# Patient Record
Sex: Male | Born: 1945
Health system: Southern US, Community
[De-identification: ages and names within clinical notes are randomized; demographics above are authoritative.]

## PROBLEM LIST (undated history)

## (undated) DIAGNOSIS — R569 Unspecified convulsions: Secondary | ICD-10-CM

## (undated) DIAGNOSIS — I2510 Atherosclerotic heart disease of native coronary artery without angina pectoris: Secondary | ICD-10-CM

## (undated) DIAGNOSIS — E7800 Pure hypercholesterolemia, unspecified: Secondary | ICD-10-CM

## (undated) DIAGNOSIS — M5030 Other cervical disc degeneration, unspecified cervical region: Secondary | ICD-10-CM

## (undated) DIAGNOSIS — B54 Unspecified malaria: Secondary | ICD-10-CM

## (undated) DIAGNOSIS — F4310 Post-traumatic stress disorder, unspecified: Secondary | ICD-10-CM

## (undated) DIAGNOSIS — Z95820 Peripheral vascular angioplasty status with implants and grafts: Secondary | ICD-10-CM

## (undated) DIAGNOSIS — I10 Essential (primary) hypertension: Secondary | ICD-10-CM

## (undated) DIAGNOSIS — I209 Angina pectoris, unspecified: Secondary | ICD-10-CM

## (undated) DIAGNOSIS — I214 Non-ST elevation (NSTEMI) myocardial infarction: Secondary | ICD-10-CM

## (undated) DIAGNOSIS — G450 Vertebro-basilar artery syndrome: Secondary | ICD-10-CM

## (undated) DIAGNOSIS — M1990 Unspecified osteoarthritis, unspecified site: Secondary | ICD-10-CM

## (undated) HISTORY — DX: Peripheral vascular angioplasty status with implants and grafts: Z95.820

## (undated) HISTORY — PX: TONSILLECTOMY: SUR1361

## (undated) HISTORY — PX: LUMBAR DISC SURGERY: SHX700

## (undated) HISTORY — PX: BACK SURGERY: SHX140

---

## 1989-07-07 HISTORY — PX: CATARACT EXTRACTION W/ INTRAOCULAR LENS  IMPLANT, BILATERAL: SHX1307

## 2005-06-28 ENCOUNTER — Emergency Department (HOSPITAL_COMMUNITY): Admission: EM | Admit: 2005-06-28 | Discharge: 2005-06-28 | Payer: Self-pay | Admitting: Emergency Medicine

## 2011-11-07 DIAGNOSIS — I214 Non-ST elevation (NSTEMI) myocardial infarction: Secondary | ICD-10-CM

## 2011-11-07 HISTORY — PX: CARDIAC CATHETERIZATION: SHX172

## 2011-11-07 HISTORY — DX: Non-ST elevation (NSTEMI) myocardial infarction: I21.4

## 2011-12-02 ENCOUNTER — Encounter (HOSPITAL_COMMUNITY): Payer: Self-pay

## 2011-12-02 ENCOUNTER — Emergency Department (HOSPITAL_COMMUNITY): Payer: Non-veteran care

## 2011-12-02 ENCOUNTER — Inpatient Hospital Stay (HOSPITAL_COMMUNITY): Payer: Non-veteran care

## 2011-12-02 ENCOUNTER — Encounter (HOSPITAL_COMMUNITY)
Admission: EM | Disposition: A | Discharge status: Home / Self Care | Payer: Self-pay | Source: Home / Self Care | Attending: Surgery

## 2011-12-02 ENCOUNTER — Other Ambulatory Visit: Payer: Self-pay

## 2011-12-02 ENCOUNTER — Inpatient Hospital Stay (HOSPITAL_COMMUNITY)
Admission: EM | Admit: 2011-12-02 | Discharge: 2011-12-06 | DRG: 234 | Disposition: A | Discharge status: Home / Self Care | Payer: Non-veteran care | Attending: Surgery | Admitting: Surgery

## 2011-12-02 ENCOUNTER — Encounter (HOSPITAL_COMMUNITY): Payer: Self-pay | Admitting: Emergency Medicine

## 2011-12-02 DIAGNOSIS — I214 Non-ST elevation (NSTEMI) myocardial infarction: Principal | ICD-10-CM | POA: Diagnosis present

## 2011-12-02 DIAGNOSIS — I219 Acute myocardial infarction, unspecified: Secondary | ICD-10-CM | POA: Diagnosis present

## 2011-12-02 DIAGNOSIS — I2510 Atherosclerotic heart disease of native coronary artery without angina pectoris: Secondary | ICD-10-CM | POA: Diagnosis present

## 2011-12-02 DIAGNOSIS — F17200 Nicotine dependence, unspecified, uncomplicated: Secondary | ICD-10-CM | POA: Diagnosis present

## 2011-12-02 DIAGNOSIS — R9431 Abnormal electrocardiogram [ECG] [EKG]: Secondary | ICD-10-CM | POA: Diagnosis present

## 2011-12-02 DIAGNOSIS — E8779 Other fluid overload: Secondary | ICD-10-CM | POA: Diagnosis not present

## 2011-12-02 DIAGNOSIS — Z87891 Personal history of nicotine dependence: Secondary | ICD-10-CM | POA: Diagnosis present

## 2011-12-02 DIAGNOSIS — E119 Type 2 diabetes mellitus without complications: Secondary | ICD-10-CM | POA: Diagnosis present

## 2011-12-02 DIAGNOSIS — E7800 Pure hypercholesterolemia, unspecified: Secondary | ICD-10-CM | POA: Diagnosis present

## 2011-12-02 DIAGNOSIS — Z79899 Other long term (current) drug therapy: Secondary | ICD-10-CM

## 2011-12-02 DIAGNOSIS — I10 Essential (primary) hypertension: Secondary | ICD-10-CM | POA: Diagnosis present

## 2011-12-02 DIAGNOSIS — E876 Hypokalemia: Secondary | ICD-10-CM | POA: Diagnosis not present

## 2011-12-02 DIAGNOSIS — D696 Thrombocytopenia, unspecified: Secondary | ICD-10-CM | POA: Diagnosis not present

## 2011-12-02 DIAGNOSIS — Z7982 Long term (current) use of aspirin: Secondary | ICD-10-CM

## 2011-12-02 DIAGNOSIS — I428 Other cardiomyopathies: Secondary | ICD-10-CM | POA: Diagnosis present

## 2011-12-02 DIAGNOSIS — I200 Unstable angina: Secondary | ICD-10-CM | POA: Diagnosis present

## 2011-12-02 DIAGNOSIS — I255 Ischemic cardiomyopathy: Secondary | ICD-10-CM | POA: Diagnosis present

## 2011-12-02 DIAGNOSIS — D62 Acute posthemorrhagic anemia: Secondary | ICD-10-CM | POA: Diagnosis not present

## 2011-12-02 DIAGNOSIS — Z951 Presence of aortocoronary bypass graft: Secondary | ICD-10-CM

## 2011-12-02 HISTORY — DX: Pure hypercholesterolemia, unspecified: E78.00

## 2011-12-02 HISTORY — PX: CORONARY ARTERY BYPASS GRAFT: SHX141

## 2011-12-02 HISTORY — PX: LEFT HEART CATHETERIZATION WITH CORONARY ANGIOGRAM: SHX5451

## 2011-12-02 HISTORY — DX: Essential (primary) hypertension: I10

## 2011-12-02 LAB — POCT I-STAT 4, (NA,K, GLUC, HGB,HCT)
Glucose, Bld: 122 mg/dL — ABNORMAL HIGH (ref 70–99)
Glucose, Bld: 120 mg/dL — ABNORMAL HIGH (ref 70–99)
Glucose, Bld: 142 mg/dL — ABNORMAL HIGH (ref 70–99)
HCT: 36 % — ABNORMAL LOW (ref 39.0–52.0)
HCT: 26 % — ABNORMAL LOW (ref 39.0–52.0)
HCT: 25 % — ABNORMAL LOW (ref 39.0–52.0)
HCT: 33 % — ABNORMAL LOW (ref 39.0–52.0)
Hemoglobin: 8.8 g/dL — ABNORMAL LOW (ref 13.0–17.0)
Hemoglobin: 8.5 g/dL — ABNORMAL LOW (ref 13.0–17.0)
Hemoglobin: 11.2 g/dL — ABNORMAL LOW (ref 13.0–17.0)
Potassium: 2.9 mEq/L — ABNORMAL LOW (ref 3.5–5.1)
Potassium: 2.8 mEq/L — ABNORMAL LOW (ref 3.5–5.1)
Potassium: 3.3 mEq/L — ABNORMAL LOW (ref 3.5–5.1)
Potassium: 3.7 mEq/L (ref 3.5–5.1)
Sodium: 138 mEq/L (ref 135–145)
Sodium: 140 mEq/L (ref 135–145)
Sodium: 140 mEq/L (ref 135–145)

## 2011-12-02 LAB — POCT I-STAT, CHEM 8
BUN: 26 mg/dL — ABNORMAL HIGH (ref 6–23)
BUN: 18 mg/dL (ref 6–23)
Chloride: 109 mEq/L (ref 96–112)
Chloride: 111 mEq/L (ref 96–112)
Creatinine, Ser: 1 mg/dL (ref 0.50–1.35)
Creatinine, Ser: 1 mg/dL (ref 0.50–1.35)
Creatinine, Ser: 0.8 mg/dL (ref 0.50–1.35)
Glucose, Bld: 118 mg/dL — ABNORMAL HIGH (ref 70–99)
Glucose, Bld: 126 mg/dL — ABNORMAL HIGH (ref 70–99)
HCT: 40 % (ref 39.0–52.0)
Hemoglobin: 13.6 g/dL (ref 13.0–17.0)
Potassium: 2.9 mEq/L — ABNORMAL LOW (ref 3.5–5.1)
Potassium: 2.9 mEq/L — ABNORMAL LOW (ref 3.5–5.1)
Potassium: 3.5 mEq/L (ref 3.5–5.1)
Sodium: 142 mEq/L (ref 135–145)
Sodium: 143 mEq/L (ref 135–145)

## 2011-12-02 LAB — CBC
HCT: 37.3 % — ABNORMAL LOW (ref 39.0–52.0)
MCH: 30.7 pg (ref 26.0–34.0)
MCH: 30.1 pg (ref 26.0–34.0)
MCHC: 36.5 g/dL — ABNORMAL HIGH (ref 30.0–36.0)
MCHC: 35.5 g/dL (ref 30.0–36.0)
MCHC: 34.8 g/dL (ref 30.0–36.0)
MCV: 86.7 fL (ref 78.0–100.0)
MCV: 86.6 fL (ref 78.0–100.0)
Platelets: 183 10*3/uL (ref 150–400)
Platelets: 123 10*3/uL — ABNORMAL LOW (ref 150–400)
RBC: 3.52 MIL/uL — ABNORMAL LOW (ref 4.22–5.81)
RDW: 14.9 % (ref 11.5–15.5)
RDW: 14.8 % (ref 11.5–15.5)
RDW: 14.9 % (ref 11.5–15.5)
WBC: 12 10*3/uL — ABNORMAL HIGH (ref 4.0–10.5)

## 2011-12-02 LAB — GLUCOSE, CAPILLARY
Glucose-Capillary: 110 mg/dL — ABNORMAL HIGH (ref 70–99)
Glucose-Capillary: 115 mg/dL — ABNORMAL HIGH (ref 70–99)

## 2011-12-02 LAB — POCT ACTIVATED CLOTTING TIME
Activated Clotting Time: 166 seconds
Activated Clotting Time: 237 seconds

## 2011-12-02 LAB — COMPREHENSIVE METABOLIC PANEL
ALT: 19 U/L (ref 0–53)
AST: 44 U/L — ABNORMAL HIGH (ref 0–37)
Alkaline Phosphatase: 76 U/L (ref 39–117)
GFR calc non Af Amer: 72 mL/min — ABNORMAL LOW (ref >90)
Sodium: 142 mEq/L (ref 135–145)
Total Bilirubin: 0.4 mg/dL (ref 0.3–1.2)

## 2011-12-02 LAB — DIFFERENTIAL
Basophils Absolute: 0 10*3/uL (ref 0.0–0.1)
Lymphocytes Relative: 16 % (ref 12–46)
Lymphs Abs: 1.9 10*3/uL (ref 0.7–4.0)
Neutro Abs: 9.4 10*3/uL — ABNORMAL HIGH (ref 1.7–7.7)

## 2011-12-02 LAB — PROTIME-INR
INR: 1.16 (ref 0.00–1.49)
INR: 1.5 — ABNORMAL HIGH (ref 0.00–1.49)
Prothrombin Time: 15 seconds (ref 11.6–15.2)
Prothrombin Time: 18.4 seconds — ABNORMAL HIGH (ref 11.6–15.2)

## 2011-12-02 LAB — PREPARE RBC (CROSSMATCH)

## 2011-12-02 LAB — POCT I-STAT TROPONIN I

## 2011-12-02 LAB — CREATININE, SERUM
Creatinine, Ser: 0.99 mg/dL (ref 0.50–1.35)
GFR calc non Af Amer: 85 mL/min — ABNORMAL LOW (ref >90)

## 2011-12-02 LAB — POCT I-STAT 3, ART BLOOD GAS (G3+)
Acid-base deficit: 3 mmol/L — ABNORMAL HIGH (ref 0.0–2.0)
Bicarbonate: 20.4 mEq/L (ref 20.0–24.0)
pCO2 arterial: 31.3 mmHg — ABNORMAL LOW (ref 35.0–45.0)
pO2, Arterial: 300 mmHg — ABNORMAL HIGH (ref 80.0–100.0)
pO2, Arterial: 89 mmHg (ref 80.0–100.0)

## 2011-12-02 LAB — PLATELET COUNT: Platelets: 107 10*3/uL — ABNORMAL LOW (ref 150–400)

## 2011-12-02 LAB — HEMOGLOBIN AND HEMATOCRIT, BLOOD: Hemoglobin: 7.8 g/dL — ABNORMAL LOW (ref 13.0–17.0)

## 2011-12-02 LAB — ABO/RH: ABO/RH(D): O POS

## 2011-12-02 SURGERY — CORONARY ARTERY BYPASS GRAFTING (CABG)
Anesthesia: General | Site: Chest | Wound class: Clean

## 2011-12-02 SURGERY — LEFT HEART CATHETERIZATION WITH CORONARY ANGIOGRAM
Anesthesia: LOCAL

## 2011-12-02 MED ORDER — ACETAMINOPHEN 325 MG PO TABS: 650.0000 mg | ORAL_TABLET | ORAL | Status: DC | PRN

## 2011-12-02 MED ORDER — DEXTROSE 5 % IV SOLN
1.5000 g | Freq: Two times a day (BID) | INTRAVENOUS | Status: DC
  Administered 2011-12-03 (×3): 1.5 g via INTRAVENOUS
  Filled 2011-12-02 (×4): qty 1.5

## 2011-12-02 MED ORDER — ETOMIDATE 2 MG/ML IV SOLN
INTRAVENOUS | Status: DC | PRN
  Administered 2011-12-02: 20 mg via INTRAVENOUS

## 2011-12-02 MED ORDER — DOPAMINE-DEXTROSE 3.2-5 MG/ML-% IV SOLN
2.0000 ug/kg/min | INTRAVENOUS | Status: DC
  Filled 2011-12-02: qty 250

## 2011-12-02 MED ORDER — OXYCODONE HCL 5 MG PO TABS
5.0000 mg | ORAL_TABLET | ORAL | Status: DC | PRN
Start: 2011-12-02 — End: 2011-12-04
  Administered 2011-12-03 (×2): 10 mg via ORAL
  Filled 2011-12-02 (×2): qty 2

## 2011-12-02 MED ORDER — DEXTROSE 5 % IV SOLN
750.0000 mg | INTRAVENOUS | Status: DC
  Filled 2011-12-02: qty 750

## 2011-12-02 MED ORDER — NITROGLYCERIN IN D5W 200-5 MCG/ML-% IV SOLN
2.0000 ug/min | INTRAVENOUS | Status: DC
  Filled 2011-12-02: qty 250

## 2011-12-02 MED ORDER — ONDANSETRON HCL 4 MG/2ML IJ SOLN
INTRAMUSCULAR | Status: AC
  Filled 2011-12-02: qty 2

## 2011-12-02 MED ORDER — ACETAMINOPHEN 650 MG RE SUPP: 650.0000 mg | RECTAL | Status: DC

## 2011-12-02 MED ORDER — METOPROLOL TARTRATE 12.5 MG HALF TABLET
12.5000 mg | ORAL_TABLET | Freq: Two times a day (BID) | ORAL | Status: DC
  Filled 2011-12-02 (×3): qty 1

## 2011-12-02 MED ORDER — AMINOCAPROIC ACID 250 MG/ML IV SOLN: INTRAVENOUS | Status: DC | PRN

## 2011-12-02 MED ORDER — BISACODYL 5 MG PO TBEC
10.0000 mg | DELAYED_RELEASE_TABLET | Freq: Every day | ORAL | Status: DC
  Administered 2011-12-04: 10 mg via ORAL
  Filled 2011-12-02: qty 2

## 2011-12-02 MED ORDER — INSULIN ASPART 100 UNIT/ML ~~LOC~~ SOLN: 0.0000 [IU] | SUBCUTANEOUS | Status: DC

## 2011-12-02 MED ORDER — HEPARIN SOD (PORCINE) IN D5W 100 UNIT/ML IV SOLN
1000.0000 [IU]/h | INTRAVENOUS | Status: DC
  Administered 2011-12-02: 1000 [IU]/h via INTRAVENOUS
  Filled 2011-12-02: qty 250

## 2011-12-02 MED ORDER — SODIUM CHLORIDE 0.9 % IV SOLN
10.0000 g | INTRAVENOUS | Status: DC | PRN
  Administered 2011-12-02: 5 g/h via INTRAVENOUS

## 2011-12-02 MED ORDER — VANCOMYCIN HCL 1000 MG IV SOLR
1000.0000 mg | INTRAVENOUS | Status: DC | PRN
  Administered 2011-12-02: 1500 mg via INTRAVENOUS

## 2011-12-02 MED ORDER — SODIUM CHLORIDE 0.9 % IV SOLN
INTRAVENOUS | Status: DC
  Administered 2011-12-02: 09:00:00 via INTRAVENOUS

## 2011-12-02 MED ORDER — NOREPINEPHRINE BITARTRATE 1 MG/ML IJ SOLN
2.0000 ug/min | INTRAVENOUS | Status: DC
  Administered 2011-12-03: 20 ug/min via INTRAVENOUS
  Filled 2011-12-02: qty 4

## 2011-12-02 MED ORDER — NITROGLYCERIN 0.2 MG/ML ON CALL CATH LAB
INTRAVENOUS | Status: AC
  Filled 2011-12-02: qty 1

## 2011-12-02 MED ORDER — 0.9 % SODIUM CHLORIDE (POUR BTL) OPTIME
TOPICAL | Status: DC | PRN
  Administered 2011-12-02: 5000 mL

## 2011-12-02 MED ORDER — PROTAMINE SULFATE 10 MG/ML IV SOLN
INTRAVENOUS | Status: DC | PRN
  Administered 2011-12-02: 300 mg via INTRAVENOUS

## 2011-12-02 MED ORDER — ALBUMIN HUMAN 5 % IV SOLN
INTRAVENOUS | Status: DC | PRN
  Administered 2011-12-02: 16:00:00 via INTRAVENOUS

## 2011-12-02 MED ORDER — HEPARIN SODIUM (PORCINE) 5000 UNIT/ML IJ SOLN
INTRAMUSCULAR | Status: AC
  Administered 2011-12-02: 5000 [IU]
  Filled 2011-12-02: qty 1

## 2011-12-02 MED ORDER — LACTATED RINGERS IV SOLN
INTRAVENOUS | Status: DC
  Administered 2011-12-02: 18:00:00 via INTRAVENOUS

## 2011-12-02 MED ORDER — ASPIRIN 81 MG PO CHEW: 324.0000 mg | CHEWABLE_TABLET | Freq: Every day | ORAL | Status: DC

## 2011-12-02 MED ORDER — PANTOPRAZOLE SODIUM 40 MG PO TBEC
40.0000 mg | DELAYED_RELEASE_TABLET | Freq: Every day | ORAL | Status: DC
  Administered 2011-12-04 – 2011-12-05 (×2): 40 mg via ORAL
  Filled 2011-12-02 (×2): qty 1

## 2011-12-02 MED ORDER — POTASSIUM CHLORIDE 10 MEQ/100ML IV SOLN
10.0000 meq | Freq: Once | INTRAVENOUS | Status: AC
  Administered 2011-12-02: 10 meq via INTRAVENOUS
  Filled 2011-12-02: qty 100

## 2011-12-02 MED ORDER — DOCUSATE SODIUM 100 MG PO CAPS
200.0000 mg | ORAL_CAPSULE | Freq: Every day | ORAL | Status: DC
  Administered 2011-12-04: 200 mg via ORAL
  Filled 2011-12-02 (×3): qty 2

## 2011-12-02 MED ORDER — MORPHINE SULFATE 2 MG/ML IJ SOLN
2.0000 mg | INTRAMUSCULAR | Status: DC | PRN
  Administered 2011-12-02: 2 mg via INTRAVENOUS
  Filled 2011-12-02: qty 1

## 2011-12-02 MED ORDER — EPINEPHRINE HCL 1 MG/ML IJ SOLN
0.5000 ug/min | INTRAMUSCULAR | Status: DC
  Filled 2011-12-02: qty 4

## 2011-12-02 MED ORDER — ACETAMINOPHEN 500 MG PO TABS
1000.0000 mg | ORAL_TABLET | Freq: Four times a day (QID) | ORAL | Status: DC
  Administered 2011-12-03 – 2011-12-04 (×4): 1000 mg via ORAL
  Filled 2011-12-02 (×8): qty 2
  Filled 2011-12-02: qty 1

## 2011-12-02 MED ORDER — SODIUM CHLORIDE 0.9 % IV SOLN
200.0000 ug | INTRAVENOUS | Status: DC | PRN
  Administered 2011-12-02: 0.3 ug/kg/h via INTRAVENOUS

## 2011-12-02 MED ORDER — ACETAMINOPHEN 160 MG/5ML PO SOLN
975.0000 mg | Freq: Four times a day (QID) | ORAL | Status: DC
  Filled 2011-12-02: qty 40.6

## 2011-12-02 MED ORDER — POTASSIUM CHLORIDE 10 MEQ/50ML IV SOLN
10.0000 meq | INTRAVENOUS | Status: AC
  Administered 2011-12-02 (×3): 10 meq via INTRAVENOUS

## 2011-12-02 MED ORDER — SODIUM CHLORIDE 0.9 % IV SOLN
0.1000 ug/kg/h | INTRAVENOUS | Status: DC
  Filled 2011-12-02: qty 4

## 2011-12-02 MED ORDER — ACETAMINOPHEN 160 MG/5ML PO SOLN: 650.0000 mg | ORAL | Status: DC

## 2011-12-02 MED ORDER — MORPHINE SULFATE 4 MG/ML IJ SOLN
2.0000 mg | INTRAMUSCULAR | Status: DC | PRN
  Administered 2011-12-03 (×4): 4 mg via INTRAVENOUS
  Filled 2011-12-02 (×4): qty 1

## 2011-12-02 MED ORDER — ASPIRIN 81 MG PO CHEW
324.0000 mg | CHEWABLE_TABLET | Freq: Once | ORAL | Status: AC
  Administered 2011-12-02: 324 mg via ORAL
  Filled 2011-12-02: qty 4

## 2011-12-02 MED ORDER — NITROGLYCERIN IN D5W 200-5 MCG/ML-% IV SOLN
INTRAVENOUS | Status: DC | PRN
  Administered 2011-12-02: 16.6 ug/min via INTRAVENOUS

## 2011-12-02 MED ORDER — FAMOTIDINE IN NACL 20-0.9 MG/50ML-% IV SOLN
20.0000 mg | Freq: Two times a day (BID) | INTRAVENOUS | Status: AC
  Administered 2011-12-02 (×2): 20 mg via INTRAVENOUS
  Filled 2011-12-02: qty 50

## 2011-12-02 MED ORDER — MIDAZOLAM HCL 5 MG/5ML IJ SOLN
INTRAMUSCULAR | Status: DC | PRN
  Administered 2011-12-02: 2 mg via INTRAVENOUS
  Administered 2011-12-02: 1 mg via INTRAVENOUS
  Administered 2011-12-02: 5 mg via INTRAVENOUS
  Administered 2011-12-02: 2 mg via INTRAVENOUS

## 2011-12-02 MED ORDER — SODIUM CHLORIDE 0.9 % IJ SOLN: 3.0000 mL | INTRAMUSCULAR | Status: DC | PRN

## 2011-12-02 MED ORDER — POTASSIUM CHLORIDE 2 MEQ/ML IV SOLN
80.0000 meq | INTRAVENOUS | Status: DC
  Filled 2011-12-02: qty 40

## 2011-12-02 MED ORDER — ASPIRIN EC 325 MG PO TBEC
325.0000 mg | DELAYED_RELEASE_TABLET | Freq: Every day | ORAL | Status: DC
  Administered 2011-12-04 – 2011-12-06 (×3): 325 mg via ORAL
  Filled 2011-12-02 (×4): qty 1

## 2011-12-02 MED ORDER — PHENYLEPHRINE HCL 10 MG/ML IJ SOLN
10.0000 mg | INTRAVENOUS | Status: DC | PRN
  Administered 2011-12-02: 25 ug/min via INTRAVENOUS

## 2011-12-02 MED ORDER — HEPARIN (PORCINE) IN NACL 2-0.9 UNIT/ML-% IJ SOLN
INTRAMUSCULAR | Status: AC
  Filled 2011-12-02: qty 1000

## 2011-12-02 MED ORDER — SODIUM CHLORIDE 0.9 % IV BOLUS (SEPSIS)
250.0000 mL | Freq: Once | INTRAVENOUS | Status: AC
  Administered 2011-12-02: 250 mL via INTRAVENOUS

## 2011-12-02 MED ORDER — METOPROLOL TARTRATE 1 MG/ML IV SOLN: 2.5000 mg | INTRAVENOUS | Status: DC | PRN

## 2011-12-02 MED ORDER — POTASSIUM CHLORIDE 10 MEQ/50ML IV SOLN
INTRAVENOUS | Status: AC
  Administered 2011-12-03: 10 meq
  Filled 2011-12-02: qty 50

## 2011-12-02 MED ORDER — HEPARIN SODIUM (PORCINE) 1000 UNIT/ML IJ SOLN
5000.0000 [IU] | Freq: Once | INTRAMUSCULAR | Status: AC
  Filled 2011-12-02: qty 5

## 2011-12-02 MED ORDER — MORPHINE SULFATE 2 MG/ML IJ SOLN: 1.0000 mg | INTRAMUSCULAR | Status: DC | PRN

## 2011-12-02 MED ORDER — INSULIN REGULAR BOLUS VIA INFUSION: 0.0000 [IU] | Freq: Three times a day (TID) | INTRAVENOUS | Status: DC

## 2011-12-02 MED ORDER — LACTATED RINGERS IV SOLN
INTRAVENOUS | Status: DC | PRN
  Administered 2011-12-02: 12:00:00 via INTRAVENOUS

## 2011-12-02 MED ORDER — SODIUM CHLORIDE 0.9 % IV SOLN: INTRAVENOUS | Status: AC

## 2011-12-02 MED ORDER — PHENYLEPHRINE HCL 10 MG/ML IJ SOLN
30.0000 ug/min | INTRAVENOUS | Status: DC
  Filled 2011-12-02: qty 2

## 2011-12-02 MED ORDER — DEXTROSE 5 % IV SOLN
1.5000 g | INTRAVENOUS | Status: DC
  Filled 2011-12-02: qty 1.5

## 2011-12-02 MED ORDER — ONDANSETRON HCL 4 MG/2ML IJ SOLN
4.0000 mg | INTRAMUSCULAR | Status: DC | PRN
  Administered 2011-12-02: 4 mg via INTRAVENOUS
  Filled 2011-12-02: qty 2

## 2011-12-02 MED ORDER — VERAPAMIL HCL 2.5 MG/ML IV SOLN
INTRAVENOUS | Status: DC | PRN
  Administered 2011-12-02: 15:00:00

## 2011-12-02 MED ORDER — ONDANSETRON HCL 4 MG/2ML IJ SOLN: 4.0000 mg | Freq: Four times a day (QID) | INTRAMUSCULAR | Status: DC | PRN

## 2011-12-02 MED ORDER — MORPHINE SULFATE 2 MG/ML IJ SOLN
1.0000 mg | INTRAMUSCULAR | Status: AC | PRN
  Administered 2011-12-03: 2 mg via INTRAVENOUS
  Filled 2011-12-02: qty 2

## 2011-12-02 MED ORDER — ONDANSETRON HCL 4 MG/2ML IJ SOLN
INTRAMUSCULAR | Status: DC | PRN
  Administered 2011-12-02 (×2): 4 mg via INTRAVENOUS

## 2011-12-02 MED ORDER — BISACODYL 10 MG RE SUPP: 10.0000 mg | Freq: Every day | RECTAL | Status: DC

## 2011-12-02 MED ORDER — LIDOCAINE HCL (PF) 1 % IJ SOLN
INTRAMUSCULAR | Status: AC
  Filled 2011-12-02: qty 30

## 2011-12-02 MED ORDER — NITROGLYCERIN IN D5W 200-5 MCG/ML-% IV SOLN: 0.0000 ug/min | INTRAVENOUS | Status: DC

## 2011-12-02 MED ORDER — FENTANYL CITRATE 0.05 MG/ML IJ SOLN
INTRAMUSCULAR | Status: AC
  Filled 2011-12-02: qty 2

## 2011-12-02 MED ORDER — MIDAZOLAM HCL 2 MG/2ML IJ SOLN
2.0000 mg | INTRAMUSCULAR | Status: DC | PRN
  Administered 2011-12-02 – 2011-12-03 (×2): 2 mg via INTRAVENOUS
  Filled 2011-12-02 (×3): qty 2

## 2011-12-02 MED ORDER — METOPROLOL TARTRATE 25 MG/10 ML ORAL SUSPENSION
12.5000 mg | Freq: Two times a day (BID) | ORAL | Status: DC
  Filled 2011-12-02 (×3): qty 5

## 2011-12-02 MED ORDER — SODIUM CHLORIDE 0.9 % IV SOLN: INTRAVENOUS | Status: DC

## 2011-12-02 MED ORDER — 0.9 % SODIUM CHLORIDE (POUR BTL) OPTIME
TOPICAL | Status: DC | PRN
  Administered 2011-12-02: 1000 mL

## 2011-12-02 MED ORDER — HEPARIN SODIUM (PORCINE) 1000 UNIT/ML IJ SOLN
INTRAMUSCULAR | Status: DC | PRN
  Administered 2011-12-02: 20 [IU] via INTRAVENOUS
  Administered 2011-12-02: 30000 [IU] via INTRAVENOUS

## 2011-12-02 MED ORDER — ROCURONIUM BROMIDE 100 MG/10ML IV SOLN
INTRAVENOUS | Status: DC | PRN
  Administered 2011-12-02: 100 mg via INTRAVENOUS
  Administered 2011-12-02: 50 mg via INTRAVENOUS

## 2011-12-02 MED ORDER — HEMOSTATIC AGENTS (NO CHARGE) OPTIME
TOPICAL | Status: DC | PRN
  Administered 2011-12-02: 1 via TOPICAL

## 2011-12-02 MED ORDER — SODIUM CHLORIDE 0.9 % IV SOLN
INTRAVENOUS | Status: DC | PRN
  Administered 2011-12-02: 16:00:00 via INTRAVENOUS

## 2011-12-02 MED ORDER — DEXTROSE 5 % IV SOLN
1.5000 g | INTRAVENOUS | Status: DC | PRN
  Administered 2011-12-02: 1.5 g via INTRAVENOUS

## 2011-12-02 MED ORDER — POTASSIUM CHLORIDE 10 MEQ/50ML IV SOLN
INTRAVENOUS | Status: AC
  Administered 2011-12-02: 10 meq
  Filled 2011-12-02: qty 50

## 2011-12-02 MED ORDER — SODIUM CHLORIDE 0.9 % IV SOLN
INTRAVENOUS | Status: DC
  Filled 2011-12-02: qty 1

## 2011-12-02 MED ORDER — MORPHINE SULFATE 10 MG/ML IJ SOLN
INTRAMUSCULAR | Status: DC | PRN
  Administered 2011-12-02 (×2): 5 mg via INTRAVENOUS

## 2011-12-02 MED ORDER — SODIUM CHLORIDE 0.9 % IV SOLN
0.1000 ug/kg/h | INTRAVENOUS | Status: DC
  Administered 2011-12-02 – 2011-12-03 (×3): 0.7 ug/kg/h via INTRAVENOUS
  Filled 2011-12-02 (×3): qty 2

## 2011-12-02 MED ORDER — THROMBIN 20000 UNITS EX KIT
PACK | CUTANEOUS | Status: DC | PRN
  Administered 2011-12-02: 15:00:00 via TOPICAL

## 2011-12-02 MED ORDER — MAGNESIUM SULFATE 40 MG/ML IJ SOLN
4.0000 g | Freq: Once | INTRAMUSCULAR | Status: AC
  Administered 2011-12-02: 4 g via INTRAVENOUS
  Filled 2011-12-02: qty 100

## 2011-12-02 MED ORDER — SODIUM CHLORIDE 0.9 % IV SOLN
INTRAVENOUS | Status: DC
  Filled 2011-12-02: qty 40

## 2011-12-02 MED ORDER — SODIUM CHLORIDE 0.9 % IV SOLN: 250.0000 mL | INTRAVENOUS | Status: DC

## 2011-12-02 MED ORDER — VERAPAMIL HCL 2.5 MG/ML IV SOLN
INTRAVENOUS | Status: DC
  Filled 2011-12-02: qty 2.5

## 2011-12-02 MED ORDER — SODIUM CHLORIDE 0.9 % IV SOLN
INTRAVENOUS | Status: DC
  Administered 2011-12-02: 1.8 [IU]/h via INTRAVENOUS

## 2011-12-02 MED ORDER — NITROGLYCERIN 0.4 MG SL SUBL
0.4000 mg | SUBLINGUAL_TABLET | SUBLINGUAL | Status: DC | PRN
  Administered 2011-12-02 (×2): 0.4 mg via SUBLINGUAL
  Filled 2011-12-02 (×2): qty 25

## 2011-12-02 MED ORDER — SODIUM CHLORIDE 0.9 % IV SOLN
1500.0000 mg | INTRAVENOUS | Status: DC
  Filled 2011-12-02: qty 1500

## 2011-12-02 MED ORDER — ONDANSETRON HCL 4 MG/2ML IJ SOLN
4.0000 mg | Freq: Four times a day (QID) | INTRAMUSCULAR | Status: DC | PRN
  Administered 2011-12-03 (×2): 4 mg via INTRAVENOUS
  Filled 2011-12-02 (×2): qty 2

## 2011-12-02 MED ORDER — MAGNESIUM SULFATE 50 % IJ SOLN
40.0000 meq | INTRAMUSCULAR | Status: DC
  Filled 2011-12-02: qty 10

## 2011-12-02 MED ORDER — INSULIN ASPART 100 UNIT/ML ~~LOC~~ SOLN
0.0000 [IU] | SUBCUTANEOUS | Status: AC
  Administered 2011-12-03: 2 [IU] via SUBCUTANEOUS
  Filled 2011-12-02: qty 3

## 2011-12-02 MED ORDER — SODIUM CHLORIDE 0.9 % IJ SOLN: 3.0000 mL | Freq: Two times a day (BID) | INTRAMUSCULAR | Status: DC

## 2011-12-02 MED ORDER — SODIUM CHLORIDE 0.9 % IV SOLN
100.0000 [IU] | INTRAVENOUS | Status: DC | PRN
  Administered 2011-12-02: 1.9 [IU]/h via INTRAVENOUS

## 2011-12-02 MED ORDER — SODIUM CHLORIDE 0.45 % IV SOLN
INTRAVENOUS | Status: DC
  Administered 2011-12-02: 18:00:00 via INTRAVENOUS

## 2011-12-02 MED ORDER — LACTATED RINGERS IV SOLN: 500.0000 mL | Freq: Once | INTRAVENOUS | Status: AC | PRN

## 2011-12-02 MED ORDER — PHENYLEPHRINE HCL 10 MG/ML IJ SOLN
0.0000 ug/min | INTRAVENOUS | Status: DC
  Administered 2011-12-02: 25 ug/min via INTRAVENOUS
  Administered 2011-12-03: 30 ug/min via INTRAVENOUS
  Filled 2011-12-02: qty 2

## 2011-12-02 MED ORDER — FENTANYL CITRATE 0.05 MG/ML IJ SOLN
INTRAMUSCULAR | Status: DC | PRN
  Administered 2011-12-02 (×2): 250 ug via INTRAVENOUS
  Administered 2011-12-02: 100 ug via INTRAVENOUS
  Administered 2011-12-02: 250 ug via INTRAVENOUS
  Administered 2011-12-02: 150 ug via INTRAVENOUS
  Administered 2011-12-02: 250 ug via INTRAVENOUS
  Administered 2011-12-02: 100 ug via INTRAVENOUS

## 2011-12-02 MED ORDER — VANCOMYCIN HCL 1000 MG IV SOLR
1000.0000 mg | Freq: Once | INTRAVENOUS | Status: AC
  Administered 2011-12-03: 1000 mg via INTRAVENOUS
  Filled 2011-12-02: qty 1000

## 2011-12-02 MED ORDER — ALBUMIN HUMAN 5 % IV SOLN
250.0000 mL | INTRAVENOUS | Status: AC | PRN
  Administered 2011-12-02 – 2011-12-03 (×3): 250 mL via INTRAVENOUS
  Filled 2011-12-02: qty 250

## 2011-12-02 SURGICAL SUPPLY — 110 items
ADAPTER CARDIO PERF ANTE/RETRO (ADAPTER) IMPLANT
ATTRACTOMAT 16X20 MAGNETIC DRP (DRAPES) ×2 IMPLANT
BAG DECANTER FOR FLEXI CONT (MISCELLANEOUS) ×2 IMPLANT
BANDAGE ELASTIC 4 VELCRO ST LF (GAUZE/BANDAGES/DRESSINGS) ×2 IMPLANT
BANDAGE ELASTIC 6 VELCRO ST LF (GAUZE/BANDAGES/DRESSINGS) ×2 IMPLANT
BANDAGE GAUZE ELAST BULKY 4 IN (GAUZE/BANDAGES/DRESSINGS) ×2 IMPLANT
BASKET HEART (ORDER IN 25'S) (MISCELLANEOUS) ×1
BASKET HEART (ORDER IN 25S) (MISCELLANEOUS) ×1 IMPLANT
BLADE SAW STERNAL (BLADE) ×2 IMPLANT
BLADE SURG 11 STRL SS (BLADE) ×2 IMPLANT
BLADE SURG ROTATE 9660 (MISCELLANEOUS) IMPLANT
CANISTER SUCTION 2500CC (MISCELLANEOUS) ×2 IMPLANT
CANNULA GUNDRY RCSP 15FR (MISCELLANEOUS) IMPLANT
CATH ROBINSON RED A/P 18FR (CATHETERS) ×4 IMPLANT
CATH THORACIC 28FR (CATHETERS) ×2 IMPLANT
CATH THORACIC 28FR RT ANG (CATHETERS) IMPLANT
CATH THORACIC 36FR (CATHETERS) ×2 IMPLANT
CATH THORACIC 36FR RT ANG (CATHETERS) ×2 IMPLANT
CLIP FOGARTY SPRING 6M (CLIP) IMPLANT
CLIP RETRACTION 3.0MM CORONARY (MISCELLANEOUS) ×2 IMPLANT
CLIP TI MEDIUM 24 (CLIP) IMPLANT
CLIP TI WIDE RED SMALL 24 (CLIP) ×2 IMPLANT
CLOTH BEACON ORANGE TIMEOUT ST (SAFETY) ×2 IMPLANT
COVER SURGICAL LIGHT HANDLE (MISCELLANEOUS) ×4 IMPLANT
CRADLE DONUT ADULT HEAD (MISCELLANEOUS) ×2 IMPLANT
DRAPE CARDIOVASCULAR INCISE (DRAPES) ×1
DRAPE PROXIMA HALF (DRAPES) ×2 IMPLANT
DRAPE SLUSH MACHINE 52X66 (DRAPES) IMPLANT
DRAPE SLUSH/WARMER DISC (DRAPES) ×2 IMPLANT
DRAPE SRG 135X102X78XABS (DRAPES) ×1 IMPLANT
DRSG COVADERM 4X14 (GAUZE/BANDAGES/DRESSINGS) ×2 IMPLANT
ELECT CAUTERY BLADE 6.4 (BLADE) ×2 IMPLANT
ELECT REM PT RETURN 9FT ADLT (ELECTROSURGICAL) ×4
ELECTRODE REM PT RTRN 9FT ADLT (ELECTROSURGICAL) ×2 IMPLANT
GLOVE BIO SURGEON STRL SZ 6 (GLOVE) ×4 IMPLANT
GLOVE BIO SURGEON STRL SZ 6.5 (GLOVE) ×4 IMPLANT
GLOVE BIO SURGEON STRL SZ7 (GLOVE) IMPLANT
GLOVE BIO SURGEON STRL SZ7.5 (GLOVE) IMPLANT
GLOVE BIOGEL PI IND STRL 6 (GLOVE) ×2 IMPLANT
GLOVE BIOGEL PI IND STRL 6.5 (GLOVE) IMPLANT
GLOVE BIOGEL PI IND STRL 7.0 (GLOVE) ×3 IMPLANT
GLOVE BIOGEL PI INDICATOR 6 (GLOVE) ×2
GLOVE BIOGEL PI INDICATOR 6.5 (GLOVE)
GLOVE BIOGEL PI INDICATOR 7.0 (GLOVE) ×3
GLOVE EUDERMIC 7 POWDERFREE (GLOVE) ×4 IMPLANT
GLOVE ORTHO TXT STRL SZ7.5 (GLOVE) IMPLANT
GLOVE SURG EUDERMIC 8 LTX PF (GLOVE) ×4 IMPLANT
GOWN PREVENTION PLUS XLARGE (GOWN DISPOSABLE) ×4 IMPLANT
GOWN STRL NON-REIN LRG LVL3 (GOWN DISPOSABLE) ×8 IMPLANT
HEMOSTAT POWDER SURGIFOAM 1G (HEMOSTASIS) ×6 IMPLANT
HEMOSTAT SURGICEL 2X14 (HEMOSTASIS) ×2 IMPLANT
INSERT FOGARTY 61MM (MISCELLANEOUS) IMPLANT
INSERT FOGARTY XLG (MISCELLANEOUS) IMPLANT
KIT BASIN OR (CUSTOM PROCEDURE TRAY) ×2 IMPLANT
KIT CATH CPB BARTLE (MISCELLANEOUS) ×2 IMPLANT
KIT ROOM TURNOVER OR (KITS) ×2 IMPLANT
KIT SUCTION CATH 14FR (SUCTIONS) ×2 IMPLANT
KIT VASOVIEW W/TROCAR VH 2000 (KITS) ×2 IMPLANT
NS IRRIG 1000ML POUR BTL (IV SOLUTION) ×12 IMPLANT
PACK OPEN HEART (CUSTOM PROCEDURE TRAY) ×2 IMPLANT
PAD ARMBOARD 7.5X6 YLW CONV (MISCELLANEOUS) ×4 IMPLANT
PENCIL BUTTON HOLSTER BLD 10FT (ELECTRODE) ×2 IMPLANT
PUNCH AORTIC ROTATE 4.0MM (MISCELLANEOUS) ×2 IMPLANT
PUNCH AORTIC ROTATE 4.5MM 8IN (MISCELLANEOUS) IMPLANT
PUNCH AORTIC ROTATE 5MM 8IN (MISCELLANEOUS) IMPLANT
SET CARDIOPLEGIA MPS 5001102 (MISCELLANEOUS) ×2 IMPLANT
SOLUTION ANTI FOG 6CC (MISCELLANEOUS) IMPLANT
SPONGE GAUZE 4X4 12PLY (GAUZE/BANDAGES/DRESSINGS) ×4 IMPLANT
SPONGE INTESTINAL PEANUT (DISPOSABLE) IMPLANT
SPONGE LAP 18X18 X RAY DECT (DISPOSABLE) ×2 IMPLANT
SPONGE LAP 4X18 X RAY DECT (DISPOSABLE) ×2 IMPLANT
SUT BONE WAX W31G (SUTURE) ×2 IMPLANT
SUT MNCRL AB 4-0 PS2 18 (SUTURE) ×2 IMPLANT
SUT PROLENE 3 0 SH DA (SUTURE) IMPLANT
SUT PROLENE 3 0 SH1 36 (SUTURE) IMPLANT
SUT PROLENE 4 0 RB 1 (SUTURE)
SUT PROLENE 4 0 SH DA (SUTURE) IMPLANT
SUT PROLENE 4-0 RB1 .5 CRCL 36 (SUTURE) IMPLANT
SUT PROLENE 5 0 C 1 36 (SUTURE) IMPLANT
SUT PROLENE 6 0 C 1 30 (SUTURE) IMPLANT
SUT PROLENE 6 0 CC (SUTURE) IMPLANT
SUT PROLENE 7 0 BV 1 (SUTURE) IMPLANT
SUT PROLENE 7 0 BV1 MDA (SUTURE) ×2 IMPLANT
SUT PROLENE 7.0 RB 3 (SUTURE) ×2 IMPLANT
SUT PROLENE 8 0 BV175 6 (SUTURE) IMPLANT
SUT SILK  1 MH (SUTURE)
SUT SILK 1 MH (SUTURE) IMPLANT
SUT SILK 2 0 SH CR/8 (SUTURE) IMPLANT
SUT SILK 3 0 SH CR/8 (SUTURE) IMPLANT
SUT STEEL STERNAL CCS#1 18IN (SUTURE) IMPLANT
SUT STEEL SZ 6 DBL 3X14 BALL (SUTURE) ×6 IMPLANT
SUT VIC AB 1 CTX 36 (SUTURE) ×2
SUT VIC AB 1 CTX36XBRD ANBCTR (SUTURE) ×2 IMPLANT
SUT VIC AB 2-0 CT1 27 (SUTURE) ×1
SUT VIC AB 2-0 CT1 TAPERPNT 27 (SUTURE) ×1 IMPLANT
SUT VIC AB 2-0 CTX 27 (SUTURE) IMPLANT
SUT VIC AB 3-0 SH 27 (SUTURE)
SUT VIC AB 3-0 SH 27X BRD (SUTURE) IMPLANT
SUT VIC AB 3-0 X1 27 (SUTURE) IMPLANT
SUT VICRYL 4-0 PS2 18IN ABS (SUTURE) ×2 IMPLANT
SUTURE E-PAK OPEN HEART (SUTURE) ×2 IMPLANT
SYSTEM SAHARA CHEST DRAIN ATS (WOUND CARE) ×2 IMPLANT
TAPE CLOTH SURG 4X10 WHT LF (GAUZE/BANDAGES/DRESSINGS) ×4 IMPLANT
TOWEL OR 17X24 6PK STRL BLUE (TOWEL DISPOSABLE) ×2 IMPLANT
TOWEL OR 17X26 10 PK STRL BLUE (TOWEL DISPOSABLE) ×2 IMPLANT
TRAY FOLEY IC TEMP SENS 14FR (CATHETERS) ×2 IMPLANT
TUBE SUCT INTRACARD DLP 20F (MISCELLANEOUS) ×2 IMPLANT
TUBING INSUFFLATION 10FT LAP (TUBING) ×2 IMPLANT
UNDERPAD 30X30 INCONTINENT (UNDERPADS AND DIAPERS) ×2 IMPLANT
WATER STERILE IRR 1000ML POUR (IV SOLUTION) ×4 IMPLANT

## 2011-12-02 NOTE — Progress Notes (Signed)
   Pt was reexamined and existing H & P reviewed. No changes found.  Runell Gess, MD [facility] 12/02/2011 11:14 AM

## 2011-12-02 NOTE — Consult Note (Signed)
[address]            [name] [address]          [phone]      Reason for Consult:99% ostial left main with STEMI Referring Physician: Axcel, [name].  HPI: He presents with a several day history of stuttering chest pain.  ECG on presentation showed marked anterolateral ST depression and cardiac enzymes were positive.  Code STEMI called and cath shows 99% ostial Left Main, moderate mid LAD stenosis, and moderate RCA stenosis. LVgram shows anteroapical hypokinesis. IABP inserted for anatomy and ongoing chest pain.  Past Medical History  Diagnosis Date  . Diabetes mellitus   . Hypertension   . Hypercholesterolemia     Past Surgical History  Procedure Date  . Back surgery     History reviewed. No pertinent family history.  Social History:  reports that he has been smoking.  He does not have any smokeless tobacco history on file. He reports that he does not drink alcohol or use illicit drugs.  Allergies: No Known Allergies  Medications:  I have reviewed the patient's current medications. Prior to Admission:  No prescriptions prior to admission   Scheduled:   . aminocaproic acid (AMICAR) for OHS   Intravenous To OR  . aspirin  324 mg Oral Once  . cefUROXime (ZINACEF)  IV  1.5 g Intravenous To OR  . cefUROXime (ZINACEF)  IV  750 mg Intravenous To OR  . dexmedetomidine (PRECEDEX) IV infusion for high rates  0.1-0.7 mcg/kg/hr Intravenous To OR  . DOPamine  2-20 mcg/kg/min Intravenous To OR  . epinephrine  0.5-20 mcg/min Intravenous To OR  . fentaNYL      . heparin      . heparin      . heparin      . heparin  5,000 Units Intravenous Once  . insulin (NOVOLIN-R) infusion   Intravenous To OR  . lidocaine      . magnesium sulfate  40 mEq Other To OR  . nitroGLYCERIN      . nitroGLYCERIN  2-200 mcg/min Intravenous To OR  . nitroglycerin/verapamil/heparin/sodium bicarbonate solution irrigation for  artery spasm   Irrigation To OR  . ondansetron      . phenylephrine (NEO-SYNEPHRINE) Adult infusion  30-200 mcg/min Intravenous To OR  . potassium chloride  10 mEq Intravenous Once  . potassium chloride  80 mEq Other To OR  . sodium chloride  250 mL Intravenous Once  . sodium chloride  250 mL Intravenous Once  . vancomycin  1,500 mg Intravenous To OR   Continuous:   . sodium chloride 50 mL/hr at 12/02/11 0912  . heparin 1,000 Units/hr (12/02/11 1025)   XBM:[id number] injection, nitroGLYCERIN, ondansetron  Results for orders placed during the hospital encounter of 12/02/11 (from the past 48 hour(s))  POCT I-STAT TROPONIN I     Status: Abnormal   Collection Time   12/02/11  9:23 AM      Component Value Range Comment   Troponin i, poc 0.97 (*) 0.00 - 0.08 (ng/mL)    Comment NOTIFIED PHYSICIAN      Comment 3            POCT I-STAT, CHEM 8     Status: Abnormal   Collection Time   12/02/11  9:25 AM  Component Value Range Comment   Sodium 143  135 - 145 (mEq/L)    Potassium 2.9 (*) 3.5 - 5.1 (mEq/L)    Chloride 109  96 - 112 (mEq/L)    BUN 26 (*) 6 - 23 (mg/dL)    Creatinine, Ser 1.19  0.50 - 1.35 (mg/dL)    Glucose, Bld 147 (*) 70 - 99 (mg/dL)    Calcium, Ion 8.29  1.12 - 1.32 (mmol/L)    TCO2 22  0 - 100 (mmol/L)    Hemoglobin 13.6  13.0 - 17.0 (g/dL)    HCT 56.2  13.0 - 86.5 (%)   CBC     Status: Abnormal   Collection Time   12/02/11  9:41 AM      Component Value Range Comment   WBC 12.0 (*) 4.0 - 10.5 (K/uL)    RBC 4.30  4.22 - 5.81 (MIL/uL)    Hemoglobin 13.6  13.0 - 17.0 (g/dL)    HCT 78.4 (*) 69.6 - 52.0 (%)    MCV 86.7  78.0 - 100.0 (fL)    MCH 31.6  26.0 - 34.0 (pg)    MCHC 36.5 (*) 30.0 - 36.0 (g/dL)    RDW 29.5  28.4 - 13.2 (%)    Platelets 183  150 - 400 (K/uL)   DIFFERENTIAL     Status: Abnormal   Collection Time   12/02/11  9:41 AM      Component Value Range Comment   Neutrophils Relative 78 (*) 43 - 77 (%)    Neutro Abs 9.4 (*) 1.7 - 7.7 (K/uL)     Lymphocytes Relative 16  12 - 46 (%)    Lymphs Abs 1.9  0.7 - 4.0 (K/uL)    Monocytes Relative 7  3 - 12 (%)    Monocytes Absolute 0.8  0.1 - 1.0 (K/uL)    Eosinophils Relative 0  0 - 5 (%)    Eosinophils Absolute 0.0  0.0 - 0.7 (K/uL)    Basophils Relative 0  0 - 1 (%)    Basophils Absolute 0.0  0.0 - 0.1 (K/uL)   COMPREHENSIVE METABOLIC PANEL     Status: Abnormal   Collection Time   12/02/11  9:41 AM      Component Value Range Comment   Sodium 142  135 - 145 (mEq/L)    Potassium 2.9 (*) 3.5 - 5.1 (mEq/L)    Chloride 106  96 - 112 (mEq/L)    CO2 20  19 - 32 (mEq/L)    Glucose, Bld 113 (*) 70 - 99 (mg/dL)    BUN 26 (*) 6 - 23 (mg/dL)    Creatinine, Ser 4.40  0.50 - 1.35 (mg/dL)    Calcium 9.3  8.4 - 10.5 (mg/dL)    Total Protein 6.9  6.0 - 8.3 (g/dL)    Albumin 3.5  3.5 - 5.2 (g/dL)    AST 44 (*) 0 - 37 (U/L)    ALT 19  0 - 53 (U/L)    Alkaline Phosphatase 76  39 - 117 (U/L)    Total Bilirubin 0.4  0.3 - 1.2 (mg/dL)    GFR calc non Af Amer 72 (*) >90 (mL/min)    GFR calc Af Amer 84 (*) >90 (mL/min)   PROTIME-INR     Status: Normal   Collection Time   12/02/11  9:41 AM      Component Value Range Comment   Prothrombin Time 15.0  11.6 - 15.2 (seconds)    INR  1.16  0.00 - 1.49    APTT     Status: Normal   Collection Time   12/02/11  9:41 AM      Component Value Range Comment   aPTT 30  24 - 37 (seconds)   TYPE AND SCREEN     Status: Normal   Collection Time   12/02/11 11:28 AM      Component Value Range Comment   ABO/RH(D) O POS      Antibody Screen NEG      Sample Expiration 12/05/2011     PREPARE RBC (CROSSMATCH)     Status: Normal   Collection Time   12/02/11 11:28 AM      Component Value Range Comment   Order Confirmation ORDER PROCESSED BY BLOOD BANK     ABO/RH     Status: Normal   Collection Time   12/02/11 11:28 AM      Component Value Range Comment   ABO/RH(D) O POS       Dg Chest Port 1 View  12/02/2011  *RADIOLOGY REPORT*  Clinical Data: Chest pain, CHF,  infiltrate.  PORTABLE CHEST - 1 VIEW  Comparison: Abdominal CT 06/28/2005.  Findings: 0910 hours.  The heart size and mediastinal contours are normal.  The lungs are clear.  There is no pleural effusion or pneumothorax.  Scattered thoracic spine osteophytes are noted. Telemetry leads overlie the chest.  IMPRESSION: No active cardiopulmonary process.  Original Report Authenticated By: Gerrianne Scale, M.D.    Review of Systems  Constitutional: Positive for malaise/fatigue.  HENT: Negative.   Eyes: Negative.   Respiratory: Positive for shortness of breath.   Cardiovascular: Positive for chest pain and orthopnea. Negative for palpitations, claudication, leg swelling and PND.  Gastrointestinal: Negative.   Genitourinary: Negative.   Musculoskeletal: Negative.   Skin: Negative.   Neurological: Negative.   Endo/Heme/Allergies: Negative.   Psychiatric/Behavioral: Negative.    Blood pressure 107/68, pulse 80, temperature 97.6 F (36.4 C), resp. rate 15, height 5' 10.5" (1.791 m), weight 83.915 kg (185 lb), SpO2 97.00%. Physical Exam  Constitutional: He is oriented to person, place, and time. He appears well-developed and well-nourished.  HENT:  Head: Normocephalic and atraumatic.  Eyes: EOM are normal. Pupils are equal, round, and reactive to light.  Neck: No JVD present. No thyromegaly present.  Cardiovascular: Normal rate, regular rhythm, normal heart sounds and intact distal pulses.  Exam reveals no gallop and no friction rub.   No murmur heard. Respiratory: Effort normal and breath sounds normal.  GI: Soft. Bowel sounds are normal. He exhibits no mass. There is no tenderness.  Musculoskeletal: He exhibits no edema.  Lymphadenopathy:    He has no cervical adenopathy.  Neurological: He is alert and oriented to person, place, and time.  Skin: Skin is warm and dry.  Psychiatric: He has a normal mood and affect.   Cardiac Cath: HEMODYNAMICS:  AO SYSTOLIC/AO DIASTOLIC: 112/64  LV  SYSTOLIC/LV DIASTOLIC: 107/33  ANGIOGRAPHIC RESULTS:  1. Left main; 99% ostial  2. LAD; 80% mid  3. Left circumflex; nondominant and normal.  4. Right coronary artery; dominant with scattered 40-50% lockage is in the midportion  5. Left ventriculography; RAO left ventriculogram was performed using  25 mL of Visipaque dye at 12 mL/second. The overall LVEF estimated  40 % With/ wall motion abnormalities.there was moderate hypokinesia of the anterior wall and apex  IMPRESSION:[name] has high-grade ostial left main stenosis with mid LAD disease and moderate LV dysfunction. He will  need emergency coronary artery bypass grafting. Intra-aortic pump was inserted to the right groin for hemodynamic support. To [name] from [facility] was consulted. The patient left the lab in stable condition with intra-aortic balloon counterpulsation on the way to the operating room for bypass surgery.  [name], [facility]   Assessment/Plan:  Tight ostial left main stenosis with acute STEMI. Agree with need for emergent CABG. I discussed the operative procedure with the patient and family including alternatives, benefits and risks; including but not limited to bleeding, blood transfusion, infection, stroke, myocardial infarction, graft failure, heart block requiring a permanent pacemaker, organ dysfunction, and death.  [name] understands and agrees to proceed. Heart team has been called and plan surgery emergently.  Alleen Borne 12/02/2011, 12:32 PM

## 2011-12-02 NOTE — ED Notes (Signed)
MD at bedside. Dr Myrtis Ser at the bedside for cardiology. stemi called and protocols initiated for the same.

## 2011-12-02 NOTE — Op Note (Signed)
[name] is a 66 y.o. male    [id number] LOCATION:  FACILITY: [facility]  PHYSICIAN: Nanetta Batty, M.D. Mar 08, 1946   DATE OF PROCEDURE:  12/02/2011  DATE OF DISCHARGE:  [facility]  CARDIAC CATHETERIZATION     History obtained from chart review. Patient is a 66 year old married Caucasian male positive crepitus factors are developed chest pain consistent with unstable angina 2 days ago which was waxing and waning until today. He presented to [facility] ER where he was found to have 5-6 mm of diffuse ST segment depression. He was brought emergently to the Cath Lab for cardiac catheterization to define his anatomy.   PROCEDURE DESCRIPTION:    The patient was brought to the second floor  [facility] cath lab in the postabsorptive state. He was not   Premedicated.Marland Kitchen His right groinwas prepped and shaved in usual sterile fashion. Xylocaine 1% was used for local anesthesia. A 6 French sheath was inserted into the right common femoral artery using standard Seldinger technique. 6 French right and left Judkins diagnostic catheters along with a 6 French pigtail catheter were used for selective coronary angiography,left ventriculography and distal abdominal aortography. Visipaque dye was used for the entirety of the case. Retrograde aortic and left ventricular pullback pressures were recorded.    HEMODYNAMICS:    AO SYSTOLIC/AO DIASTOLIC: 112/64   LV SYSTOLIC/LV DIASTOLIC: 107/33  ANGIOGRAPHIC RESULTS:   1. Left main; 99% ostial  2. LAD; 80% mid 3. Left circumflex; nondominant and normal.  4. Right coronary artery; dominant with scattered 40-50% lockage is in the midportion 5. Left ventriculography; RAO left ventriculogram was performed using  25 mL  of Visipaque dye at 12 mL/second. The overall LVEF estimated  40 %  With/ wall motion abnormalities.there was moderate hypokinesia of the anterior wall and apex  IMPRESSION:[name] has  high-grade ostial left main stenosis with mid LAD disease and moderate LV dysfunction. He will need emergency coronary artery bypass grafting. Intra-aortic pump was inserted to the right groin for hemodynamic support. To [name] from [facility] was consulted. The patient left the lab in stable condition with intra-aortic balloon counterpulsation on the way to the operating room for bypass surgery.  [name], [facility] 12/02/2011 11:58 AM

## 2011-12-02 NOTE — Op Note (Signed)
[name], EMBLETON NO.:  [id number]  MEDICAL RECORD NO.:  [id number]  LOCATION:  2302                         FACILITY:  [facility]  PHYSICIAN:  [name], M.D.     DATE OF BIRTH:  02-08-1946  DATE OF PROCEDURE:  12/02/2011 DATE OF DISCHARGE:                              OPERATIVE REPORT   PREOPERATIVE DIAGNOSIS:  High-grade ostial left main coronary stenosis with acute ST-segment elevation myocardial infarction.  POSTOPERATIVE DIAGNOSIS:  High-grade ostial left main coronary stenosis with acute ST-segment elevation myocardial infarction.  OPERATIVE PROCEDURE:  Emergency median sternotomy, extracorporeal circulation, coronary artery bypass graft surgery x3 using a left internal mammary artery graft to left anterior descending coronary, with a saphenous vein graft to the obtuse marginal branch of left circumflex coronary artery, and a saphenous vein graft to the right coronary artery.  Endoscopic vein harvesting from the right leg.  ATTENDING SURGEON:  [name], M.D.  ASSISTANT:  Al Corpus, [facility].  CLINICAL HISTORY:  This patient is a 66 year old gentleman with a history of hypertension, diabetes, hyperlipidemia, and smoking, who presented with a several day history of stuttering chest pain.  At the time of presentation this morning, he was noted to have marked ST- segment depression in the anterolateral leads with positive cardiac enzymes and was taken to Cath Lab as a code STEMI.  Cardiac catheterization showed a 99% ostial left main stenosis.  There was about 70-80% proximal to mid LAD stenosis.  The LAD was a large vessel distally.  The left circumflex had a single large marginal vessel that trifurcated into 3 subbranches.  The right coronary artery had diffuse proximal and mid vessel disease with up to about 60% stenosis.  This was a dominant vessel.  Left ventricular function was moderately depressed with marked anteroapical hypokinesis.  The  patient remained hemodynamically stable, but continued to have some chest pain.  In the cath lab, he had an intra-aortic balloon pump placed by Cardiology.  I was called by the Cath Lab to evaluate the patient.  I felt that emergent coronary artery bypass surgery was the best treatment.  I discussed the operative procedure with the patient and his family including alternatives, benefits, and risks including but not limited to bleeding, blood transfusion, infection, stroke, myocardial infarction, graft failure, organ dysfunction, and death.  He understood all of this and agreed to proceed.  OPERATIVE PROCEDURE:  The patient was taken to the operative room and placed on table in supine position.  After induction of general endotracheal anesthesia, a Foley catheter placed in bladder using a sterile technique.  Then, the chest, abdomen, and both lower extremities were prepped and draped in usual sterile manner.  Chest was entered through a median sternotomy incision and the pericardium opened in the midline.  Exam of the heart showed good ventricular contractility.  The ascending aorta had some palpable plaque in it distally just proximal to the innominate artery.  The remainder of the aorta felt mildly thickened, but softer.  The left internal mammary artery was then harvested from the chest wall as a pedicle graft.  This is a medium caliber vessel with excellent blood flow through it.  At the same time, a segment of greater saphenous vein was harvested from the right leg using endoscopic vein harvest technique.  This vein was of medium size and good quality.  Then, the patient was heparinized when adequate ACT was obtained.  The distal ascending aorta was cannulated using a 20-French aortic cannula for arterial inflow.  Venous outflow was achieved using a 2-stage venous cannula for the right atrial appendage.  An antegrade cardioplegia and vent cannula was inserted in aortic root.  The  patient was placed on cardiopulmonary bypass and the distal coronary arteries were identified.  The LAD was intramyocardial throughout most of its extent and exited to the surface of the heart just at the apex. The obtuse marginal branch was visible in its proximal portion then became intramyocardial.  This was a large graftable vessel with no significant disease proximally.  The right coronary artery had diffuse plaque throughout its proximal and midportions.  Just proximal to the posterior descending branch, the vessel was free of disease and suitable for grafting.  Then, the aorta was crossclamped and 500 mL of cold blood antegrade cardioplegia was administered in the aortic root with quick arrest of the heart.  Systemic hypothermia to 32 degrees centigrade and topical hypothermic iced saline was used.  A temperature probe was placed in septum, insulating pad in the pericardium.  The first distal anastomosis was then performed to the obtuse marginal branch.  The internal diameter of this vessel was about 2-2.5 mm proximally.  The conduit used was a segment of greater saphenous vein and anastomosis performed in an end-to-side manner using continuous 7-0 Prolene suture.  Flow was noted through the graft and was excellent.  The second distal anastomosis was performed in the right coronary artery.  The internal diameter of this vessel just proximal to the posterior descending branch was about 2.5 mm.  Conduit used was a second segment of greater saphenous vein and the anastomosis performed in an end-to-side manner using continuous 7-0 suture.  Flow was noted through the graft and was excellent.  Then another dose of cardioplegia was given down the vein grafts and aortic root.  The third distal anastomosis was then performed to the mid LAD.  I was able to locate the vessel within the myocardium.  This was a large vessel with no disease in the midportion.  The internal diameter  was about 2.5 mm.  The conduit used was a left internal mammary graft and was brought through an opening of the left pericardium anterior to the phrenic nerve.  It was anastomosed to LAD in an end-to-side manner using continuous 8-0 Prolene suture.  The pedicle was sutured to the epicardium 6-0 Prolene sutures.  Then, with the crossclamp in place, the 2 proximal vein graft anastomoses were performed to the mid ascending aorta in an end-to-side manner using continuous 6-0 Prolene suture. Then, the clamp was removed from the mammary pedicle.  There was rapid warming of the ventricular septum and return of spontaneous ventricular fibrillation.  The crossclamp was removed with time of 54 minutes and the patient spontaneously converted to sinus rhythm.  The proximal and distal anastomoses appeared hemostatic and allowed the grafts satisfactory.  Graft markers were placed around the proximal anastomoses.  Two temporary right ventricular and right atrial pacing wires were placed and brought out through the skin.  When the patient was rewarmed to 37 degrees centigrade, he was weaned from cardiopulmonary bypass on no inotropic agents with the intra-aortic balloon pump at 1:1.  Cardiac function appeared excellent with cardiac output of 4-6 L/minute.  Protamine was given, and the venous and aortic cannulas removed without difficulty.  Hemostasis was achieved.  Three chest tubes were placed with two in the post-pericardium, one into the left pleural space, one into the anterior mediastinum.  The sternum was then closed with double #6 stainless steel wires.  The fascia was closed with continuous 1 Vicryl suture.  Subcutaneous tissue was closed with continuous 2-0 Vicryl.  The skin with a 3-0 Vicryl subcuticular closure. The lower extremity vein-harvest site was closed in layers in similar manner.  The sponge, needle, instrument counts were correct according to the scrub nurse.  Dry sterile dressing  applied over the incisions around the chest tubes, which were hooked to Pleur-Evac suction.  The patient remained hemodynamically stable and transferred to the SICU in guarded, but stable condition.     [name], M.D.     BB/MEDQ  D:  12/02/2011  T:  12/02/2011  Job:  [id number]

## 2011-12-02 NOTE — Anesthesia Preprocedure Evaluation (Addendum)
Anesthesia Evaluation  Patient identified by MRN, date of birth, ID band Patient awake    Reviewed: Allergy & Precautions, H&P , NPO status , Patient's Chart, lab work & pertinent test results  History of Anesthesia Complications Negative for: history of anesthetic complications  Airway Mallampati: I TM Distance: >3 FB Neck ROM: Full    Dental  (+) Edentulous Upper and Edentulous Lower   Pulmonary  clear to auscultation        Cardiovascular hypertension, Regular Normal    Neuro/Psych    GI/Hepatic   Endo/Other  Diabetes mellitus-  Renal/GU      Musculoskeletal   Abdominal   Peds  Hematology   Anesthesia Other Findings   Reproductive/Obstetrics                          Anesthesia Physical Anesthesia Plan  ASA: IV and Emergent  Anesthesia Plan: General   Post-op Pain Management:    Induction: Intravenous  Airway Management Planned: Oral ETT  Additional Equipment: Arterial line, CVP and PA Cath  Intra-op Plan:   Post-operative Plan: Post-operative intubation/ventilation  Informed Consent: I have reviewed the patients History and Physical, chart, labs and discussed the procedure including the risks, benefits and alternatives for the proposed anesthesia with the patient or authorized representative who has indicated his/her understanding and acceptance.   Only emergency history available and Dental advisory given  Plan Discussed with: CRNA, Anesthesiologist and Surgeon  Anesthesia Plan Comments:        Anesthesia Quick Evaluation

## 2011-12-02 NOTE — Anesthesia Postprocedure Evaluation (Signed)
  Anesthesia Post-op Note  Patient: [name]  Procedure(s) Performed:  CORONARY ARTERY BYPASS GRAFTING (CABG)  Patient Location: SICU  Anesthesia Type: General  Level of Consciousness: sedated  Airway and Oxygen Therapy: Patient remains intubated per anesthesia plan and Patient placed on Ventilator (see vital sign flow sheet for setting)  Post-op Pain: none  Post-op Assessment: Post-op Vital signs reviewed, Patient's Cardiovascular Status Stable, Respiratory Function Stable, Patent Airway, No signs of Nausea or vomiting and Pain level controlled  Post-op Vital Signs: Reviewed and stable  Complications: No apparent anesthesia complications

## 2011-12-02 NOTE — Progress Notes (Addendum)
ANTICOAGULATION CONSULT NOTE - Initial Consult  Pharmacy Consult for Heparin Indication: chest pain/ACS  No Known Allergies  Patient Measurements: Height: 5' 10.5" (179.1 cm) Weight: 185 lb (83.915 kg) IBW/kg (Calculated) : 74.15   Vital Signs: Temp: 97.6 F (36.4 C) (01/26 0840) BP: 97/64 mmHg (01/26 1000) Pulse Rate: 68  (01/26 1000)  Labs:  Basename 12/02/11 0941 12/02/11 0925  HGB 13.6 13.6  HCT 37.3* 40.0  PLT 183 --  APTT -- --  LABPROT -- --  INR -- --  HEPARINUNFRC -- --  CREATININE -- 1.00  CKTOTAL -- --  CKMB -- --  TROPONINI -- --   Estimated Creatinine Clearance: 78.3 ml/min (by C-G formula based on Cr of 1).  Medical History: Past Medical History  Diagnosis Date  . Diabetes mellitus   . Hypertension   . Hypercholesterolemia    Medications:  Scheduled:    . aspirin  324 mg Oral Once  . heparin      . heparin  5,000 Units Intravenous Once  . potassium chloride  10 mEq Intravenous Once  . sodium chloride  250 mL Intravenous Once  . sodium chloride  250 mL Intravenous Once   Infusions:    . sodium chloride 50 mL/hr at 12/02/11 0912   PRN: morphine injection, nitroGLYCERIN, ondansetron  Assessment: [name] is a 66 year old male with CC of chest pain to be started on heparin. Noted that 5000 units bolus was given in ED. Usual bolus is 4000 units.  CBC ok. Plts 183. No complaints of bleeding.   Goal of Therapy:  Heparin level 0.3-0.7 units/ml   Plan:  1. Heparin drip of 1000 units/hr = 65ml/hr 2. F/u heparin dosing after cath   3. Order daily HL and CBC  Thank you,  [name], PharmD Pager: [phone]  12/02/2011 10:10 AM

## 2011-12-02 NOTE — ED Notes (Signed)
Wife stated, he's been having CP since Thurs after he left [employer],  He had to sit in his car for 45 minl. Before he could move.

## 2011-12-02 NOTE — ED Notes (Signed)
Gave pt a urinal and a urine cup with a label so we can collect a sample. 9:02 am JG

## 2011-12-02 NOTE — H&P (Signed)
    Pt was reexamined and existing H & P reviewed. No changes found.  Runell Gess, MD [facility] 12/02/2011 11:57 AM

## 2011-12-02 NOTE — ED Provider Notes (Signed)
History     CSN: [id number]  Arrival date & time 12/02/11  6578   Chief Complaint  Patient presents with  . Chest Pain    The history is provided by the patient and the spouse. History Limited By: urgent need for intervention.  Pt was seen at 0855.  Pt seen on arrival to ED exam room.  Pt and spouse, c/o gradual onset and worsening of persistent mid-sternal chest "pain" x2 days.  Pt states at one point he had brief relief of his symptoms, but they have now been constant "for the past 36 hours."  Denies vomiting/diarrhea, no fevers, no SOB/cough, no back pain.   PMD:  V.A.  Past Medical History  Diagnosis Date  . Diabetes mellitus   . Hypertension   . Hypercholesterolemia     Past Surgical History  Procedure Date  . Back surgery     History  Substance Use Topics  . Smoking status: Current Everyday Smoker  . Smokeless tobacco: Not on file  . Alcohol Use: No    Review of Systems  Unable to perform ROS: Other    Allergies  Review of patient's allergies indicates no known allergies.  Home Medications  No current outpatient prescriptions on file.  BP 98/62  Pulse 75  Temp 97.6 F (36.4 C)  Resp 27  SpO2 100%  Physical Exam 0900: Physical examination:  Nursing notes reviewed; Vital signs and O2 SAT reviewed;  Constitutional: Well developed, Well nourished, uncomfortable appearing; Head:  Normocephalic, atraumatic; Eyes: EOMI, PERRL, No scleral icterus; ENMT: Mouth and pharynx normal, Mucous membranes dry; Neck: Supple, Full range of motion, No lymphadenopathy; Cardiovascular: Regular rate and rhythm, No murmur, rub, or gallop; Respiratory: Breath sounds clear & equal bilaterally, No rales, rhonchi, wheezes, or rub, Normal respiratory effort/excursion; Chest: Nontender, Movement normal; Abdomen: Soft, Nontender, Nondistended, Normal bowel sounds;  Extremities: Pulses normal, No tenderness, No edema, No calf edema or asymmetry.; Neuro: AA&Ox3, Major CN grossly intact. No  facial droop, speech clear. No gross focal motor or sensory deficits in extremities.; Skin: Color normal, Warm, Dry, no rash.    ED Course  Procedures   [phone]:  EKG with significant ST depressions in ant-inf leads, no acute ST elevations.  Will dose ASA, ntg sl and morphine for CP.  Will need Cards admit for ACS/NSTEMI.   0925:  No change in pt's discomfort after the 1st ntg SL.  2nd ntg Sl and morphine given to pt with symptoms starting to improve.  SBP dropped to 86, now c/o nausea.  Will give IVF bolus and zofran.  T/C to Cards Dr. Myrtis Ser, case discussed, including:  HPI, pertinent PM/SHx, VS/PE, dx testing, ED course and treatment.  Agreeable to admit.  Requests to continue IVF for hypotension, give potassium, IV heparin 5000unit bolus followed by heparin gtt pharm to consult.  Orders written ED RN aware.   2952:  Cards Dr. Myrtis Ser at bedside, agrees EKG is concerning, will take him emergently to cath lab now.    MDM  MDM Reviewed: nursing note and vitals Interpretation: ECG, labs and x-ray Total time providing critical care: 30-74 minutes. This excludes time spent performing separately reportable procedures and services. Consults: cardiology   CRITICAL CARE Performed by: Laray Anger Total critical care time: 35 Critical care time was exclusive of separately billable procedures and treating other patients. Critical care was necessary to treat or prevent imminent or life-threatening deterioration. Critical care was time spent personally by me on the following activities: development of  treatment plan with patient and/or surrogate as well as nursing, discussions with consultants, evaluation of patient's response to treatment, examination of patient, obtaining history from patient or surrogate, ordering and performing treatments and interventions, ordering and review of laboratory studies, ordering and review of radiographic studies, pulse oximetry and re-evaluation of patient's  condition.    Date: 12/02/2011  Rate: 87  Rhythm: normal sinus rhythm  QRS Axis: normal  Intervals: QT prolonged  ST/T Wave abnormalities: ST depressions inferiorly and ST depressions anteriorly  Conduction Disutrbances:none  Narrative Interpretation:   Old EKG Reviewed: none available.  Results for orders placed during the hospital encounter of 12/02/11  POCT I-STAT, CHEM 8      Component Value Range   Sodium 143  135 - 145 (mEq/L)   Potassium 2.9 (*) 3.5 - 5.1 (mEq/L)   Chloride 109  96 - 112 (mEq/L)   BUN 26 (*) 6 - 23 (mg/dL)   Creatinine, Ser 1.61  0.50 - 1.35 (mg/dL)   Glucose, Bld 096 (*) 70 - 99 (mg/dL)   Calcium, Ion 0.45  4.09 - 1.32 (mmol/L)   TCO2 22  0 - 100 (mmol/L)   Hemoglobin 13.6  13.0 - 17.0 (g/dL)   HCT 81.1  91.4 - 78.2 (%)  POCT I-STAT TROPONIN I      Component Value Range   Troponin i, poc 0.97 (*) 0.00 - 0.08 (ng/mL)   Comment NOTIFIED PHYSICIAN     Comment 3             Dg Chest Port 1 View 12/02/2011  *RADIOLOGY REPORT*  Clinical Data: Chest pain, CHF, infiltrate.  PORTABLE CHEST - 1 VIEW  Comparison: Abdominal CT 06/28/2005.  Findings: 0910 hours.  The heart size and mediastinal contours are normal.  The lungs are clear.  There is no pleural effusion or pneumothorax.  Scattered thoracic spine osteophytes are noted. Telemetry leads overlie the chest.  IMPRESSION: No active cardiopulmonary process.  Original Report Authenticated By: Gerrianne Scale, M.D.           [facility] [address], DO 12/03/11 1918

## 2011-12-02 NOTE — ED Notes (Signed)
Patient taken to cath lab accompanied by wife.

## 2011-12-02 NOTE — Progress Notes (Signed)
The pt's wife, son and daughter were w/pt when I arrived. I introduced myself and asked if there is anything I could do for them. Mrs. Everton asked me to pray and I did. At the end of the prayer the nurse instructed the family that two could visit. We later met the remainder of the family on the 2nd floor and I took them to the 2900 waiting room area. [name] December 02, 2011

## 2011-12-02 NOTE — ED Notes (Signed)
MD Katz at bedside.

## 2011-12-02 NOTE — Brief Op Note (Signed)
12/02/2011  4:30 PM  PATIENT:  [name]  66 y.o. male  PRE-OPERATIVE DIAGNOSIS:  Coronary artery disease  POST-OPERATIVE DIAGNOSIS:  Coronary artery disease  PROCEDURE:  Procedure(s):  EMERGENT CORONARY ARTERY BYPASS GRAFTING (CABG) X 3:  LIMA TO LAD, SVG TO OM, SVG TO RCA.  SURGEON:  Surgeon(s): Alleen Borne, MD  PHYSICIAN ASSISTANT: none   ASSISTANTS: Al Corpus, [facility]   ANESTHESIA:   general  EBL:  Total I/O In: 4120 [I.V.:3200; Blood:670; IV Piggyback:250] Out: 705 [Urine:705]  BLOOD ADMINISTERED:none  Off bypass on no inotropes, IABP  To SICU, stable

## 2011-12-02 NOTE — Transfer of Care (Signed)
Immediate Anesthesia Transfer of Care Note  Patient: [name]  Procedure(s) Performed:  CORONARY ARTERY BYPASS GRAFTING (CABG)  Patient Location: SICU  Anesthesia Type: General  Level of Consciousness: sedated and unresponsive  Airway & Oxygen Therapy: Patient remains intubated per anesthesia plan  Post-op Assessment: Post -op Vital signs reviewed and stable  Post vital signs: Reviewed and stable  Complications: No apparent anesthesia complications

## 2011-12-03 ENCOUNTER — Inpatient Hospital Stay (HOSPITAL_COMMUNITY): Payer: Non-veteran care

## 2011-12-03 LAB — GLUCOSE, CAPILLARY
Glucose-Capillary: 107 mg/dL — ABNORMAL HIGH (ref 70–99)
Glucose-Capillary: 113 mg/dL — ABNORMAL HIGH (ref 70–99)
Glucose-Capillary: 119 mg/dL — ABNORMAL HIGH (ref 70–99)
Glucose-Capillary: 161 mg/dL — ABNORMAL HIGH (ref 70–99)

## 2011-12-03 LAB — PREPARE PLATELET PHERESIS: Unit division: 0

## 2011-12-03 LAB — CBC
Hemoglobin: 10.3 g/dL — ABNORMAL LOW (ref 13.0–17.0)
MCH: 30.1 pg (ref 26.0–34.0)
MCH: 30.1 pg (ref 26.0–34.0)
MCHC: 34.6 g/dL (ref 30.0–36.0)
MCV: 87.1 fL (ref 78.0–100.0)
Platelets: 117 10*3/uL — ABNORMAL LOW (ref 150–400)
Platelets: 124 10*3/uL — ABNORMAL LOW (ref 150–400)
RBC: 3.42 MIL/uL — ABNORMAL LOW (ref 4.22–5.81)
RBC: 3.46 MIL/uL — ABNORMAL LOW (ref 4.22–5.81)
WBC: 18.7 10*3/uL — ABNORMAL HIGH (ref 4.0–10.5)

## 2011-12-03 LAB — BASIC METABOLIC PANEL
CO2: 21 mEq/L (ref 19–32)
Calcium: 7.4 mg/dL — ABNORMAL LOW (ref 8.4–10.5)
Creatinine, Ser: 0.92 mg/dL (ref 0.50–1.35)
GFR calc non Af Amer: 87 mL/min — ABNORMAL LOW (ref >90)
Glucose, Bld: 140 mg/dL — ABNORMAL HIGH (ref 70–99)
Sodium: 143 mEq/L (ref 135–145)

## 2011-12-03 LAB — POCT I-STAT 3, ART BLOOD GAS (G3+)
Acid-base deficit: 3 mmol/L — ABNORMAL HIGH (ref 0.0–2.0)
Bicarbonate: 22.2 mEq/L (ref 20.0–24.0)
Patient temperature: 38.1

## 2011-12-03 LAB — POCT I-STAT, CHEM 8
HCT: 32 % — ABNORMAL LOW (ref 39.0–52.0)
Hemoglobin: 10.9 g/dL — ABNORMAL LOW (ref 13.0–17.0)
Potassium: 3.7 mEq/L (ref 3.5–5.1)
Sodium: 142 mEq/L (ref 135–145)

## 2011-12-03 LAB — CREATININE, SERUM
Creatinine, Ser: 0.99 mg/dL (ref 0.50–1.35)
GFR calc non Af Amer: 85 mL/min — ABNORMAL LOW (ref >90)

## 2011-12-03 LAB — MAGNESIUM: Magnesium: 2.4 mg/dL (ref 1.5–2.5)

## 2011-12-03 MED ORDER — PROMETHAZINE HCL 25 MG/ML IJ SOLN: 12.5000 mg | Freq: Four times a day (QID) | INTRAMUSCULAR | Status: DC | PRN

## 2011-12-03 MED ORDER — POTASSIUM CHLORIDE 10 MEQ/50ML IV SOLN
10.0000 meq | INTRAVENOUS | Status: AC | PRN
  Administered 2011-12-03 (×3): 10 meq via INTRAVENOUS

## 2011-12-03 MED ORDER — INSULIN ASPART 100 UNIT/ML ~~LOC~~ SOLN
0.0000 [IU] | SUBCUTANEOUS | Status: DC
  Administered 2011-12-03: 4 [IU] via SUBCUTANEOUS
  Administered 2011-12-03: 2 [IU] via SUBCUTANEOUS

## 2011-12-03 MED ORDER — METOCLOPRAMIDE HCL 5 MG/ML IJ SOLN
5.0000 mg | Freq: Four times a day (QID) | INTRAMUSCULAR | Status: DC
  Administered 2011-12-03 – 2011-12-04 (×3): 5 mg via INTRAVENOUS
  Filled 2011-12-03 (×9): qty 1

## 2011-12-03 MED ORDER — ENOXAPARIN SODIUM 40 MG/0.4ML ~~LOC~~ SOLN
40.0000 mg | Freq: Every day | SUBCUTANEOUS | Status: DC
  Administered 2011-12-03: 40 mg via SUBCUTANEOUS
  Filled 2011-12-03 (×2): qty 0.4

## 2011-12-03 MED ORDER — NOREPINEPHRINE BITARTRATE 1 MG/ML IJ SOLN
2.0000 ug/min | INTRAVENOUS | Status: DC
  Administered 2011-12-03: 10 ug/min via INTRAVENOUS
  Filled 2011-12-03: qty 8

## 2011-12-03 MED ORDER — POTASSIUM CHLORIDE 10 MEQ/50ML IV SOLN: 10.0000 meq | INTRAVENOUS | Status: DC

## 2011-12-03 NOTE — Progress Notes (Signed)
Subjective:  Still has chest wall pain, slightly lethargic  Objective:  Temp:  [97.6 F (36.4 C)-100.6 F (38.1 C)] 98.8 F (37.1 C) (01/27 0800) Pulse Rate:  [29-91] 89  (01/27 0800) Resp:  [11-27] 21  (01/27 0800) BP: (77-111)/(43-74) 77/59 mmHg (01/27 0800) SpO2:  [97 %-100 %] 99 % (01/27 0800) Arterial Line BP: (74-139)/(32-71) 90/51 mmHg (01/27 0800) FiO2 (%):  [40 %-50 %] 40 % (01/27 0522) Weight:  [83.915 kg (185 lb)-84.7 kg (186 lb 11.7 oz)] 84.7 kg (186 lb 11.7 oz) (01/27 0452) Weight change:   Intake/Output from previous day: 01/26 0701 - 01/27 0700 In: 5766.4 [I.V.:4026.4; Blood:670; IV Piggyback:1070] Out: 2925 [Urine:1850; Blood:755; Chest Tube:320]  Intake/Output from this shift:    Physical Exam: General appearance: alert and cooperative Neck: no adenopathy, no carotid bruit, no JVD, supple, symmetrical, trachea midline and thyroid not enlarged, symmetric, no tenderness/mass/nodules Lungs: clear to auscultation bilaterally Heart: friction rub heard left precordium Extremities: extremities normal, atraumatic, no cyanosis or edema  Lab Results: Results for orders placed during the hospital encounter of 12/02/11 (from the past 48 hour(s))  POCT I-STAT TROPONIN I     Status: Abnormal   Collection Time   12/02/11  9:23 AM      Component Value Range Comment   Troponin i, poc 0.97 (*) 0.00 - 0.08 (ng/mL)    Comment NOTIFIED PHYSICIAN      Comment 3            POCT I-STAT, CHEM 8     Status: Abnormal   Collection Time   12/02/11  9:25 AM      Component Value Range Comment   Sodium 143  135 - 145 (mEq/L)    Potassium 2.9 (*) 3.5 - 5.1 (mEq/L)    Chloride 109  96 - 112 (mEq/L)    BUN 26 (*) 6 - 23 (mg/dL)    Creatinine, Ser 1.61  0.50 - 1.35 (mg/dL)    Glucose, Bld 096 (*) 70 - 99 (mg/dL)    Calcium, Ion 0.45  1.12 - 1.32 (mmol/L)    TCO2 22  0 - 100 (mmol/L)    Hemoglobin 13.6  13.0 - 17.0 (g/dL)    HCT 40.9  81.1 - 91.4 (%)   CBC     Status: Abnormal   Collection Time   12/02/11  9:41 AM      Component Value Range Comment   WBC 12.0 (*) 4.0 - 10.5 (K/uL)    RBC 4.30  4.22 - 5.81 (MIL/uL)    Hemoglobin 13.6  13.0 - 17.0 (g/dL)    HCT 78.2 (*) 95.6 - 52.0 (%)    MCV 86.7  78.0 - 100.0 (fL)    MCH 31.6  26.0 - 34.0 (pg)    MCHC 36.5 (*) 30.0 - 36.0 (g/dL)    RDW 21.3  08.6 - 57.8 (%)    Platelets 183  150 - 400 (K/uL)   DIFFERENTIAL     Status: Abnormal   Collection Time   12/02/11  9:41 AM      Component Value Range Comment   Neutrophils Relative 78 (*) 43 - 77 (%)    Neutro Abs 9.4 (*) 1.7 - 7.7 (K/uL)    Lymphocytes Relative 16  12 - 46 (%)    Lymphs Abs 1.9  0.7 - 4.0 (K/uL)    Monocytes Relative 7  3 - 12 (%)    Monocytes Absolute 0.8  0.1 - 1.0 (K/uL)    Eosinophils  Relative 0  0 - 5 (%)    Eosinophils Absolute 0.0  0.0 - 0.7 (K/uL)    Basophils Relative 0  0 - 1 (%)    Basophils Absolute 0.0  0.0 - 0.1 (K/uL)   COMPREHENSIVE METABOLIC PANEL     Status: Abnormal   Collection Time   12/02/11  9:41 AM      Component Value Range Comment   Sodium 142  135 - 145 (mEq/L)    Potassium 2.9 (*) 3.5 - 5.1 (mEq/L)    Chloride 106  96 - 112 (mEq/L)    CO2 20  19 - 32 (mEq/L)    Glucose, Bld 113 (*) 70 - 99 (mg/dL)    BUN 26 (*) 6 - 23 (mg/dL)    Creatinine, Ser 1.61  0.50 - 1.35 (mg/dL)    Calcium 9.3  8.4 - 10.5 (mg/dL)    Total Protein 6.9  6.0 - 8.3 (g/dL)    Albumin 3.5  3.5 - 5.2 (g/dL)    AST 44 (*) 0 - 37 (U/L)    ALT 19  0 - 53 (U/L)    Alkaline Phosphatase 76  39 - 117 (U/L)    Total Bilirubin 0.4  0.3 - 1.2 (mg/dL)    GFR calc non Af Amer 72 (*) >90 (mL/min)    GFR calc Af Amer 84 (*) >90 (mL/min)   PROTIME-INR     Status: Normal   Collection Time   12/02/11  9:41 AM      Component Value Range Comment   Prothrombin Time 15.0  11.6 - 15.2 (seconds)    INR 1.16  0.00 - 1.49    APTT     Status: Normal   Collection Time   12/02/11  9:41 AM      Component Value Range Comment   aPTT 30  24 - 37 (seconds)   TYPE AND  SCREEN     Status: Normal (Preliminary result)   Collection Time   12/02/11 11:28 AM      Component Value Range Comment   ABO/RH(D) O POS      Antibody Screen NEG      Sample Expiration 12/05/2011      Unit Number [id number]      Blood Component Type RBC LR PHER2      Unit division 00      Status of Unit ALLOCATED      Transfusion Status OK TO TRANSFUSE      Crossmatch Result Compatible      Unit Number [id number]      Blood Component Type RBC LR PHER1      Unit division 00      Status of Unit ALLOCATED      Transfusion Status OK TO TRANSFUSE      Crossmatch Result Compatible     PREPARE RBC (CROSSMATCH)     Status: Normal   Collection Time   12/02/11 11:28 AM      Component Value Range Comment   Order Confirmation ORDER PROCESSED BY BLOOD BANK     ABO/RH     Status: Normal   Collection Time   12/02/11 11:28 AM      Component Value Range Comment   ABO/RH(D) O POS     POCT ACTIVATED CLOTTING TIME     Status: Normal   Collection Time   12/02/11 11:39 AM      Component Value Range Comment   Activated Clotting Time 166     POCT  I-STAT, CHEM 8     Status: Abnormal   Collection Time   12/02/11 11:40 AM      Component Value Range Comment   Sodium 142  135 - 145 (mEq/L)    Potassium 2.9 (*) 3.5 - 5.1 (mEq/L)    Chloride 109  96 - 112 (mEq/L)    BUN 26 (*) 6 - 23 (mg/dL)    Creatinine, Ser 1.19  0.50 - 1.35 (mg/dL)    Glucose, Bld 147 (*) 70 - 99 (mg/dL)    Calcium, Ion 8.29  1.12 - 1.32 (mmol/L)    TCO2 21  0 - 100 (mmol/L)    Hemoglobin 12.6 (*) 13.0 - 17.0 (g/dL)    HCT 56.2 (*) 13.0 - 52.0 (%)   POCT I-STAT 4, (NA,K, GLUC, HGB,HCT)     Status: Abnormal   Collection Time   12/02/11  1:11 PM      Component Value Range Comment   Sodium 143  135 - 145 (mEq/L)    Potassium 2.9 (*) 3.5 - 5.1 (mEq/L)    Glucose, Bld 122 (*) 70 - 99 (mg/dL)    HCT 86.5 (*) 78.4 - 52.0 (%)    Hemoglobin 12.2 (*) 13.0 - 17.0 (g/dL)   POCT I-STAT 4, (NA,K, GLUC, HGB,HCT)     Status: Abnormal    Collection Time   12/02/11  2:13 PM      Component Value Range Comment   Sodium 143  135 - 145 (mEq/L)    Potassium 2.8 (*) 3.5 - 5.1 (mEq/L)    Glucose, Bld 127 (*) 70 - 99 (mg/dL)    HCT 69.6 (*) 29.5 - 52.0 (%)    Hemoglobin 10.9 (*) 13.0 - 17.0 (g/dL)   POCT I-STAT 4, (NA,K, GLUC, HGB,HCT)     Status: Abnormal   Collection Time   12/02/11  2:41 PM      Component Value Range Comment   Sodium 140  135 - 145 (mEq/L)    Potassium 3.3 (*) 3.5 - 5.1 (mEq/L)    Glucose, Bld 120 (*) 70 - 99 (mg/dL)    HCT 28.4 (*) 13.2 - 52.0 (%)    Hemoglobin 8.8 (*) 13.0 - 17.0 (g/dL)   POCT I-STAT 3, BLOOD GAS (G3+)     Status: Abnormal   Collection Time   12/02/11  2:46 PM      Component Value Range Comment   pH, Arterial 7.214 (*) 7.350 - 7.450     pCO2 arterial 61.1 (*) 35.0 - 45.0 (mmHg)    pO2, Arterial 300.0 (*) 80.0 - 100.0 (mmHg)    Bicarbonate 24.7 (*) 20.0 - 24.0 (mEq/L)    TCO2 27  0 - 100 (mmol/L)    O2 Saturation 100.0      Acid-base deficit 3.0 (*) 0.0 - 2.0 (mmol/L)    Sample type ARTERIAL     PLATELET COUNT     Status: Abnormal   Collection Time   12/02/11  3:00 PM      Component Value Range Comment   Platelets 107 (*) 150 - 400 (K/uL) PLATELET COUNT CONFIRMED BY SMEAR  HEMOGLOBIN AND HEMATOCRIT, BLOOD     Status: Abnormal   Collection Time   12/02/11  3:00 PM      Component Value Range Comment   Hemoglobin 7.8 (*) 13.0 - 17.0 (g/dL) CALLED TO STEPHANIE DEHART 1516 12/01/10 BY WOOLLENK   HCT 21.8 (*) 39.0 - 52.0 (%)   POCT I-STAT 4, (NA,K, GLUC, HGB,HCT)  Status: Abnormal   Collection Time   12/02/11  3:10 PM      Component Value Range Comment   Sodium 138  135 - 145 (mEq/L)    Potassium 3.7  3.5 - 5.1 (mEq/L)    Glucose, Bld 142 (*) 70 - 99 (mg/dL)    HCT 16.1 (*) 09.6 - 52.0 (%)    Hemoglobin 7.8 (*) 13.0 - 17.0 (g/dL)   POCT I-STAT 4, (NA,K, GLUC, HGB,HCT)     Status: Abnormal   Collection Time   12/02/11  3:52 PM      Component Value Range Comment   Sodium 138  135  - 145 (mEq/L)    Potassium 3.4 (*) 3.5 - 5.1 (mEq/L)    Glucose, Bld 130 (*) 70 - 99 (mg/dL)    HCT 04.5 (*) 40.9 - 52.0 (%)    Hemoglobin 8.5 (*) 13.0 - 17.0 (g/dL)   CBC     Status: Abnormal   Collection Time   12/02/11  4:30 PM      Component Value Range Comment   WBC 14.8 (*) 4.0 - 10.5 (K/uL)    RBC 3.42 (*) 4.22 - 5.81 (MIL/uL)    Hemoglobin 10.5 (*) 13.0 - 17.0 (g/dL) DELTA CHECK NOTED   HCT 29.6 (*) 39.0 - 52.0 (%)    MCV 86.5  78.0 - 100.0 (fL)    MCH 30.7  26.0 - 34.0 (pg)    MCHC 35.5  30.0 - 36.0 (g/dL)    RDW 81.1  91.4 - 78.2 (%)    Platelets 92 (*) 150 - 400 (K/uL) CONSISTENT WITH PREVIOUS RESULT  PROTIME-INR     Status: Abnormal   Collection Time   12/02/11  4:30 PM      Component Value Range Comment   Prothrombin Time 18.4 (*) 11.6 - 15.2 (seconds)    INR 1.50 (*) 0.00 - 1.49    APTT     Status: Normal   Collection Time   12/02/11  4:30 PM      Component Value Range Comment   aPTT 35  24 - 37 (seconds)   POCT I-STAT 4, (NA,K, GLUC, HGB,HCT)     Status: Abnormal   Collection Time   12/02/11  4:57 PM      Component Value Range Comment   Sodium 140  135 - 145 (mEq/L)    Potassium 3.4 (*) 3.5 - 5.1 (mEq/L)    Glucose, Bld 120 (*) 70 - 99 (mg/dL)    HCT 95.6 (*) 21.3 - 52.0 (%)    Hemoglobin 11.2 (*) 13.0 - 17.0 (g/dL)   POCT I-STAT 3, BLOOD GAS (G3+)     Status: Abnormal   Collection Time   12/02/11  5:01 PM      Component Value Range Comment   pH, Arterial 7.420  7.350 - 7.450     pCO2 arterial 31.3 (*) 35.0 - 45.0 (mmHg)    pO2, Arterial 89.0  80.0 - 100.0 (mmHg)    Bicarbonate 20.4  20.0 - 24.0 (mEq/L)    TCO2 21  0 - 100 (mmol/L)    O2 Saturation 97.0      Acid-base deficit 3.0 (*) 0.0 - 2.0 (mmol/L)    Patient temperature 36.5 C      Sample type ARTERIAL     GLUCOSE, CAPILLARY     Status: Abnormal   Collection Time   12/02/11  6:10 PM      Component Value Range Comment   Glucose-Capillary 110 (*)  70 - 99 (mg/dL)   PREPARE PLATELET PHERESIS      Status: Normal (Preliminary result)   Collection Time   12/02/11  6:30 PM      Component Value Range Comment   Unit Number [id number]      Blood Component Type PLTPHER LR2      Unit division 00      Status of Unit ISSUED      Transfusion Status OK TO TRANSFUSE     GLUCOSE, CAPILLARY     Status: Abnormal   Collection Time   12/02/11  7:19 PM      Component Value Range Comment   Glucose-Capillary 116 (*) 70 - 99 (mg/dL)   POCT ACTIVATED CLOTTING TIME     Status: Normal   Collection Time   12/02/11  7:21 PM      Component Value Range Comment   Activated Clotting Time 237     POCT ACTIVATED CLOTTING TIME     Status: Normal   Collection Time   12/02/11  7:28 PM      Component Value Range Comment   Activated Clotting Time 133     GLUCOSE, CAPILLARY     Status: Abnormal   Collection Time   12/02/11  8:24 PM      Component Value Range Comment   Glucose-Capillary 105 (*) 70 - 99 (mg/dL)   GLUCOSE, CAPILLARY     Status: Abnormal   Collection Time   12/02/11  9:28 PM      Component Value Range Comment   Glucose-Capillary 115 (*) 70 - 99 (mg/dL)   CBC     Status: Abnormal   Collection Time   12/02/11 10:40 PM      Component Value Range Comment   WBC 15.8 (*) 4.0 - 10.5 (K/uL)    RBC 3.52 (*) 4.22 - 5.81 (MIL/uL)    Hemoglobin 10.6 (*) 13.0 - 17.0 (g/dL)    HCT 78.2 (*) 95.6 - 52.0 (%)    MCV 86.6  78.0 - 100.0 (fL)    MCH 30.1  26.0 - 34.0 (pg)    MCHC 34.8  30.0 - 36.0 (g/dL)    RDW 21.3  08.6 - 57.8 (%)    Platelets 123 (*) 150 - 400 (K/uL)   CREATININE, SERUM     Status: Abnormal   Collection Time   12/02/11 10:40 PM      Component Value Range Comment   Creatinine, Ser 0.99  0.50 - 1.35 (mg/dL)    GFR calc non Af Amer 85 (*) >90 (mL/min)    GFR calc Af Amer >90  >90 (mL/min)   MAGNESIUM     Status: Abnormal   Collection Time   12/02/11 10:40 PM      Component Value Range Comment   Magnesium 2.8 (*) 1.5 - 2.5 (mg/dL)   POCT I-STAT, CHEM 8     Status: Abnormal   Collection Time    12/02/11 10:41 PM      Component Value Range Comment   Sodium 143  135 - 145 (mEq/L)    Potassium 3.5  3.5 - 5.1 (mEq/L)    Chloride 111  96 - 112 (mEq/L)    BUN 18  6 - 23 (mg/dL)    Creatinine, Ser 4.69  0.50 - 1.35 (mg/dL)    Glucose, Bld 629 (*) 70 - 99 (mg/dL)    Calcium, Ion 5.28 (*) 1.12 - 1.32 (mmol/L)    TCO2 21  0 - 100 (mmol/L)  Hemoglobin 10.5 (*) 13.0 - 17.0 (g/dL)    HCT 16.1 (*) 09.6 - 52.0 (%)   MRSA PCR SCREENING     Status: Normal   Collection Time   12/02/11 11:03 PM      Component Value Range Comment   MRSA by PCR NEGATIVE  NEGATIVE    GLUCOSE, CAPILLARY     Status: Abnormal   Collection Time   12/02/11 11:33 PM      Component Value Range Comment   Glucose-Capillary 110 (*) 70 - 99 (mg/dL)   GLUCOSE, CAPILLARY     Status: Abnormal   Collection Time   12/03/11  2:21 AM      Component Value Range Comment   Glucose-Capillary 113 (*) 70 - 99 (mg/dL)   CBC     Status: Abnormal   Collection Time   12/03/11  4:00 AM      Component Value Range Comment   WBC 14.3 (*) 4.0 - 10.5 (K/uL)    RBC 3.42 (*) 4.22 - 5.81 (MIL/uL)    Hemoglobin 10.3 (*) 13.0 - 17.0 (g/dL)    HCT 04.5 (*) 40.9 - 52.0 (%)    MCV 87.1  78.0 - 100.0 (fL)    MCH 30.1  26.0 - 34.0 (pg)    MCHC 34.6  30.0 - 36.0 (g/dL)    RDW 81.1  91.4 - 78.2 (%)    Platelets 117 (*) 150 - 400 (K/uL) CONSISTENT WITH PREVIOUS RESULT  BASIC METABOLIC PANEL     Status: Abnormal   Collection Time   12/03/11  4:00 AM      Component Value Range Comment   Sodium 143  135 - 145 (mEq/L)    Potassium 3.9  3.5 - 5.1 (mEq/L)    Chloride 113 (*) 96 - 112 (mEq/L)    CO2 21  19 - 32 (mEq/L)    Glucose, Bld 140 (*) 70 - 99 (mg/dL)    BUN 18  6 - 23 (mg/dL)    Creatinine, Ser 9.56  0.50 - 1.35 (mg/dL)    Calcium 7.4 (*) 8.4 - 10.5 (mg/dL)    GFR calc non Af Amer 87 (*) >90 (mL/min)    GFR calc Af Amer >90  >90 (mL/min)   MAGNESIUM     Status: Normal   Collection Time   12/03/11  4:00 AM      Component Value Range  Comment   Magnesium 2.4  1.5 - 2.5 (mg/dL)   POCT I-STAT 3, BLOOD GAS (G3+)     Status: Abnormal   Collection Time   12/03/11  5:45 AM      Component Value Range Comment   pH, Arterial 7.367  7.350 - 7.450     pCO2 arterial 39.1  35.0 - 45.0 (mmHg)    pO2, Arterial 142.0 (*) 80.0 - 100.0 (mmHg)    Bicarbonate 22.2  20.0 - 24.0 (mEq/L)    TCO2 23  0 - 100 (mmol/L)    O2 Saturation 99.0      Acid-base deficit 3.0 (*) 0.0 - 2.0 (mmol/L)    Patient temperature 38.1 C      Collection site ARTERIAL LINE      Drawn by Operator      Sample type ARTERIAL     GLUCOSE, CAPILLARY     Status: Abnormal   Collection Time   12/03/11  7:13 AM      Component Value Range Comment   Glucose-Capillary 148 (*) 70 - 99 (mg/dL)  Comment 1 Notify RN       Imaging: Imaging results have been reviewed  Assessment/Plan:   1. Active Problems: 2.  * No active hospital problems. *  3.   Time Spent Directly with Patient:  20 minutes  Length of Stay:  LOS: 1 day   POD #1 emergency CABG X 3 for LM disease with moderate LV dysfunction. Looks great ! On low dose neo. BP 80/50. Pacing. Sats OK. Labs OK. On low dose BB. Will titrate once BP better. IABP out. Treatment per TCTS.  Runell Gess 12/03/2011, 8:32 AM

## 2011-12-03 NOTE — Procedures (Signed)
Extubation Procedure Note  Patient Details:   Name: QUINDELL SHERE DOB: [birth year] MRN: [id number]   Airway Documentation:     Evaluation  O2 sats: stable throughout Complications: No apparent complications Patient did tolerate procedure well. Bilateral Breath Sounds: Clear Suctioning: Airway Yes  Fidela Salisbury 12/03/2011, 6:00 AM

## 2011-12-03 NOTE — Progress Notes (Signed)
1 Day Post-Op Procedure(s) (LRB): CORONARY ARTERY BYPASS GRAFTING (CABG) (N/A) Subjective: No complaints  Objective: Vital signs in last 24 hours: Temp:  [97.7 F (36.5 C)-100.6 F (38.1 C)] 98.8 F (37.1 C) (01/27 0800) Pulse Rate:  [29-91] 89  (01/27 0800) Cardiac Rhythm:  [-] Atrial paced (01/27 0800) Resp:  [11-27] 21  (01/27 0800) BP: (77-111)/(43-74) 77/59 mmHg (01/27 0800) SpO2:  [97 %-100 %] 99 % (01/27 0800) Arterial Line BP: (74-139)/(32-71) 90/51 mmHg (01/27 0800) FiO2 (%):  [40 %-50 %] 40 % (01/27 0522) Weight:  [83.915 kg (185 lb)-84.7 kg (186 lb 11.7 oz)] 84.7 kg (186 lb 11.7 oz) (01/27 0452)  Hemodynamic parameters for last 24 hours: PAP: (14-34)/(5-24) 29/19 mmHg CVP:  [22 mmHg-26 mmHg] 26 mmHg CO:  [3.5 L/min-4.1 L/min] 3.8 L/min CI:  [1.7 L/min/m2-2.1 L/min/m2] 1.9 L/min/m2   Now on neo at 27mcg/min   Intake/Output from previous day: 01/26 0701 - 01/27 0700 In: 5766.4 [I.V.:4026.4; Blood:670; IV Piggyback:1070] Out: 2925 [Urine:1850; Blood:755; Chest Tube:320] Intake/Output this shift: Total I/O In: -  Out: 20 [Urine:20]  General appearance: alert and cooperative Neurologic: intact Heart: regular rate and rhythm. Rub from tubes Lungs: clear to auscultation bilaterally Extremities: edema mild. Right groin ok.  Right dp and pt palpable Wound: dressing dry  Lab Results:  Basename 12/03/11 0400 12/02/11 2241 12/02/11 2240  WBC 14.3* -- 15.8*  HGB 10.3* 10.5* --  HCT 29.8* 31.0* --  PLT 117* -- 123*   BMET:  Basename 12/03/11 0400 12/02/11 2241 12/02/11 0941  NA 143 143 --  Green 3.9 3.5 --  CL 113* 111 --  CO2 21 -- 20  GLUCOSE 140* 126* --  BUN 18 18 --  CREATININE 0.92 0.80 --  CALCIUM 7.4* -- 9.3    PT/INR:  Basename 12/02/11 1630  LABPROT 18.4*  INR 1.50*   ABG    Component Value Date/Time   PHART 7.367 12/03/2011 0545   HCO3 22.2 12/03/2011 0545   TCO2 23 12/03/2011 0545   ACIDBASEDEF 3.0* 12/03/2011 0545   O2SAT 99.0 12/03/2011  0545   CBG (last 3)   Basename 12/03/11 0713 12/03/11 0221 12/02/11 2333  GLUCAP 148* 113* 110*   CXR: clear  ECG:  NSR, ST depression much improved postop  Assessment/Plan: S/P Procedure(s) (LRB): CORONARY ARTERY BYPASS GRAFTING (CABG) (N/A) Postop vasodilitation:  He was not very responsive to neo in the OR so will switch to levophed until vasodilitation resolves. Mobilize Diuresis once off vasopressors d/c tubes/lines See progression orders   LOS: 1 day    [name] 12/03/2011

## 2011-12-04 ENCOUNTER — Inpatient Hospital Stay (HOSPITAL_COMMUNITY): Payer: Non-veteran care

## 2011-12-04 ENCOUNTER — Encounter (HOSPITAL_COMMUNITY): Payer: Self-pay | Admitting: Surgery

## 2011-12-04 DIAGNOSIS — Z951 Presence of aortocoronary bypass graft: Secondary | ICD-10-CM

## 2011-12-04 DIAGNOSIS — I2510 Atherosclerotic heart disease of native coronary artery without angina pectoris: Secondary | ICD-10-CM | POA: Diagnosis present

## 2011-12-04 HISTORY — DX: Atherosclerotic heart disease of native coronary artery without angina pectoris: I25.10

## 2011-12-04 LAB — GLUCOSE, CAPILLARY
Glucose-Capillary: 83 mg/dL (ref 70–99)
Glucose-Capillary: 95 mg/dL (ref 70–99)
Glucose-Capillary: 83 mg/dL (ref 70–99)

## 2011-12-04 LAB — BASIC METABOLIC PANEL
BUN: 22 mg/dL (ref 6–23)
Creatinine, Ser: 0.86 mg/dL (ref 0.50–1.35)
GFR calc Af Amer: >90 mL/min (ref >90)
GFR calc non Af Amer: 90 mL/min — ABNORMAL LOW (ref >90)
Potassium: 3.8 mEq/L (ref 3.5–5.1)

## 2011-12-04 LAB — CBC
Hemoglobin: 9.8 g/dL — ABNORMAL LOW (ref 13.0–17.0)
MCHC: 33.8 g/dL (ref 30.0–36.0)
RDW: 15.6 % — ABNORMAL HIGH (ref 11.5–15.5)
WBC: 12.5 10*3/uL — ABNORMAL HIGH (ref 4.0–10.5)

## 2011-12-04 MED ORDER — POTASSIUM CHLORIDE CRYS ER 20 MEQ PO TBCR
40.0000 meq | EXTENDED_RELEASE_TABLET | Freq: Every day | ORAL | Status: DC
  Administered 2011-12-05 – 2011-12-06 (×2): 40 meq via ORAL
  Filled 2011-12-04 (×2): qty 2

## 2011-12-04 MED ORDER — SERTRALINE HCL 50 MG PO TABS
50.0000 mg | ORAL_TABLET | Freq: Every day | ORAL | Status: DC
Start: 2011-12-04 — End: 2011-12-06
  Administered 2011-12-04 – 2011-12-06 (×3): 50 mg via ORAL
  Filled 2011-12-04 (×3): qty 1

## 2011-12-04 MED ORDER — SODIUM CHLORIDE 0.9 % IJ SOLN: 3.0000 mL | INTRAMUSCULAR | Status: DC | PRN

## 2011-12-04 MED ORDER — FUROSEMIDE 40 MG PO TABS
40.0000 mg | ORAL_TABLET | Freq: Every day | ORAL | Status: DC
  Administered 2011-12-05 – 2011-12-06 (×2): 40 mg via ORAL
  Filled 2011-12-04 (×2): qty 1

## 2011-12-04 MED ORDER — MOVING RIGHT ALONG BOOK
Freq: Once | Status: AC
Start: 2011-12-04 — End: 2011-12-04
  Administered 2011-12-04: 12:00:00
  Filled 2011-12-04: qty 1

## 2011-12-04 MED ORDER — FINASTERIDE 5 MG PO TABS
5.0000 mg | ORAL_TABLET | Freq: Every day | ORAL | Status: DC
  Administered 2011-12-04 – 2011-12-06 (×3): 5 mg via ORAL
  Filled 2011-12-04 (×3): qty 1

## 2011-12-04 MED ORDER — BISACODYL 10 MG RE SUPP: 10.0000 mg | Freq: Every day | RECTAL | Status: DC | PRN

## 2011-12-04 MED ORDER — PROMETHAZINE HCL 25 MG/ML IJ SOLN
12.5000 mg | Freq: Four times a day (QID) | INTRAMUSCULAR | Status: DC | PRN
  Administered 2011-12-04: 12.5 mg via INTRAVENOUS
  Filled 2011-12-04: qty 1

## 2011-12-04 MED ORDER — OXYCODONE HCL 5 MG PO TABS: 5.0000 mg | ORAL_TABLET | ORAL | Status: DC | PRN

## 2011-12-04 MED ORDER — POTASSIUM CHLORIDE CRYS ER 20 MEQ PO TBCR
40.0000 meq | EXTENDED_RELEASE_TABLET | Freq: Once | ORAL | Status: AC
  Administered 2011-12-04: 40 meq via ORAL
  Filled 2011-12-04: qty 2

## 2011-12-04 MED ORDER — SODIUM CHLORIDE 0.9 % IJ SOLN
3.0000 mL | Freq: Two times a day (BID) | INTRAMUSCULAR | Status: DC
  Administered 2011-12-04 – 2011-12-05 (×4): 3 mL via INTRAVENOUS

## 2011-12-04 MED ORDER — KETOROLAC TROMETHAMINE 15 MG/ML IJ SOLN
15.0000 mg | Freq: Three times a day (TID) | INTRAMUSCULAR | Status: DC
  Administered 2011-12-04 – 2011-12-05 (×3): 15 mg via INTRAVENOUS
  Filled 2011-12-04 (×6): qty 1

## 2011-12-04 MED ORDER — SODIUM CHLORIDE 0.9 % IV SOLN: 250.0000 mL | INTRAVENOUS | Status: DC | PRN

## 2011-12-04 MED ORDER — FUROSEMIDE 10 MG/ML IJ SOLN
40.0000 mg | Freq: Once | INTRAMUSCULAR | Status: DC
  Filled 2011-12-04: qty 4

## 2011-12-04 MED ORDER — BISACODYL 5 MG PO TBEC: 10.0000 mg | DELAYED_RELEASE_TABLET | Freq: Every day | ORAL | Status: DC | PRN

## 2011-12-04 MED ORDER — METOCLOPRAMIDE HCL 5 MG PO TABS
5.0000 mg | ORAL_TABLET | Freq: Three times a day (TID) | ORAL | Status: AC
  Administered 2011-12-04 – 2011-12-06 (×8): 5 mg via ORAL
  Filled 2011-12-04 (×8): qty 1

## 2011-12-04 MED ORDER — TRAMADOL HCL 50 MG PO TABS: 50.0000 mg | ORAL_TABLET | ORAL | Status: DC | PRN

## 2011-12-04 MED ORDER — ACETAMINOPHEN 325 MG PO TABS: 650.0000 mg | ORAL_TABLET | Freq: Four times a day (QID) | ORAL | Status: DC | PRN

## 2011-12-04 MED FILL — Verapamil HCl IV Soln 2.5 MG/ML: INTRAVENOUS | Qty: 4 | Status: AC

## 2011-12-04 MED FILL — Magnesium Sulfate Inj 50%: INTRAMUSCULAR | Qty: 10 | Status: AC

## 2011-12-04 MED FILL — Nitroglycerin IV Soln 5 MG/ML: INTRAVENOUS | Qty: 10 | Status: AC

## 2011-12-04 MED FILL — Heparin Sodium (Porcine) Inj 1000 Unit/ML: INTRAMUSCULAR | Qty: 10 | Status: AC

## 2011-12-04 MED FILL — Lactated Ringer's Solution: INTRAVENOUS | Qty: 500 | Status: AC

## 2011-12-04 MED FILL — Potassium Chloride Inj 2 mEq/ML: INTRAVENOUS | Qty: 40 | Status: AC

## 2011-12-04 NOTE — Progress Notes (Addendum)
2 Days Post-Op Procedure(s) (LRB): CORONARY ARTERY BYPASS GRAFTING (CABG) (N/A) Subjective: Feels lousy because he did not sleep much  Objective: Vital signs in last 24 hours: Temp:  [98.5 F (36.9 C)-98.9 F (37.2 C)] 98.9 F (37.2 C) (01/28 0736) Pulse Rate:  [57-91] 61  (01/28 0700) Cardiac Rhythm:  [-] Normal sinus rhythm (01/28 0400) Resp:  [1-28] 15  (01/28 0700) BP: (100-124)/(55-70) 124/67 mmHg (01/28 0700) SpO2:  [93 %-100 %] 98 % (01/28 0700) Arterial Line BP: (100-129)/(47-59) 127/56 mmHg (01/27 1600) Weight:  [85.1 kg (187 lb 9.8 oz)] 85.1 kg (187 lb 9.8 oz) (01/28 0500)  Hemodynamic parameters for last 24 hours: PAP: (31-36)/(16-22) 33/16 mmHg  Intake/Output from previous day: 01/27 0701 - 01/28 0700 In: 1677 [P.O.:960; I.V.:465; IV Piggyback:252] Out: 720 [Urine:720] Intake/Output this shift:    General appearance: alert and cooperative Neurologic: intact Heart: regular rate and rhythm and friction rub heard  Lungs: clear to auscultation bilaterally Extremities: edema mild Wound: dressing dry  Lab Results:  Basename 12/04/11 0408 12/03/11 1657 12/03/11 1654  WBC 12.5* -- 18.7*  HGB 9.8* 10.9* --  HCT 29.0* 32.0* --  PLT 98* -- 124*   BMET:  Basename 12/04/11 0408 12/03/11 1657 12/03/11 0400  NA 136 142 --  K 3.8 3.7 --  CL 107 108 --  CO2 23 -- 21  GLUCOSE 95 137* --  BUN 22 21 --  CREATININE 0.86 1.00 --  CALCIUM 7.5* -- 7.4*    PT/INR:  Basename 12/02/11 1630  LABPROT 18.4*  INR 1.50*   ABG    Component Value Date/Time   PHART 7.367 12/03/2011 0545   HCO3 22.2 12/03/2011 0545   TCO2 21 12/03/2011 1657   ACIDBASEDEF 3.0* 12/03/2011 0545   O2SAT 99.0 12/03/2011 0545   CBG (last 3)   Basename 12/04/11 0746 12/04/11 0348 12/03/11 2334  GLUCAP 83 90 107*    Assessment/Plan: S/P Procedure(s) (LRB): CORONARY ARTERY BYPASS GRAFTING (CABG) (N/A) Mobilize Diuresis Plan for transfer to step-down: see transfer orders May have some  pericarditis.  Will keep on toradol for a couple days. Hold BB since HR is low 60's. Will dc lovenox with pericardial rub to prevent bleeding in pericardium.  LOS: 2 days    [name] K 12/04/2011

## 2011-12-04 NOTE — Progress Notes (Signed)
UR Completed.  Marquise Wicke [name] [phone] 12/04/2011  

## 2011-12-04 NOTE — Progress Notes (Signed)
   CARE MANAGEMENT NOTE 12/04/2011  Patient:  [name], [name]   Account Number:  [id number]  Date Initiated:  12/04/2011  Documentation initiated by:  [facility]  Subjective/Objective Assessment:   Admitted to ICU post emergent CABG post STEMI.  Has spouse.     Action/Plan:   PTA, PT INDEPENDENT, LIVES WITH SPOUSE.   Anticipated DC Date:  12/07/2011   Anticipated DC Plan:  HOME W HOME HEALTH SERVICES      DC Planning Services  CM consult      Choice offered to / List presented to:             Status of service:  In process, will continue to follow Medicare Important Message given?   (If response is "NO", the following Medicare IM given date fields will be blank) Date Medicare IM given:   Date Additional Medicare IM given:    Discharge Disposition:    Per UR Regulation:  Reviewed for med. necessity/level of care/duration of stay  Comments:  12/04/11 Courney Garrod,RN,BSN 1330 MET WITH PT TO DISCUSS DC PLANS.  PT STATES WIFE WILL PROVIDE 24HR CARE AT DC.  WILL FOLLOW FOR HOME NEEDS AS PT PROGRESSES.   12-04-11 11:15am Avie Arenas, RNBSN [phone] UR completed.  tx to acute, progressive on 12-04-11

## 2011-12-04 NOTE — Progress Notes (Signed)
CARDIAC REHAB PHASE I   PRE:  Rate/Rhythm: 71SR  BP:  Supine:   Sitting: 138/80  Standing:    SaO2: 97%2L  MODE:  Ambulation: 268 ft   POST:  Rate/Rhythem: 82  BP:  Supine: 140/80  Sitting:   Standing:    SaO2: 87%RA  93%2L 1440-1505 Pt walked 268 ft on RA with rolling walker and asst x 1.  Tolerated well except slightly SOB. Back to bed after walk. Put back on oxygen at 2L. Sats to 93% on 2L. Stated he had headache but had pain med recently.  Duanne Limerick

## 2011-12-04 NOTE — Progress Notes (Signed)
[facility]  Subjective: Complaints of nausea and SOB.  No worse than yesterday.   Objective: Vital signs in last 24 hours: Temp:  [97.4 F (36.3 C)-98.9 F (37.2 C)] 97.4 F (36.3 C) (01/28 1055) Pulse Rate:  [57-88] 71  (01/28 1055) Resp:  [1-28] 18  (01/28 1055) BP: (100-134)/(55-78) 134/78 mmHg (01/28 1055) SpO2:  [90 %-98 %] 90 % (01/28 1055) Arterial Line BP: (109-129)/(47-57) 127/56 mmHg (01/27 1600) Weight:  [85.1 kg (187 lb 9.8 oz)] 85.1 kg (187 lb 9.8 oz) (01/28 0500) Last BM Date: 12/01/11  Intake/Output from previous day: 01/27 0701 - 01/28 0700 In: 1677 [P.O.:960; I.V.:465; IV Piggyback:252] Out: 720 [Urine:720] Intake/Output this shift: Total I/O In: 40 [I.V.:40] Out: 30 [Urine:30]  Medications Current Facility-Administered Medications  Medication Dose Route Frequency Provider Last Rate Last Dose  . 0.9 %  sodium chloride infusion  250 mL Intravenous PRN Alleen Borne, MD      . acetaminophen (TYLENOL) tablet 650 mg  650 mg Oral Q6H PRN Alleen Borne, MD      . albumin human 5 % solution 250 mL  250 mL Intravenous Q15 min PRN Alleen Borne, MD   250 mL at 12/03/11 0636  . aspirin EC tablet 325 mg  325 mg Oral Daily Alleen Borne, MD   325 mg at 12/04/11 1030  . bisacodyl (DULCOLAX) EC tablet 10 mg  10 mg Oral Daily PRN Alleen Borne, MD       Or  . bisacodyl (DULCOLAX) suppository 10 mg  10 mg Rectal Daily PRN Alleen Borne, MD      . docusate sodium (COLACE) capsule 200 mg  200 mg Oral Daily Alleen Borne, MD   200 mg at 12/04/11 1030  . finasteride (PROSCAR) tablet 5 mg  5 mg Oral Daily Alleen Borne, MD      . furosemide (LASIX) tablet 40 mg  40 mg Oral Daily Alleen Borne, MD      . ketorolac (TORADOL) 15 MG/ML injection 15 mg  15 mg Intravenous Q8H Alleen Borne, MD      . metoCLOPramide (REGLAN) tablet 5 mg  5 mg Oral TID AC & HS Alleen Borne, MD      . moving right along book   Does not apply Once Alleen Borne, MD      . oxyCODONE (Oxy IR/ROXICODONE) immediate release tablet 5-10 mg  5-10 mg Oral Q3H PRN Alleen Borne, MD      . pantoprazole (PROTONIX) EC tablet 40 mg  40 mg Oral Q1200 Alleen Borne, MD      . potassium chloride 10 mEq in 50 mL *CENTRAL LINE* IVPB  10 mEq Intravenous Q1H PRN Mitchel Honour, RN   10 mEq at 12/03/11 1955  . potassium chloride SA (K-DUR,KLOR-CON) CR tablet 40 mEq  40 mEq Oral Once Alleen Borne, MD   40 mEq at 12/04/11 0831  . potassium chloride SA (K-DUR,KLOR-CON) CR tablet 40 mEq  40 mEq Oral Daily Alleen Borne, MD      . promethazine (PHENERGAN) injection 12.5 mg  12.5 mg Intravenous Q6H PRN Alleen Borne, MD      . sertraline (ZOLOFT) tablet 50 mg  50 mg Oral Daily Alleen Borne, MD      . sodium chloride 0.9 % injection 3 mL  3 mL Intravenous Q12H Alleen Borne, MD      .  sodium chloride 0.9 % injection 3 mL  3 mL Intravenous PRN Alleen Borne, MD      . traMADol Janean Sark) tablet 50-100 mg  50-100 mg Oral Q4H PRN Alleen Borne, MD      . DISCONTD: 0.45 % sodium chloride infusion   Intravenous Continuous Alleen Borne, MD      . DISCONTD: 0.9 %  sodium chloride infusion   Intravenous Continuous Alleen Borne, MD      . DISCONTD: 0.9 %  sodium chloride infusion  250 mL Intravenous Continuous Alleen Borne, MD      . DISCONTD: acetaminophen (TYLENOL) solution 650 mg  650 mg Per Tube NOW Alleen Borne, MD      . DISCONTD: acetaminophen (TYLENOL) solution 975 mg  975 mg Per Tube Q6H Alleen Borne, MD      . DISCONTD: acetaminophen (TYLENOL) suppository 650 mg  650 mg Rectal NOW Alleen Borne, MD      . DISCONTD: acetaminophen (TYLENOL) tablet 1,000 mg  1,000 mg Oral Q6H Alleen Borne, MD   1,000 mg at 12/04/11 0612  . DISCONTD: acetaminophen (TYLENOL) tablet 650 mg  650 mg Oral Q4H PRN Runell Gess, MD      . DISCONTD: aspirin chewable tablet 324 mg  324 mg Per Tube Daily Alleen Borne, MD      . DISCONTD: bisacodyl (DULCOLAX) EC tablet 10  mg  10 mg Oral Daily Alleen Borne, MD   10 mg at 12/04/11 1030  . DISCONTD: bisacodyl (DULCOLAX) suppository 10 mg  10 mg Rectal Daily Alleen Borne, MD      . DISCONTD: cefUROXime (ZINACEF) 1.5 g in dextrose 5 % 50 mL IVPB  1.5 g Intravenous Q12H Alleen Borne, MD   1.5 g at 12/03/11 2359  . DISCONTD: enoxaparin (LOVENOX) injection 40 mg  40 mg Subcutaneous QHS Alleen Borne, MD   40 mg at 12/03/11 2359  . DISCONTD: furosemide (LASIX) injection 40 mg  40 mg Intravenous Once Alleen Borne, MD      . DISCONTD: insulin aspart (novoLOG) injection 0-24 Units  0-24 Units Subcutaneous Q4H Alleen Borne, MD   4 Units at 12/03/11 1624  . DISCONTD: insulin regular (NOVOLIN R,HUMULIN R) 1 Units/mL in sodium chloride 0.9 % 100 mL infusion   Intravenous Continuous Alleen Borne, MD 0.6 mL/hr at 12/02/11 1900 0.6 Units/hr at 12/02/11 1900  . DISCONTD: lactated ringers infusion   Intravenous Continuous Alleen Borne, MD 20 mL/hr at 12/02/11 1734    . DISCONTD: metoCLOPramide (REGLAN) injection 5 mg  5 mg Intravenous Q6H Alleen Borne, MD   5 mg at 12/04/11 0629  . DISCONTD: metoprolol (LOPRESSOR) injection 2.5-5 mg  2.5-5 mg Intravenous Q2H PRN Alleen Borne, MD      . DISCONTD: midazolam (VERSED) injection 2 mg  2 mg Intravenous Q1H PRN Alleen Borne, MD   2 mg at 12/03/11 0930  . DISCONTD: morphine 4 MG/ML injection 2-5 mg  2-5 mg Intravenous Q1H PRN Alleen Borne, MD   4 mg at 12/03/11 1328  . DISCONTD: nitroGLYCERIN 0.2 mg/mL in dextrose 5 % infusion  0-100 mcg/min Intravenous Continuous Alleen Borne, MD      . DISCONTD: norepinephrine (LEVOPHED) 4,000 mcg in dextrose 5 % 250 mL infusion  2-50 mcg/min Intravenous Titrated Alleen Borne, MD 75 mL/hr at 12/03/11 1000 20 mcg/min at 12/03/11 1000  . DISCONTD: norepinephrine (LEVOPHED)  8,000 mcg in dextrose 5 % 250 mL infusion  2-50 mcg/min Intravenous Titrated Alleen Borne, MD 9.4 mL/hr at 12/03/11 1400 5 mcg/min at 12/03/11 1400  . DISCONTD:  ondansetron (ZOFRAN) injection 4 mg  4 mg Intravenous Q6H PRN Alleen Borne, MD   4 mg at 12/03/11 1706  . DISCONTD: oxyCODONE (Oxy IR/ROXICODONE) immediate release tablet 5-10 mg  5-10 mg Oral Q3H PRN Alleen Borne, MD   10 mg at 12/03/11 1623  . DISCONTD: potassium chloride 10 mEq in 50 mL *CENTRAL LINE* IVPB  10 mEq Intravenous Q1 Hr x 3 Alleen Borne, MD      . DISCONTD: promethazine (PHENERGAN) injection 12.5 mg  12.5 mg Intravenous Q6H PRN Alleen Borne, MD      . DISCONTD: sodium chloride 0.9 % injection 3 mL  3 mL Intravenous Q12H Alleen Borne, MD      . DISCONTD: sodium chloride 0.9 % injection 3 mL  3 mL Intravenous PRN Alleen Borne, MD        PE: General appearance: alert, cooperative and no distress Lungs: clear to auscultation bilaterally Heart: regular rate and rhythm, loud friction rub Abdomen: +BS Extremities: No LEE. Pulses: Decreased right PT.  1+ left DP  Lab Results:   Basename 12/04/11 0408 12/03/11 1657 12/03/11 1654 12/03/11 0400  WBC 12.5* -- 18.7* 14.3*  HGB 9.8* 10.9* 10.4* --  HCT 29.0* 32.0* 30.7* --  PLT 98* -- 124* 117*   BMET  Basename 12/04/11 0408 12/03/11 1657 12/03/11 1654 12/03/11 0400 12/02/11 0941  NA 136 142 -- 143 --  K 3.8 3.7 -- 3.9 --  CL 107 108 -- 113* --  CO2 23 -- -- 21 20  GLUCOSE 95 137* -- 140* --  BUN 22 21 -- 18 --  CREATININE 0.86 1.00 0.99 -- --  CALCIUM 7.5* -- -- 7.4* 9.3   PT/INR  Basename 12/02/11 1630 12/02/11 0941  LABPROT 18.4* 15.0  INR 1.50* 1.16    Studies/Results: PORTABLE CHEST - 1 VIEW  Comparison: 12/03/2011  Findings: Swan-Ganz catheter has been removed. Chest tubes have  been removed. Sheath remains in the superior vena cava.  The patient has developed tiny bilateral pleural effusions with  minimal atelectasis at the lung bases. Pulmonary vascularity is  normal. No pneumothorax.  IMPRESSION:  New tiny bilateral effusions with minimal bibasilar atelectasis.  Assessment/Plan  Principal  Problem:  *S/P CABG x 3: LIMA TO LAD, SVG TO OM, SVG TO RCA. Active Problems:  CAD (coronary artery disease)  Plan:  POD #2. CABG x3 LIMA TO LAD, SVG TO OM, SVG TO RCA.  Loud friction rub.  Continuing toradol per primary.  BP and HR stable.     LOS: 2 days    HAGER,BRYAN W 12/04/2011 11:17 AM  I have seen & examined the patient & reviewed the chart.  I agree with Mr. Jasper Riling note  Feels sore - but walking without too much SOB & no angina. Loud 2-3 part rub - may well have some pericarditis post MI with emergent CABG -- agree with Dr. Sharee Pimple plan for NSAID - & hold DVT prophylaxis since he is ambulatory.    As BP & HR will tolerate - would restart low dose BB, but agree with holding for HR<55. No signs of volume overload on exam or CXR.  Will follow along.  Marykay Lex, M.D., M.S. [facility] [address] [place], [place]  [address]  [phone]  12/04/2011 1:27 PM

## 2011-12-04 NOTE — Progress Notes (Signed)
Pt refused to walk. Pt stated he was going to bed. RN educated pt on importance of walking. Steva Colder [facility]  12/04/2011  10:54 PM

## 2011-12-05 ENCOUNTER — Encounter (HOSPITAL_COMMUNITY): Payer: Self-pay | Admitting: Cardiology

## 2011-12-05 DIAGNOSIS — I219 Acute myocardial infarction, unspecified: Secondary | ICD-10-CM | POA: Diagnosis present

## 2011-12-05 DIAGNOSIS — I10 Essential (primary) hypertension: Secondary | ICD-10-CM | POA: Diagnosis present

## 2011-12-05 DIAGNOSIS — I255 Ischemic cardiomyopathy: Secondary | ICD-10-CM | POA: Diagnosis present

## 2011-12-05 DIAGNOSIS — I200 Unstable angina: Secondary | ICD-10-CM | POA: Diagnosis present

## 2011-12-05 DIAGNOSIS — Z87891 Personal history of nicotine dependence: Secondary | ICD-10-CM | POA: Diagnosis present

## 2011-12-05 DIAGNOSIS — R9431 Abnormal electrocardiogram [ECG] [EKG]: Secondary | ICD-10-CM | POA: Diagnosis present

## 2011-12-05 LAB — BASIC METABOLIC PANEL
Calcium: 8 mg/dL — ABNORMAL LOW (ref 8.4–10.5)
GFR calc Af Amer: >90 mL/min (ref >90)
GFR calc non Af Amer: >90 mL/min (ref >90)
Potassium: 3.8 mEq/L (ref 3.5–5.1)
Sodium: 138 mEq/L (ref 135–145)

## 2011-12-05 LAB — CBC
MCH: 30.2 pg (ref 26.0–34.0)
MCHC: 34 g/dL (ref 30.0–36.0)
RDW: 15.5 % (ref 11.5–15.5)

## 2011-12-05 MED ORDER — ROSUVASTATIN CALCIUM 10 MG PO TABS
10.0000 mg | ORAL_TABLET | Freq: Every day | ORAL | Status: DC
  Administered 2011-12-05: 10 mg via ORAL
  Filled 2011-12-05 (×2): qty 1

## 2011-12-05 MED ORDER — ASPIRIN 325 MG PO TBEC: 325.0000 mg | DELAYED_RELEASE_TABLET | Freq: Every day | ORAL | Status: DC

## 2011-12-05 MED ORDER — OXYCODONE HCL 5 MG PO TABS: 5.0000 mg | ORAL_TABLET | ORAL | Status: AC | PRN

## 2011-12-05 NOTE — Progress Notes (Signed)
Subjective:  No chest no SOB  Objective:  Vital Signs in the last 24 hours: Temp:  [98.4 F (36.9 C)-99.7 F (37.6 C)] 98.4 F (36.9 C) (01/29 0451) Pulse Rate:  [69-71] 69  (01/29 0451) Resp:  [18-20] 20  (01/29 0451) BP: (99-125)/(62-78) 104/62 mmHg (01/29 0451) SpO2:  [94 %-95 %] 95 % (01/29 0451) Weight:  [84.4 kg (186 lb 1.1 oz)] 84.4 kg (186 lb 1.1 oz) (01/29 0451)  Intake/Output from previous day:  Intake/Output Summary (Last 24 hours) at 12/05/11 1118 Last data filed at 12/05/11 0800  Gross per 24 hour  Intake    600 ml  Output    350 ml  Net    250 ml    Physical Exam: General appearance: alert, cooperative and no distress Lungs: clear to auscultation bilaterally Heart: regular rate and rhythm and friction rub heard  Apex   Rate: 70  Rhythm: normal sinus rhythm  Lab Results:  Basename 12/05/11 0545 12/04/11 0408  WBC 9.6 12.5*  HGB 9.7* 9.8*  PLT 97* 98*    Basename 12/05/11 0545 12/04/11 0408  NA 138 136  K 3.8 3.8  CL 108 107  CO2 23 23  GLUCOSE 87 95  BUN 25* 22  CREATININE 0.79 0.86   No results found for this basename: TROPONINI:2,CK,MB:2 in the last 72 hours Hepatic Function Panel No results found for this basename: PROT,ALBUMIN,AST,ALT,ALKPHOS,BILITOT,BILIDIR,IBILI in the last 72 hours No results found for this basename: CHOL in the last 72 hours  Basename 12/02/11 1630  INR 1.50*    Imaging: Dg Chest Portable 1 View In Am  12/04/2011  *RADIOLOGY REPORT*  Clinical Data: Recent CABG.  PORTABLE CHEST - 1 VIEW  Comparison: 12/03/2011  Findings: Swan-Ganz catheter has been removed.  Chest tubes have been removed.  Sheath remains in the superior vena cava.  The patient has developed tiny bilateral pleural effusions with minimal atelectasis at the lung bases.  Pulmonary vascularity is normal.  No pneumothorax.  IMPRESSION: New tiny bilateral effusions with minimal bibasilar atelectasis.  Original Report Authenticated By: Gwynn Burly, M.D.      Cardiac Studies:  Assessment/Plan:   Principal Problem:  *Unstable angina  Active Problems:  Abnormal EKG, marked diffuse downsloping ST depression,( treated as STEMI in ER)  Acute MI,( only one POC marker drawn-0.9)  S/P CABG x 3: LIMA TO LAD, SVG TO OM, SVG TO RCA., (emergent)  CAD (coronary artery disease), severe 4 vessell at cath with 99% LMCA  Cardiomyopathy, EF 40% at cath  Smoker  HTN (hypertension)  Plan-Resume ACE when able, B/P soft now.  Add Statin, f/u with Dr Allyson Sabal as an OP after discharge.    Corine Shelter PA-C 12/05/2011, 11:18 AM    Agree with note written by Corine Shelter PAC  POD #3 emergent CABG X 3. Looks great! Ambulating. Exam benign. Agree with adding low dose ace-i BP allowing. ROV with me after D/C.  Runell Gess 12/05/2011 3:08 PM

## 2011-12-05 NOTE — Progress Notes (Signed)
Discontinued EPW's per MD order per hospital protocol. Patient tolerated well. Pacing wires intact. Patient reminded to stay in bed for 1 hour. Bernita Raisin, RN

## 2011-12-05 NOTE — Progress Notes (Signed)
CARDIAC REHAB PHASE I   PRE:  Rate/Rhythm: 77 SR  BP:  Supine:   Sitting: 128/70  Standing:    SaO2: 98 RA  MODE:  Ambulation: 890 ft   POST:  Rate/Rhythem: 95  BP:  Supine:   Sitting: 140/60  Standing:    SaO2: 94 RA [id number]  Tolerated ambulation great. He states he feels much better today. Gait steady, fast pace walk. VS stable. RA sat after walk 94%. Walked 863ft. To chair after walk.  [name]

## 2011-12-05 NOTE — Progress Notes (Signed)
Patient wants to try to walk later, states "I think I over did it today". Will let the night nurse know that the patient only walked once today. Bernita Raisin, RN

## 2011-12-05 NOTE — Progress Notes (Signed)
Pt is a current smoker who plans to quit after d/c. Advised and encouraged pt to quit. He is in action stage. Referred to 1-800 quit now for f/u and support. Discussed oral fixation substitutes, second hand smoke and in home smoking policy. Reviewed and gave pt Written education/contact information.

## 2011-12-05 NOTE — Plan of Care (Signed)
Problem: Phase II Progression Outcomes Goal: Other Phase II Outcomes/Goals Outcome: Completed/Met Date Met:  12/05/11 Video #113 Going home after cardiac surgery

## 2011-12-05 NOTE — Progress Notes (Addendum)
[address]            [place] [address]          [phone]     3 Days Post-Op  Procedure(s) (LRB): CORONARY ARTERY BYPASS GRAFTING (CABG) (N/A) Subjective: Feels well  Objective  Telemetry NSR  Temp:  [97.4 F (36.3 C)-99.7 F (37.6 C)] 98.4 F (36.9 C) (01/29 0451) Pulse Rate:  [65-71] 69  (01/29 0451) Resp:  [11-20] 20  (01/29 0451) BP: (99-134)/(62-78) 104/62 mmHg (01/29 0451) SpO2:  [90 %-98 %] 95 % (01/29 0451) Weight:  [186 lb 1.1 oz (84.4 kg)] 186 lb 1.1 oz (84.4 kg) (01/29 0451)   Intake/Output Summary (Last 24 hours) at 12/05/11 0841 Last data filed at 12/05/11 0452  Gross per 24 hour  Intake    620 ml  Output    350 ml  Net    270 ml       General appearance: alert and no distress Heart: regular rate and rhythm and S1, S2 normal Lungs: clear to auscultation bilaterally Abdomen: soft, non-tender; bowel sounds normal; no masses,  no organomegaly Extremities: no edema, redness or tenderness in the calves or thighs Wound: incisions healing well without signs of infection  Lab Results:  Basename 12/05/11 0545 12/04/11 0408 12/03/11 1654 12/03/11 0400  NA 138 136 -- --  K 3.8 3.8 -- --  CL 108 107 -- --  CO2 23 23 -- --  GLUCOSE 87 95 -- --  BUN 25* 22 -- --  CREATININE 0.79 0.86 -- --  CALCIUM 8.0* 7.5* -- --  MG -- -- 2.4 2.4  PHOS -- -- -- --    Basename 12/02/11 0941  AST 44*  ALT 19  ALKPHOS 76  BILITOT 0.4  PROT 6.9  ALBUMIN 3.5   No results found for this basename: LIPASE:2,AMYLASE:2 in the last 72 hours  Basename 12/05/11 0545 12/04/11 0408 12/02/11 0941  WBC 9.6 12.5* --  NEUTROABS -- -- 9.4*  HGB 9.7* 9.8* --  HCT 28.5* 29.0* --  MCV 88.8 89.5 --  PLT 97* 98* --   No results found for this basename: CKTOTAL:4,CKMB:4,TROPONINI:4 in the last 72 hours No components found with this basename: POCBNP:3 No results found for this basename: DDIMER in the last 72 hours No results found for this  basename: HGBA1C in the last 72 hours No results found for this basename: CHOL,HDL,LDLCALC,TRIG,CHOLHDL in the last 72 hours No results found for this basename: TSH,T4TOTAL,FREET3,T3FREE,THYROIDAB in the last 72 hours No results found for this basename: VITAMINB12,FOLATE,FERRITIN,TIBC,IRON,RETICCTPCT in the last 72 hours  Medications: Scheduled    . aspirin EC  325 mg Oral Daily  . docusate sodium  200 mg Oral Daily  . finasteride  5 mg Oral Daily  . furosemide  40 mg Oral Daily  . ketorolac  15 mg Intravenous Q8H  . metoCLOPramide  5 mg Oral TID AC & HS  . moving right along book   Does not apply Once  . pantoprazole  40 mg Oral Q1200  . potassium chloride  40 mEq Oral Daily  . sertraline  50 mg Oral Daily  . sodium chloride  3 mL Intravenous Q12H  . DISCONTD: acetaminophen  1,000 mg Oral Q6H  . DISCONTD: bisacodyl  10 mg Oral Daily  . DISCONTD: bisacodyl  10 mg Rectal Daily  . DISCONTD: cefUROXime (ZINACEF)  IV  1.5  g Intravenous Q12H  . DISCONTD: enoxaparin  40 mg Subcutaneous QHS  . DISCONTD: insulin aspart  0-24 Units Subcutaneous Q4H  . DISCONTD: metoCLOPramide (REGLAN) injection  5 mg Intravenous Q6H     Radiology/Studies:  Dg Chest Portable 1 View In Am  12/04/2011  *RADIOLOGY REPORT*  Clinical Data: Recent CABG.  PORTABLE CHEST - 1 VIEW  Comparison: 12/03/2011  Findings: Swan-Ganz catheter has been removed.  Chest tubes have been removed.  Sheath remains in the superior vena cava.  The patient has developed tiny bilateral pleural effusions with minimal atelectasis at the lung bases.  Pulmonary vascularity is normal.  No pneumothorax.  IMPRESSION: New tiny bilateral effusions with minimal bibasilar atelectasis.  Original Report Authenticated By: Gwynn Burly, M.D.    INR: Will add last result for INR, ABG once components are confirmed Will add last 4 CBG results once components are confirmed  Assessment/Plan: S/P Procedure(s) (LRB): CORONARY ARTERY BYPASS GRAFTING  (CABG) (N/A) 1. Doing very well 2. Cont gentle diuresis 3  routine rehab 4. D/c epw's 5. Poss home 1-2 days    LOS: 3 days    GOLD,WAYNE E 1/29/20138:41 AM     Chart reviewed, patient examined, agree with above.

## 2011-12-05 NOTE — Progress Notes (Signed)
   CARE MANAGEMENT NOTE 12/05/2011  Patient:  [name], [name]   Account Number:  [id number]  Date Initiated:  12/04/2011  Documentation initiated by:  [facility]  Subjective/Objective Assessment:   Admitted to ICU post emergent CABG post STEMI.  Has spouse.     Action/Plan:   PTA, PT INDEPENDENT, LIVES WITH SPOUSE.   Anticipated DC Date:  12/07/2011   Anticipated DC Plan:  HOME W HOME HEALTH SERVICES      DC Planning Services  CM consult      Choice offered to / List presented to:             Status of service:  In process, will continue to follow Medicare Important Message given?   (If response is "NO", the following Medicare IM given date fields will be blank) Date Medicare IM given:   Date Additional Medicare IM given:    Discharge Disposition:    Per UR Regulation:  Reviewed for med. necessity/level of care/duration of stay  Comments:  12/05/11 Nazaiah Navarrete,RN,BSN 1530 PT'S INSURANCE NOW SHOWING VETERAN'S ADMIN.  RECEIVED PHONE CALL FROM JOEY CAMPBELL AT [facility], PHONE # [phone], EXT [phone].  LEFT MESSAGE FOR RETURN CALL. Phone #[phone]   12/04/11 Man Bonneau,RN,BSN 1330 MET WITH PT TO DISCUSS DC PLANS.  PT STATES WIFE WILL PROVIDE 24HR CARE AT DC.  WILL FOLLOW FOR HOME NEEDS AS PT PROGRESSES.   12-04-11 11:15am Avie Arenas, RNBSN [phone] UR completed.  tx to acute, progressive on 12-04-11

## 2011-12-06 LAB — TYPE AND SCREEN
Antibody Screen: NEGATIVE
Unit division: 0

## 2011-12-06 MED ORDER — GUAIFENESIN ER 600 MG PO TB12
600.0000 mg | ORAL_TABLET | Freq: Two times a day (BID) | ORAL | Status: DC
  Filled 2011-12-06 (×2): qty 1

## 2011-12-06 MED ORDER — GUAIFENESIN ER 600 MG PO TB12: 600.0000 mg | ORAL_TABLET | Freq: Two times a day (BID) | ORAL | Status: DC

## 2011-12-06 MED ORDER — ROSUVASTATIN CALCIUM 10 MG PO TABS: 10.0000 mg | ORAL_TABLET | Freq: Every day | ORAL | Status: DC

## 2011-12-06 NOTE — Plan of Care (Signed)
Problem: Phase III Progression Outcomes Goal: Transfer to PCTU/Telemetry POD Outcome: Completed/Met Date Met:  12/06/11 12/04/11

## 2011-12-06 NOTE — Plan of Care (Signed)
Problem: Phase III Progression Outcomes Goal: Time patient transferred to PCTU/Telemetry POD Outcome: Completed/Met Date Met:  12/06/11 1400

## 2011-12-06 NOTE — Progress Notes (Signed)
Cardiac Rehab [phone] Education completed with pt. Encouraged smoking cessation. Pt states he and wife are going to quit together. Permission given to refer to [facility] Phase 2. Rydge Texidor DunlapRN

## 2011-12-06 NOTE — Progress Notes (Signed)
[facility]  Subjective: Patient awake and reading newspaper.  No CP, No SOB.  Mentioned to nurse that he may have "overdone it" walking yesterday.  He developed some chest discomfort while ambulating.  He returned to his room to rest and the discomfort subsided.    Objective: Vital signs in last 24 hours: Temp:  [98.3 F (36.8 C)-98.9 F (37.2 C)] 98.3 F (36.8 C) (01/30 0300) Pulse Rate:  [71-78] 71  (01/30 0300) Resp:  [18-24] 24  (01/30 0300) BP: (120-152)/(62-86) 130/86 mmHg (01/30 0300) SpO2:  [92 %-94 %] 93 % (01/30 0300) Weight:  [83.28 kg (183 lb 9.6 oz)] 83.28 kg (183 lb 9.6 oz) (01/30 0300) Last BM Date: 12/05/11  Intake/Output from previous day: 01/29 0701 - 01/30 0700 In: 1200 [P.O.:1200] Out: 300 [Urine:300] Intake/Output this shift:    Medications Current Facility-Administered Medications  Medication Dose Route Frequency Provider Last Rate Last Dose  . 0.9 %  sodium chloride infusion  250 mL Intravenous PRN Alleen Borne, MD      . acetaminophen (TYLENOL) tablet 650 mg  650 mg Oral Q6H PRN Alleen Borne, MD      . aspirin EC tablet 325 mg  325 mg Oral Daily Alleen Borne, MD   325 mg at 12/05/11 1037  . bisacodyl (DULCOLAX) EC tablet 10 mg  10 mg Oral Daily PRN Alleen Borne, MD       Or  . bisacodyl (DULCOLAX) suppository 10 mg  10 mg Rectal Daily PRN Alleen Borne, MD      . docusate sodium (COLACE) capsule 200 mg  200 mg Oral Daily Alleen Borne, MD   200 mg at 12/04/11 1030  . finasteride (PROSCAR) tablet 5 mg  5 mg Oral Daily Alleen Borne, MD   5 mg at 12/05/11 1037  . furosemide (LASIX) tablet 40 mg  40 mg Oral Daily Alleen Borne, MD   40 mg at 12/05/11 1037  . ketorolac (TORADOL) 15 MG/ML injection 15 mg  15 mg Intravenous Q8H Alleen Borne, MD   15 mg at 12/05/11 0551  . metoCLOPramide (REGLAN) tablet 5 mg  5 mg Oral TID AC & HS Alleen Borne, MD   5 mg at 12/06/11 1610  . oxyCODONE (Oxy IR/ROXICODONE) immediate  release tablet 5-10 mg  5-10 mg Oral Q3H PRN Alleen Borne, MD      . pantoprazole (PROTONIX) EC tablet 40 mg  40 mg Oral Q1200 Alleen Borne, MD   40 mg at 12/05/11 1143  . potassium chloride SA (K-DUR,KLOR-CON) CR tablet 40 mEq  40 mEq Oral Daily Alleen Borne, MD   40 mEq at 12/05/11 1037  . promethazine (PHENERGAN) injection 12.5 mg  12.5 mg Intravenous Q6H PRN Alleen Borne, MD   12.5 mg at 12/04/11 1220  . rosuvastatin (CRESTOR) tablet 10 mg  10 mg Oral q1800 Eda Paschal [place], [place]   10 mg at 12/05/11 1717  . sertraline (ZOLOFT) tablet 50 mg  50 mg Oral Daily Alleen Borne, MD   50 mg at 12/05/11 1037  . sodium chloride 0.9 % injection 3 mL  3 mL Intravenous Q12H Alleen Borne, MD   3 mL at 12/05/11 2148  . sodium chloride 0.9 % injection 3 mL  3 mL Intravenous PRN Alleen Borne, MD      . traMADol Janean Sark) tablet 50-100 mg  50-100 mg Oral Q4H PRN Payton Doughty  Laneta Simmers, MD        PE: General appearance: alert, cooperative and no distress Neck: no adenopathy, no carotid bruit, no JVD, supple, symmetrical, trachea midline and thyroid not enlarged, symmetric, no tenderness/mass/nodules Lungs: clear to auscultation bilaterally Heart: regular rate and rhythm and friction rub heard best at apex but less noticeable than previously  Lab Results:   Basename 12/05/11 0545 12/04/11 0408 12/03/11 1657 12/03/11 1654  WBC 9.6 12.5* -- 18.7*  HGB 9.7* 9.8* 10.9* --  HCT 28.5* 29.0* 32.0* --  PLT 97* 98* -- 124*   BMET  Basename 12/05/11 0545 12/04/11 0408 12/03/11 1657  NA 138 136 142  K 3.8 3.8 3.7  CL 108 107 108  CO2 23 23 --  GLUCOSE 87 95 137*  BUN 25* 22 21  CREATININE 0.79 0.86 1.00  CALCIUM 8.0* 7.5* --   PT/INR No results found for this basename: LABPROT:3,INR:3 in the last 72 hours Cholesterol No results found for this basename: CHOL in the last 72 hours Cardiac Enzymes No components found with this basename: TROPONIN:3,  CKMB:3  Studies/Results: @RISRSLT2 @   Assessment/Plan    Principal Problem:  *Unstable angina  Active Problems:  S/P CABG x 3: LIMA TO LAD, SVG TO OM, SVG TO RCA., (emergent)  CAD (coronary artery disease), severe 4 vessell at cath with 99% LMCA  Abnormal EKG, marked diffuse downsloping ST depression,( treated as STEMI in ER)  Acute MI,( only one POC marker drawn-0.9)  Smoker  HTN (hypertension)  Cardiomyopathy, EF 40% at cath  Plan:  POD #4 s/p emergent CABG X3 for 99% left main.  Will still encourage to ambulate with assistance. NSR, HR 74 bpm. BP 130/86 @ 0300.  Plan for f/u as outpatient with Dr. Allyson Sabal upon discharge.  BP/HR stable and controlled.   LOS: 4 days    HAGER,BRYAN W 12/06/2011 8:03 AM  I have seen & examined the patient this AM & reviewed the chart along with Wilburt Finlay, PA.  I agree with Bryans findings & assessment/plan.  Looks surprisingly well POD#4 s/p Emergent CABG.  No further pericariditc pain.  BP/HR stable.   Plan is d/c per CVTS ==> he will f/u with Dr. Allyson Sabal.  Marykay Lex, M.D., M.S. [facility] [address] [place], [place]  [address]  [phone]  12/06/2011 1:26 PM

## 2011-12-06 NOTE — Progress Notes (Signed)
Offer to amb. With patient and patient refused. 

## 2011-12-06 NOTE — Progress Notes (Signed)
   CARE MANAGEMENT NOTE 12/06/2011  Patient:  [name], [name]   Account Number:  [id number]  Date Initiated:  12/04/2011  Documentation initiated by:  [facility]  Subjective/Objective Assessment:   Admitted to ICU post emergent CABG post STEMI.  Has spouse.     Action/Plan:   PTA, PT INDEPENDENT, LIVES WITH SPOUSE.   Anticipated DC Date:  12/07/2011   Anticipated DC Plan:  HOME W HOME HEALTH SERVICES      DC Planning Services  CM consult      Choice offered to / List presented to:             Status of service:  Completed, signed off Medicare Important Message given?   (If response is "NO", the following Medicare IM given date fields will be blank) Date Medicare IM given:   Date Additional Medicare IM given:    Discharge Disposition:  HOME/SELF CARE  Per UR Regulation:  Reviewed for med. necessity/level of care/duration of stay  Comments:  12/06/11 Calisha Tindel,RN,BSN 1426 PT DISCHARGED HOME TODAY.  NO HOME HEALTH OR DME NEEDED. SPOKE WITH JOEL AT [facility], AND UPDATED ON STATUS. Phone #[phone]  12/05/11 Addisyn Leclaire,RN,BSN 1530 PT'S INSURANCE NOW SHOWING VETERAN'S ADMIN.  RECEIVED PHONE CALL FROM JOEY CAMPBELL AT [facility], PHONE # [phone], EXT [phone].  LEFT MESSAGE FOR RETURN CALL. Phone #[phone]   12/04/11 Ricci Dirocco,RN,BSN 1330 MET WITH PT TO DISCUSS DC PLANS.  PT STATES WIFE WILL PROVIDE 24HR CARE AT DC.  WILL FOLLOW FOR HOME NEEDS AS PT PROGRESSES. Phone #[phone]   12-04-11 11:15am Avie Arenas, RNBSN - [phone] UR completed.  tx to acute, progressive on 12-04-11

## 2011-12-06 NOTE — Discharge Summary (Signed)
[address]            [place] [address]          [phone]      [name] 1945/12/27 66 y.o. [id number]  12/02/2011   Alleen Borne, MD  Hypokalemia [276.8] Non-STEMI (non-ST elevated myocardial infarction) [410.70] chest pain coronary artery disease  [name].  HPI He presents with a several day history of stuttering chest pain. ECG on presentation showed marked anterolateral ST depression and cardiac enzymes were positive. Code STEMI called and cath shows 99% ostial Left Main, moderate mid LAD stenosis, and moderate RCA stenosis. LVgram shows anteroapical hypokinesis. IABP inserted for anatomy and ongoing chest pain.  Past Medical History   Diagnosis  Date   .  Diabetes mellitus    .  Hypertension    .  Hypercholesterolemia     Past Surgical History   Procedure  Date   .  Back surgery     History reviewed. No pertinent family history.  Social History: reports that he has been smoking. He does not have any smokeless tobacco history on file. He reports that he does not drink alcohol or use illicit drugs.  Allergies: No Known Allergies  Medications: At time of consultation I have reviewed the patient's current medications.  Prior to Admission:  No prescriptions prior to admission   Dg Chest [facility] 1 View  12/02/2011 *RADIOLOGY REPORT* Clinical Data: Chest pain, CHF, infiltrate. PORTABLE CHEST - 1 VIEW Comparison: Abdominal CT 06/28/2005. Findings: 0910 hours. The heart size and mediastinal contours are normal. The lungs are clear. There is no pleural effusion or pneumothorax. Scattered thoracic spine osteophytes are noted. Telemetry leads overlie the chest. IMPRESSION: No active cardiopulmonary process. Original Report Authenticated By: Gerrianne Scale, M.D.   Review of Systems at time of consultation: Constitutional: Positive for malaise/fatigue.  HENT: Negative.  Eyes: Negative.  Respiratory: Positive for  shortness of breath.  Cardiovascular: Positive for chest pain and orthopnea. Negative for palpitations, claudication, leg swelling and PND.  Gastrointestinal: Negative.  Genitourinary: Negative.  Musculoskeletal: Negative.  Skin: Negative.  Neurological: Negative.  Endo/Heme/Allergies: Negative.  Psychiatric/Behavioral: Negative.   Blood pressure 107/68, pulse 80, temperature 97.6 F (36.4 C), resp. rate 15, height 5' 10.5" (1.791 m), weight 83.915 kg (185 lb), SpO2 97.00%.  Physical Exam at time of consultation: Constitutional: He is oriented to person, place, and time. He appears well-developed and well-nourished.  HENT:  Head: Normocephalic and atraumatic.  Eyes: EOM are normal. Pupils are equal, round, and reactive to light.  Neck: No JVD present. No thyromegaly present.  Cardiovascular: Normal rate, regular rhythm, normal heart sounds and intact distal pulses. Exam reveals no gallop and no friction rub.  No murmur heard.  Respiratory: Effort normal and breath sounds normal.  GI: Soft. Bowel sounds are normal. He exhibits no mass. There is no tenderness.  Musculoskeletal: He exhibits no edema.  Lymphadenopathy:  He has no cervical adenopathy.  Neurological: He is alert and oriented to person, place, and time.  Skin: Skin is warm and dry.  Psychiatric: He has a normal mood and affect.   Cardiac Cath:  HEMODYNAMICS:  AO SYSTOLIC/AO DIASTOLIC: 112/64  LV SYSTOLIC/LV DIASTOLIC: 107/33  ANGIOGRAPHIC RESULTS:  1. Left main; 99% ostial  2. LAD; 80% mid  3. Left circumflex; nondominant and normal.  4. Right  coronary artery; dominant with scattered 40-50% lockage is in the midportion  5. Left ventriculography; RAO left ventriculogram was performed using  25 mL of Visipaque dye at 12 mL/second. The overall LVEF estimated  40 % With/ wall motion abnormalities.there was moderate hypokinesia of the anterior wall and apex  IMPRESSION:[name] has high-grade ostial left main stenosis  with mid LAD disease and moderate LV dysfunction. He will need emergency coronary artery bypass grafting. Intra-aortic pump was inserted to the right groin for hemodynamic support. To [name] from TCTS was consulted. The patient left the lab in stable condition with intra-aortic balloon counterpulsation on the way to the operating room for bypass surgery.  [name], [facility]  Assessment/Plan:  Tight ostial left main stenosis with acute STEMI. Agree with need for emergent CABG. I discussed the operative procedure with the patient and family including alternatives, benefits and risks; including but not limited to bleeding, blood transfusion, infection, stroke, myocardial infarction, graft failure, heart block requiring a permanent pacemaker, organ dysfunction, and death. [name] understands and agrees to proceed. Heart team has been called and plan surgery emergently. The patient was seen in cardiothoracic surgical consultation by  [name] M.D. and the patient was taken promptly to the operating room for emergent coronary artery revascularization.    OPERATIVE REPORT 12/02/2011 PREOPERATIVE DIAGNOSIS: High-grade ostial left main coronary stenosis  with acute ST-segment elevation myocardial infarction.  POSTOPERATIVE DIAGNOSIS: High-grade ostial left main coronary stenosis  with acute ST-segment elevation myocardial infarction.  OPERATIVE PROCEDURE: Emergency median sternotomy, extracorporeal  circulation, coronary artery bypass graft surgery x3 using a left  internal mammary artery graft to left anterior descending coronary, with  a saphenous vein graft to the obtuse marginal branch of left circumflex  coronary artery, and a saphenous vein graft to the right coronary  artery. Endoscopic vein harvesting from the right leg.  ATTENDING SURGEON: [name], M.D.  ASSISTANT: Al Corpus, [facility]  The patient tolerated the procedure well and was taken to the surgical intensive  care unit in stable condition.  Postoperative hospital course: The patient has overall progressed nicely. He did initially require some Levophed for significant vasodilation initially. Inotropic support was able to be weaned fairly quickly as was the intra-aortic balloon pump. He was weaned from the ventilator without difficulty. All routine lines, monitors, drainage devices have been discontinued in the standard fashion. He does have an acute blood loss anemia. He did also have a postoperative thrombocytopenia. This is improving. His most recent platelet count is 97,000. His values have stabilized. He has had a moderate amount of postoperative volume overload but this has responded well to diuresis. He is tolerating gradual increase in activities using standard cardiac rehabilitation protocols. Incisions are healing well without evidence of infection. He has had no significant cardiac dysrhythmias. Currently his status is felt to be quite stable for discharge on today's date.      Basename 12/05/11 0545 12/04/11 0408  NA 138 136  K 3.8 3.8  CL 108 107  CO2 23 23  GLUCOSE 87 95  BUN 25* 22  CALCIUM 8.0* 7.5*    Basename 12/05/11 0545 12/04/11 0408  WBC 9.6 12.5*  HGB 9.7* 9.8*  HCT 28.5* 29.0*  PLT 97* 98*   No results found for this basename: INR:2 in the last 72 hours   Discharge Instructions:  The patient is discharged to home with extensive instructions on wound care and progressive ambulation.  They are instructed not to drive or  perform any heavy lifting until returning to see the physician in his office.  Discharge Diagnosis:  Hypokalemia [276.8] Non-STEMI (non-ST elevated myocardial infarction) [410.70] chest pain coronary artery disease  Secondary Diagnosis: Patient Active Problem List  Diagnoses  . S/P CABG x 3: LIMA TO LAD, SVG TO OM, SVG TO RCA., (emergent)  . CAD (coronary artery disease), severe 4 vessell at cath with 99% LMCA  . Unstable angina  . Abnormal  EKG, marked diffuse downsloping ST depression,( treated as STEMI in ER)  . Acute MI,( only one POC marker drawn-0.9)  . Smoker  . HTN (hypertension)  . Cardiomyopathy, EF 40% at cath   Past Medical History  Diagnosis Date  . Hypertension   . Hypercholesterolemia        Hudsen, Fei  Home Medication Instructions ZOX:[id number]   Printed on:12/06/11 8119  Medication Information                    finasteride (PROSCAR) 5 MG tablet Take 5 mg by mouth daily.           sertraline (ZOLOFT) 100 MG tablet Take 50 mg by mouth daily.           hydrochlorothiazide (HYDRODIURIL) 25 MG tablet Take 12.5 mg by mouth daily.           omeprazole (PRILOSEC) 20 MG capsule Take 20 mg by mouth 2 (two) times daily.           hydrOXYzine (ATARAX/VISTARIL) 25 MG tablet Take 50 mg by mouth 3 (three) times daily as needed. For allergies.           aspirin EC 325 MG EC tablet Take 1 tablet (325 mg total) by mouth daily.           oxyCODONE (OXY IR/ROXICODONE) 5 MG immediate release tablet Take 1-2 tablets (5-10 mg total) by mouth every 4 (four) hours as needed.           rosuvastatin (CRESTOR) 10 MG tablet Take 1 tablet (10 mg total) by mouth daily at 6 PM.           guaiFENesin (MUCINEX) 600 MG 12 hr tablet Take 1 tablet (600 mg total) by mouth 2 (two) times daily. For cough             Disposition: Discharged home  Patient's condition is Good  Gershon Crane, PA-C 12/06/2011  8:59 AM

## 2011-12-06 NOTE — Progress Notes (Signed)
4 Days Post-Op Procedure(s) (LRB): CORONARY ARTERY BYPASS GRAFTING (CABG) (N/A)  Subjective: Patient feels fairly well and wants to go home.  Objective: Vital signs in last 24 hours: Patient Vitals for the past 24 hrs:  BP Temp Temp src Pulse Resp SpO2 Weight  12/06/11 0300 130/86 mmHg 98.3 F (36.8 C) Oral 71  24  93 % 183 lb 9.6 oz (83.28 kg)  12/05/11 2024 152/86 mmHg 98.9 F (37.2 C) Oral 72  20  94 % -  12/05/11 1335 130/62 mmHg - - 76  - - -  12/05/11 1300 120/74 mmHg 98.7 F (37.1 C) Oral 78  18  92 % -   Pre op weight  83.9 kg Current Weight  12/06/11 183 lb 9.6 oz (83.28 kg)      Intake/Output from previous day: 01/29 0701 - 01/30 0700 In: 1200 [P.O.:1200] Out: 300 [Urine:300]   Physical Exam:  Cardiovascular: RRR, no murmurs, gallops, or rubs. Pulmonary: Clear to auscultation bilaterally; no rales, wheezes, or rhonchi. Abdomen: Soft, non tender, bowel sounds present. Extremities: Trace bilateral lower extremity edema. Wounds: Clean and dry.  No erythema or signs of infection.  Lab Results: CBC: Basename 12/05/11 0545 12/04/11 0408  WBC 9.6 12.5*  HGB 9.7* 9.8*  HCT 28.5* 29.0*  PLT 97* 98*   BMET:  Basename 12/05/11 0545 12/04/11 0408  NA 138 136  K 3.8 3.8  CL 108 107  CO2 23 23  GLUCOSE 87 95  BUN 25* 22  CREATININE 0.79 0.86  CALCIUM 8.0* 7.5*    PT/INR: No results found for this basename: LABPROT,INR in the last 72 hours ABG:  INR: Will add last result for INR, ABG once components are confirmed Will add last 4 CBG results once components are confirmed  Assessment/Plan:  1. CV -Previous bradycardia so not on beta blocker. HR now in low 70's.  2.  Pulmonary - Encourage incentive spirometer. 3. Volume Overload - Diurese. 4.  Acute blood loss anemia - H/H stable at 9.7/28.5. 5.Thrombocytopenia-Last platelet 97,000. 6.Remove CT sutures 7.Discharge home.   Kylyn Mcdade MPA-C 12/06/2011

## 2011-12-12 MED FILL — Electrolyte-R (PH 7.4) Solution: INTRAVENOUS | Qty: 1000 | Status: AC

## 2011-12-12 MED FILL — Sodium Bicarbonate IV Soln 8.4%: INTRAVENOUS | Qty: 50 | Status: AC

## 2011-12-12 MED FILL — Sodium Chloride Irrigation Soln 0.9%: Qty: 3000 | Status: AC

## 2011-12-12 MED FILL — Sodium Chloride IV Soln 0.9%: INTRAVENOUS | Qty: 1000 | Status: AC

## 2011-12-12 MED FILL — Mannitol IV Soln 20%: INTRAVENOUS | Qty: 500 | Status: AC

## 2011-12-12 MED FILL — Heparin Sodium (Porcine) Inj 1000 Unit/ML: INTRAMUSCULAR | Qty: 10 | Status: AC

## 2011-12-12 MED FILL — Heparin Sodium (Porcine) Inj 1000 Unit/ML: INTRAMUSCULAR | Qty: 30 | Status: AC

## 2011-12-15 ENCOUNTER — Other Ambulatory Visit: Payer: Self-pay | Admitting: Surgery

## 2011-12-15 DIAGNOSIS — I2510 Atherosclerotic heart disease of native coronary artery without angina pectoris: Secondary | ICD-10-CM

## 2011-12-25 ENCOUNTER — Ambulatory Visit
Admission: RE | Admit: 2011-12-25 | Discharge: 2011-12-25 | Disposition: A | Discharge status: Home / Self Care | Payer: Medicare Other | Source: Ambulatory Visit | Attending: Surgery | Admitting: Surgery

## 2011-12-25 ENCOUNTER — Ambulatory Visit (INDEPENDENT_AMBULATORY_CARE_PROVIDER_SITE_OTHER): Payer: Self-pay | Admitting: Physician Assistant

## 2011-12-25 VITALS — BP 123/79 | HR 84 | Resp 18 | Ht 69.0 in | Wt 175.0 lb

## 2011-12-25 DIAGNOSIS — I2510 Atherosclerotic heart disease of native coronary artery without angina pectoris: Secondary | ICD-10-CM

## 2011-12-25 DIAGNOSIS — Z951 Presence of aortocoronary bypass graft: Secondary | ICD-10-CM

## 2011-12-25 DIAGNOSIS — Z09 Encounter for follow-up examination after completed treatment for conditions other than malignant neoplasm: Secondary | ICD-10-CM | POA: Diagnosis not present

## 2011-12-25 NOTE — Progress Notes (Signed)
  HPI:  Patient returns for routine postoperative follow-up having undergone emergent CABGx 3 by Dr. Laneta Simmers on 12/02/2011. The patient's early postoperative recovery while in the hospital was notable for bradycardia. Since hospital discharge the patient reports still with occasional sternal incisional pain.   Current Outpatient Prescriptions  Medication Sig Dispense Refill  . aspirin EC 325 MG EC tablet Take 1 tablet (325 mg total) by mouth daily.  30 tablet    . finasteride (PROSCAR) 5 MG tablet Take 5 mg by mouth daily.      Marland Kitchen guaiFENesin (MUCINEX) 600 MG 12 hr tablet Take 1 tablet (600 mg total) by mouth 2 (two) times daily. For cough      . hydrochlorothiazide (HYDRODIURIL) 25 MG tablet Take 12.5 mg by mouth daily.      . hydrOXYzine (ATARAX/VISTARIL) 25 MG tablet Take 50 mg by mouth 3 (three) times daily as needed. For allergies.      Marland Kitchen omeprazole (PRILOSEC) 20 MG capsule Take 20 mg by mouth 2 (two) times daily.      . rosuvastatin (CRESTOR) 10 MG tablet Take 1 tablet (10 mg total) by mouth daily at 6 PM.  30 tablet  1  . oxycodone (OXY-IR) 5 MG capsule Take 5 mg by mouth every 4 (four) hours as needed.      Vital Signs: BP 123/79, HR 84, RR 18, O2 sat 99% on room air  Physical Exam: Cardiovascular: Regular rate and rhythm. No murmurs, gallops, or rub. Pulmonary: Clear to auscultation bilaterally; no rales, wheezes, or rhonchi. Abdomen: Soft, nontender, bowel sounds present Extremities: No cyanosis, clubbing, or edema. Wounds: Sternum is solid. No signs of infection. Both sternal and right lower extremity wounds are clean and dry.  Diagnostic Tests: PA and LAT CXR shows the lungs to be clear and there is no pneumothorax.  Impression and Plan:  overall, Mr. Sylla continues to make steady progress status post emergent CABG x3 (s/p NSTEMI) to have bradycardia postoperatively and therefore is not placed on a beta blocker. His vital signs are stable and his heart rate is in the 80s. I  have given him a prescription to begin Lopressor 25 mg one half tablet by mouth 2 times daily. He has also requested a refill prescription for Oxycodone which he is only taking at night when necessary pain and he was given oxycodone 5 mg 1 tablet by mouth every 4 hours when necessary pain #30 with no refill. He has not made a fall to see the cardiologist yet. I contacted Dr. Hazle Coca office and he is going to see him on Monday at 10:30 am. He was instructed provided he is not taking any narcotics, he may begin driving short distances i.e. 30 minutes or less during the day and gradually increase his frequency and duration as tolerates. He was also started to continue with sternal precautions i.e. no lifting more than 10 pounds for the next 2-4 weeks. He was encouraged to dissipate in cardiac rehabilitation but has not been contacted yet. He will return to see Dr. Laneta Simmers on a when necessary basis.

## 2012-01-01 DIAGNOSIS — I10 Essential (primary) hypertension: Secondary | ICD-10-CM | POA: Diagnosis not present

## 2012-01-01 DIAGNOSIS — E782 Mixed hyperlipidemia: Secondary | ICD-10-CM | POA: Diagnosis not present

## 2012-01-01 DIAGNOSIS — I2510 Atherosclerotic heart disease of native coronary artery without angina pectoris: Secondary | ICD-10-CM | POA: Diagnosis not present

## 2012-01-09 ENCOUNTER — Other Ambulatory Visit: Payer: Self-pay

## 2012-01-09 DIAGNOSIS — G8918 Other acute postprocedural pain: Secondary | ICD-10-CM

## 2012-01-09 MED ORDER — HYDROCODONE-ACETAMINOPHEN 7.5-500 MG PO TABS: 1.0000 | ORAL_TABLET | Freq: Four times a day (QID) | ORAL | Status: AC | PRN

## 2012-02-09 DIAGNOSIS — I10 Essential (primary) hypertension: Secondary | ICD-10-CM | POA: Diagnosis not present

## 2012-02-09 DIAGNOSIS — E782 Mixed hyperlipidemia: Secondary | ICD-10-CM | POA: Diagnosis not present

## 2012-02-09 DIAGNOSIS — I2510 Atherosclerotic heart disease of native coronary artery without angina pectoris: Secondary | ICD-10-CM | POA: Diagnosis not present

## 2012-02-15 ENCOUNTER — Encounter (HOSPITAL_COMMUNITY)
Admission: RE | Admit: 2012-02-15 | Discharge: 2012-02-15 | Disposition: A | Discharge status: Home / Self Care | Payer: Medicare Other | Source: Ambulatory Visit | Attending: Cardiovascular Disease | Admitting: Cardiovascular Disease

## 2012-02-15 NOTE — Progress Notes (Signed)
Cardiac Rehab Medication Review by a Pharmacist  Does the patient  feel that his/her medications are working for him/her?  yes  Has the patient been experiencing any side effects to the medications prescribed?  no  Does the patient measure his/her own blood pressure or blood glucose at home?  no   Does the patient have any problems obtaining medications due to transportation or finances?   no  Understanding of regimen: good Understanding of indications: good Potential of compliance: good    Pharmacist comments: Open heart surgery on 1/26. On Aspirin and metoprolol and crestor    Janace Litten 02/15/2012 8:07 AM

## 2012-02-19 ENCOUNTER — Ambulatory Visit (HOSPITAL_COMMUNITY): Payer: Non-veteran care

## 2012-02-19 ENCOUNTER — Encounter (HOSPITAL_COMMUNITY): Payer: Medicare Other

## 2012-02-21 ENCOUNTER — Ambulatory Visit (HOSPITAL_COMMUNITY): Payer: Medicare Other

## 2012-02-21 ENCOUNTER — Encounter (HOSPITAL_COMMUNITY): Payer: Medicare Other

## 2012-02-23 ENCOUNTER — Ambulatory Visit (HOSPITAL_COMMUNITY): Payer: Non-veteran care

## 2012-02-23 ENCOUNTER — Encounter (HOSPITAL_COMMUNITY): Payer: Medicare Other

## 2012-02-23 ENCOUNTER — Ambulatory Visit (HOSPITAL_COMMUNITY): Payer: Medicare Other

## 2012-02-26 ENCOUNTER — Ambulatory Visit (HOSPITAL_COMMUNITY): Payer: Non-veteran care

## 2012-02-26 ENCOUNTER — Ambulatory Visit (HOSPITAL_COMMUNITY): Payer: Medicare Other

## 2012-02-26 ENCOUNTER — Encounter (HOSPITAL_COMMUNITY): Payer: Medicare Other

## 2012-02-28 ENCOUNTER — Ambulatory Visit (HOSPITAL_COMMUNITY): Payer: Medicare Other

## 2012-02-28 ENCOUNTER — Ambulatory Visit (HOSPITAL_COMMUNITY): Payer: Non-veteran care

## 2012-02-28 ENCOUNTER — Encounter (HOSPITAL_COMMUNITY): Payer: Medicare Other

## 2012-02-29 DIAGNOSIS — I2510 Atherosclerotic heart disease of native coronary artery without angina pectoris: Secondary | ICD-10-CM | POA: Diagnosis not present

## 2012-02-29 DIAGNOSIS — E119 Type 2 diabetes mellitus without complications: Secondary | ICD-10-CM | POA: Diagnosis not present

## 2012-02-29 DIAGNOSIS — E782 Mixed hyperlipidemia: Secondary | ICD-10-CM | POA: Diagnosis not present

## 2012-02-29 DIAGNOSIS — I10 Essential (primary) hypertension: Secondary | ICD-10-CM | POA: Diagnosis not present

## 2012-02-29 HISTORY — PX: US ECHOCARDIOGRAPHY: HXRAD669

## 2012-03-01 ENCOUNTER — Encounter (HOSPITAL_COMMUNITY): Payer: Medicare Other

## 2012-03-01 ENCOUNTER — Ambulatory Visit (HOSPITAL_COMMUNITY): Payer: Non-veteran care

## 2012-03-01 ENCOUNTER — Ambulatory Visit (HOSPITAL_COMMUNITY): Payer: Medicare Other

## 2012-03-04 ENCOUNTER — Ambulatory Visit (HOSPITAL_COMMUNITY): Payer: Medicare Other

## 2012-03-04 ENCOUNTER — Ambulatory Visit (HOSPITAL_COMMUNITY): Payer: Non-veteran care

## 2012-03-04 ENCOUNTER — Encounter (HOSPITAL_COMMUNITY): Payer: Medicare Other

## 2012-03-06 ENCOUNTER — Encounter (HOSPITAL_COMMUNITY): Payer: Medicare Other

## 2012-03-06 ENCOUNTER — Ambulatory Visit (HOSPITAL_COMMUNITY): Payer: Non-veteran care

## 2012-03-06 ENCOUNTER — Ambulatory Visit (HOSPITAL_COMMUNITY): Payer: Medicare Other

## 2012-03-06 DIAGNOSIS — I2510 Atherosclerotic heart disease of native coronary artery without angina pectoris: Secondary | ICD-10-CM | POA: Diagnosis not present

## 2012-03-06 DIAGNOSIS — I10 Essential (primary) hypertension: Secondary | ICD-10-CM | POA: Diagnosis not present

## 2012-03-08 ENCOUNTER — Encounter (HOSPITAL_COMMUNITY): Payer: Medicare Other

## 2012-03-08 ENCOUNTER — Ambulatory Visit (HOSPITAL_COMMUNITY): Payer: Medicare Other

## 2012-03-08 ENCOUNTER — Ambulatory Visit (HOSPITAL_COMMUNITY): Payer: Non-veteran care

## 2012-03-11 ENCOUNTER — Ambulatory Visit (HOSPITAL_COMMUNITY): Payer: Medicare Other

## 2012-03-11 ENCOUNTER — Encounter (HOSPITAL_COMMUNITY): Payer: Medicare Other

## 2012-03-11 ENCOUNTER — Ambulatory Visit (HOSPITAL_COMMUNITY): Payer: Non-veteran care

## 2012-03-13 ENCOUNTER — Encounter (HOSPITAL_COMMUNITY): Payer: Medicare Other

## 2012-03-13 ENCOUNTER — Ambulatory Visit (HOSPITAL_COMMUNITY): Payer: Medicare Other

## 2012-03-13 ENCOUNTER — Ambulatory Visit (HOSPITAL_COMMUNITY): Payer: Non-veteran care

## 2012-03-15 ENCOUNTER — Ambulatory Visit (HOSPITAL_COMMUNITY): Payer: Medicare Other

## 2012-03-15 ENCOUNTER — Encounter (HOSPITAL_COMMUNITY): Payer: Medicare Other

## 2012-03-15 ENCOUNTER — Ambulatory Visit (HOSPITAL_COMMUNITY): Payer: Non-veteran care

## 2012-03-18 ENCOUNTER — Ambulatory Visit (HOSPITAL_COMMUNITY): Payer: Non-veteran care

## 2012-03-18 ENCOUNTER — Ambulatory Visit (HOSPITAL_COMMUNITY): Payer: Medicare Other

## 2012-03-18 ENCOUNTER — Encounter (HOSPITAL_COMMUNITY): Payer: Medicare Other

## 2012-03-20 ENCOUNTER — Encounter (HOSPITAL_COMMUNITY): Payer: Medicare Other

## 2012-03-20 ENCOUNTER — Ambulatory Visit (HOSPITAL_COMMUNITY): Payer: Medicare Other

## 2012-03-20 ENCOUNTER — Ambulatory Visit (HOSPITAL_COMMUNITY): Payer: Non-veteran care

## 2012-03-22 ENCOUNTER — Encounter (HOSPITAL_COMMUNITY): Payer: Medicare Other

## 2012-03-22 ENCOUNTER — Ambulatory Visit (HOSPITAL_COMMUNITY): Payer: Non-veteran care

## 2012-03-22 ENCOUNTER — Ambulatory Visit (HOSPITAL_COMMUNITY): Payer: Medicare Other

## 2012-03-25 ENCOUNTER — Ambulatory Visit (HOSPITAL_COMMUNITY): Payer: Non-veteran care

## 2012-03-25 ENCOUNTER — Encounter (HOSPITAL_COMMUNITY): Payer: Medicare Other

## 2012-03-25 ENCOUNTER — Ambulatory Visit (HOSPITAL_COMMUNITY): Payer: Medicare Other

## 2012-03-27 ENCOUNTER — Encounter (HOSPITAL_COMMUNITY): Payer: Medicare Other

## 2012-03-27 ENCOUNTER — Ambulatory Visit (HOSPITAL_COMMUNITY): Payer: Non-veteran care

## 2012-03-27 ENCOUNTER — Ambulatory Visit (HOSPITAL_COMMUNITY): Payer: Medicare Other

## 2012-03-29 ENCOUNTER — Ambulatory Visit (HOSPITAL_COMMUNITY): Payer: Non-veteran care

## 2012-03-29 ENCOUNTER — Encounter (HOSPITAL_COMMUNITY): Payer: Medicare Other

## 2012-03-29 ENCOUNTER — Ambulatory Visit (HOSPITAL_COMMUNITY): Payer: Medicare Other

## 2012-04-01 ENCOUNTER — Ambulatory Visit (HOSPITAL_COMMUNITY): Payer: Medicare Other

## 2012-04-03 ENCOUNTER — Encounter (HOSPITAL_COMMUNITY): Payer: Medicare Other

## 2012-04-03 ENCOUNTER — Ambulatory Visit (HOSPITAL_COMMUNITY): Payer: Non-veteran care

## 2012-04-03 ENCOUNTER — Ambulatory Visit (HOSPITAL_COMMUNITY): Payer: Medicare Other

## 2012-04-05 ENCOUNTER — Ambulatory Visit (HOSPITAL_COMMUNITY): Payer: Non-veteran care

## 2012-04-05 ENCOUNTER — Encounter (HOSPITAL_COMMUNITY): Payer: Medicare Other

## 2012-04-05 ENCOUNTER — Ambulatory Visit (HOSPITAL_COMMUNITY): Payer: Medicare Other

## 2012-04-08 ENCOUNTER — Ambulatory Visit (HOSPITAL_COMMUNITY): Payer: Medicare Other

## 2012-04-08 ENCOUNTER — Ambulatory Visit (HOSPITAL_COMMUNITY): Payer: Non-veteran care

## 2012-04-08 ENCOUNTER — Encounter (HOSPITAL_COMMUNITY): Payer: Medicare Other

## 2012-04-10 ENCOUNTER — Ambulatory Visit (HOSPITAL_COMMUNITY): Payer: Non-veteran care

## 2012-04-10 ENCOUNTER — Ambulatory Visit (HOSPITAL_COMMUNITY): Payer: Medicare Other

## 2012-04-10 ENCOUNTER — Encounter (HOSPITAL_COMMUNITY): Payer: Medicare Other

## 2012-04-12 ENCOUNTER — Encounter (HOSPITAL_COMMUNITY): Payer: Medicare Other

## 2012-04-12 ENCOUNTER — Ambulatory Visit (HOSPITAL_COMMUNITY): Payer: Medicare Other

## 2012-04-12 ENCOUNTER — Ambulatory Visit (HOSPITAL_COMMUNITY): Payer: Non-veteran care

## 2012-04-15 ENCOUNTER — Ambulatory Visit (HOSPITAL_COMMUNITY): Payer: Medicare Other

## 2012-04-15 ENCOUNTER — Encounter (HOSPITAL_COMMUNITY): Payer: Medicare Other

## 2012-04-15 ENCOUNTER — Ambulatory Visit (HOSPITAL_COMMUNITY): Payer: Non-veteran care

## 2012-04-17 ENCOUNTER — Encounter (HOSPITAL_COMMUNITY): Payer: Medicare Other

## 2012-04-17 ENCOUNTER — Ambulatory Visit (HOSPITAL_COMMUNITY): Payer: Medicare Other

## 2012-04-17 ENCOUNTER — Ambulatory Visit (HOSPITAL_COMMUNITY): Payer: Non-veteran care

## 2012-04-19 ENCOUNTER — Ambulatory Visit (HOSPITAL_COMMUNITY): Payer: Medicare Other

## 2012-04-19 ENCOUNTER — Encounter (HOSPITAL_COMMUNITY): Payer: Medicare Other

## 2012-04-19 ENCOUNTER — Ambulatory Visit (HOSPITAL_COMMUNITY): Payer: Non-veteran care

## 2012-04-22 ENCOUNTER — Ambulatory Visit (HOSPITAL_COMMUNITY): Payer: Medicare Other

## 2012-04-22 ENCOUNTER — Ambulatory Visit (HOSPITAL_COMMUNITY): Payer: Non-veteran care

## 2012-04-22 ENCOUNTER — Encounter (HOSPITAL_COMMUNITY): Payer: Medicare Other

## 2012-04-24 ENCOUNTER — Ambulatory Visit (HOSPITAL_COMMUNITY): Payer: Medicare Other

## 2012-04-24 ENCOUNTER — Ambulatory Visit (HOSPITAL_COMMUNITY): Payer: Non-veteran care

## 2012-04-24 ENCOUNTER — Encounter (HOSPITAL_COMMUNITY): Payer: Medicare Other

## 2012-04-26 ENCOUNTER — Encounter (HOSPITAL_COMMUNITY): Payer: Medicare Other

## 2012-04-26 ENCOUNTER — Ambulatory Visit (HOSPITAL_COMMUNITY): Payer: Non-veteran care

## 2012-04-26 ENCOUNTER — Ambulatory Visit (HOSPITAL_COMMUNITY): Payer: Medicare Other

## 2012-04-29 ENCOUNTER — Ambulatory Visit (HOSPITAL_COMMUNITY): Payer: Medicare Other

## 2012-04-29 ENCOUNTER — Ambulatory Visit (HOSPITAL_COMMUNITY): Payer: Non-veteran care

## 2012-04-29 ENCOUNTER — Encounter (HOSPITAL_COMMUNITY): Payer: Medicare Other

## 2012-05-01 ENCOUNTER — Encounter (HOSPITAL_COMMUNITY): Payer: Medicare Other

## 2012-05-01 ENCOUNTER — Ambulatory Visit (HOSPITAL_COMMUNITY): Payer: Medicare Other

## 2012-05-01 ENCOUNTER — Ambulatory Visit (HOSPITAL_COMMUNITY): Payer: Non-veteran care

## 2012-05-03 ENCOUNTER — Ambulatory Visit (HOSPITAL_COMMUNITY): Payer: Medicare Other

## 2012-05-03 ENCOUNTER — Encounter (HOSPITAL_COMMUNITY): Payer: Medicare Other

## 2012-05-03 ENCOUNTER — Ambulatory Visit (HOSPITAL_COMMUNITY): Payer: Non-veteran care

## 2012-05-06 ENCOUNTER — Ambulatory Visit (HOSPITAL_COMMUNITY): Payer: Medicare Other

## 2012-05-06 ENCOUNTER — Ambulatory Visit (HOSPITAL_COMMUNITY): Payer: Non-veteran care

## 2012-05-06 ENCOUNTER — Encounter (HOSPITAL_COMMUNITY): Payer: Medicare Other

## 2012-05-06 DIAGNOSIS — Z79899 Other long term (current) drug therapy: Secondary | ICD-10-CM | POA: Diagnosis not present

## 2012-05-06 DIAGNOSIS — E782 Mixed hyperlipidemia: Secondary | ICD-10-CM | POA: Diagnosis not present

## 2012-05-08 ENCOUNTER — Ambulatory Visit (HOSPITAL_COMMUNITY): Payer: Non-veteran care

## 2012-05-08 ENCOUNTER — Ambulatory Visit (HOSPITAL_COMMUNITY): Payer: Medicare Other

## 2012-05-08 ENCOUNTER — Encounter (HOSPITAL_COMMUNITY): Payer: Medicare Other

## 2012-05-10 ENCOUNTER — Ambulatory Visit (HOSPITAL_COMMUNITY): Payer: Medicare Other

## 2012-05-10 ENCOUNTER — Encounter (HOSPITAL_COMMUNITY): Payer: Medicare Other

## 2012-05-10 ENCOUNTER — Ambulatory Visit (HOSPITAL_COMMUNITY): Payer: Non-veteran care

## 2012-05-13 ENCOUNTER — Ambulatory Visit (HOSPITAL_COMMUNITY): Payer: Non-veteran care

## 2012-05-13 ENCOUNTER — Encounter (HOSPITAL_COMMUNITY): Payer: Medicare Other

## 2012-05-13 ENCOUNTER — Ambulatory Visit (HOSPITAL_COMMUNITY): Payer: Medicare Other

## 2012-05-15 ENCOUNTER — Ambulatory Visit (HOSPITAL_COMMUNITY): Payer: Non-veteran care

## 2012-05-15 ENCOUNTER — Encounter (HOSPITAL_COMMUNITY): Payer: Medicare Other

## 2012-05-15 ENCOUNTER — Ambulatory Visit (HOSPITAL_COMMUNITY): Payer: Medicare Other

## 2012-05-15 DIAGNOSIS — I10 Essential (primary) hypertension: Secondary | ICD-10-CM | POA: Diagnosis not present

## 2012-05-15 DIAGNOSIS — E782 Mixed hyperlipidemia: Secondary | ICD-10-CM | POA: Diagnosis not present

## 2012-05-15 DIAGNOSIS — I2510 Atherosclerotic heart disease of native coronary artery without angina pectoris: Secondary | ICD-10-CM | POA: Diagnosis not present

## 2012-05-17 ENCOUNTER — Ambulatory Visit (HOSPITAL_COMMUNITY): Payer: Medicare Other

## 2012-05-17 ENCOUNTER — Encounter (HOSPITAL_COMMUNITY): Payer: Medicare Other

## 2012-05-17 ENCOUNTER — Ambulatory Visit (HOSPITAL_COMMUNITY): Payer: Non-veteran care

## 2012-05-20 ENCOUNTER — Ambulatory Visit (HOSPITAL_COMMUNITY): Payer: Medicare Other

## 2012-05-20 ENCOUNTER — Ambulatory Visit (HOSPITAL_COMMUNITY): Payer: Non-veteran care

## 2012-05-20 ENCOUNTER — Encounter (HOSPITAL_COMMUNITY): Payer: Medicare Other

## 2012-05-22 ENCOUNTER — Ambulatory Visit (HOSPITAL_COMMUNITY): Payer: Medicare Other

## 2012-05-22 ENCOUNTER — Encounter (HOSPITAL_COMMUNITY): Payer: Medicare Other

## 2012-05-22 ENCOUNTER — Ambulatory Visit (HOSPITAL_COMMUNITY): Payer: Non-veteran care

## 2012-05-24 ENCOUNTER — Ambulatory Visit (HOSPITAL_COMMUNITY): Payer: Non-veteran care

## 2012-05-24 ENCOUNTER — Ambulatory Visit (HOSPITAL_COMMUNITY): Payer: Medicare Other

## 2012-05-24 ENCOUNTER — Encounter (HOSPITAL_COMMUNITY): Payer: Medicare Other

## 2012-08-29 ENCOUNTER — Encounter (HOSPITAL_COMMUNITY): Payer: Self-pay

## 2012-08-29 ENCOUNTER — Emergency Department (HOSPITAL_COMMUNITY): Payer: Medicare Other

## 2012-08-29 ENCOUNTER — Inpatient Hospital Stay (HOSPITAL_COMMUNITY)
Admission: EM | Admit: 2012-08-29 | Discharge: 2012-09-03 | DRG: 281 | Disposition: A | Discharge status: Home / Self Care | Payer: Medicare Other | Attending: Internal Medicine | Admitting: Internal Medicine

## 2012-08-29 ENCOUNTER — Encounter (HOSPITAL_COMMUNITY)
Admission: EM | Disposition: A | Discharge status: Home / Self Care | Payer: Self-pay | Source: Home / Self Care | Attending: Internal Medicine

## 2012-08-29 DIAGNOSIS — R0602 Shortness of breath: Secondary | ICD-10-CM | POA: Diagnosis not present

## 2012-08-29 DIAGNOSIS — E7800 Pure hypercholesterolemia, unspecified: Secondary | ICD-10-CM | POA: Diagnosis present

## 2012-08-29 DIAGNOSIS — Z951 Presence of aortocoronary bypass graft: Secondary | ICD-10-CM

## 2012-08-29 DIAGNOSIS — R7989 Other specified abnormal findings of blood chemistry: Secondary | ICD-10-CM

## 2012-08-29 DIAGNOSIS — I252 Old myocardial infarction: Secondary | ICD-10-CM

## 2012-08-29 DIAGNOSIS — I214 Non-ST elevation (NSTEMI) myocardial infarction: Principal | ICD-10-CM

## 2012-08-29 DIAGNOSIS — F17200 Nicotine dependence, unspecified, uncomplicated: Secondary | ICD-10-CM

## 2012-08-29 DIAGNOSIS — I2510 Atherosclerotic heart disease of native coronary artery without angina pectoris: Secondary | ICD-10-CM | POA: Diagnosis present

## 2012-08-29 DIAGNOSIS — Z87891 Personal history of nicotine dependence: Secondary | ICD-10-CM | POA: Diagnosis present

## 2012-08-29 DIAGNOSIS — M5030 Other cervical disc degeneration, unspecified cervical region: Secondary | ICD-10-CM | POA: Diagnosis present

## 2012-08-29 DIAGNOSIS — R112 Nausea with vomiting, unspecified: Secondary | ICD-10-CM | POA: Diagnosis not present

## 2012-08-29 DIAGNOSIS — I10 Essential (primary) hypertension: Secondary | ICD-10-CM

## 2012-08-29 DIAGNOSIS — I25810 Atherosclerosis of coronary artery bypass graft(s) without angina pectoris: Secondary | ICD-10-CM | POA: Diagnosis present

## 2012-08-29 DIAGNOSIS — Z79899 Other long term (current) drug therapy: Secondary | ICD-10-CM

## 2012-08-29 DIAGNOSIS — R079 Chest pain, unspecified: Secondary | ICD-10-CM | POA: Diagnosis not present

## 2012-08-29 DIAGNOSIS — Z7902 Long term (current) use of antithrombotics/antiplatelets: Secondary | ICD-10-CM

## 2012-08-29 DIAGNOSIS — I219 Acute myocardial infarction, unspecified: Secondary | ICD-10-CM

## 2012-08-29 DIAGNOSIS — Z7982 Long term (current) use of aspirin: Secondary | ICD-10-CM

## 2012-08-29 HISTORY — DX: Other cervical disc degeneration, unspecified cervical region: M50.30

## 2012-08-29 HISTORY — DX: Unspecified malaria: B54

## 2012-08-29 HISTORY — DX: Non-ST elevation (NSTEMI) myocardial infarction: I21.4

## 2012-08-29 HISTORY — DX: Post-traumatic stress disorder, unspecified: F43.10

## 2012-08-29 HISTORY — DX: Unspecified osteoarthritis, unspecified site: M19.90

## 2012-08-29 HISTORY — DX: Atherosclerotic heart disease of native coronary artery without angina pectoris: I25.10

## 2012-08-29 HISTORY — DX: Angina pectoris, unspecified: I20.9

## 2012-08-29 LAB — CBC
HCT: 39.5 % (ref 39.0–52.0)
Hemoglobin: 15 g/dL (ref 13.0–17.0)
Hemoglobin: 13.9 g/dL (ref 13.0–17.0)
MCHC: 36 g/dL (ref 30.0–36.0)
MCHC: 35.2 g/dL (ref 30.0–36.0)
RBC: 4.74 MIL/uL (ref 4.22–5.81)
WBC: 10.5 10*3/uL (ref 4.0–10.5)
WBC: 10.6 10*3/uL — ABNORMAL HIGH (ref 4.0–10.5)

## 2012-08-29 LAB — BASIC METABOLIC PANEL
CO2: 23 mEq/L (ref 19–32)
CO2: 26 mEq/L (ref 19–32)
Chloride: 102 mEq/L (ref 96–112)
Chloride: 103 mEq/L (ref 96–112)
GFR calc non Af Amer: 87 mL/min — ABNORMAL LOW (ref >90)
Glucose, Bld: 119 mg/dL — ABNORMAL HIGH (ref 70–99)
Glucose, Bld: 132 mg/dL — ABNORMAL HIGH (ref 70–99)
Potassium: 3.9 mEq/L (ref 3.5–5.1)
Potassium: 3.3 mEq/L — ABNORMAL LOW (ref 3.5–5.1)
Sodium: 139 mEq/L (ref 135–145)
Sodium: 138 mEq/L (ref 135–145)

## 2012-08-29 LAB — POCT I-STAT TROPONIN I

## 2012-08-29 LAB — PROTIME-INR
INR: 1.07 (ref 0.00–1.49)
Prothrombin Time: 13.8 seconds (ref 11.6–15.2)

## 2012-08-29 SURGERY — LEFT HEART CATHETERIZATION WITH CORONARY ANGIOGRAM
Anesthesia: LOCAL

## 2012-08-29 MED ORDER — TERAZOSIN HCL 5 MG PO CAPS
15.0000 mg | ORAL_CAPSULE | Freq: Every day | ORAL | Status: DC
  Administered 2012-08-29: 15 mg via ORAL
  Filled 2012-08-29 (×3): qty 3

## 2012-08-29 MED ORDER — HYDROCHLOROTHIAZIDE 25 MG PO TABS
12.5000 mg | ORAL_TABLET | Freq: Every day | ORAL | Status: DC
  Filled 2012-08-29: qty 0.5

## 2012-08-29 MED ORDER — POTASSIUM CHLORIDE CRYS ER 20 MEQ PO TBCR
40.0000 meq | EXTENDED_RELEASE_TABLET | Freq: Once | ORAL | Status: AC
  Administered 2012-08-29: 40 meq via ORAL
  Filled 2012-08-29 (×3): qty 2

## 2012-08-29 MED ORDER — ONDANSETRON HCL 4 MG/2ML IJ SOLN: 4.0000 mg | Freq: Four times a day (QID) | INTRAMUSCULAR | Status: DC | PRN

## 2012-08-29 MED ORDER — SODIUM CHLORIDE 0.9 % IV SOLN: 250.0000 mL | INTRAVENOUS | Status: DC | PRN

## 2012-08-29 MED ORDER — SODIUM CHLORIDE 0.9 % IJ SOLN: 3.0000 mL | INTRAMUSCULAR | Status: DC | PRN

## 2012-08-29 MED ORDER — ALPRAZOLAM 0.25 MG PO TABS: 0.2500 mg | ORAL_TABLET | Freq: Three times a day (TID) | ORAL | Status: DC | PRN

## 2012-08-29 MED ORDER — CLOPIDOGREL BISULFATE 75 MG PO TABS
75.0000 mg | ORAL_TABLET | Freq: Once | ORAL | Status: AC
  Administered 2012-08-29: 75 mg via ORAL
  Filled 2012-08-29: qty 1

## 2012-08-29 MED ORDER — DIAZEPAM 5 MG PO TABS
5.0000 mg | ORAL_TABLET | ORAL | Status: AC
  Administered 2012-08-30: 5 mg via ORAL
  Filled 2012-08-29: qty 1

## 2012-08-29 MED ORDER — ATORVASTATIN CALCIUM 20 MG PO TABS
20.0000 mg | ORAL_TABLET | Freq: Every day | ORAL | Status: DC
  Administered 2012-08-29: 20 mg via ORAL
  Filled 2012-08-29 (×3): qty 1

## 2012-08-29 MED ORDER — ACETAMINOPHEN 500 MG PO TABS
500.0000 mg | ORAL_TABLET | Freq: Four times a day (QID) | ORAL | Status: DC | PRN
  Filled 2012-08-29: qty 1

## 2012-08-29 MED ORDER — NITROGLYCERIN 0.4 MG SL SUBL
0.4000 mg | SUBLINGUAL_TABLET | SUBLINGUAL | Status: DC | PRN
  Administered 2012-09-01: 0.4 mg via SUBLINGUAL
  Filled 2012-08-29: qty 25

## 2012-08-29 MED ORDER — PANTOPRAZOLE SODIUM 40 MG PO TBEC
40.0000 mg | DELAYED_RELEASE_TABLET | Freq: Every day | ORAL | Status: DC
  Administered 2012-08-30 – 2012-09-03 (×5): 40 mg via ORAL
  Filled 2012-08-29 (×5): qty 1

## 2012-08-29 MED ORDER — METOPROLOL TARTRATE 12.5 MG HALF TABLET
12.5000 mg | ORAL_TABLET | Freq: Two times a day (BID) | ORAL | Status: DC
  Administered 2012-08-29: 12.5 mg via ORAL
  Filled 2012-08-29 (×4): qty 1

## 2012-08-29 MED ORDER — NITROGLYCERIN IN D5W 200-5 MCG/ML-% IV SOLN
3.0000 ug/min | INTRAVENOUS | Status: DC
  Administered 2012-08-29: 3 ug/min via INTRAVENOUS
  Filled 2012-08-29: qty 250

## 2012-08-29 MED ORDER — ACETAMINOPHEN 325 MG PO TABS: 650.0000 mg | ORAL_TABLET | ORAL | Status: DC | PRN

## 2012-08-29 MED ORDER — HEPARIN (PORCINE) IN NACL 100-0.45 UNIT/ML-% IJ SOLN
1300.0000 [IU]/h | INTRAMUSCULAR | Status: DC
  Administered 2012-08-29 – 2012-08-30 (×2): 1300 [IU]/h via INTRAVENOUS
  Filled 2012-08-29 (×6): qty 250

## 2012-08-29 MED ORDER — FINASTERIDE 5 MG PO TABS
5.0000 mg | ORAL_TABLET | Freq: Every day | ORAL | Status: DC
  Administered 2012-08-30 – 2012-09-03 (×5): 5 mg via ORAL
  Filled 2012-08-29 (×5): qty 1

## 2012-08-29 MED ORDER — HEPARIN BOLUS VIA INFUSION
4000.0000 [IU] | Freq: Once | INTRAVENOUS | Status: AC
  Administered 2012-08-29: 4000 [IU] via INTRAVENOUS

## 2012-08-29 MED ORDER — ZOLPIDEM TARTRATE 5 MG PO TABS: 5.0000 mg | ORAL_TABLET | Freq: Every evening | ORAL | Status: DC | PRN

## 2012-08-29 MED ORDER — HYDROXYZINE HCL 50 MG PO TABS
50.0000 mg | ORAL_TABLET | Freq: Three times a day (TID) | ORAL | Status: DC | PRN
  Filled 2012-08-29: qty 1

## 2012-08-29 MED ORDER — HYDROCODONE-ACETAMINOPHEN 5-325 MG PO TABS
1.0000 | ORAL_TABLET | Freq: Four times a day (QID) | ORAL | Status: DC | PRN
  Administered 2012-09-01: 1 via ORAL
  Filled 2012-08-29: qty 1

## 2012-08-29 MED ORDER — ASPIRIN EC 325 MG PO TBEC
325.0000 mg | DELAYED_RELEASE_TABLET | Freq: Every day | ORAL | Status: DC
  Administered 2012-08-30: 325 mg via ORAL
  Filled 2012-08-29: qty 1

## 2012-08-29 MED ORDER — HYDROCHLOROTHIAZIDE 12.5 MG PO CAPS
12.5000 mg | ORAL_CAPSULE | Freq: Every day | ORAL | Status: DC
  Administered 2012-08-31 – 2012-09-03 (×4): 12.5 mg via ORAL
  Filled 2012-08-29 (×5): qty 1

## 2012-08-29 NOTE — H&P (Signed)
[facility]    ADMISSION HISTORY & PHYSICAL   Chief Complaint:  Chest pain  Cardiologist: Dr. Allyson Sabal  Primary Care Physician: [facility]  HPI:  This is a 66 y.o. male with a past medical history significant for acute anterior STEMI in January of 2013.  EKG at that time showed marked anterolateral ST depression and cardiac enzymes were positive. Code STEMI called and cath showed 99% ostial Left Main, moderate mid LAD stenosis, and moderate RCA stenosis. LV gram shows anteroapical hypokinesis. IABP inserted for anatomy and ongoing chest pain.  He was taken emergently to the OR and underwent 3 vessel CABG (LIMA to LAD, SVG to OM and SVG to RCA).  He apparently has been doing well and recently saw Dr. Allyson Sabal in the office. He now presents to the ER with 2 days of chest pain. He did not take nitroglycerin however, the pain became severe and he came via EMS. Intially, code STEMI was called in the ER.  I have reviewed the EKG which shows 1 mm ST elevation in AVR and anterolateral ST depression <57mm. This is not consistent with STEMI, in fact, the EKG looks better than in January. Despite this, he has had a NSTEMI. This may represent graft disease or closure. He is chest pain free now. We are asked to admit.  PMHx:  Past Medical History  Diagnosis Date  . Hypertension   . Hypercholesterolemia   . MI (myocardial infarction)   . DDD (degenerative disc disease), cervical     Past Surgical History  Procedure Date  . Back surgery   . Coronary artery bypass graft 12/02/2011    Procedure: CORONARY ARTERY BYPASS GRAFTING (CABG);  Surgeon: Alleen Borne, MD;  Location: [facility] OR;  Service: Open Heart Surgery;  Laterality: N/A;    FAMHx:  History reviewed. No pertinent family history.  SOCHx:   reports that he quit smoking about 8 months ago. His smoking use included Cigarettes. He has never used smokeless tobacco. He reports that he does not drink alcohol or use illicit  drugs.  ALLERGIES:  No Known Allergies  ROS: A comprehensive review of systems was negative except for: Cardiovascular: positive for chest pain  HOME MEDS:    . aspirin EC  325 mg Oral Daily  . atorvastatin  20 mg Oral q1800  . clopidogrel  75 mg Oral Once  . finasteride  5 mg Oral Daily  . hydrochlorothiazide  12.5 mg Oral Daily  . metoprolol tartrate  12.5 mg Oral BID  . pantoprazole  40 mg Oral Daily  . terazosin  15 mg Oral QHS    LABS/IMAGING: Results for orders placed during the hospital encounter of 08/29/12 (from the past 48 hour(s))  CBC     Status: Normal   Collection Time   08/29/12  3:28 PM      Component Value Range Comment   WBC 10.5  4.0 - 10.5 K/uL    RBC 4.74  4.22 - 5.81 MIL/uL    Hemoglobin 15.0  13.0 - 17.0 g/dL    HCT 16.1  09.6 - 04.5 %    MCV 88.0  78.0 - 100.0 fL    MCH 31.6  26.0 - 34.0 pg    MCHC 36.0  30.0 - 36.0 g/dL    RDW 40.9  81.1 - 91.4 %    Platelets 173  150 - 400 K/uL   BASIC METABOLIC PANEL     Status: Abnormal   Collection  Time   08/29/12  3:28 PM      Component Value Range Comment   Sodium 139  135 - 145 mEq/L    Potassium 3.9  3.5 - 5.1 mEq/L    Chloride 102  96 - 112 mEq/L    CO2 23  19 - 32 mEq/L    Glucose, Bld 119 (*) 70 - 99 mg/dL    BUN 21  6 - 23 mg/dL    Creatinine, Ser 1.61  0.50 - 1.35 mg/dL    Calcium 9.6  8.4 - 09.6 mg/dL    GFR calc non Af Amer 87 (*) >90 mL/min    GFR calc Af Amer >90  >90 mL/min   POCT I-STAT TROPONIN I     Status: Abnormal   Collection Time   08/29/12  3:55 PM      Component Value Range Comment   Troponin i, poc 0.57 (*) 0.00 - 0.08 ng/mL    Comment NOTIFIED PHYSICIAN      Comment 3             Dg Chest Port 1 View  08/29/2012  *RADIOLOGY REPORT*  Clinical Data: Mid chest pain  PORTABLE CHEST - 1 VIEW  Comparison: Portable exam 1536 hours compared to 12/25/2011  Findings: Mild enlargement of cardiac silhouette post CABG. Mediastinal contours and pulmonary vascularity normal. Mild  atherosclerotic calcification aortic arch. Lungs grossly clear. No pleural effusion, pneumothorax or acute osseous findings.  IMPRESSION: Minimal enlargement of cardiac silhouette post CABG. No acute abnormalities.   Original Report Authenticated By: Lollie Marrow, M.D.     VITALS: Blood pressure 130/77, pulse 77, temperature 97.7 F (36.5 C), temperature source Oral, resp. rate 20, height 5\' 9"  (1.753 m), weight 86.183 kg (190 lb), SpO2 100.00%.  EXAM: General appearance: alert and no distress Neck: no adenopathy, no carotid bruit, no JVD, supple, symmetrical, trachea midline and thyroid not enlarged, symmetric, no tenderness/mass/nodules Lungs: clear to auscultation bilaterally Heart: regular rate and rhythm, S1, S2 normal, no murmur, click, rub or gallop Abdomen: soft, non-tender; bowel sounds normal; no masses,  no organomegaly Extremities: extremities normal, atraumatic, no cyanosis or edema Pulses: 2+ and symmetric Skin: Skin color, texture, turgor normal. No rashes or lesions Neurologic: Grossly normal  IMPRESSION: 1.   NSTEMI 2.   CAD s/p emergent CABG 11/2011 for STEMI  PLAN: 1. Will admit for acute coronary syndrome. Plan IV heparin and nitroglycerin. NPO after midnight for [employer] tomorrow.  Chrystie Nose, MD, [facility] Attending Cardiologist [facility]  Konstantina Nachreiner C 08/29/2012, 4:52 PM

## 2012-08-29 NOTE — ED Provider Notes (Signed)
History     CSN: [id number]  Arrival date & time 08/29/12  1512   First MD Initiated Contact with Patient 08/29/12 1553      Chief Complaint  Patient presents with  . Chest Pain    (Consider location/radiation/quality/duration/timing/severity/associated sxs/prior treatment) Patient is a 66 y.o. male presenting with chest pain. The history is provided by the patient. No language interpreter was used.  Chest Pain The chest pain began yesterday. Duration of episode(s) is 1 day. Chest pain occurs constantly. The chest pain is worsening. The pain is associated with exertion. At its most intense, the pain is at 10/10. The pain is currently at 4/10. The severity of the pain is moderate. The quality of the pain is described as tightness. The pain radiates to the left jaw and right jaw. Chest pain is worsened by exertion. Primary symptoms include shortness of breath, nausea and vomiting. Pertinent negatives for primary symptoms include no syncope, no cough, no abdominal pain and no dizziness.  Associated symptoms include diaphoresis.  Pertinent negatives for associated symptoms include no lower extremity edema and no near-syncope. He tried nitroglycerin for the symptoms. Risk factors include male gender.  His past medical history is significant for CAD and MI.  Procedure history is positive for cardiac catheterization.     Past Medical History  Diagnosis Date  . Hypertension   . Hypercholesterolemia   . MI (myocardial infarction)   . DDD (degenerative disc disease), cervical     Past Surgical History  Procedure Date  . Back surgery   . Coronary artery bypass graft 12/02/2011    Procedure: CORONARY ARTERY BYPASS GRAFTING (CABG);  Surgeon: Alleen Borne, MD;  Location: [facility] OR;  Service: Open Heart Surgery;  Laterality: N/A;    History reviewed. No pertinent family history.  History  Substance Use Topics  . Smoking status: Former Smoker    Types: Cigarettes    Quit date: 12/02/2011    . Smokeless tobacco: Never Used  . Alcohol Use: No      Review of Systems  Constitutional: Positive for diaphoresis. Negative for activity change and appetite change.  HENT: Negative for sore throat and neck pain.   Eyes: Negative for discharge and visual disturbance.  Respiratory: Positive for shortness of breath. Negative for cough and choking.   Cardiovascular: Positive for chest pain. Negative for leg swelling, syncope and near-syncope.  Gastrointestinal: Positive for nausea and vomiting. Negative for abdominal pain, diarrhea and constipation.  Genitourinary: Negative for dysuria and difficulty urinating.  Musculoskeletal: Negative for back pain and arthralgias.  Skin: Negative for color change and pallor.  Neurological: Negative for dizziness, speech difficulty and light-headedness.  Psychiatric/Behavioral: Negative for behavioral problems and agitation.  All other systems reviewed and are negative.    Allergies  Review of patient's allergies indicates no known allergies.  Home Medications   Current Outpatient Rx  Name Route Sig Dispense Refill  . ASPIRIN 325 MG PO TBEC Oral Take 1 tablet (325 mg total) by mouth daily. 30 tablet   . FINASTERIDE 5 MG PO TABS Oral Take 5 mg by mouth daily.    . GUAIFENESIN ER 600 MG PO TB12 Oral Take 600 mg by mouth 2 (two) times daily. For cough    . HYDROCHLOROTHIAZIDE 25 MG PO TABS Oral Take 12.5 mg by mouth daily.    Marland Kitchen HYDROCODONE-ACETAMINOPHEN 5-500 MG PO TABS Oral Take 1 tablet by mouth every 6 (six) hours as needed. For pain    . HYDROXYZINE HCL 25  MG PO TABS Oral Take 50 mg by mouth 3 (three) times daily as needed. For allergies.    Marland Kitchen METOPROLOL TARTRATE 25 MG PO TABS Oral Take 12.5 mg by mouth 2 (two) times daily.    Marland Kitchen OMEPRAZOLE 20 MG PO CPDR Oral Take 20 mg by mouth 2 (two) times daily.    Marland Kitchen ROSUVASTATIN CALCIUM 10 MG PO TABS Oral Take 1 tablet (10 mg total) by mouth daily at 6 PM. 30 tablet 1  . TERAZOSIN HCL 5 MG PO CAPS Oral  Take 15 mg by mouth at bedtime.      BP 130/77  Pulse 77  Temp 97.7 F (36.5 C) (Oral)  Resp 20  Ht 5\' 9"  (1.753 m)  Wt 190 lb (86.183 kg)  BMI 28.06 kg/m2  SpO2 100%  Physical Exam  Constitutional: He appears well-developed. No distress.  HENT:  Head: Normocephalic and atraumatic.  Mouth/Throat: No oropharyngeal exudate.  Eyes: EOM are normal. Pupils are equal, round, and reactive to light. Right eye exhibits no discharge. Left eye exhibits no discharge.  Neck: Normal range of motion. Neck supple. No JVD present.  Cardiovascular: Normal rate, regular rhythm and normal heart sounds.   Pulmonary/Chest: Effort normal and breath sounds normal. No stridor. No respiratory distress. He has no wheezes. He has no rales. He exhibits no tenderness.  Abdominal: Soft. Bowel sounds are normal. There is no tenderness. There is no guarding.  Genitourinary: Penis normal.  Musculoskeletal: Normal range of motion. He exhibits no edema and no tenderness.  Neurological: He is alert. No cranial nerve deficit. He exhibits normal muscle tone.  Skin: Skin is warm and dry. He is not diaphoretic. No erythema. No pallor.  Psychiatric: He has a normal mood and affect. His behavior is normal. Judgment and thought content normal.    ED Course  Procedures (including critical care time)  Labs Reviewed  POCT I-STAT TROPONIN I - Abnormal; Notable for the following:    Troponin i, poc 0.57 (*)     All other components within normal limits  CBC  BASIC METABOLIC PANEL   Dg Chest Port 1 View  08/29/2012  *RADIOLOGY REPORT*  Clinical Data: Mid chest pain  PORTABLE CHEST - 1 VIEW  Comparison: Portable exam 1536 hours compared to 12/25/2011  Findings: Mild enlargement of cardiac silhouette post CABG. Mediastinal contours and pulmonary vascularity normal. Mild atherosclerotic calcification aortic arch. Lungs grossly clear. No pleural effusion, pneumothorax or acute osseous findings.  IMPRESSION: Minimal enlargement  of cardiac silhouette post CABG. No acute abnormalities.   Original Report Authenticated By: Lollie Marrow, M.D.      No diagnosis found.  Basic metabolic panel (Final result)  Abnormal  Component (Lab Inquiry)      Result Time  NA  K  CL  CO2  GLUCOSE    08/29/12 2122  138  3.3 (L)  103  26  132 (H)           Result Time  BUN  Creatinine, Ser  CALCIUM  GFR calc non Af Amer  GFR calc Af Amer    08/29/12 2122  21  0.88  9.0  89 (L)  >90 The eGFR has been calculated using the CKD EPI equation. This calculation has not been validated in all clinical situations. eGFR's persistently <90 mL/min signify possible Chronic Kidney Disease.         CBC (Final result)  Abnormal  Component (Lab Inquiry)      Result Time  WBC  RBC  HGB  HCT  MCV    08/29/12 2115  10.6 (H)  4.49  13.9  39.5  88.0           Result Time  MCH  MCHC  RDW  PLT    08/29/12 2115  31.0  35.2  14.3  157         Protime-INR (Final result)   Component (Lab Inquiry)      Result Time  Prothrombin Time  INR    08/29/12 2114  13.8  1.07         Basic metabolic panel (Final result)  Abnormal  Component (Lab Inquiry)      Result Time  NA  K  CL  CO2  GLUCOSE    08/29/12 1616  139  3.9  102  23  119 (H)           Result Time  BUN  Creatinine, Ser  CALCIUM  GFR calc non Af Amer  GFR calc Af Amer    08/29/12 1616  21  0.92  9.6  87 (L)  >90 The eGFR has been calculated using the CKD EPI equation. This calculation has not been validated in all clinical situations. eGFR's persistently <90 mL/min signify possible Chronic Kidney Disease.         POCT i-Stat troponin I (Final result)  Abnormal  Component (Lab Inquiry)      Result Time  Troponin i, poc  Comment  Comment 3    08/29/12 1601  0.57 (HH)  NOTIFIED PHYSICIAN   Due to the release kinetics of cTnI, a negative result within the first hours of the onset of symptoms does not rule out myocardial infarction with certainty. If myocardial infarction is still  suspected, repeat the test at appropriate intervals.         CBC (Final result)   Component (Lab Inquiry)      Result Time  WBC  RBC  HGB  HCT  MCV    08/29/12 1553  10.5  4.74  15.0  41.7  88.0           Result Time  MCH  MCHC  RDW  PLT    08/29/12 1553  31.6  36.0  14.3  173          Imaging Results         DG Chest Port 1 View (Final result)   Result time:08/29/12 1554    Final result by Rad Results In Interface (08/29/12 15:54:17)    Narrative:   *RADIOLOGY REPORT*  Clinical Data: Mid chest pain  PORTABLE CHEST - 1 VIEW  Comparison: Portable exam 1536 hours compared to 12/25/2011  Findings: Mild enlargement of cardiac silhouette post CABG. Mediastinal contours and pulmonary vascularity normal. Mild atherosclerotic calcification aortic arch. Lungs grossly clear. No pleural effusion, pneumothorax or acute osseous findings.  IMPRESSION: Minimal enlargement of cardiac silhouette post CABG. No acute abnormalities.   Original Report Authenticated By: Lollie Marrow, M.D.      MDM  4:09 PM hx MI w/ CABG in Jan. Here w/ 1 day CP. On EKG has elevation in aVR w/ subtle ST depression in 2, V2, V4-6. More pronounced on EMS EKGs that pt arrived with. 4/10 pain now. Cards consulted emergently as STEMI. Already got ASA, NTG, morphine.   Decided on anticoag w/ hep and admit to ccu for NSTEMI.        Warrick Parisian, MD 08/29/12 [phone]

## 2012-08-29 NOTE — Progress Notes (Signed)
Responded to ED for code stemi which was later cancelled. Pastoral care was not needed.  Marjory Lies Chaplain

## 2012-08-29 NOTE — Progress Notes (Signed)
CRITICAL VALUE ALERT  Critical value received:  Troponin 4.66  Date of notification:  08/29/2012  Time of notification:  2134  Critical value read back:yes  Nurse who received alert:  M.Martin, RN  MD notified (1st page):  Hinda Glatter, PA  Time of first page:  2144  MD notified (2nd page):  Time of second page:  Responding MD:  Hinda Glatter, PA  Time MD responded:  2146

## 2012-08-29 NOTE — ED Notes (Signed)
Per [facility], pt started having mid cp last night that went away after 3-4 NTG. States it felt like the same pain he had in January when he had his MI and CABG. C/o nausea and SOB today with the cp and took 3-4 more NTG. Vomited x 1 in route to facility. 10 mg Morphine and 4 mg Zofran given IV through 20g Left Hand. O2 at 2 Liters [place]. NSR on the monitor with diffuse depression in leads 1, V3, V4, V5, V6, aVF

## 2012-08-29 NOTE — ED Notes (Signed)
Code STEMI cancelled. 

## 2012-08-29 NOTE — Progress Notes (Signed)
ANTICOAGULATION CONSULT NOTE - Initial Consult  Pharmacy Consult for Heparin Indication: chest pain/ACS  No Known Allergies  Patient Measurements: Height: 5\' 9"  (175.3 cm) Weight: 190 lb (86.183 kg) IBW/kg (Calculated) : 70.7   Vital Signs: Temp: 97.7 F (36.5 C) (10/24 1518) Temp src: Oral (10/24 1518) BP: 130/77 mmHg (10/24 1518) Pulse Rate: 77  (10/24 1518)  Labs:  Basename 08/29/12 1528  HGB 15.0  HCT 41.7  PLT 173  APTT --  LABPROT --  INR --  HEPARINUNFRC --  CREATININE 0.92  CKTOTAL --  CKMB --  TROPONINI --    Estimated Creatinine Clearance: 88.2 ml/min (by C-G formula based on Cr of 0.92).   Medical History: Past Medical History  Diagnosis Date  . Hypertension   . Hypercholesterolemia   . MI (myocardial infarction)   . DDD (degenerative disc disease), cervical    Assessment: 66 year old admitted with CP, code STEMI cancelled. Has a history of MI with CABG in January  CrCl stable, no history of Coumadin   Goal of Therapy:  Heparin level 0.3-0.7 units/ml Monitor platelets by anticoagulation protocol: Yes   Plan:  1) Heparin 4000 units iv bolus x 1 2) Heparin drip at 1300 units / hr 3) Heparin level 6 hours after heparin begins 4) Daily heparin level, CBC  Thank you. Okey Regal, PharmD [phone]  08/29/2012,4:38 PM

## 2012-08-29 NOTE — ED Notes (Signed)
Elevated Istat troponin reported to Dr Silverio Lay.

## 2012-08-29 NOTE — Progress Notes (Signed)
Patient's potassium 3.3, L. Kilroy PA notified and order received.

## 2012-08-29 NOTE — Progress Notes (Signed)
@   1910 patient arrived via hospital bed, accompanied by emergency room RN. Patient appears to be in no respiratory distress. Patient denies chest or any other pain. 2600 RN assumed care.

## 2012-08-29 NOTE — ED Notes (Signed)
Dr Hilty at the bedside 

## 2012-08-30 ENCOUNTER — Encounter (HOSPITAL_COMMUNITY)
Admission: EM | Disposition: A | Discharge status: Home / Self Care | Payer: Self-pay | Source: Home / Self Care | Attending: Internal Medicine

## 2012-08-30 DIAGNOSIS — R079 Chest pain, unspecified: Secondary | ICD-10-CM | POA: Diagnosis not present

## 2012-08-30 DIAGNOSIS — I2510 Atherosclerotic heart disease of native coronary artery without angina pectoris: Secondary | ICD-10-CM | POA: Diagnosis not present

## 2012-08-30 DIAGNOSIS — I10 Essential (primary) hypertension: Secondary | ICD-10-CM | POA: Diagnosis not present

## 2012-08-30 DIAGNOSIS — I214 Non-ST elevation (NSTEMI) myocardial infarction: Secondary | ICD-10-CM | POA: Diagnosis not present

## 2012-08-30 DIAGNOSIS — I25810 Atherosclerosis of coronary artery bypass graft(s) without angina pectoris: Secondary | ICD-10-CM | POA: Diagnosis not present

## 2012-08-30 HISTORY — PX: LEFT HEART CATHETERIZATION WITH CORONARY/GRAFT ANGIOGRAM: SHX5450

## 2012-08-30 HISTORY — PX: LEFT HEART CATHETERIZATION WITH CORONARY ANGIOGRAM: SHX5451

## 2012-08-30 LAB — LIPID PANEL
Cholesterol: 118 mg/dL (ref 0–200)
HDL: 34 mg/dL — ABNORMAL LOW (ref >39)
LDL Cholesterol: 64 mg/dL (ref 0–99)
Triglycerides: 99 mg/dL (ref <150)
VLDL: 20 mg/dL (ref 0–40)

## 2012-08-30 LAB — CBC
HCT: 38.9 % — ABNORMAL LOW (ref 39.0–52.0)
Hemoglobin: 13.2 g/dL (ref 13.0–17.0)
MCH: 29.9 pg (ref 26.0–34.0)
MCHC: 33.9 g/dL (ref 30.0–36.0)

## 2012-08-30 LAB — TROPONIN I: Troponin I: 8.99 ng/mL (ref <0.30)

## 2012-08-30 LAB — HEPARIN LEVEL (UNFRACTIONATED): Heparin Unfractionated: 0.34 IU/mL (ref 0.30–0.70)

## 2012-08-30 SURGERY — LEFT HEART CATHETERIZATION WITH CORONARY ANGIOGRAM
Anesthesia: LOCAL

## 2012-08-30 MED ORDER — CLOPIDOGREL BISULFATE 75 MG PO TABS: 75.0000 mg | ORAL_TABLET | Freq: Every day | ORAL | Status: DC

## 2012-08-30 MED ORDER — CLOPIDOGREL BISULFATE 75 MG PO TABS
75.0000 mg | ORAL_TABLET | Freq: Every day | ORAL | Status: DC
  Administered 2012-08-30: 75 mg via ORAL
  Filled 2012-08-30 (×2): qty 1

## 2012-08-30 MED ORDER — ASPIRIN EC 81 MG PO TBEC
81.0000 mg | DELAYED_RELEASE_TABLET | Freq: Every day | ORAL | Status: DC
  Administered 2012-08-31 – 2012-09-03 (×4): 81 mg via ORAL
  Filled 2012-08-30 (×4): qty 1

## 2012-08-30 MED ORDER — MIDAZOLAM HCL 2 MG/2ML IJ SOLN
INTRAMUSCULAR | Status: AC
  Filled 2012-08-30: qty 2

## 2012-08-30 MED ORDER — FENTANYL CITRATE 0.05 MG/ML IJ SOLN
INTRAMUSCULAR | Status: AC
  Filled 2012-08-30: qty 2

## 2012-08-30 MED ORDER — SODIUM CHLORIDE 0.9 % IV SOLN
INTRAVENOUS | Status: DC
  Administered 2012-08-31: via INTRAVENOUS

## 2012-08-30 MED ORDER — POTASSIUM CHLORIDE CRYS ER 20 MEQ PO TBCR
40.0000 meq | EXTENDED_RELEASE_TABLET | Freq: Once | ORAL | Status: AC
  Administered 2012-08-30: 40 meq via ORAL
  Filled 2012-08-30: qty 2

## 2012-08-30 MED ORDER — ATORVASTATIN CALCIUM 40 MG PO TABS
40.0000 mg | ORAL_TABLET | Freq: Every day | ORAL | Status: DC
  Filled 2012-08-30: qty 1

## 2012-08-30 MED ORDER — NITROGLYCERIN IN D5W 200-5 MCG/ML-% IV SOLN: 2.0000 ug/min | INTRAVENOUS | Status: DC

## 2012-08-30 MED ORDER — TERAZOSIN HCL 5 MG PO CAPS
10.0000 mg | ORAL_CAPSULE | Freq: Every day | ORAL | Status: DC
  Administered 2012-08-30 – 2012-08-31 (×2): 10 mg via ORAL
  Filled 2012-08-30 (×3): qty 2

## 2012-08-30 MED ORDER — ACETAMINOPHEN 325 MG PO TABS: 650.0000 mg | ORAL_TABLET | ORAL | Status: DC | PRN

## 2012-08-30 MED ORDER — ONDANSETRON HCL 4 MG/2ML IJ SOLN: 4.0000 mg | Freq: Four times a day (QID) | INTRAMUSCULAR | Status: DC | PRN

## 2012-08-30 MED ORDER — NITROGLYCERIN 0.2 MG/ML ON CALL CATH LAB
INTRAVENOUS | Status: AC
  Filled 2012-08-30: qty 1

## 2012-08-30 MED ORDER — HEPARIN (PORCINE) IN NACL 100-0.45 UNIT/ML-% IJ SOLN
1300.0000 [IU]/h | INTRAMUSCULAR | Status: DC
  Administered 2012-08-31: 1300 [IU]/h via INTRAVENOUS
  Filled 2012-08-30 (×2): qty 250

## 2012-08-30 MED ORDER — METOPROLOL TARTRATE 25 MG PO TABS
25.0000 mg | ORAL_TABLET | Freq: Two times a day (BID) | ORAL | Status: DC
  Administered 2012-08-30: 23:00:00 25 mg via ORAL
  Filled 2012-08-30 (×3): qty 1

## 2012-08-30 MED ORDER — LIDOCAINE HCL (PF) 1 % IJ SOLN
INTRAMUSCULAR | Status: AC
  Filled 2012-08-30: qty 30

## 2012-08-30 NOTE — ED Provider Notes (Signed)
I have supervised the resident on the management of this patient and agree with the note above. I personally interviewed and examined the patient and my addendum is below.   [name]. is a 66 y.o. male hx of HTN, recent MI s/p CABG in January here with substernal chest pain. He felt the same kind of pain prior to his CABG. EKG showed AVR elevation and ST depression laterally. Trop neg x 1. STEMI activated upon examining the patient. Patient recevied ASA NTG, morphine en route and was started on nitro and heparin drip. Cardiology evaluated patient in the ED and felt that he didn't require emergent cath. He was admitted to the CCU.   CRITICAL CARE Performed by: Silverio Lay, Yomara Toothman   Total critical care time: 30 minutes   Critical care time was exclusive of separately billable procedures and treating other patients.  Critical care was necessary to treat or prevent imminent or life-threatening deterioration.  Critical care was time spent personally by me on the following activities: development of treatment plan with patient and/or surrogate as well as nursing, discussions with consultants, evaluation of patient's response to treatment, examination of patient, obtaining history from patient or surrogate, ordering and performing treatments and interventions, ordering and review of laboratory studies, ordering and review of radiographic studies, pulse oximetry and re-evaluation of patient's condition.     Richardean Canal, MD 08/30/12 [phone]

## 2012-08-30 NOTE — Progress Notes (Signed)
ANTICOAGULATION CONSULT NOTE - Follow Up Consult  Pharmacy Consult for heparin Indication: chest pain/ACS  No Known Allergies  Patient Measurements: Height: 5' 9.5" (176.5 cm) Weight: 188 lb 6.4 oz (85.458 kg) IBW/kg (Calculated) : 71.85  Heparin Dosing Weight: 85.5 kg  Vital Signs: Temp: 97.8 F (36.6 C) (10/25 0000) Temp src: Oral (10/25 0000) BP: 111/74 mmHg (10/25 0000) Pulse Rate: 64  (10/25 0000)  Labs:  Basename 08/30/12 0025 08/29/12 2033 08/29/12 2017 08/29/12 1528  HGB -- 13.9 -- 15.0  HCT -- 39.5 -- 41.7  PLT -- 157 -- 173  APTT -- -- -- --  LABPROT -- 13.8 -- --  INR -- 1.07 -- --  HEPARINUNFRC 0.31 -- -- --  CREATININE -- 0.88 -- 0.92  CKTOTAL -- -- -- --  CKMB -- -- -- --  TROPONINI -- -- 4.66* --    Estimated Creatinine Clearance: 86.2 ml/min (by C-G formula based on Cr of 0.88).   Medications:  Scheduled:    . aspirin EC  325 mg Oral Daily  . atorvastatin  20 mg Oral q1800  . clopidogrel  75 mg Oral Once  . diazepam  5 mg Oral On Call  . finasteride  5 mg Oral Daily  . heparin  4,000 Units Intravenous Once  . hydrochlorothiazide  12.5 mg Oral Daily  . metoprolol tartrate  12.5 mg Oral BID  . pantoprazole  40 mg Oral QAC breakfast  . potassium chloride  40 mEq Oral Once  . terazosin  15 mg Oral QHS  . DISCONTD: hydrochlorothiazide  12.5 mg Oral Daily   Infusions:    . heparin 1,300 Units/hr (08/29/12 1714)  . nitroGLYCERIN 3 mcg/min (08/29/12 2100)    Assessment: 66 yo male with ACS/chest pain is currently on therapeutic heparin.  Heparin level 0.31.   Goal of Therapy:  Heparin level 0.3-0.7 units/ml Monitor platelets by anticoagulation protocol: Yes   Plan:  1) Continue heparin at 1300 units/hr  2) Follow up CBC and heparin level in am.  Makaylen Thieme, Tsz-Yin 08/30/2012,1:02 AM

## 2012-08-30 NOTE — Progress Notes (Signed)
Cardiology PA taking call notified about EKG changes that suggest possible heart block. PA at bedside to assess patient. PT is stable and symptomatic, continues on Nitro gtt and Heparin gtt, denies chest pain. Skin warm and dry.

## 2012-08-30 NOTE — Progress Notes (Signed)
ANTICOAGULATION CONSULT NOTE - Follow Up Consult  Pharmacy Consult for heparin Indication: chest pain/ACS  No Known Allergies  Patient Measurements: Height: 5' 9.5" (176.5 cm) Weight: 188 lb 6.4 oz (85.458 kg) IBW/kg (Calculated) : 71.85  Heparin Dosing Weight: 85.5 kg  Vital Signs: Temp: 98 F (36.7 C) (10/25 0400) Temp src: Oral (10/25 0400) BP: 93/60 mmHg (10/25 0700) Pulse Rate: 59  (10/25 0700)  Labs:  Basename 08/30/12 0447 08/30/12 0025 08/29/12 2033 08/29/12 2017 08/29/12 1528  HGB 13.2 -- 13.9 -- --  HCT 38.9* -- 39.5 -- 41.7  PLT 158 -- 157 -- 173  APTT -- -- -- -- --  LABPROT -- -- 13.8 -- --  INR -- -- 1.07 -- --  HEPARINUNFRC 0.34 0.31 -- -- --  CREATININE -- -- 0.88 -- 0.92  CKTOTAL -- -- -- -- --  CKMB -- -- -- -- --  TROPONINI 9.92* -- -- 4.66* --    Estimated Creatinine Clearance: 86.2 ml/min (by C-G formula based on Cr of 0.88).   Medications:  Scheduled:     . aspirin EC  325 mg Oral Daily  . atorvastatin  20 mg Oral q1800  . clopidogrel  75 mg Oral Once  . diazepam  5 mg Oral On Call  . finasteride  5 mg Oral Daily  . heparin  4,000 Units Intravenous Once  . hydrochlorothiazide  12.5 mg Oral Daily  . metoprolol tartrate  12.5 mg Oral BID  . pantoprazole  40 mg Oral QAC breakfast  . potassium chloride  40 mEq Oral Once  . potassium chloride  40 mEq Oral Once  . terazosin  15 mg Oral QHS  . DISCONTD: hydrochlorothiazide  12.5 mg Oral Daily   Infusions:     . heparin 1,300 Units/hr (08/30/12 0910)  . nitroGLYCERIN 3 mcg/min (08/29/12 2100)    Assessment: 66 yo male with ACS/chest pain is currently on therapeutic heparin.  Heparin level 0.34. Cath today    Goal of Therapy:  Heparin level 0.3-0.7 units/ml Monitor platelets by anticoagulation protocol: Yes   Plan:  1) Continue heparin at 1300 units/hr  2) Follow up CBC and heparin level in am.  Ulyses Southward [place] 08/30/2012,9:26 AM

## 2012-08-30 NOTE — Progress Notes (Signed)
[facility]  Subjective: No CP, SOB, dizziness.  Objective: Vital signs in last 24 hours: Temp:  [97.7 F (36.5 C)-98.4 F (36.9 C)] 98 F (36.7 C) (10/25 0400) Pulse Rate:  [57-84] 59  (10/25 0700) Resp:  [13-20] 16  (10/25 0700) BP: (93-134)/(60-80) 93/60 mmHg (10/25 0700) SpO2:  [99 %-100 %] 100 % (10/25 0700) Weight:  [85 kg (187 lb 6.3 oz)-86.183 kg (190 lb)] 85.458 kg (188 lb 6.4 oz) (10/25 0000) Last BM Date: 08/29/12  Intake/Output from previous day: 10/24 0701 - 10/25 0700 In: 405 [P.O.:240; I.V.:165] Out: 400 [Urine:400] Intake/Output this shift:    Medications Current Facility-Administered Medications  Medication Dose Route Frequency Provider Last Rate Last Dose  . 0.9 %  sodium chloride infusion  250 mL Intravenous PRN Chrystie Nose, MD      . acetaminophen (TYLENOL) tablet 500 mg  500 mg Oral Q6H PRN Abelino Derrick, PA      . acetaminophen (TYLENOL) tablet 650 mg  650 mg Oral Q4H PRN Chrystie Nose, MD      . ALPRAZolam Prudy Feeler) tablet 0.25 mg  0.25 mg Oral TID PRN Abelino Derrick, PA      . aspirin EC tablet 325 mg  325 mg Oral Daily Chrystie Nose, MD      . atorvastatin (LIPITOR) tablet 20 mg  20 mg Oral q1800 Chrystie Nose, MD   20 mg at 08/29/12 2239  . clopidogrel (PLAVIX) tablet 75 mg  75 mg Oral Once Warrick Parisian, MD   75 mg at 08/29/12 1647  . diazepam (VALIUM) tablet 5 mg  5 mg Oral On Call Chrystie Nose, MD      . finasteride (PROSCAR) tablet 5 mg  5 mg Oral Daily Chrystie Nose, MD      . heparin ADULT infusion 100 units/mL (25000 units/250 mL)  1,300 Units/hr Intravenous Continuous Chrystie Nose, MD 13 mL/hr at 08/29/12 1714 1,300 Units/hr at 08/29/12 1714  . heparin bolus via infusion 4,000 Units  4,000 Units Intravenous Once Chrystie Nose, MD   4,000 Units at 08/29/12 1715  . hydrochlorothiazide (MICROZIDE) capsule 12.5 mg  12.5 mg Oral Daily Chrystie Nose, MD      . HYDROcodone-acetaminophen  (NORCO/VICODIN) 5-325 MG per tablet 1 tablet  1 tablet Oral Q6H PRN Chrystie Nose, MD      . hydrOXYzine (ATARAX/VISTARIL) tablet 50 mg  50 mg Oral Q8H PRN Chrystie Nose, MD      . metoprolol tartrate (LOPRESSOR) tablet 12.5 mg  12.5 mg Oral BID Chrystie Nose, MD   12.5 mg at 08/29/12 2238  . nitroGLYCERIN (NITROSTAT) SL tablet 0.4 mg  0.4 mg Sublingual Q5 Min x 3 PRN Chrystie Nose, MD      . nitroGLYCERIN 0.2 mg/mL in dextrose 5 % infusion  3-30 mcg/min Intravenous Titrated Chrystie Nose, MD 0.9 mL/hr at 08/29/12 2100 3 mcg/min at 08/29/12 2100  . ondansetron (ZOFRAN) injection 4 mg  4 mg Intravenous Q6H PRN Chrystie Nose, MD      . ondansetron [facility]) injection 4 mg  4 mg Intravenous Q6H PRN Abelino Derrick, PA      . pantoprazole (PROTONIX) EC tablet 40 mg  40 mg Oral QAC breakfast Chrystie Nose, MD      . potassium chloride SA (K-DUR,KLOR-CON) CR tablet 40 mEq  40 mEq Oral Once [employer], PA   40 mEq at  08/29/12 2250  . sodium chloride 0.9 % injection 3 mL  3 mL Intravenous PRN Chrystie Nose, MD      . terazosin (HYTRIN) capsule 15 mg  15 mg Oral QHS Chrystie Nose, MD   15 mg at 08/29/12 2240  . zolpidem (AMBIEN) tablet 5 mg  5 mg Oral QHS PRN Abelino Derrick, PA      . DISCONTD: hydrochlorothiazide (HYDRODIURIL) tablet 12.5 mg  12.5 mg Oral Daily Chrystie Nose, MD        PE: General appearance: alert, cooperative and no distress Lungs: clear to auscultation bilaterally Heart: regular rate and rhythm and 1/6 sys MM Extremities: No LEE Pulses: 2+ and symmetric Skin: Warm and dry. Neurologic: Grossly normal  Lab Results:   Basename 08/30/12 0447 08/29/12 2033 08/29/12 1528  WBC 10.2 10.6* 10.5  HGB 13.2 13.9 15.0  HCT 38.9* 39.5 41.7  PLT 158 157 173   BMET  Basename 08/29/12 2033 08/29/12 1528  NA 138 139  K 3.3* 3.9  CL 103 102  CO2 26 23  GLUCOSE 132* 119*  BUN 21 21  CREATININE 0.88 0.92  CALCIUM 9.0 9.6   PT/INR  Basename 08/29/12  2033  LABPROT 13.8  INR 1.07   Cholesterol  Basename 08/30/12 0447  CHOL 118   Cardiac Panel (last 3 results)  Basename 08/30/12 0447 08/29/12 2017  CKTOTAL -- --  CKMB -- --  TROPONINI 9.92* 4.66*  RELINDX -- --    Studies/Results:    Assessment/Plan  Principal Problem:  *NSTEMI (non-ST elevated myocardial infarction) Active Problems:  S/P CABG x 3: LIMA TO LAD, SVG TO OM, SVG TO RCA., (emergent)  CAD (coronary artery disease), severe 4 vessell at cath with 99% LMCA  Smoker  HTN (hypertension)  Plan:  NSTEMI.  AM EKG shows ST depressions in V2-6.  May be worse than prior.  AV dissociation.  The pt is pain free.  Trop 9.92.  Left heart cath today around 1330hrs.   LOS: 1 day    HAGER, BRYAN 08/30/2012 7:48 AM   Patient seen and examined. Agree with assessment and plan. No further chest pain since admission. Discussed cath with patient who agrees to proceed. Plan later today.   Lennette Bihari, MD, [facility] 08/30/2012 8:34 AM

## 2012-08-30 NOTE — Progress Notes (Signed)
ANTICOAGULATION CONSULT NOTE - Follow Up Consult  Pharmacy Consult for Heparin Indication: 8 hrs post sheath removal while awaiting surgical plans  No Known Allergies  Patient Measurements: Height: 5' 9.5" (176.5 cm) Weight: 188 lb 6.4 oz (85.458 kg) IBW/kg (Calculated) : 71.85  Heparin Dosing Weight: 85.5 kg  Vital Signs: Temp: 97.9 F (36.6 C) (10/25 1901) Temp src: Oral (10/25 1901) BP: 143/65 mmHg (10/25 1901) Pulse Rate: 66  (10/25 1901)  Labs:  Basename 08/30/12 0841 08/30/12 0447 08/30/12 0025 08/29/12 2033 08/29/12 2017 08/29/12 1528  HGB -- 13.2 -- 13.9 -- --  HCT -- 38.9* -- 39.5 -- 41.7  PLT -- 158 -- 157 -- 173  APTT -- -- -- -- -- --  LABPROT -- -- -- 13.8 -- --  INR -- -- -- 1.07 -- --  HEPARINUNFRC -- 0.34 0.31 -- -- --  CREATININE -- -- -- 0.88 -- 0.92  CKTOTAL -- -- -- -- -- --  CKMB -- -- -- -- -- --  TROPONINI 8.99* 9.92* -- -- 4.66* --    Estimated Creatinine Clearance: 86.2 ml/min (by C-G formula based on Cr of 0.88).   Assessment: 66 y.o. M who underwent cath earlier today and was found to have need for interventions however none performed today. It is unclear of the findings as no post-cath notes have been written however per discussion with the nurse they are further evaluation for possible surgical intervention. Pharmacy was consulted to resume heparin 8 hours post sheath removal. The sheath was noted to be removed at ~1830. Hgb/Hct/Plt stable -- no current bleeding noted.  Goal of Therapy:  Heparin level 0.3-0.7 units/ml Monitor platelets by anticoagulation protocol: Yes   Plan:  1. Resume heparin at rate of 1300 units/hr (13 ml/hr) starting at ~0230 on 10/26 2. Daily heparin levels, CBC 3. Will continue to monitor for any signs/symptoms of bleeding and will follow up with heparin level in 6 hours   Georgina Pillion, PharmD, BCPS Clinical Pharmacist Pager: [phone] 08/30/2012 7:59 PM

## 2012-08-31 DIAGNOSIS — I214 Non-ST elevation (NSTEMI) myocardial infarction: Secondary | ICD-10-CM | POA: Diagnosis not present

## 2012-08-31 DIAGNOSIS — I10 Essential (primary) hypertension: Secondary | ICD-10-CM | POA: Diagnosis not present

## 2012-08-31 DIAGNOSIS — I25810 Atherosclerosis of coronary artery bypass graft(s) without angina pectoris: Secondary | ICD-10-CM | POA: Diagnosis not present

## 2012-08-31 DIAGNOSIS — I2510 Atherosclerotic heart disease of native coronary artery without angina pectoris: Secondary | ICD-10-CM | POA: Diagnosis not present

## 2012-08-31 LAB — BASIC METABOLIC PANEL
Calcium: 8 mg/dL — ABNORMAL LOW (ref 8.4–10.5)
Creatinine, Ser: 0.79 mg/dL (ref 0.50–1.35)
GFR calc Af Amer: >90 mL/min (ref >90)

## 2012-08-31 LAB — TROPONIN I: Troponin I: 3.72 ng/mL (ref <0.30)

## 2012-08-31 LAB — CBC
HCT: 37 % — ABNORMAL LOW (ref 39.0–52.0)
MCHC: 33.2 g/dL (ref 30.0–36.0)
MCV: 88.7 fL (ref 78.0–100.0)
RDW: 14.6 % (ref 11.5–15.5)

## 2012-08-31 LAB — HEPARIN LEVEL (UNFRACTIONATED): Heparin Unfractionated: 0.22 IU/mL — ABNORMAL LOW (ref 0.30–0.70)

## 2012-08-31 MED ORDER — CLOPIDOGREL BISULFATE 75 MG PO TABS
75.0000 mg | ORAL_TABLET | Freq: Every day | ORAL | Status: DC
  Administered 2012-08-31 – 2012-09-02 (×3): 75 mg via ORAL
  Filled 2012-08-31 (×3): qty 1

## 2012-08-31 MED ORDER — AMLODIPINE BESYLATE 2.5 MG PO TABS
2.5000 mg | ORAL_TABLET | Freq: Every day | ORAL | Status: DC
  Administered 2012-08-31: 2.5 mg via ORAL
  Filled 2012-08-31 (×3): qty 1

## 2012-08-31 MED ORDER — ATORVASTATIN CALCIUM 80 MG PO TABS
80.0000 mg | ORAL_TABLET | Freq: Every day | ORAL | Status: DC
  Administered 2012-08-31 – 2012-09-02 (×3): 80 mg via ORAL
  Filled 2012-08-31 (×4): qty 1

## 2012-08-31 MED ORDER — METOPROLOL TARTRATE 25 MG PO TABS
37.5000 mg | ORAL_TABLET | Freq: Two times a day (BID) | ORAL | Status: DC
  Administered 2012-08-31: 37.5 mg via ORAL
  Filled 2012-08-31 (×4): qty 1

## 2012-08-31 NOTE — Cardiovascular Report (Signed)
[name], [name] NO.:  [id number]  MEDICAL RECORD NO.:  [id number]  LOCATION:  2009                         FACILITY:  [facility]  PHYSICIAN:  Nicki Guadalajara, M.D.     DATE OF BIRTH:  [birth year]  DATE OF PROCEDURE:  08/30/2012 DATE OF DISCHARGE:                           CARDIAC CATHETERIZATION   PROCEDURE:  Cardiac catheterization.  INDICATIONS:  [name] is a 66 year old gentleman who suffered an acute anterior wall ST-segment elevation myocardial infarction in January 2013.  He was taken acutely to the catheterization laboratory by Dr. Allyson Sabal and was found to have 99% ostial left main stenosis, 80% mid LAD stenosis, and moderate RCA stenoses.  An intra-aortic balloon pump was inserted for ongoing chest pain and he was taken emergently to the operating room by Dr. Laneta Simmers where he underwent a LIMA graft to the LAD, vein graft to the obtuse marginal vessel, and a vein graft to the RCA. The patient had done well initially.  He apparently presented to the emergency room yesterday with 2 days of chest pain.  Initially, a code STEMI was called, but it was not felt that he was having a STEMI, but he did have anterolateral ST depression.  The patient was treated with heparin.  He now presents for catheterization.  PROCEDURE:  After premedication with Versed 2 mg plus fentanyl 50 mcg, the patient was prepped and draped in usual fashion.  His right femoral artery was punctured anteriorly and a 5-French sheath was inserted without difficulty.  Diagnostic catheterization was done utilizing 5- [place] Judkins 4 left and right coronary catheters, a LIMA graft catheter as well as a left bypass graft catheter.  A 5-French pigtail catheter was used for RAO ventriculography.  Supravalvular aortography was also performed in an attempt to verify occlusion of the vein grafts, which had supplied the circumflex vessel.  The patient tolerated the procedure well.  Hemostasis was  obtained by direct manual pressure.  He returned to his room in satisfactory condition.  HEMODYNAMIC DATA:  Central aortic pressure was 125/63.  Left ventricular pressure 125/60.  ANGIOGRAPHIC DATA:  Left main coronary artery was totally occluded at its origin.  The right coronary artery was moderate-sized vessel.  A flush and fill phenomena was seen in the branch and arising from the acute margin.  The distal RCA ended in a PLA-like vessel branch.  The left internal mammary artery vessel was tortuous and free of significant disease.  This anastomosed into the mid LAD just proximal to bifurcating into a diagonal vessel.  The LAD was then a twin like vessel, which did not reach the apex.  Between the LIMA insertion and the proximal septal perforating artery, there was at least 80% LAD stenosis.  There was then filling of the LAD back into the left main where the left main was totally occluded.  The entire circumflex vessel filled via this left injection and appeared to be without significant obstruction.  The circumflex vessel gave rise to a bifurcating marginal vessel.  The vein graft which had supplied the circumflex vessel was occluded at its origin.  The vein graft which had supplied the distal RCA seem to fill  into this branch arising from the acute margin, which was small caliber and gave rise to several additional small branches and was free of significant disease.  RAO ventriculography revealed an ejection fraction of 50% to 55%.  There was mild mid inferior and upper anterolateral hypocontractility.  Aortic root injection did not demonstrate any aortic insufficiency. Only 1 vein graft was visualized, which was the vein graft supplying the right coronary artery.  There was no graft visualized, which supplied the circumflex system verifying that this graft was occluded.  IMPRESSION: 1. Severe native coronary artery disease with total occlusion of the     left main at  its origin and evidence for mild irregularity of the     native right coronary artery with narrowings of 30% to 40%     throughout and evidence for flush and fill phenomena seen in a     branch arising from the acute margin. 2. Patent left internal mammary artery graft, which supplies the mid     LAD, but there is evidence for 80% stenosis in the LAD proximal to     the insertion between the LIMA insertion and the septal with     evidence for filling of the entire left coronary circulation     including the entire left circumflex system through this, LIMA     graft. 3. Occluded vein graft, which had supplied the left circumflex vessel. 4. Patent vein graft supplying the right coronary artery. 5. Low-normal left ventricular dysfunction with mild mid inferior and     upper anterolateral hypocontractility with ejection fraction of 50%     to 55%.  RECOMMENDATION:  Increase medical therapy.  It appears that the vein graft, which had supplied the circumflex vessel is now occluded most likely due to the filling of the circumflex via the LIMA graft, which supplied the entire LAD into the left main and down the circumflex. However, there is some potential for ischemia since the 80% stenosis in the LAD is proximal to the LIMA insertion.  The LIMA itself is very tortuous and the left main is occluded at its origin.  We will restart anti-platelet therapy and aggressively increase medical therapy.          ______________________________ Nicki Guadalajara, M.D.     TK/MEDQ  D:  08/30/2012  T:  08/31/2012  Job:  [id number]  cc:   Nanetta Batty, M.D.

## 2012-08-31 NOTE — Progress Notes (Signed)
[facility]  Subjective: No complaints.    Objective: Vital signs in last 24 hours: Temp:  [97.9 F (36.6 C)-98.9 F (37.2 C)] 98 F (36.7 C) (10/26 0700) Pulse Rate:  [60-77] 61  (10/26 0700) Resp:  [10-19] 19  (10/26 0700) BP: (94-167)/(49-111) 119/49 mmHg (10/26 0700) SpO2:  [99 %-100 %] 100 % (10/26 0700) Last BM Date: 08/30/12  Intake/Output from previous day: 10/25 0701 - 10/26 0700 In: 2042.8 [P.O.:300; I.V.:1742.8] Out: 1150 [Urine:1150] Intake/Output this shift: Total I/O In: 250 [I.V.:250] Out: 300 [Urine:300]  Medications Current Facility-Administered Medications  Medication Dose Route Frequency Provider Last Rate Last Dose  . 0.9 %  sodium chloride infusion   Intravenous Continuous Lennette Bihari, MD 125 mL/hr at 08/31/12 0000    . acetaminophen (TYLENOL) tablet 650 mg  650 mg Oral Q4H PRN Lennette Bihari, MD      . ALPRAZolam Prudy Feeler) tablet 0.25 mg  0.25 mg Oral TID PRN Abelino Derrick, PA      . aspirin EC tablet 81 mg  81 mg Oral Daily Lennette Bihari, MD      . atorvastatin (LIPITOR) tablet 40 mg  40 mg Oral QHS Lennette Bihari, MD      . clopidogrel (PLAVIX) tablet 75 mg  75 mg Oral Q breakfast Lennette Bihari, MD   75 mg at 08/30/12 2231  . diazepam (VALIUM) tablet 5 mg  5 mg Oral On Call Chrystie Nose, MD   5 mg at 08/30/12 1615  . fentaNYL (SUBLIMAZE) 0.05 MG/ML injection           . finasteride (PROSCAR) tablet 5 mg  5 mg Oral Daily Chrystie Nose, MD   5 mg at 08/30/12 1023  . heparin ADULT infusion 100 units/mL (25000 units/250 mL)  1,300 Units/hr Intravenous Continuous Ann Held, PHARMD 13 mL/hr at 08/31/12 0600 1,300 Units/hr at 08/31/12 0600  . hydrochlorothiazide (MICROZIDE) capsule 12.5 mg  12.5 mg Oral Daily Chrystie Nose, MD      . HYDROcodone-acetaminophen (NORCO/VICODIN) 5-325 MG per tablet 1 tablet  1 tablet Oral Q6H PRN Chrystie Nose, MD      . hydrOXYzine (ATARAX/VISTARIL) tablet 50 mg  50 mg  Oral Q8H PRN Chrystie Nose, MD      . lidocaine (XYLOCAINE) 1 % injection           . metoprolol tartrate (LOPRESSOR) tablet 25 mg  25 mg Oral BID Lennette Bihari, MD   25 mg at 08/30/12 2232  . midazolam (VERSED) 2 MG/2ML injection           . nitroGLYCERIN (NITROSTAT) SL tablet 0.4 mg  0.4 mg Sublingual Q5 Min x 3 PRN Chrystie Nose, MD      . nitroGLYCERIN (NTG ON-CALL) 0.2 mg/mL injection           . nitroGLYCERIN 0.2 mg/mL in dextrose 5 % infusion  2-200 mcg/min Intravenous Continuous Lennette Bihari, MD   20 mcg/min at 08/30/12 2100  . ondansetron (ZOFRAN) injection 4 mg  4 mg Intravenous Q6H PRN Lennette Bihari, MD      . pantoprazole (PROTONIX) EC tablet 40 mg  40 mg Oral QAC breakfast Chrystie Nose, MD   40 mg at 08/31/12 0805  . potassium chloride SA (K-DUR,KLOR-CON) CR tablet 40 mEq  40 mEq Oral Once Wilburt Finlay, PA   40 mEq at 08/30/12 0907  . terazosin (HYTRIN) capsule 10  mg  10 mg Oral QHS Lennette Bihari, MD   10 mg at 08/30/12 2231  . zolpidem (AMBIEN) tablet 5 mg  5 mg Oral QHS PRN Abelino Derrick, PA      . DISCONTD: 0.9 %  sodium chloride infusion  250 mL Intravenous PRN Chrystie Nose, MD      . DISCONTD: acetaminophen (TYLENOL) tablet 500 mg  500 mg Oral Q6H PRN Abelino Derrick, PA      . DISCONTD: acetaminophen (TYLENOL) tablet 650 mg  650 mg Oral Q4H PRN Chrystie Nose, MD      . DISCONTD: aspirin EC tablet 325 mg  325 mg Oral Daily Chrystie Nose, MD   325 mg at 08/30/12 1023  . DISCONTD: atorvastatin (LIPITOR) tablet 20 mg  20 mg Oral q1800 Chrystie Nose, MD   20 mg at 08/29/12 2239  . DISCONTD: clopidogrel (PLAVIX) tablet 75 mg  75 mg Oral Q breakfast Lennette Bihari, MD      . DISCONTD: heparin ADULT infusion 100 units/mL (25000 units/250 mL)  1,300 Units/hr Intravenous Continuous Chrystie Nose, MD   1,300 Units/hr at 08/30/12 0910  . DISCONTD: metoprolol tartrate (LOPRESSOR) tablet 12.5 mg  12.5 mg Oral BID Chrystie Nose, MD   12.5 mg at 08/29/12 2238  .  DISCONTD: nitroGLYCERIN 0.2 mg/mL in dextrose 5 % infusion  3-30 mcg/min Intravenous Titrated Chrystie Nose, MD   3 mcg/min at 08/29/12 2100  . DISCONTD: ondansetron (ZOFRAN) injection 4 mg  4 mg Intravenous Q6H PRN Chrystie Nose, MD      . DISCONTD: ondansetron (ZOFRAN) injection 4 mg  4 mg Intravenous Q6H PRN Abelino Derrick, PA      . DISCONTD: sodium chloride 0.9 % injection 3 mL  3 mL Intravenous PRN Chrystie Nose, MD      . DISCONTD: terazosin (HYTRIN) capsule 15 mg  15 mg Oral QHS Chrystie Nose, MD   15 mg at 08/29/12 2240    PE: General appearance: alert, cooperative and no distress Lungs: clear to auscultation bilaterally Heart: regular rate and rhythm, S1, S2 normal, no murmur, click, rub or gallop Extremities: No LEE Pulses: 2+ and symmetric Neurologic: Grossly normal Skin:  Right groin-no hematoma, ecchymosis, tenderness or bruit.   Lab Results:   Basename 08/31/12 0500 08/30/12 0447 08/29/12 2033  WBC 6.8 10.2 10.6*  HGB 12.3* 13.2 13.9  HCT 37.0* 38.9* 39.5  PLT 151 158 157   BMET  Basename 08/29/12 2033 08/29/12 1528  NA 138 139  K 3.3* 3.9  CL 103 102  CO2 26 23  GLUCOSE 132* 119*  BUN 21 21  CREATININE 0.88 0.92  CALCIUM 9.0 9.6   PT/INR  Basename 08/29/12 2033  LABPROT 13.8  INR 1.07   Cholesterol  Basename 08/30/12 0447  CHOL 118   Lipid Panel     Component Value Date/Time   CHOL 118 08/30/2012 0447   TRIG 99 08/30/2012 0447   HDL 34* 08/30/2012 0447   CHOLHDL 3.5 08/30/2012 0447   VLDL 20 08/30/2012 0447   LDLCALC 64 08/30/2012 0447   Cardiac Panel (last 3 results)  Basename 08/30/12 0841 08/30/12 0447 08/29/12 2017  CKTOTAL -- -- --  CKMB -- -- --  TROPONINI 8.99* 9.92* 4.66*  RELINDX -- -- --    Assessment/Plan   Principal Problem:  *NSTEMI (non-ST elevated myocardial infarction) Active Problems:  S/P CABG x 3: LIMA TO LAD, SVG TO OM,  SVG TO RCA., (emergent)  CAD (coronary artery disease), severe 4 vessell at cath  with 99% LMCA  Smoker  HTN (hypertension)  Plan:   S/P left heart cath.  Lab drawing BMET and Troponin.  BP and HR stable.   Get up ambulating.  DC IV nitro and heparin.  On ASA, plavix, lopressor, lipitor, HCTZ   LOS: 2 days    HAGER, BRYAN 08/31/2012 8:37 AM   Patient seen and examined. Agree with assessment and plan. No further chest pain, but cp was similar but less intense c/w 11/2011 presentation. May be due to graft occlusion of LCX. With patent LIMA supplying LAD and LCX, but 80-()% LAD stenosis proximal to LIMA insertion, this is jeopardizing LCX perfusion. LM is occluded. Will try to maximize medical therapy. If continue to have symptoms ? Consideration for new graft to LCX.   Lennette Bihari, MD, [facility] 08/31/2012 9:35 AM

## 2012-09-01 DIAGNOSIS — I2510 Atherosclerotic heart disease of native coronary artery without angina pectoris: Secondary | ICD-10-CM | POA: Diagnosis not present

## 2012-09-01 DIAGNOSIS — I10 Essential (primary) hypertension: Secondary | ICD-10-CM | POA: Diagnosis not present

## 2012-09-01 DIAGNOSIS — I25810 Atherosclerosis of coronary artery bypass graft(s) without angina pectoris: Secondary | ICD-10-CM | POA: Diagnosis not present

## 2012-09-01 DIAGNOSIS — I214 Non-ST elevation (NSTEMI) myocardial infarction: Secondary | ICD-10-CM | POA: Diagnosis not present

## 2012-09-01 LAB — CK: Total CK: 112 U/L (ref 7–232)

## 2012-09-01 LAB — CBC
Hemoglobin: 13.1 g/dL (ref 13.0–17.0)
MCH: 30.1 pg (ref 26.0–34.0)
MCHC: 33.9 g/dL (ref 30.0–36.0)
MCV: 88.7 fL (ref 78.0–100.0)

## 2012-09-01 MED ORDER — METOPROLOL TARTRATE 25 MG PO TABS
37.5000 mg | ORAL_TABLET | Freq: Two times a day (BID) | ORAL | Status: DC
  Administered 2012-09-01 (×2): 37.5 mg via ORAL
  Filled 2012-09-01 (×4): qty 1

## 2012-09-01 MED ORDER — ISOSORBIDE MONONITRATE ER 30 MG PO TB24
30.0000 mg | ORAL_TABLET | Freq: Every day | ORAL | Status: DC
  Administered 2012-09-01: 30 mg via ORAL
  Filled 2012-09-01 (×2): qty 1

## 2012-09-01 MED ORDER — TERAZOSIN HCL 5 MG PO CAPS
5.0000 mg | ORAL_CAPSULE | Freq: Every day | ORAL | Status: DC
  Administered 2012-09-01 – 2012-09-02 (×2): 5 mg via ORAL
  Filled 2012-09-01 (×3): qty 1

## 2012-09-01 MED ORDER — AMLODIPINE BESYLATE 5 MG PO TABS
5.0000 mg | ORAL_TABLET | Freq: Every day | ORAL | Status: DC
  Administered 2012-09-01 – 2012-09-03 (×3): 5 mg via ORAL
  Filled 2012-09-01 (×3): qty 1

## 2012-09-01 NOTE — Progress Notes (Signed)
Pt's wife and daughter stated that pt went back to smoking cigarettes shortly after his CABG in January 2013 and has been smoking everyday since.

## 2012-09-01 NOTE — Progress Notes (Signed)
Pt ambulated in hallway 350 ft and tolerated activity well while ambulating. Shortly after returning to room pt started having "deep chest pressure", that was constant in nature. VS taken, see doc flow sheets. SL nitro given, see MAR. MD notified and aware, New orders given and activated.  Pt resting in bed, currently free of chest pressure. Will continue to monitor.

## 2012-09-01 NOTE — CV Procedure (Signed)
Cardiac Catheterization  [name], 66 y.o., male  Full note dictated 08/30/12; see diagram in chart.  DICTATION # [id number], [id number]  LM: occluded LIMA to LAD: patent supplies LAD and retrograde to LCX with 80% prox LAD stenosis prior to LIMA insertion jeopardizing LCX. RCA: scattered native disease with patent SVG to RCA SVG to OM: occluded  EF 50 - 55%  Nicki Guadalajara A, MD, [facility] 09/01/2012 8:41 AM

## 2012-09-01 NOTE — Cardiovascular Report (Signed)
NAMEANAN, DAPOLITO NO.:  [id number]  MEDICAL RECORD NO.:  [id number]  LOCATION:  2009                         FACILITY:  [facility]  PHYSICIAN:  Nicki Guadalajara, M.D.     DATE OF BIRTH:  1946/06/30  DATE OF PROCEDURE:  08/29/2012 DATE OF DISCHARGE:                           CARDIAC CATHETERIZATION   PROCEDURE:  Cine coronary angiography; selective angiography into vein graft; selective angiography into left internal mammary artery; cine left ventriculography as well as aortic root, supravalvular aortography.  INDICATIONS:  [name] is a 66 year old gentleman, who suffered an acute anterior wall ST-segment elevation myocardial infarction in January 2013.  At that time, acute catheterization showed 99% ostial left main stenosis, LAD stenosis and moderate RCA stenosis.  He had a balloon pump inserted and underwent emergency revascularization surgery that day on December 02, 2011 by Dr. Laneta Simmers with a LIMA to the LAD, a vein to the circumflex marginal, and a vein to the right coronary artery.  The patient had been doing well and recently had seen Dr. Allyson Sabal in the office.  He was admitted to [facility] on August 29, 2012 after experiencing 2 days of chest pain.  The day of admission, chest pain became severe.  He did have 1 mm ST-elevation in aVR and anterolateral ST-segment depression of less than 2 mm.  He subsequently has ruled in for non-ST-segment elevation myocardial infarction.  He now presents for catheterization.  DESCRIPTION OF PROCEDURE:  After premedication with Versed 2 mg plus fentanyl 50 mcg, the patient was prepped and draped in usual fashion. His right femoral artery was punctured anteriorly and a 5-French sheath was inserted without difficulty.  Diagnostic catheterization was done utilizing 5-[place] Judkins 4 left and right coronary catheters, as well as left internal mammary artery catheter.  Pigtail catheter was used for left  ventriculography as well as aortic root injection to verify.          ______________________________ Nicki Guadalajara, M.D.     TK/MEDQ  D:  09/01/2012  T:  09/01/2012  Job:  [id number]  cc:   Nanetta Batty, M.D.

## 2012-09-01 NOTE — Progress Notes (Signed)
[facility] Progress Note  Subjective:  No further pain  Objective:   Vital Signs in the last 24 hours: Temp:  [97.4 F (36.3 C)-98.5 F (36.9 C)] 97.8 F (36.6 C) (10/27 0438) Pulse Rate:  [49-78] 71  (10/27 0438) Resp:  [16-18] 18  (10/27 0438) BP: (107-131)/(66-77) 131/77 mmHg (10/27 0438) SpO2:  [98 %-100 %] 98 % (10/27 0438) Weight:  [84.596 kg (186 lb 8 oz)] 84.596 kg (186 lb 8 oz) (10/27 0438)  Intake/Output from previous day: 10/26 0701 - 10/27 0700 In: 743 [P.O.:480; I.V.:263] Out: 300 [Urine:300]  Scheduled:   . amLODipine  2.5 mg Oral Daily  . aspirin EC  81 mg Oral Daily  . atorvastatin  80 mg Oral QHS  . clopidogrel  75 mg Oral Q breakfast  . finasteride  5 mg Oral Daily  . hydrochlorothiazide  12.5 mg Oral Daily  . metoprolol tartrate  37.5 mg Oral BID  . pantoprazole  40 mg Oral QAC breakfast  . terazosin  10 mg Oral QHS  . DISCONTD: atorvastatin  40 mg Oral QHS  . DISCONTD: clopidogrel  75 mg Oral Q breakfast  . DISCONTD: metoprolol tartrate  25 mg Oral BID    Physical Exam:   General appearance: alert, cooperative and no distress Neck: no adenopathy, no carotid bruit, no JVD and supple, symmetrical, trachea midline Lungs: no wheexing or rales Heart: S1, S2 normal and 1/6 sem Abdomen: soft, non-tender; bowel sounds normal; no masses,  no organomegaly Extremities: no edema, redness or tenderness in the calves or thighs   Rate: 60  Rhythm: normal sinus rhythm  Lab Results:    Basename 08/31/12 0840 08/29/12 2033  NA 137 138  K 3.8 3.3*  CL 109 103  CO2 21 26  GLUCOSE 142* 132*  BUN 13 21  CREATININE 0.79 0.88   Cardiac Panel (last 3 results)  Basename 08/31/12 0830 08/30/12 0841 08/30/12 0447  CKTOTAL -- -- --  CKMB -- -- --  TROPONINI 3.72* 8.99* 9.92*  RELINDX -- -- --    Basename 08/31/12 0830 08/30/12 0841  TROPONINI 3.72* 8.99*   Hepatic Function Panel No results found for this basename:  PROT,ALBUMIN,AST,ALT,ALKPHOS,BILITOT,BILIDIR,IBILI in the last 72 hours  Basename 08/29/12 2033  INR 1.07    Lipid Panel     Component Value Date/Time   CHOL 118 08/30/2012 0447   TRIG 99 08/30/2012 0447   HDL 34* 08/30/2012 0447   CHOLHDL 3.5 08/30/2012 0447   VLDL 20 08/30/2012 0447   LDLCALC 64 08/30/2012 0447     Imaging:  No results found.    Assessment/Plan:   Principal Problem:  *NSTEMI (non-ST elevated myocardial infarction) Active Problems:  S/P CABG x 3: LIMA TO LAD, SVG TO OM, SVG TO RCA., (emergent)  CAD (coronary artery disease), severe 4 vessell at cath with 99% LMCA  Smoker  HTN (hypertension)   Will increase ambulation today. Day 3 s/p NSTEMI. Will increase amlodipine and cardiac meds and further reduce terazocin to allow for cardiac med titration. Possible home tomorrow. If issues with recurrent pain, may need surgical re-evaluation.  Lennette Bihari, MD, [facility] 09/01/2012, 8:44 AM

## 2012-09-02 ENCOUNTER — Other Ambulatory Visit: Payer: Self-pay | Admitting: *Deleted

## 2012-09-02 DIAGNOSIS — I2510 Atherosclerotic heart disease of native coronary artery without angina pectoris: Secondary | ICD-10-CM | POA: Diagnosis not present

## 2012-09-02 DIAGNOSIS — I214 Non-ST elevation (NSTEMI) myocardial infarction: Secondary | ICD-10-CM | POA: Diagnosis not present

## 2012-09-02 LAB — HEPATIC FUNCTION PANEL
ALT: 23 U/L (ref 0–53)
AST: 29 U/L (ref 0–37)
Albumin: 3.2 g/dL — ABNORMAL LOW (ref 3.5–5.2)
Bilirubin, Direct: 0.1 mg/dL (ref 0.0–0.3)

## 2012-09-02 LAB — TROPONIN I: Troponin I: 1.21 ng/mL (ref <0.30)

## 2012-09-02 MED ORDER — RANOLAZINE ER 500 MG PO TB12
500.0000 mg | ORAL_TABLET | Freq: Two times a day (BID) | ORAL | Status: DC
  Administered 2012-09-02 – 2012-09-03 (×3): 500 mg via ORAL
  Filled 2012-09-02 (×4): qty 1

## 2012-09-02 MED ORDER — ISOSORBIDE MONONITRATE ER 60 MG PO TB24
60.0000 mg | ORAL_TABLET | Freq: Every day | ORAL | Status: DC
  Administered 2012-09-02 – 2012-09-03 (×2): 60 mg via ORAL
  Filled 2012-09-02 (×2): qty 1

## 2012-09-02 MED ORDER — TICAGRELOR 90 MG PO TABS
180.0000 mg | ORAL_TABLET | Freq: Once | ORAL | Status: AC
  Administered 2012-09-02: 180 mg via ORAL
  Filled 2012-09-02: qty 2

## 2012-09-02 MED ORDER — TICAGRELOR 90 MG PO TABS
90.0000 mg | ORAL_TABLET | Freq: Two times a day (BID) | ORAL | Status: DC
  Administered 2012-09-03: 90 mg via ORAL
  Filled 2012-09-02 (×2): qty 1

## 2012-09-02 MED ORDER — METOPROLOL TARTRATE 50 MG PO TABS
50.0000 mg | ORAL_TABLET | Freq: Two times a day (BID) | ORAL | Status: DC
  Administered 2012-09-02 – 2012-09-03 (×3): 50 mg via ORAL
  Filled 2012-09-02 (×4): qty 1

## 2012-09-02 NOTE — Progress Notes (Signed)
Gave pt his loading dose of Brilinta this morning along with extensive education packet about medication. Pt verbalized his understanding.

## 2012-09-02 NOTE — Progress Notes (Signed)
CARDIAC REHAB PHASE I   PRE:  Rate/Rhythm: 65SR  BP:  Supine:   Sitting: 80/50  Standing:    SaO2: 99%RA  MODE:  Ambulation: 1520 ft   POST:  Rate/Rhythem: 65  BP:  Supine:   Sitting: 120/60  Standing:    SaO2: 100%RA 1235-1315 Wrote consult for [place] to see pt per MI protocol. Pt walked 1520 ft with steady gait without CP. Pt stated he could have walked even farther. Tolerated well. No CP or dizziness. Pt said he just got [facility] approval to cover CRP 2 so I will refer again to [facility]. Pt will consider program after he has adjusted to meds he said. Discussed MI restrictions.  Duanne Limerick

## 2012-09-02 NOTE — Progress Notes (Addendum)
Subjective: Chest pressure last pm.  NTG with relief, though pt stated NTG was given after pressure resolved on its own.  He also stated it was very mild chest pressure, EKG at the time without acute changes.  Objective: Vital signs in last 24 hours: Temp:  [97.8 F (36.6 C)-98.5 F (36.9 C)] 97.8 F (36.6 C) (10/28 0522) Pulse Rate:  [57-110] 73  (10/28 0522) Resp:  [18-20] 18  (10/28 0522) BP: (103-137)/(62-79) 112/75 mmHg (10/28 0522) SpO2:  [96 %-100 %] 96 % (10/28 0522) Weight:  [83.598 kg (184 lb 4.8 oz)] 83.598 kg (184 lb 4.8 oz) (10/28 0522) Weight change: -0.998 kg (-2 lb 3.2 oz) Last BM Date: 08/30/12 Intake/Output from previous day: +480 10/27 0701 - 10/28 0700 In: 480 [P.O.:480] Out: -  Intake/Output this shift:    PE: General:alert and oriented, wanting to go home Heart:S1S2 RRR no arrhythmias, no murmur  Lungs:clear without rales or rhonchi, ant  Abd:+ BS, soft non tender Ext:no edema Neuro:alert and oritented   Lab Results:  Basename 09/01/12 0912 08/31/12 0500  WBC 8.1 6.8  HGB 13.1 12.3*  HCT 38.6* 37.0*  PLT 152 151   BMET  Basename 08/31/12 0840  NA 137  K 3.8  CL 109  CO2 21  GLUCOSE 142*  BUN 13  CREATININE 0.79  CALCIUM 8.0*    Basename 08/31/12 0830 08/30/12 0841  TROPONINI 3.72* 8.99*    Lab Results  Component Value Date   CHOL 118 08/30/2012   HDL 34* 08/30/2012   LDLCALC 64 08/30/2012   TRIG 99 08/30/2012   CHOLHDL 3.5 08/30/2012   No results found for this basename: HGBA1C     No results found for this basename: TSH    Hepatic Function Panel No results found for this basename: PROT,ALBUMIN,AST,ALT,ALKPHOS,BILITOT,BILIDIR,IBILI in the last 72 hours No results found for this basename: CHOL in the last 72 hours No results found for this basename: PROTIME in the last 72 hours    EKG: Orders placed during the hospital encounter of 08/29/12  . ED EKG  . ED EKG  . EKG 12-LEAD  . EKG 12-LEAD  . EKG 12-LEAD  . EKG  12-LEAD  . EKG 12-LEAD  . EKG 12-LEAD  . EKG 12-LEAD  . EKG 12-LEAD  . EKG 12-LEAD  . EKG 12-LEAD  . EKG 12-LEAD  . EKG 12-LEAD  . EKG 12-LEAD    Studies/Results: LM: occluded  LIMA to LAD: patent supplies LAD and retrograde to LCX with 80% prox LAD stenosis prior to LIMA insertion jeopardizing LCX.  RCA: scattered native disease with patent SVG to RCA  SVG to OM: occluded  EF 50 - 55%  Increase medical therapy. It appears that the vein  graft, which had supplied the circumflex vessel is now occluded most  likely due to the filling of the circumflex via the LIMA graft, which  supplied the entire LAD into the left main and down the circumflex.  However, there is some potential for ischemia since the 80% stenosis in  the LAD is proximal to the LIMA insertion. The LIMA itself is very  tortuous and the left main is occluded at its origin. We will restart  anti-platelet therapy and aggressively increase medical therapy.    Medications: I have reviewed the patient's current medications.    Marland Kitchen amLODipine  5 mg Oral Daily  . aspirin EC  81 mg Oral Daily  . atorvastatin  80 mg Oral QHS  . clopidogrel  75 mg  Oral Q breakfast  . finasteride  5 mg Oral Daily  . hydrochlorothiazide  12.5 mg Oral Daily  . isosorbide mononitrate  30 mg Oral Daily  . metoprolol tartrate  37.5 mg Oral BID  . pantoprazole  40 mg Oral QAC breakfast  . terazosin  5 mg Oral QHS  . DISCONTD: amLODipine  2.5 mg Oral Daily  . DISCONTD: metoprolol tartrate  37.5 mg Oral BID  . DISCONTD: terazosin  10 mg Oral QHS   Assessment/Plan: Principal Problem:  *NSTEMI (non-ST elevated myocardial infarction) Active Problems:  S/P CABG x 3: LIMA TO LAD, SVG TO OM, SVG TO RCA., VG to OM occluded, jeopardized LCX  CAD (coronary artery disease), severe 4 vessell at cath with 99% LMCA under going CABG 11/2011  Smoker  HTN (hypertension)  PLAN: Chest pain last pm, check EKG and cardiac enzymes, also hepatic panel Glucose  elevated to 132 during hospitalization.  LOS: 4 days   [name],LAURA R 09/02/2012, 8:31 AM   Patient seen and examined. Agree with assessment and plan. Patient did have very mild discomfort yesterday. I have reviewed angios also with Dr. Laneta Simmers who agrees that future re-do CABG may be necessary to LCX. Will continue to advance medical therapy, change Plavix to Brilinta for more effective antiplatelet therapy, titrate imdur to 60 mg and add Ranexa. Will ambulate later today, f/u ECG. Possible dc home tomorrow.   Lennette Bihari, MD, [facility] 09/02/2012 9:52 AM

## 2012-09-03 ENCOUNTER — Inpatient Hospital Stay (HOSPITAL_COMMUNITY): Payer: Medicare Other

## 2012-09-03 DIAGNOSIS — Z951 Presence of aortocoronary bypass graft: Secondary | ICD-10-CM | POA: Diagnosis not present

## 2012-09-03 DIAGNOSIS — I214 Non-ST elevation (NSTEMI) myocardial infarction: Secondary | ICD-10-CM | POA: Diagnosis not present

## 2012-09-03 DIAGNOSIS — Z48812 Encounter for surgical aftercare following surgery on the circulatory system: Secondary | ICD-10-CM | POA: Diagnosis not present

## 2012-09-03 DIAGNOSIS — I10 Essential (primary) hypertension: Secondary | ICD-10-CM | POA: Diagnosis not present

## 2012-09-03 DIAGNOSIS — I2510 Atherosclerotic heart disease of native coronary artery without angina pectoris: Secondary | ICD-10-CM | POA: Diagnosis not present

## 2012-09-03 DIAGNOSIS — I25810 Atherosclerosis of coronary artery bypass graft(s) without angina pectoris: Secondary | ICD-10-CM | POA: Diagnosis not present

## 2012-09-03 LAB — BASIC METABOLIC PANEL
BUN: 19 mg/dL (ref 6–23)
CO2: 23 mEq/L (ref 19–32)
Chloride: 104 mEq/L (ref 96–112)
Glucose, Bld: 85 mg/dL (ref 70–99)
Potassium: 3.5 mEq/L (ref 3.5–5.1)
Sodium: 140 mEq/L (ref 135–145)

## 2012-09-03 LAB — PULMONARY FUNCTION TEST

## 2012-09-03 MED ORDER — ROSUVASTATIN CALCIUM 10 MG PO TABS: 20.0000 mg | ORAL_TABLET | Freq: Every day | ORAL | Status: DC

## 2012-09-03 MED ORDER — TICAGRELOR 90 MG PO TABS: 90.0000 mg | ORAL_TABLET | Freq: Two times a day (BID) | ORAL | Status: DC

## 2012-09-03 MED ORDER — RANOLAZINE ER 500 MG PO TB12: 500.0000 mg | ORAL_TABLET | Freq: Two times a day (BID) | ORAL | Status: DC

## 2012-09-03 MED ORDER — ASPIRIN 81 MG PO TBEC: 81.0000 mg | DELAYED_RELEASE_TABLET | Freq: Every day | ORAL | Status: DC

## 2012-09-03 MED ORDER — ISOSORBIDE MONONITRATE ER 60 MG PO TB24: 60.0000 mg | ORAL_TABLET | Freq: Every day | ORAL | Status: DC

## 2012-09-03 MED ORDER — METOPROLOL TARTRATE 50 MG PO TABS: 50.0000 mg | ORAL_TABLET | Freq: Two times a day (BID) | ORAL | Status: DC

## 2012-09-03 MED ORDER — AMLODIPINE BESYLATE 5 MG PO TABS: 5.0000 mg | ORAL_TABLET | Freq: Every day | ORAL | Status: DC

## 2012-09-03 MED ORDER — ALBUTEROL SULFATE (5 MG/ML) 0.5% IN NEBU
2.5000 mg | INHALATION_SOLUTION | Freq: Once | RESPIRATORY_TRACT | Status: AC
  Administered 2012-09-03: 2.5 mg via RESPIRATORY_TRACT
  Filled 2012-09-03: qty 0.5

## 2012-09-03 MED ORDER — NITROGLYCERIN 0.4 MG SL SUBL: 0.4000 mg | SUBLINGUAL_TABLET | SUBLINGUAL | Status: DC | PRN

## 2012-09-03 MED ORDER — TERAZOSIN HCL 5 MG PO CAPS: 5.0000 mg | ORAL_CAPSULE | Freq: Every day | ORAL | Status: DC

## 2012-09-03 NOTE — Progress Notes (Signed)
VASCULAR LAB PRELIMINARY  PRELIMINARY  PRELIMINARY  PRELIMINARY  Pre-op Cardiac Surgery  Carotid Findings:  Bilateral:  No evidence of hemodynamically significant internal carotid artery stenosis.   Vertebral artery flow is antegrade.     Upper Extremity Right Left  Brachial Pressures 119 Triphasic 113 Triphasic  Radial Waveforms Triphasic Triphasic  Ulnar Waveforms Triphasic Triphasic  Palmar Arch (Allen's Test) Normal Normal   Findings:   Doppler waveforms remained normal bilaterally with both radial and ulnar compressions.   Lower  Extremity Right Left  Dorsalis Pedis    Anterior Tibial    Posterior Tibial    Ankle/Brachial Indices      Findings:  Pedal pulses were palpable bilaterally.   Draken Farrior, RVS 09/03/2012, 2:22 PM

## 2012-09-03 NOTE — Progress Notes (Signed)
[id number] Education completed with pt and wife. Pt can walk independently if not d/ced. Pt did not want to walk right now. Will refer to [facility] Phase 2 with pt's permission. Symone Cornman DunlapRN

## 2012-09-03 NOTE — Progress Notes (Signed)
Subjective: No complaints of pain  Objective: Vital signs in last 24 hours: Temp:  [97.5 F (36.4 C)-98.5 F (36.9 C)] 98.5 F (36.9 C) (10/29 0555) Pulse Rate:  [52-65] 58  (10/29 0555) Resp:  [18] 18  (10/29 0555) BP: (110-124)/(61-75) 124/75 mmHg (10/29 0555) SpO2:  [98 %-100 %] 99 % (10/29 0555) Weight:  [83 kg (182 lb 15.7 oz)] 83 kg (182 lb 15.7 oz) (10/29 0602) Weight change: -0.598 kg (-1 lb 5.1 oz) Last BM Date: 08/30/12 Intake/Output from previous day: +840 10/28 0701 - 10/29 0700 In: 840 [P.O.:840] Out: -  Intake/Output this shift:      . albuterol  2.5 mg Nebulization Once  . amLODipine  5 mg Oral Daily  . aspirin EC  81 mg Oral Daily  . atorvastatin  80 mg Oral QHS  . finasteride  5 mg Oral Daily  . hydrochlorothiazide  12.5 mg Oral Daily  . isosorbide mononitrate  60 mg Oral Daily  . metoprolol tartrate  50 mg Oral BID  . pantoprazole  40 mg Oral QAC breakfast  . ranolazine  500 mg Oral BID  . terazosin  5 mg Oral QHS  . Ticagrelor  90 mg Oral BID    PE: General:Pt not in room has gone to PFTs, will return for exam  No JVD Heart: RRR 1/6 sem Lungs: clear  Abd: soft, BS+ Ext: no edema Neuro: nonfocal   Lab Results:  [facility] 09/01/12 0912  WBC 8.1  HGB 13.1  HCT 38.6*  PLT 152   BMET  Basename 08/31/12 0840  NA 137  K 3.8  CL 109  CO2 21  GLUCOSE 142*  BUN 13  CREATININE 0.79  CALCIUM 8.0*    Basename 09/02/12 1001 08/31/12 0830  TROPONINI 1.21* 3.72*    Lab Results  Component Value Date   CHOL 118 08/30/2012   HDL 34* 08/30/2012   LDLCALC 64 08/30/2012   TRIG 99 08/30/2012   CHOLHDL 3.5 08/30/2012    Hepatic Function Panel  Basename 09/02/12 1002  PROT 7.0  ALBUMIN 3.2*  AST 29  ALT 23  ALKPHOS 76  BILITOT 0.5  BILIDIR 0.1  IBILI 0.4    EKG: Orders placed during the hospital encounter of 08/29/12  . ED EKG  . ED EKG  . EKG 12-LEAD  . EKG 12-LEAD  . EKG 12-LEAD  . EKG 12-LEAD  . EKG 12-LEAD  . EKG  12-LEAD  . EKG 12-LEAD  . EKG 12-LEAD  . EKG 12-LEAD  . EKG 12-LEAD  . EKG 12-LEAD  . EKG 12-LEAD  . EKG 12-LEAD  . EKG 12-LEAD  . EKG 12-LEAD  . EKG 12-LEAD  . EKG 12-LEAD  . EKG 12-LEAD  . EKG 12-LEAD  . EKG 12-LEAD  . EKG 12-LEAD    Studies/Results: No results found.  Medications: I have reviewed the patient's current medications.    Marland Kitchen amLODipine  5 mg Oral Daily  . aspirin EC  81 mg Oral Daily  . atorvastatin  80 mg Oral QHS  . finasteride  5 mg Oral Daily  . hydrochlorothiazide  12.5 mg Oral Daily  . isosorbide mononitrate  60 mg Oral Daily  . metoprolol tartrate  50 mg Oral BID  . pantoprazole  40 mg Oral QAC breakfast  . ranolazine  500 mg Oral BID  . terazosin  5 mg Oral QHS  . Ticagrelor  180 mg Oral Once  . Ticagrelor  90 mg Oral BID  . DISCONTD:  clopidogrel  75 mg Oral Q breakfast  . DISCONTD: isosorbide mononitrate  30 mg Oral Daily  . DISCONTD: metoprolol tartrate  37.5 mg Oral BID   Assessment/Plan: Principal Problem:  *NSTEMI (non-ST elevated myocardial infarction) Active Problems:  S/P CABG x 3: LIMA TO LAD, SVG TO OM, SVG TO RCA., VG to OM occluded, jeopardized LCX  CAD (coronary artery disease), severe 4 vessell at cath with 99% LMCA under going CABG 11/2011  Smoker  HTN (hypertension)  PLAN: no further chest pain.  Meds increased yesterday.  Plan for discharge home today, if continued angina plan would be for re-do CABG.  Plavix changed to Brilinta.  LOS: 5 days   [name],LAURA R 09/03/2012, 7:46 AM   Patient seen and examined. Agree with assessment and plan. Tolerating increased medical regimen. No recurrent chest pain. Will DC today with room for med increase if necessary.   Lennette Bihari, MD, [facility] 09/03/2012 10:58 AM

## 2012-09-03 NOTE — Care Management Note (Signed)
    Page 1 of 1   09/03/2012     4:19:03 PM   CARE MANAGEMENT NOTE 09/03/2012  Patient:  [name], [name]   Account Number:  [id number]  Date Initiated:  08/30/2012  Documentation initiated by:  [name]  Subjective/Objective Assessment:   adm w mi     Action/Plan:   lives w wife, [facility] [place] [facility]   Anticipated DC Date:  09/03/2012   Anticipated DC Plan:  HOME/SELF CARE      DC Planning Services  CM consult      Choice offered to / List presented to:             Status of service:  Completed, signed off Medicare Important Message given?   (If response is "NO", the following Medicare IM given date fields will be blank) Date Medicare IM given:   Date Additional Medicare IM given:    Discharge Disposition:  HOME/SELF CARE  Per UR Regulation:  Reviewed for med. necessity/level of care/duration of stay  If discussed at Long Length of Stay Meetings, dates discussed:    Comments:  10/25 8:37a Darrell dowell rn,bsn [phone]  09/03/12 Darrell Sandeen,RN,BSN [phone] PT STATES HE DOES NOT GET PAID UNTIL FRIDAY AND [facility].  REQUESTS 3 DAYS WORTH OF ALL DC MEDS, IF POSSIBLE SO THAT HE CAN GET [facility]ID[facility] TO GET [facility].  PT ELIGIBLE FOR MEDICATION ASSISTANCE THROUGH MAIN PHARMACY [facility].  RX SENT TO MAIN PHARMACY TO BE FILLED--PHARMACY TO CALL NURSE WHEN RX READY.

## 2012-09-03 NOTE — Progress Notes (Signed)
PFT done. Unconfirmed copy placed in shadow chart.  Inocente Salles RRT, RCP 09/03/2012 9:27 AM

## 2012-09-03 NOTE — Discharge Summary (Signed)
Physician Discharge Summary  Patient ID: [name]. MRN: [id number] DOB/AGE: 1945-11-17 66 y.o.  Admit date: 08/29/2012 Discharge date: 09/03/2012  Discharge Diagnoses:  Principal Problem:  *NSTEMI (non-ST elevated myocardial infarction) Active Problems:  S/P CABG x 3: LIMA TO LAD, SVG TO OM, SVG TO RCA., VG to OM occluded, jeopardized LCX  CAD (coronary artery disease), severe 4 vessell at cath with 99% LMCA under going CABG 11/2011  Smoker  HTN (hypertension)  Procedures: cardiac cath 08/30/12 by Dr. Tresa Endo  Discharged Condition: good  Hospital Course: 66 y.o. male with a past medical history significant for acute anterior STEMI in January of 2013. EKG at that time showed marked anterolateral ST depression and cardiac enzymes were positive. Code STEMI called and cath showed 99% ostial Left Main, moderate mid LAD stenosis, and moderate RCA stenosis. LV gram shows anteroapical hypokinesis. IABP inserted for anatomy and ongoing chest pain. He was taken emergently to the OR and underwent 3 vessel CABG (LIMA to LAD, SVG to OM and SVG to RCA). He apparently has been doing well and recently saw Dr. Allyson Sabal in the office. He now presented to the ER with 2 days of chest pain on 08/29/12. He did not take nitroglycerin however, the pain became severe and he came via EMS. Intially, code STEMI was called in the ER. Dr. Rennis Golden reviewed the EKG which showed 1 mm ST elevation in AVR and anterolateral ST depression <8mm. This was not consistent with STEMI, in fact, the EKG looks better than in January. Despite this, he has had a NSTEMI. This may represent graft disease or closure. He was chest pain free in the ER. Pt. Was admitted for acute coronary syndrome/NSTEMI and placed on IV Heparin and IV NTG.  Pt did have NSTEMI and follow up EKG with ST depression in V2-V6 and pt was cathed later that day. RESULTS: LM: occluded  LIMA to LAD: patent supplies LAD and retrograde to LCX with 80% prox LAD stenosis  prior to LIMA insertion jeopardizing LCX.  RCA: scattered native disease with patent SVG to RCA  SVG to OM: occluded  EF 50 - 55% Dr. Tresa Endo felt medical management would be best at this point.  Medications were adjusted, pt did well though he did have some chest pressure on the 27th and meds were again adjusted.  Dr. Tresa Endo discussed the case with the surgeon Dr. Laneta Simmers and it was felt if the pt. Failed medical therapy  He would need re-do CABG.  At discharge on the 29th he was stable without pain with walking.  Seen and felt ready for discharge by Dr. Tresa Endo.    Consults: vascular surgery  Significant Diagnostic Studies:cardiac enzymes:  pk troponin 9.92   T. Chol 118, trig 99, HDL 34, LDL 64,  BMET    Component Value Date/Time   NA 140 09/03/2012 0630   K 3.5 09/03/2012 0630   CL 104 09/03/2012 0630   CO2 23 09/03/2012 0630   GLUCOSE 85 09/03/2012 0630   BUN 19 09/03/2012 0630   CREATININE 0.80 09/03/2012 0630   CALCIUM 9.0 09/03/2012 0630   GFRNONAA >90 09/03/2012 0630   GFRAA >90 09/03/2012 0630    CBC    Component Value Date/Time   WBC 8.1 09/01/2012 0912   RBC 4.35 09/01/2012 0912   HGB 13.1 09/01/2012 0912   HCT 38.6* 09/01/2012 0912   PLT 152 09/01/2012 0912   MCV 88.7 09/01/2012 0912   MCH 30.1 09/01/2012 0912   MCHC 33.9 09/01/2012  0912   RDW 14.4 09/01/2012 0912   LYMPHSABS 1.9 12/02/2011 0941   MONOABS 0.8 12/02/2011 0941   EOSABS 0.0 12/02/2011 0941   BASOSABS 0.0 12/02/2011 0941   HGB A1C pending at time of discharge.   PORTABLE CHEST - 1 VIEW  Comparison: Portable exam 1536 hours compared to 12/25/2011  Findings:  Mild enlargement of cardiac silhouette post CABG.  Mediastinal contours and pulmonary vascularity normal.  Mild atherosclerotic calcification aortic arch.  Lungs grossly clear.  No pleural effusion, pneumothorax or acute osseous findings.  IMPRESSION:  Minimal enlargement of cardiac silhouette post CABG.  No acute abnormalities.     Discharge Exam: Blood pressure 116/70, pulse 58, temperature 98.5 F (36.9 C), temperature source Oral, resp. rate 18, height 5' 9.5" (1.765 m), weight 83 kg (182 lb 15.7 oz), SpO2 99.00%.   No JVD  Heart: RRR 1/6 sem  Lungs: clear  Abd: soft, BS+  Ext: no edema  Neuro: nonfocal  Disposition: 01-Home or Self Care  Discharge Orders    Future Orders Please Complete By Expires   Amb Referral to Cardiac Rehabilitation          Medication List     As of 09/03/2012 12:31 PM    TAKE these medications         amLODipine 5 MG tablet   Commonly known as: NORVASC   Take 1 tablet (5 mg total) by mouth daily.      aspirin 81 MG EC tablet   Take 1 tablet (81 mg total) by mouth daily.      finasteride 5 MG tablet   Commonly known as: PROSCAR   Take 5 mg by mouth daily.      hydrochlorothiazide 25 MG tablet   Commonly known as: HYDRODIURIL   Take 12.5 mg by mouth daily.      HYDROcodone-acetaminophen 5-500 MG per tablet   Commonly known as: VICODIN   Take 1 tablet by mouth every 6 (six) hours as needed. For pain      hydrOXYzine 25 MG tablet   Commonly known as: ATARAX/VISTARIL   Take 50 mg by mouth 3 (three) times daily as needed. For allergies.      isosorbide mononitrate 60 MG 24 hr tablet   Commonly known as: IMDUR   Take 1 tablet (60 mg total) by mouth daily.      metoprolol 50 MG tablet   Commonly known as: LOPRESSOR   Take 1 tablet (50 mg total) by mouth 2 (two) times daily.      nitroGLYCERIN 0.4 MG SL tablet   Commonly known as: NITROSTAT   Place 1 tablet (0.4 mg total) under the tongue every 5 (five) minutes x 3 doses as needed for chest pain.      omeprazole 20 MG capsule   Commonly known as: PRILOSEC   Take 20 mg by mouth 2 (two) times daily.      ranolazine 500 MG 12 hr tablet   Commonly known as: RANEXA   Take 1 tablet (500 mg total) by mouth 2 (two) times daily.      rosuvastatin 10 MG tablet   Commonly known as: CRESTOR   Take 2 tablets (20  mg total) by mouth daily at 6 PM.      terazosin 5 MG capsule   Commonly known as: HYTRIN   Take 1 capsule (5 mg total) by mouth at bedtime.      Ticagrelor 90 MG Tabs tablet   Commonly known as: [employer]  Take 1 tablet (90 mg total) by mouth 2 (two) times daily.           Follow-up Information    Follow up with Runell Gess, MD. On 09/16/2012. (at 10:20 am, with Dr. Hazle Coca PA, [facility])    Contact information:   [address] [place] [address] [phone]        Discharge Instructions: Call [facility] if any bleeding, swelling or drainage at cath site.  May shower, no tub baths for 48 hours for groin sticks.   Heart Healthy Diet.    No driving for 1 week.  No strenuous activity for 2 weeks, except through cardiac rehab.  Take 1 NTG, under your tongue, while sitting.  If no relief of pain may repeat NTG, one tab every 5 minutes up to 3 tablets total over 15 minutes.  If no relief CALL 911.  If you have dizziness/lightheadness  while taking NTG, stop taking and call 911.          SignedLeone Brand 09/03/2012, 12:31 PM

## 2012-09-03 NOTE — Progress Notes (Signed)
Patient given prescriptions, medications from pharmacy and discharge instructions, patient will be discharged home as ordered via wheelchair with wife. [name], Randall An RN

## 2012-09-10 ENCOUNTER — Encounter (HOSPITAL_COMMUNITY): Payer: Self-pay | Admitting: Cardiology

## 2012-09-10 ENCOUNTER — Emergency Department (HOSPITAL_COMMUNITY): Payer: Medicare Other

## 2012-09-10 ENCOUNTER — Inpatient Hospital Stay (HOSPITAL_COMMUNITY)
Admission: EM | Admit: 2012-09-10 | Discharge: 2012-09-23 | DRG: 234 | Disposition: A | Discharge status: Home / Self Care | Payer: Medicare Other | Attending: Surgery | Admitting: Surgery

## 2012-09-10 DIAGNOSIS — I2510 Atherosclerotic heart disease of native coronary artery without angina pectoris: Principal | ICD-10-CM | POA: Diagnosis present

## 2012-09-10 DIAGNOSIS — I252 Old myocardial infarction: Secondary | ICD-10-CM

## 2012-09-10 DIAGNOSIS — Z79899 Other long term (current) drug therapy: Secondary | ICD-10-CM | POA: Diagnosis not present

## 2012-09-10 DIAGNOSIS — R079 Chest pain, unspecified: Secondary | ICD-10-CM | POA: Diagnosis not present

## 2012-09-10 DIAGNOSIS — I214 Non-ST elevation (NSTEMI) myocardial infarction: Secondary | ICD-10-CM | POA: Diagnosis present

## 2012-09-10 DIAGNOSIS — D62 Acute posthemorrhagic anemia: Secondary | ICD-10-CM | POA: Diagnosis not present

## 2012-09-10 DIAGNOSIS — Z87891 Personal history of nicotine dependence: Secondary | ICD-10-CM | POA: Diagnosis not present

## 2012-09-10 DIAGNOSIS — E8779 Other fluid overload: Secondary | ICD-10-CM | POA: Diagnosis not present

## 2012-09-10 DIAGNOSIS — Z7982 Long term (current) use of aspirin: Secondary | ICD-10-CM | POA: Diagnosis not present

## 2012-09-10 DIAGNOSIS — I200 Unstable angina: Secondary | ICD-10-CM | POA: Diagnosis not present

## 2012-09-10 DIAGNOSIS — N400 Enlarged prostate without lower urinary tract symptoms: Secondary | ICD-10-CM | POA: Diagnosis present

## 2012-09-10 DIAGNOSIS — I25810 Atherosclerosis of coronary artery bypass graft(s) without angina pectoris: Secondary | ICD-10-CM | POA: Diagnosis not present

## 2012-09-10 DIAGNOSIS — J9819 Other pulmonary collapse: Secondary | ICD-10-CM | POA: Diagnosis not present

## 2012-09-10 DIAGNOSIS — I10 Essential (primary) hypertension: Secondary | ICD-10-CM | POA: Diagnosis not present

## 2012-09-10 DIAGNOSIS — E785 Hyperlipidemia, unspecified: Secondary | ICD-10-CM | POA: Diagnosis present

## 2012-09-10 DIAGNOSIS — Z8613 Personal history of malaria: Secondary | ICD-10-CM

## 2012-09-10 DIAGNOSIS — D696 Thrombocytopenia, unspecified: Secondary | ICD-10-CM | POA: Diagnosis not present

## 2012-09-10 DIAGNOSIS — I2582 Chronic total occlusion of coronary artery: Secondary | ICD-10-CM | POA: Diagnosis present

## 2012-09-10 DIAGNOSIS — F4310 Post-traumatic stress disorder, unspecified: Secondary | ICD-10-CM | POA: Diagnosis present

## 2012-09-10 DIAGNOSIS — Z951 Presence of aortocoronary bypass graft: Secondary | ICD-10-CM | POA: Diagnosis not present

## 2012-09-10 DIAGNOSIS — I255 Ischemic cardiomyopathy: Secondary | ICD-10-CM | POA: Diagnosis present

## 2012-09-10 DIAGNOSIS — E7800 Pure hypercholesterolemia, unspecified: Secondary | ICD-10-CM | POA: Diagnosis present

## 2012-09-10 DIAGNOSIS — J3489 Other specified disorders of nose and nasal sinuses: Secondary | ICD-10-CM | POA: Diagnosis present

## 2012-09-10 DIAGNOSIS — I2589 Other forms of chronic ischemic heart disease: Secondary | ICD-10-CM | POA: Diagnosis present

## 2012-09-10 DIAGNOSIS — J90 Pleural effusion, not elsewhere classified: Secondary | ICD-10-CM | POA: Diagnosis not present

## 2012-09-10 DIAGNOSIS — Z9889 Other specified postprocedural states: Secondary | ICD-10-CM | POA: Diagnosis not present

## 2012-09-10 DIAGNOSIS — M79609 Pain in unspecified limb: Secondary | ICD-10-CM

## 2012-09-10 LAB — BASIC METABOLIC PANEL
Calcium: 9.8 mg/dL (ref 8.4–10.5)
GFR calc non Af Amer: 73 mL/min — ABNORMAL LOW (ref >90)
Glucose, Bld: 118 mg/dL — ABNORMAL HIGH (ref 70–99)
Potassium: 3.9 mEq/L (ref 3.5–5.1)
Sodium: 136 mEq/L (ref 135–145)

## 2012-09-10 LAB — CBC WITH DIFFERENTIAL/PLATELET
Basophils Absolute: 0 10*3/uL (ref 0.0–0.1)
Eosinophils Absolute: 0 10*3/uL (ref 0.0–0.7)
Eosinophils Relative: 0 % (ref 0–5)
Lymphocytes Relative: 18 % (ref 12–46)
Lymphs Abs: 2.1 10*3/uL (ref 0.7–4.0)
MCH: 31.4 pg (ref 26.0–34.0)
Neutrophils Relative %: 74 % (ref 43–77)
Platelets: 170 10*3/uL (ref 150–400)
RBC: 4.65 MIL/uL (ref 4.22–5.81)
RDW: 14.3 % (ref 11.5–15.5)
WBC: 11.4 10*3/uL — ABNORMAL HIGH (ref 4.0–10.5)

## 2012-09-10 LAB — PRO B NATRIURETIC PEPTIDE: Pro B Natriuretic peptide (BNP): 1987 pg/mL — ABNORMAL HIGH (ref 0–125)

## 2012-09-10 LAB — APTT
aPTT: 31 seconds (ref 24–37)
aPTT: 30 seconds (ref 24–37)

## 2012-09-10 LAB — PROTIME-INR
INR: 1.1 (ref 0.00–1.49)
Prothrombin Time: 14.8 seconds (ref 11.6–15.2)

## 2012-09-10 LAB — TROPONIN I
Troponin I: <0.3 ng/mL (ref <0.30)
Troponin I: <0.3 ng/mL (ref <0.30)

## 2012-09-10 LAB — CK TOTAL AND CKMB (NOT AT ARMC): Total CK: 35 U/L (ref 7–232)

## 2012-09-10 LAB — HEPARIN LEVEL (UNFRACTIONATED): Heparin Unfractionated: 0.55 IU/mL (ref 0.30–0.70)

## 2012-09-10 LAB — MAGNESIUM: Magnesium: 1.9 mg/dL (ref 1.5–2.5)

## 2012-09-10 MED ORDER — HYDROCHLOROTHIAZIDE 25 MG PO TABS
12.5000 mg | ORAL_TABLET | Freq: Every day | ORAL | Status: DC
  Administered 2012-09-11 – 2012-09-12 (×2): 12.5 mg via ORAL
  Filled 2012-09-10 (×3): qty 0.5

## 2012-09-10 MED ORDER — NITROGLYCERIN 0.4 MG SL SUBL: 0.4000 mg | SUBLINGUAL_TABLET | SUBLINGUAL | Status: DC | PRN

## 2012-09-10 MED ORDER — METOPROLOL TARTRATE 50 MG PO TABS
50.0000 mg | ORAL_TABLET | Freq: Two times a day (BID) | ORAL | Status: DC
  Administered 2012-09-10 – 2012-09-12 (×4): 50 mg via ORAL
  Administered 2012-09-13: 25 mg via ORAL
  Filled 2012-09-10 (×7): qty 1

## 2012-09-10 MED ORDER — ASPIRIN EC 81 MG PO TBEC
81.0000 mg | DELAYED_RELEASE_TABLET | Freq: Every day | ORAL | Status: DC
  Administered 2012-09-11: 81 mg via ORAL
  Filled 2012-09-10: qty 1

## 2012-09-10 MED ORDER — RANOLAZINE ER 500 MG PO TB12
500.0000 mg | ORAL_TABLET | Freq: Two times a day (BID) | ORAL | Status: DC
  Administered 2012-09-10 – 2012-09-11 (×2): 500 mg via ORAL
  Filled 2012-09-10 (×3): qty 1

## 2012-09-10 MED ORDER — HEPARIN BOLUS VIA INFUSION
4000.0000 [IU] | Freq: Once | INTRAVENOUS | Status: AC
  Administered 2012-09-10: 4000 [IU] via INTRAVENOUS

## 2012-09-10 MED ORDER — ACETAMINOPHEN 325 MG PO TABS: 650.0000 mg | ORAL_TABLET | ORAL | Status: DC | PRN

## 2012-09-10 MED ORDER — TICAGRELOR 90 MG PO TABS
90.0000 mg | ORAL_TABLET | Freq: Two times a day (BID) | ORAL | Status: DC
  Administered 2012-09-10: 90 mg via ORAL
  Filled 2012-09-10 (×3): qty 1

## 2012-09-10 MED ORDER — HEPARIN (PORCINE) IN NACL 100-0.45 UNIT/ML-% IJ SOLN
1300.0000 [IU]/h | INTRAMUSCULAR | Status: DC
  Administered 2012-09-10: 1300 [IU]/h via INTRAVENOUS
  Filled 2012-09-10 (×3): qty 250

## 2012-09-10 MED ORDER — NITROGLYCERIN IN D5W 200-5 MCG/ML-% IV SOLN
5.0000 ug/min | INTRAVENOUS | Status: DC
  Administered 2012-09-10: 5 ug/min via INTRAVENOUS
  Filled 2012-09-10: qty 250

## 2012-09-10 MED ORDER — ATORVASTATIN CALCIUM 80 MG PO TABS
80.0000 mg | ORAL_TABLET | Freq: Every day | ORAL | Status: DC
  Administered 2012-09-10 – 2012-09-22 (×12): 80 mg via ORAL
  Filled 2012-09-10 (×14): qty 1

## 2012-09-10 MED ORDER — ZOLPIDEM TARTRATE 5 MG PO TABS: 5.0000 mg | ORAL_TABLET | Freq: Every evening | ORAL | Status: DC | PRN

## 2012-09-10 MED ORDER — ASPIRIN 81 MG PO CHEW
324.0000 mg | CHEWABLE_TABLET | Freq: Once | ORAL | Status: AC
  Administered 2012-09-10: 243 mg via ORAL
  Filled 2012-09-10: qty 3

## 2012-09-10 MED ORDER — TERAZOSIN HCL 5 MG PO CAPS
5.0000 mg | ORAL_CAPSULE | Freq: Every day | ORAL | Status: DC
  Administered 2012-09-10 – 2012-09-19 (×10): 5 mg via ORAL
  Filled 2012-09-10 (×12): qty 1

## 2012-09-10 MED ORDER — ALPRAZOLAM 0.25 MG PO TABS: 0.2500 mg | ORAL_TABLET | Freq: Two times a day (BID) | ORAL | Status: DC | PRN

## 2012-09-10 MED ORDER — PANTOPRAZOLE SODIUM 40 MG PO TBEC
80.0000 mg | DELAYED_RELEASE_TABLET | Freq: Every day | ORAL | Status: DC
  Administered 2012-09-11 – 2012-09-18 (×8): 80 mg via ORAL
  Filled 2012-09-10 (×2): qty 2
  Filled 2012-09-10 (×5): qty 1
  Filled 2012-09-10 (×2): qty 2
  Filled 2012-09-10: qty 1
  Filled 2012-09-10: qty 2

## 2012-09-10 MED ORDER — ONDANSETRON HCL 4 MG/2ML IJ SOLN: 4.0000 mg | Freq: Four times a day (QID) | INTRAMUSCULAR | Status: DC | PRN

## 2012-09-10 MED ORDER — SODIUM CHLORIDE 0.9 % IV SOLN
INTRAVENOUS | Status: DC
  Administered 2012-09-10: 12:00:00 via INTRAVENOUS

## 2012-09-10 MED ORDER — HYDROCODONE-ACETAMINOPHEN 10-325 MG PO TABS: 1.0000 | ORAL_TABLET | Freq: Four times a day (QID) | ORAL | Status: DC | PRN

## 2012-09-10 MED ORDER — ASPIRIN 81 MG PO TBEC: 81.0000 mg | DELAYED_RELEASE_TABLET | Freq: Every day | ORAL | Status: DC

## 2012-09-10 MED ORDER — AMLODIPINE BESYLATE 5 MG PO TABS
5.0000 mg | ORAL_TABLET | Freq: Every day | ORAL | Status: DC
  Administered 2012-09-11 – 2012-09-18 (×8): 5 mg via ORAL
  Filled 2012-09-10 (×9): qty 1

## 2012-09-10 MED ORDER — FINASTERIDE 5 MG PO TABS
5.0000 mg | ORAL_TABLET | Freq: Every day | ORAL | Status: DC
  Administered 2012-09-11 – 2012-09-18 (×8): 5 mg via ORAL
  Filled 2012-09-10 (×9): qty 1

## 2012-09-10 MED ORDER — ASPIRIN 81 MG PO TBEC
81.0000 mg | DELAYED_RELEASE_TABLET | Freq: Every day | ORAL | Status: DC
Start: 2012-09-10 — End: 2012-09-10

## 2012-09-10 NOTE — ED Notes (Signed)
Port cxr completed.

## 2012-09-10 NOTE — Progress Notes (Signed)
VASCULAR LAB PRELIMINARY  PRELIMINARY  PRELIMINARY  PRELIMINARY  Right groin evaluation for pseudoaneurysm completed.    Preliminary report:  Negative for right femoral pseudoaneurysm. Positive for enlarged inguinal lymph nodes  Cuong Moorman, RVS 09/10/2012, 1:31 PM

## 2012-09-10 NOTE — ED Notes (Signed)
Pt recently released from Hospital. C/o ongoing chest pressure since d/c. Worse this am

## 2012-09-10 NOTE — Progress Notes (Signed)
ANTICOAGULATION CONSULT NOTE - Follow up Consult  Pharmacy Consult for Heparin Indication: chest pain/ACS  No Known Allergies  Patient Measurements: Height: 5\' 9"  (175.3 cm) Weight: 183 lb 3.2 oz (83.1 kg) IBW/kg (Calculated) : 70.7  Heparin Dosing Weight: 83 kg  Vital Signs: Temp: 98.5 F (36.9 C) (11/05 1600) Temp src: Oral (11/05 1600) BP: 120/56 mmHg (11/05 1900) Pulse Rate: 58  (11/05 1900)  Labs:  Basename 09/10/12 2008 09/10/12 1738 09/10/12 1121 09/10/12 1120 09/10/12 0916  HGB -- -- -- -- 14.6  HCT -- -- -- -- 40.3  PLT -- -- -- -- 170  APTT -- -- 30 -- 31  LABPROT -- -- 14.1 -- 14.8  INR -- -- 1.10 -- 1.18  HEPARINUNFRC 0.55 -- -- -- --  CREATININE -- -- -- -- 1.05  CKTOTAL -- -- -- 35 --  CKMB -- -- -- 2.1 --  TROPONINI -- <0.30 -- <0.30 <0.30    Estimated Creatinine Clearance: 71.1 ml/min (by C-G formula based on Cr of 1.05).   Medical History: Past Medical History  Diagnosis Date  . Hypertension   . Hypercholesterolemia   . Anginal pain   . DDD (degenerative disc disease), cervical   . Arthritis     "in my back and neck" (2012-09-22)  . Malaria     "I've had it a couple times in the 1960's; almost died from it" (09/22/2012)  . PTSD (post-traumatic stress disorder)   . NSTEMI (non-ST elevated myocardial infarction) 11/2011    anterior  . NSTEMI (non-ST elevated myocardial infarction) 2012/09/22  . Coronary artery disease     Assessment: 66 y.o. M with extensive cardiac history including 3V CABG in January 2013 who presents to the [facility] on 11/5 with chest pressure. First set of cardiac enzymes negative. Pharmacy was consulted to start heparin while awaiting further cardiac evaluation. Hep wt~83 kg, Hgb/Hct/Plt wnl.   Patient known to pharmacy service for previous heparin dosing, initial heparin level is at goal at 0.5. No bleeding issues have been noted.   Vascular report was negative for femoral pseudoaneurysm but positive for enlarged inguinal  lymph nodes.  Goal of Therapy:  Heparin level 0.3-0.7 units/ml Monitor platelets by anticoagulation protocol: Yes   Plan:  1. Continue heparin drip at rate of 1300 units/hr  3. Daily heparin levels, CBC 4. Will continue to monitor for any signs/symptoms of bleeding  Sheppard Coil, PharmD, BCPS Clinical Pharmacist 09/10/2012 9:11 PM

## 2012-09-10 NOTE — ED Provider Notes (Signed)
History     CSN: [id number]  Arrival date & time 09/10/12  0901   First MD Initiated Contact with Patient 09/10/12 [phone]      Chief Complaint  Patient presents with  . Chest Pain    (Consider location/radiation/quality/duration/timing/severity/associated sxs/prior treatment) HPI Pt with history of CABG in Jan 2013 had a readmission about 10 days ago for NSTEMI. During that admission, repeat Cath showed LAD lesion proximal to LIMA graft. He was started on aggressive medical management with plan to redo CABG if he failed. He reports intermittent, nightly episodes of severe midsternal chest pressure over the last several days, lasting for an hour or more and resolving spontaneously. He has not taken any NTG but reports compliance with his other medications. He is pain free at the time of my evaluation. His pain has been associated with SOB. He also reports moderate aching pain and swelling at the site of his cath, R inguinal area.   Past Medical History  Diagnosis Date  . Hypertension   . Hypercholesterolemia   . Anginal pain   . DDD (degenerative disc disease), cervical   . Arthritis     "in my back and neck" (2012-09-11)  . Malaria     "I've had it a couple times in the 1960's; almost died from it" (11-Sep-2012)  . PTSD (post-traumatic stress disorder)   . NSTEMI (non-ST elevated myocardial infarction) 11/2011    anterior  . NSTEMI (non-ST elevated myocardial infarction) Sep 11, 2012  . Coronary artery disease     Past Surgical History  Procedure Date  . Back surgery   . Lumbar disc surgery 1980's?  . Tonsillectomy     "as a kid" (09/11/2012)  . Coronary artery bypass graft 12/02/2011    Procedure: CORONARY ARTERY BYPASS GRAFTING (CABG);  Surgeon: Alleen Borne, MD;  Location: [facility] OR;  Service: Open Heart Surgery;  Laterality: N/A;  . Cardiac catheterization 11/2011  . Cataract extraction w/ intraocular lens  implant, bilateral 1990's    No family history on file.  History    Substance Use Topics  . Smoking status: Former Smoker -- 0.5 packs/day for 50 years    Types: Cigarettes    Quit date: 12/02/2011  . Smokeless tobacco: Never Used  . Alcohol Use: Yes     Comment: 09-11-2012 "haven't had a drink in > 25 years; never had problem w/it"      Review of Systems All other systems reviewed and are negative except as noted in HPI.   Allergies  Review of patient's allergies indicates no known allergies.  Home Medications   Current Outpatient Rx  Name  Route  Sig  Dispense  Refill  . AMLODIPINE BESYLATE 5 MG PO TABS   Oral   Take 5 mg by mouth daily.         . ASPIRIN 81 MG PO TBEC   Oral   Take 81 mg by mouth daily.         Marland Kitchen FINASTERIDE 5 MG PO TABS   Oral   Take 5 mg by mouth daily.         Marland Kitchen HYDROCHLOROTHIAZIDE 25 MG PO TABS   Oral   Take 12.5 mg by mouth daily.         Marland Kitchen HYDROCODONE-ACETAMINOPHEN 5-500 MG PO TABS   Oral   Take 1 tablet by mouth every 6 (six) hours as needed. For pain         . ISOSORBIDE MONONITRATE ER 60 MG PO TB24  Oral   Take 60 mg by mouth daily.         Marland Kitchen METOPROLOL TARTRATE 50 MG PO TABS   Oral   Take 50 mg by mouth 2 (two) times daily.         Marland Kitchen NITROGLYCERIN 0.4 MG SL SUBL   Sublingual   Place 0.4 mg under the tongue every 5 (five) minutes x 3 doses as needed.         Marland Kitchen OMEPRAZOLE 20 MG PO CPDR   Oral   Take 20 mg by mouth 2 (two) times daily.         Marland Kitchen RANOLAZINE ER 500 MG PO TB12   Oral   Take 500 mg by mouth 2 (two) times daily.         Marland Kitchen ROSUVASTATIN CALCIUM 10 MG PO TABS   Oral   Take 20 mg by mouth daily at 6 PM.         . TERAZOSIN HCL 5 MG PO CAPS   Oral   Take 5 mg by mouth at bedtime.         Marland Kitchen TICAGRELOR 90 MG PO TABS   Oral   Take 90 mg by mouth 2 (two) times daily.         Marland Kitchen HYDROXYZINE HCL 25 MG PO TABS   Oral   Take 50 mg by mouth 3 (three) times daily as needed. For allergies.           BP 111/69  Pulse 64  Temp 98.4 F (36.9 C) (Oral)   Resp 12  SpO2 100%  Physical Exam  Nursing note and vitals reviewed. Constitutional: He is oriented to person, place, and time. He appears well-developed and well-nourished.  HENT:  Head: Normocephalic and atraumatic.  Eyes: EOM are normal. Pupils are equal, round, and reactive to light.  Neck: Normal range of motion. Neck supple.  Cardiovascular: Normal rate, normal heart sounds and intact distal pulses.   Pulmonary/Chest: Effort normal and breath sounds normal.  Abdominal: Bowel sounds are normal. He exhibits no distension. There is no tenderness.  Musculoskeletal: Normal range of motion. He exhibits tenderness. He exhibits no edema.       Mild tenderness and healing ecchymosis of R inguinal puncture site, no pulsatile masses  Neurological: He is alert and oriented to person, place, and time. He has normal strength. No cranial nerve deficit or sensory deficit.  Skin: Skin is warm and dry. No rash noted.  Psychiatric: He has a normal mood and affect.    ED Course  Procedures (including critical care time)  Labs Reviewed  CBC WITH DIFFERENTIAL - Abnormal; Notable for the following:    WBC 11.4 (*)     MCHC 36.2 (*)     Neutro Abs 8.4 (*)     All other components within normal limits  BASIC METABOLIC PANEL - Abnormal; Notable for the following:    Glucose, Bld 118 (*)     GFR calc non Af Amer 73 (*)     GFR calc Af Amer 85 (*)     All other components within normal limits  TROPONIN I  APTT  PROTIME-INR  TROPONIN I  TROPONIN I  TROPONIN I  PROTIME-INR  APTT  TSH  MAGNESIUM  PRO B NATRIURETIC PEPTIDE  CK TOTAL AND CKMB   Dg Chest Port 1 View  09/10/2012  *RADIOLOGY REPORT*  Clinical Data: Chest pressure  PORTABLE CHEST - 1 VIEW  Comparison: 08/29/2012  Findings: There  is poor inspiration.  Cardiomegaly again noted. Status post CABG.  Mild interstitial prominence bilaterally without convincing pulmonary edema.  Mild basilar atelectasis.  IMPRESSION: Limited study by poor  inspiration.  Mild basilar atelectasis.  No convincing pulmonary edema.   Original Report Authenticated By: Natasha Mead, M.D.      1. Unstable angina       MDM   Date: 09/10/2012  Rate: 67  Rhythm: normal sinus rhythm  QRS Axis: normal  Intervals: normal  ST/T Wave abnormalities: ST depressions laterally  Conduction Disutrbances:none  Narrative Interpretation:   Old EKG Reviewed: changes noted, lateral ST depressions more pronounced than previous  10:13 AM Discussed with Elberta Leatherwood with [facility]. They will evaluate in the ED.        Gari Trovato B. Bernette Mayers, MD 09/10/12 1152

## 2012-09-10 NOTE — ED Notes (Signed)
Patient is resting comfortably. 

## 2012-09-10 NOTE — H&P (Signed)
[name]. is an 66 y.o. male.    Cardiologist Dr. Allyson Sabal  Chief Complaint: chest pressure HPI: 66 y.o. male with a past medical history significant for acute anterior STEMI in January of 2013. EKG at that time showed marked anterolateral ST depression and cardiac enzymes were positive. Code STEMI called and cath showed 99% ostial Left Main, moderate mid LAD stenosis, and moderate RCA stenosis. LV gram shows anteroapical hypokinesis. IABP inserted for anatomy and ongoing chest pain. He was taken emergently to the OR and underwent 3 vessel CABG (LIMA to LAD, SVG to OM and SVG to RCA).  He had been doing well until September 26, 2012 and he presented with NSTEMI.  Cardiac cath revealed LM: occluded  LIMA to LAD: patent supplies LAD and retrograde to LCX with 80% prox LAD stenosis prior to LIMA insertion jeopardizing LCX;  RCA: scattered native disease with patent SVG to RCA;SVG to OM: occluded  EF 50 - 55% Dr. Tresa Endo felt medical management would be best at this point.  Dr. Laneta Simmers also reviewed the films and plan was if medical management failure then he would need Re-do CABG.   Pt now present to ER with chest pressure off and on since Sunday.  He stated he did well for several days but when he awakens at 0300 is begins with chest pressure and mild radiation to jaw.  He is also SOB-wife explains severe SOB, the pt mild SOB.  No nausea, no diaphoresis.    EKG:  Mild St depression lateral leads  Past Medical History  Diagnosis Date  . Hypertension   . Hypercholesterolemia   . Anginal pain   . DDD (degenerative disc disease), cervical   . Arthritis     "in my back and neck" (26-Sep-2012)  . Malaria     "I've had it a couple times in the 1960's; almost died from it" (Sep 26, 2012)  . PTSD (post-traumatic stress disorder)   . NSTEMI (non-ST elevated myocardial infarction) 11/2011    anterior  . NSTEMI (non-ST elevated myocardial infarction) 09/26/12  . Coronary artery disease     Past Surgical  History  Procedure Date  . Back surgery   . Lumbar disc surgery 1980's?  . Tonsillectomy     "as a kid" (September 26, 2012)  . Coronary artery bypass graft 12/02/2011    Procedure: CORONARY ARTERY BYPASS GRAFTING (CABG);  Surgeon: Alleen Borne, MD;  Location: [facility] OR;  Service: Open Heart Surgery;  Laterality: N/A;  . Cardiac catheterization 11/2011  . Cataract extraction w/ intraocular lens  implant, bilateral 1990's    History reviewed. No pertinent family history. Social History:  reports that he quit smoking about 9 months ago. His smoking use included Cigarettes. He has a 25 pack-year smoking history. He has never used smokeless tobacco. He reports that he drinks alcohol. He reports that he does not use illicit drugs.  Allergies: No Known Allergies  OUTPATIENT MEDICATIONS: Norvasc 5 mg one daily Aspirin 81 mg daily Proscar 5 mg daily Hydro-Diuril 25 mg tablet take half a tablet daily to equal 12.5 mg daily Hydrocodone with Tylenol 5/500 one every 6 hours.prn Endur 60 mg daily Lopressor 50 mg twice daily Nitroglycerin sublingual. Prilosec 20 mg twice a day Number next the 500 mg twice a day Crestor 20 mg daily at 6 PM Hytrin 5 mg at bedtime next 1  90 mg twice a day   Results for orders placed during the hospital encounter of 09/10/12 (from the past 48 hour(s))  CBC  WITH DIFFERENTIAL     Status: Abnormal   Collection Time   09/10/12  9:16 AM      Component Value Range Comment   WBC 11.4 (*) 4.0 - 10.5 K/uL    RBC 4.65  4.22 - 5.81 MIL/uL    Hemoglobin 14.6  13.0 - 17.0 g/dL    HCT 95.6  21.3 - 08.6 %    MCV 86.7  78.0 - 100.0 fL    MCH 31.4  26.0 - 34.0 pg    MCHC 36.2 (*) 30.0 - 36.0 g/dL    RDW 57.8  46.9 - 62.9 %    Platelets 170  150 - 400 K/uL    Neutrophils Relative 74  43 - 77 %    Neutro Abs 8.4 (*) 1.7 - 7.7 K/uL    Lymphocytes Relative 18  12 - 46 %    Lymphs Abs 2.1  0.7 - 4.0 K/uL    Monocytes Relative 7  3 - 12 %    Monocytes Absolute 0.8  0.1 - 1.0  K/uL    Eosinophils Relative 0  0 - 5 %    Eosinophils Absolute 0.0  0.0 - 0.7 K/uL    Basophils Relative 0  0 - 1 %    Basophils Absolute 0.0  0.0 - 0.1 K/uL   BASIC METABOLIC PANEL     Status: Abnormal   Collection Time   09/10/12  9:16 AM      Component Value Range Comment   Sodium 136  135 - 145 mEq/L    Potassium 3.9  3.5 - 5.1 mEq/L    Chloride 102  96 - 112 mEq/L    CO2 26  19 - 32 mEq/L    Glucose, Bld 118 (*) 70 - 99 mg/dL    BUN 23  6 - 23 mg/dL    Creatinine, Ser 5.28  0.50 - 1.35 mg/dL    Calcium 9.8  8.4 - 41.3 mg/dL    GFR calc non Af Amer 73 (*) >90 mL/min    GFR calc Af Amer 85 (*) >90 mL/min   TROPONIN I     Status: Normal   Collection Time   09/10/12  9:16 AM      Component Value Range Comment   Troponin I <0.30  <0.30 ng/mL    Dg Chest Port 1 View  09/10/2012  *RADIOLOGY REPORT*  Clinical Data: Chest pressure  PORTABLE CHEST - 1 VIEW  Comparison: 08/29/2012  Findings: There is poor inspiration.  Cardiomegaly again noted. Status post CABG.  Mild interstitial prominence bilaterally without convincing pulmonary edema.  Mild basilar atelectasis.  IMPRESSION: Limited study by poor inspiration.  Mild basilar atelectasis.  No convincing pulmonary edema.   Original Report Authenticated By: Natasha Mead, M.D.     ROS: General:No colds or fevers no weight changes Skin:No rashes or ulcers HEENT:No blurred vision KG:MWNUU pressure As described beginning Sunday and has had daily PUL: Shortness of breath with chest pressure but none otherwise GI:No diarrhea constipation or melena no indigestion GU:No hematuria or dysuria MS:no joint pain Neuro:No lightheadedness dizziness or syncope Endo:No diabetes or thyroid disease   Blood pressure 108/70, pulse 61, temperature 98.4 F (36.9 C), temperature source Oral, resp. rate 11, SpO2 100.00%. PE: General:Alert oriented white male in no acute distress pleasant affect Skin:Warm and dry brisk capillary HEENT:Normocephalic sclera  clear Neck:Supple no JVD no carotid bruits Heart:S1-S2 regular rate and rhythm without murmur gallop murmur click Lungs:Clear without rales rhonchi or  wheezes ZOX:[id number] bowel sounds soft nontender Ext:No edema and 2+ pedal pulses bilaterally Neuro:Alert and oriented x3 follows commands moves all extremities    Assessment/Plan Principal Problem:  *Unstable angina, on appropriate medical therapy  Active Problems:  S/P CABG x 3: LIMA TO LAD, SVG TO OM, SVG TO RCA., VG to OM occluded, jeopardized LCX, by cath 08/29/12  CAD (coronary artery disease), severe 4 vessell at cath with 99% LMCA under going CABG 11/2011.  PLAN:  Dr. Allyson Sabal has seen the pt. And we will admit, r/o MI, review cath films and consult with Dr. Laneta Simmers.  IV Heparin and NTG.  Serial cardiac enzymes.  [name],LAURA R 09/10/2012, 10:17 AM    Agree with note written by Nada Boozer RNP  Pt with CAD s/p CABG. Recent cath showed LM occlusion with total SVG to LCX. Now with recurrent [place]. Suspect this is ischemia in LCX distribution. Exam benign. EKG w/o acute changes. Plan admit, iv hep/ntg. ROMI. Will probably require re do CABG. Might want to consider recath tomorrow to better visualize LM. Could do protected LM stent if able to cross.  Runell Gess 09/10/2012 8:14 PM

## 2012-09-10 NOTE — ED Notes (Signed)
Dr. Berry in to see patient 

## 2012-09-10 NOTE — Progress Notes (Signed)
ANTICOAGULATION CONSULT NOTE - Initial Consult  Pharmacy Consult for Heparin Indication: chest pain/ACS  No Known Allergies  Patient Measurements: Height: 5' 9.5" (176.5 cm) Weight: 182 lb 15.7 oz (83 kg) IBW/kg (Calculated) : 71.85  Heparin Dosing Weight: 83 kg  Vital Signs: Temp: 98.4 F (36.9 C) (11/05 0913) Temp src: Oral (11/05 0913) BP: 110/68 mmHg (11/05 1015) Pulse Rate: 60  (11/05 1015)  Labs:  [facility] 09/10/12 0916  HGB 14.6  HCT 40.3  PLT 170  APTT 31  LABPROT 14.8  INR 1.18  HEPARINUNFRC --  CREATININE 1.05  CKTOTAL --  CKMB --  TROPONINI <0.30    Estimated Creatinine Clearance: 72.3 ml/min (by C-G formula based on Cr of 1.05).   Medical History: Past Medical History  Diagnosis Date  . Hypertension   . Hypercholesterolemia   . Anginal pain   . DDD (degenerative disc disease), cervical   . Arthritis     "in my back and neck" (07-Sep-2012)  . Malaria     "I've had it a couple times in the 1960's; almost died from it" (09/07/12)  . PTSD (post-traumatic stress disorder)   . NSTEMI (non-ST elevated myocardial infarction) 11/2011    anterior  . NSTEMI (non-ST elevated myocardial infarction) 2012/09/07  . Coronary artery disease     Assessment: 66 y.o. M with extensive cardiac history including 3V CABG in January 2013 who presents [facility] the [facility] on 11/5 with chest pressure. First set of cardiac enzymes negative. Pharmacy was consulted to start heparin while awaiting further cardiac evaluation. Hep wt~83 kg, Hgb/Hct/Plt wnl. Will start at heparin drip rate shown to be therapeutic during an admission last month.   Goal of Therapy:  Heparin level 0.3-0.7 units/ml Monitor platelets by anticoagulation protocol: Yes   Plan:  1. Heparin bolus of 4000 units x 1 2. Initiate heparin drip at rate of 1300 units/hr  3. Daily heparin levels, CBC 4. Will continue to monitor for any signs/symptoms of bleeding and will follow up with heparin level in 6 hours    Georgina Pillion, PharmD, BCPS Clinical Pharmacist Pager: [phone] 09/10/2012 11:53 AM

## 2012-09-10 NOTE — Consult Note (Signed)
[address]            [name] [address]          [phone]      Reason for Consult:Left main coronary occlusion with occluded vein graft to OM.  Referring Physician:  Dr. Nanetta Batty  CANUTO [name].  HPI:  He had emergent CABG x 3 by me in January 2013 for 99% ostial left main stenosis. He had LIMA to LAD, SVG to OM and SVG to RCA. He was admitted a couple weeks ago with unstable angina and cath showed occlusion of SVG to OM. The left main was occluded at the ostium and OM was supplied by retrograde flow from the LAD which has a high grade stenosis proximally. The LIMA and SVG to RCA are patent. The patient wanted to try medical therapy and was ok walking the halls and sent home. He now is readmitted with chest pressure on and off since Sunday that awoke him from sleep associated with dyspnea. ECG with lateral ST depression. Initial troponin < 0.3.  Past Medical History  Diagnosis Date  . Hypertension   . Hypercholesterolemia   . Anginal pain   . DDD (degenerative disc disease), cervical   . Arthritis     "in my back and neck" (September 20, 2012)  . Malaria     "I've had it a couple times in the 1960's; almost died from it" (2012/09/20)  . PTSD (post-traumatic stress disorder)   . NSTEMI (non-ST elevated myocardial infarction) 11/2011    anterior  . NSTEMI (non-ST elevated myocardial infarction) 2012/09/20  . Coronary artery disease     Past Surgical History  Procedure Date  . Back surgery   . Lumbar disc surgery 1980's?  . Tonsillectomy     "as a kid" (Sep 20, 2012)  . Coronary artery bypass graft 12/02/2011    Procedure: CORONARY ARTERY BYPASS GRAFTING (CABG);  Surgeon: Alleen Borne, MD;  Location: [facility] OR;  Service: Open Heart Surgery;  Laterality: N/A;  . Cardiac catheterization 11/2011  . Cataract extraction w/ intraocular lens  implant, bilateral 1990's    History reviewed. No pertinent family history.  Social  History:  reports that he quit smoking about 9 months ago. His smoking use included Cigarettes. He has a 25 pack-year smoking history. He has never used smokeless tobacco. He reports that he drinks alcohol. He reports that he does not use illicit drugs.  Allergies: No Known Allergies  Medications:  I have reviewed the patient's current medications. Prior to Admission:  Prescriptions prior to admission  Medication Sig Dispense Refill  . amLODipine (NORVASC) 5 MG tablet Take 5 mg by mouth daily.      Marland Kitchen aspirin 81 MG EC tablet Take 81 mg by mouth daily.      . finasteride (PROSCAR) 5 MG tablet Take 5 mg by mouth daily.      . hydrochlorothiazide (HYDRODIURIL) 25 MG tablet Take 12.5 mg by mouth daily.      Marland Kitchen HYDROcodone-acetaminophen (VICODIN) 5-500 MG per tablet Take 1 tablet by mouth every 6 (six) hours as needed. For pain      . isosorbide mononitrate (IMDUR) 60 MG 24 hr tablet Take 60 mg by mouth daily.      . metoprolol (LOPRESSOR) 50 MG tablet Take 50 mg by mouth 2 (two) times daily.      Marland Kitchen  nitroGLYCERIN (NITROSTAT) 0.4 MG SL tablet Place 0.4 mg under the tongue every 5 (five) minutes x 3 doses as needed.      Marland Kitchen omeprazole (PRILOSEC) 20 MG capsule Take 20 mg by mouth 2 (two) times daily.      . ranolazine (RANEXA) 500 MG 12 hr tablet Take 500 mg by mouth 2 (two) times daily.      . rosuvastatin (CRESTOR) 10 MG tablet Take 20 mg by mouth daily at 6 PM.      . terazosin (HYTRIN) 5 MG capsule Take 5 mg by mouth at bedtime.      . Ticagrelor (BRILINTA) 90 MG TABS tablet Take 90 mg by mouth 2 (two) times daily.      . hydrOXYzine (ATARAX/VISTARIL) 25 MG tablet Take 50 mg by mouth 3 (three) times daily as needed. For allergies.       Scheduled:   . amLODipine  5 mg Oral Daily  . [COMPLETED] aspirin  324 mg Oral Once  . aspirin EC  81 mg Oral Daily  . atorvastatin  80 mg Oral q1800  . finasteride  5 mg Oral Daily  . [COMPLETED] heparin  4,000 Units Intravenous Once  . hydrochlorothiazide   12.5 mg Oral Daily  . metoprolol  50 mg Oral BID  . pantoprazole  80 mg Oral Daily  . ranolazine  500 mg Oral BID  . terazosin  5 mg Oral QHS  . Ticagrelor  90 mg Oral BID  . [DISCONTINUED] aspirin  81 mg Oral Daily  . [DISCONTINUED] aspirin  81 mg Oral Daily   Continuous:   . sodium chloride 20 mL/hr at 09/10/12 1156  . heparin 1,300 Units/hr (09/10/12 1232)  . nitroGLYCERIN 5 mcg/min (09/10/12 1156)   ZOX:[id number], ALPRAZolam, HYDROcodone-acetaminophen, nitroGLYCERIN, ondansetron (ZOFRAN) IV, zolpidem  Results for orders placed during the hospital encounter of 09/10/12 (from the past 48 hour(s))  CBC WITH DIFFERENTIAL     Status: Abnormal   Collection Time   09/10/12  9:16 AM      Component Value Range Comment   WBC 11.4 (*) 4.0 - 10.5 K/uL    RBC 4.65  4.22 - 5.81 MIL/uL    Hemoglobin 14.6  13.0 - 17.0 g/dL    HCT 14.7  82.9 - 56.2 %    MCV 86.7  78.0 - 100.0 fL    MCH 31.4  26.0 - 34.0 pg    MCHC 36.2 (*) 30.0 - 36.0 g/dL    RDW 13.0  86.5 - 78.4 %    Platelets 170  150 - 400 K/uL    Neutrophils Relative 74  43 - 77 %    Neutro Abs 8.4 (*) 1.7 - 7.7 K/uL    Lymphocytes Relative 18  12 - 46 %    Lymphs Abs 2.1  0.7 - 4.0 K/uL    Monocytes Relative 7  3 - 12 %    Monocytes Absolute 0.8  0.1 - 1.0 K/uL    Eosinophils Relative 0  0 - 5 %    Eosinophils Absolute 0.0  0.0 - 0.7 K/uL    Basophils Relative 0  0 - 1 %    Basophils Absolute 0.0  0.0 - 0.1 K/uL   BASIC METABOLIC PANEL     Status: Abnormal   Collection Time   09/10/12  9:16 AM      Component Value Range Comment   Sodium 136  135 - 145 mEq/L    Potassium 3.9  3.5 -  5.1 mEq/L    Chloride 102  96 - 112 mEq/L    CO2 26  19 - 32 mEq/L    Glucose, Bld 118 (*) 70 - 99 mg/dL    BUN 23  6 - 23 mg/dL    Creatinine, Ser 5.62  0.50 - 1.35 mg/dL    Calcium 9.8  8.4 - 13.0 mg/dL    GFR calc non Af Amer 73 (*) >90 mL/min    GFR calc Af Amer 85 (*) >90 mL/min   TROPONIN I     Status: Normal   Collection Time    09/10/12  9:16 AM      Component Value Range Comment   Troponin I <0.30  <0.30 ng/mL   APTT     Status: Normal   Collection Time   09/10/12  9:16 AM      Component Value Range Comment   aPTT 31  24 - 37 seconds   PROTIME-INR     Status: Normal   Collection Time   09/10/12  9:16 AM      Component Value Range Comment   Prothrombin Time 14.8  11.6 - 15.2 seconds    INR 1.18  0.00 - 1.49   TROPONIN I     Status: Normal   Collection Time   09/10/12 11:20 AM      Component Value Range Comment   Troponin I <0.30  <0.30 ng/mL   PRO B NATRIURETIC PEPTIDE     Status: Abnormal   Collection Time   09/10/12 11:20 AM      Component Value Range Comment   Pro B Natriuretic peptide (BNP) 1987.0 (*) 0 - 125 pg/mL   CK TOTAL AND CKMB     Status: Normal   Collection Time   09/10/12 11:20 AM      Component Value Range Comment   Total CK 35  7 - 232 U/L    CK, MB 2.1  0.3 - 4.0 ng/mL    Relative Index RELATIVE INDEX IS INVALID  0.0 - 2.5   PROTIME-INR     Status: Normal   Collection Time   09/10/12 11:21 AM      Component Value Range Comment   Prothrombin Time 14.1  11.6 - 15.2 seconds    INR 1.10  0.00 - 1.49   APTT     Status: Normal   Collection Time   09/10/12 11:21 AM      Component Value Range Comment   aPTT 30  24 - 37 seconds   MAGNESIUM     Status: Normal   Collection Time   09/10/12 11:21 AM      Component Value Range Comment   Magnesium 1.9  1.5 - 2.5 mg/dL     Dg Chest Port 1 View  09/10/2012  *RADIOLOGY REPORT*  Clinical Data: Chest pressure  PORTABLE CHEST - 1 VIEW  Comparison: 08/29/2012  Findings: There is poor inspiration.  Cardiomegaly again noted. Status post CABG.  Mild interstitial prominence bilaterally without convincing pulmonary edema.  Mild basilar atelectasis.  IMPRESSION: Limited study by poor inspiration.  Mild basilar atelectasis.  No convincing pulmonary edema.   Original Report Authenticated By: Natasha Mead, M.D.     Review of Systems  Constitutional: Positive for  malaise/fatigue. Negative for fever, chills, weight loss and diaphoresis.  HENT: Negative.   Eyes: Negative.   Respiratory: Positive for shortness of breath.   Cardiovascular: Positive for chest pain. Negative for orthopnea and PND.  Gastrointestinal: Negative.  Genitourinary: Negative.   Musculoskeletal: Negative.   Skin: Negative.   Neurological: Negative.   Endo/Heme/Allergies: Negative.   Psychiatric/Behavioral: Negative.    Blood pressure 104/60, pulse 56, temperature 98.3 F (36.8 C), temperature source Oral, resp. rate 19, height 5\' 9"  (1.753 m), weight 83.1 kg (183 lb 3.2 oz), SpO2 100.00%. Physical Exam  Constitutional: He is oriented to person, place, and time. He appears well-developed and well-nourished. No distress.  HENT:  Head: Normocephalic and atraumatic.  Eyes: Conjunctivae normal and EOM are normal. Pupils are equal, round, and reactive to light.  Neck: Normal range of motion. Neck supple. No JVD present. No thyromegaly present.  Cardiovascular: Normal rate, regular rhythm, normal heart sounds and intact distal pulses.  Exam reveals no gallop and no friction rub.   No murmur heard. Respiratory: Effort normal and breath sounds normal.  GI: Soft. Bowel sounds are normal. He exhibits no distension and no mass. There is no tenderness.  Musculoskeletal: Normal range of motion. He exhibits no edema.  Neurological: He is alert and oriented to person, place, and time. No cranial nerve deficit.  Skin: Skin is warm and dry.  Psychiatric: He has a normal mood and affect.    CARDIAC CATH:  HEMODYNAMIC DATA: Central aortic pressure was 125/63. Left ventricular  pressure 125/60.  ANGIOGRAPHIC DATA: Left main coronary artery was totally occluded at  its origin.  The right coronary artery was moderate-sized vessel. A flush and fill  phenomena was seen in the branch and arising from the acute margin. The  distal RCA ended in a PLA-like vessel branch.  The left internal  mammary artery vessel was tortuous and free of  significant disease. This anastomosed into the mid LAD just proximal to  bifurcating into a diagonal vessel. The LAD was then a twin like  vessel, which did not reach the apex. Between the LIMA insertion and  the proximal septal perforating artery, there was at least 80% LAD  stenosis. There was then filling of the LAD back into the left main  where the left main was totally occluded. The entire circumflex vessel  filled via this left injection and appeared to be without significant  obstruction. The circumflex vessel gave rise to a bifurcating marginal  vessel.  The vein graft which had supplied the circumflex vessel was occluded at  its origin.  The vein graft which had supplied the distal RCA seem to fill into this  branch arising from the acute margin, which was small caliber and gave  rise to several additional small branches and was free of significant  disease.  RAO ventriculography revealed an ejection fraction of 50% to 55%. There  was mild mid inferior and upper anterolateral hypocontractility.  Aortic root injection did not demonstrate any aortic insufficiency.  Only 1 vein graft was visualized, which was the vein graft supplying the  right coronary artery. There was no graft visualized, which supplied  the circumflex system verifying that this graft was occluded.  IMPRESSION:  1. Severe native coronary artery disease with total occlusion of the  left main at its origin and evidence for mild irregularity of the  native right coronary artery with narrowings of 30% to 40%  throughout and evidence for flush and fill phenomena seen in a  branch arising from the acute margin.  2. Patent left internal mammary artery graft, which supplies the mid  LAD, but there is evidence for 80% stenosis in the LAD proximal to  the insertion between the LIMA insertion  and the septal with  evidence for filling of the entire left coronary circulation   including the entire left circumflex system through this, LIMA  graft.  3. Occluded vein graft, which had supplied the left circumflex vessel.  4. Patent vein graft supplying the right coronary artery.  5. Low-normal left ventricular dysfunction with mild mid inferior and  upper anterolateral hypocontractility with ejection fraction of 50%  to 55%.  RECOMMENDATION: Increase medical therapy. It appears that the vein  graft, which had supplied the circumflex vessel is now occluded most  likely due to the filling of the circumflex via the LIMA graft, which  supplied the entire LAD into the left main and down the circumflex.  However, there is some potential for ischemia since the 80% stenosis in  the LAD is proximal to the LIMA insertion. The LIMA itself is very  tortuous and the left main is occluded at its origin. We will restart  anti-platelet therapy and aggressively increase medical therapy.  ______________________________  Nicki Guadalajara, M.D.   Assessment/Plan:  He has a large left circumflex system supplied by retrograde flow through the LIMA to LAD with high grade proximal LAD stenosis compromising this flow. He has failed trial of medical therapy and needs redo CABG to OM. I discussed the operative procedure with the patient and family including alternatives, benefits and risks; including but not limited to bleeding, blood transfusion, infection, stroke, myocardial infarction, graft failure, heart block requiring a permanent pacemaker, organ dysfunction, and death.  [name] understands and agrees to proceed.  We will schedule surgery for Thursday.  Alleen Borne 09/10/2012, 6:19 PM

## 2012-09-10 NOTE — ED Notes (Signed)
Friend at bedside. Pt cont to deny any pain

## 2012-09-11 ENCOUNTER — Encounter (HOSPITAL_COMMUNITY)
Admission: EM | Disposition: A | Discharge status: Home / Self Care | Payer: Self-pay | Source: Home / Self Care | Attending: Cardiovascular Disease

## 2012-09-11 HISTORY — PX: LEFT HEART CATHETERIZATION WITH CORONARY ANGIOGRAM: SHX5451

## 2012-09-11 LAB — BASIC METABOLIC PANEL
BUN: 20 mg/dL (ref 6–23)
Calcium: 8.8 mg/dL (ref 8.4–10.5)
Creatinine, Ser: 0.89 mg/dL (ref 0.50–1.35)
GFR calc Af Amer: >90 mL/min (ref >90)
GFR calc non Af Amer: 88 mL/min — ABNORMAL LOW (ref >90)
Glucose, Bld: 103 mg/dL — ABNORMAL HIGH (ref 70–99)

## 2012-09-11 LAB — CBC
HCT: 39.1 % (ref 39.0–52.0)
Hemoglobin: 13.4 g/dL (ref 13.0–17.0)
MCH: 30.5 pg (ref 26.0–34.0)
MCHC: 34.3 g/dL (ref 30.0–36.0)
MCV: 88.9 fL (ref 78.0–100.0)
RDW: 14.5 % (ref 11.5–15.5)

## 2012-09-11 LAB — LIPID PANEL
LDL Cholesterol: 48 mg/dL (ref 0–99)
Triglycerides: 104 mg/dL (ref <150)
VLDL: 21 mg/dL (ref 0–40)

## 2012-09-11 SURGERY — LEFT HEART CATHETERIZATION WITH CORONARY ANGIOGRAM
Anesthesia: LOCAL

## 2012-09-11 MED ORDER — HEPARIN (PORCINE) IN NACL 2-0.9 UNIT/ML-% IJ SOLN
INTRAMUSCULAR | Status: AC
  Filled 2012-09-11: qty 1000

## 2012-09-11 MED ORDER — SODIUM CHLORIDE 0.9 % IV SOLN
1.0000 mL/kg/h | INTRAVENOUS | Status: DC
  Administered 2012-09-11: 1 mL/kg/h via INTRAVENOUS

## 2012-09-11 MED ORDER — GUAIFENESIN ER 600 MG PO TB12
600.0000 mg | ORAL_TABLET | Freq: Two times a day (BID) | ORAL | Status: DC
  Administered 2012-09-11: 600 mg via ORAL
  Filled 2012-09-11 (×4): qty 1

## 2012-09-11 MED ORDER — ASPIRIN EC 81 MG PO TBEC
81.0000 mg | DELAYED_RELEASE_TABLET | Freq: Every day | ORAL | Status: DC
Start: 2012-09-11 — End: 2012-09-19
  Administered 2012-09-12 – 2012-09-18 (×7): 81 mg via ORAL
  Filled 2012-09-11 (×9): qty 1

## 2012-09-11 MED ORDER — LIDOCAINE HCL (PF) 1 % IJ SOLN
INTRAMUSCULAR | Status: AC
  Filled 2012-09-11: qty 30

## 2012-09-11 MED ORDER — SODIUM CHLORIDE 0.9 % IJ SOLN: 3.0000 mL | INTRAMUSCULAR | Status: DC | PRN

## 2012-09-11 MED ORDER — FENTANYL CITRATE 0.05 MG/ML IJ SOLN
INTRAMUSCULAR | Status: AC
  Filled 2012-09-11: qty 2

## 2012-09-11 MED ORDER — SODIUM CHLORIDE 0.9 % IJ SOLN
3.0000 mL | Freq: Two times a day (BID) | INTRAMUSCULAR | Status: DC
  Administered 2012-09-11: 3 mL via INTRAVENOUS

## 2012-09-11 MED ORDER — ACETAMINOPHEN 325 MG PO TABS: 650.0000 mg | ORAL_TABLET | ORAL | Status: DC | PRN

## 2012-09-11 MED ORDER — ASPIRIN 81 MG PO CHEW: 324.0000 mg | CHEWABLE_TABLET | ORAL | Status: DC

## 2012-09-11 MED ORDER — NITROGLYCERIN 0.2 MG/ML ON CALL CATH LAB
INTRAVENOUS | Status: AC
  Filled 2012-09-11: qty 1

## 2012-09-11 MED ORDER — ONDANSETRON HCL 4 MG/2ML IJ SOLN: 4.0000 mg | Freq: Four times a day (QID) | INTRAMUSCULAR | Status: DC | PRN

## 2012-09-11 MED ORDER — SODIUM CHLORIDE 0.9 % IV SOLN: 250.0000 mL | INTRAVENOUS | Status: DC | PRN

## 2012-09-11 MED ORDER — RANOLAZINE ER 500 MG PO TB12
1000.0000 mg | ORAL_TABLET | Freq: Two times a day (BID) | ORAL | Status: DC
  Administered 2012-09-11 – 2012-09-18 (×15): 1000 mg via ORAL
  Filled 2012-09-11 (×18): qty 2

## 2012-09-11 MED ORDER — SODIUM CHLORIDE 0.9 % IV SOLN
INTRAVENOUS | Status: DC
  Administered 2012-09-11: 18:00:00 via INTRAVENOUS

## 2012-09-11 MED ORDER — MIDAZOLAM HCL 2 MG/2ML IJ SOLN
INTRAMUSCULAR | Status: AC
  Filled 2012-09-11: qty 2

## 2012-09-11 MED ORDER — HEPARIN (PORCINE) IN NACL 100-0.45 UNIT/ML-% IJ SOLN
1300.0000 [IU]/h | INTRAMUSCULAR | Status: DC
  Administered 2012-09-12 – 2012-09-16 (×5): 1300 [IU]/h via INTRAVENOUS
  Filled 2012-09-11 (×15): qty 250

## 2012-09-11 NOTE — CV Procedure (Signed)
Relook catheterization of LM:  [name], 66 y.o., male  Full note dictated  DICTATION # [phone], [id number]  LM remains flushly occluded and not visualized from central arta. Therefore, attempt at  PCI for LM stent was not able to be performed.  Successful Mynx closure 6FR of Right FA.  Last dose of brilinta was yesterday. Will resume heparin later today. Plan elective CABG after 5 days of brilinta washout with Dr. Laneta Simmers early next week.  Darrell Bihari, MD, [facility] 09/11/2012 4:19 PM

## 2012-09-11 NOTE — Progress Notes (Signed)
Nursing Note - Patient with strong non-productive cough and "stuffy" nose - requesting "alka-seltzer" for cold.  Will defer to rounding physician.  VSS - denies pain or distress.

## 2012-09-11 NOTE — Progress Notes (Signed)
[facility] Progress Note  Subjective:  No further chest pain since admission.  Objective:   Vital Signs in the last 24 hours: Temp:  [98 F (36.7 C)-98.6 F (37 C)] 98.5 F (36.9 C) (11/06 0732) Pulse Rate:  [53-80] 70  (11/06 0800) Resp:  [9-23] 20  (11/06 0800) BP: (86-132)/(41-76) 124/70 mmHg (11/06 0800) SpO2:  [97 %-100 %] 97 % (11/06 0800) Weight:  [83 kg (182 lb 15.7 oz)-83.1 kg (183 lb 3.2 oz)] 83.1 kg (183 lb 3.2 oz) (11/05 1500)  Intake/Output from previous day: 11/05 0701 - 11/06 0700 In: 635.5 [P.O.:360; I.V.:275.5] Out: 1100 [Urine:1100]  Scheduled:   . amLODipine  5 mg Oral Daily  . [COMPLETED] aspirin  324 mg Oral Once  . aspirin EC  81 mg Oral Daily  . atorvastatin  80 mg Oral q1800  . finasteride  5 mg Oral Daily  . [COMPLETED] heparin  4,000 Units Intravenous Once  . hydrochlorothiazide  12.5 mg Oral Daily  . metoprolol  50 mg Oral BID  . pantoprazole  80 mg Oral Daily  . ranolazine  500 mg Oral BID  . terazosin  5 mg Oral QHS  . Ticagrelor  90 mg Oral BID  . [DISCONTINUED] aspirin  81 mg Oral Daily  . [DISCONTINUED] aspirin  81 mg Oral Daily    Physical Exam:   General appearance: alert, cooperative and no distress Neck: no carotid bruit, no JVD, supple, symmetrical, trachea midline and thyroid not enlarged, symmetric, no tenderness/mass/nodules Lungs: clear to auscultation bilaterally Heart: S1, S2 normal and 1/6 sem Abdomen: soft, non-tender; bowel sounds normal; no masses,  no organomegaly Extremities: no edema, redness or tenderness in the calves or thighs   Rate:     70  Rhythm: normal sinus rhythm  Lab Results:    Basename 09/11/12 0448 09/10/12 0916  NA 139 136  K 3.7 3.9  CL 105 102  CO2 25 26  GLUCOSE 103* 118*  BUN 20 23  CREATININE 0.89 1.05    Basename 09/10/12 2329 09/10/12 1738  TROPONINI <0.30 <0.30   Hepatic Function Panel No results found for this basename:  PROT,ALBUMIN,AST,ALT,ALKPHOS,BILITOT,BILIDIR,IBILI in the last 72 hours  Basename 09/10/12 1121  INR 1.10    Lipid Panel     Component Value Date/Time   CHOL 102 09/11/2012 0448   TRIG 104 09/11/2012 0448   HDL 33* 09/11/2012 0448   CHOLHDL 3.1 09/11/2012 0448   VLDL 21 09/11/2012 0448   LDLCALC 48 09/11/2012 0448     Imaging:  Dg Chest Port 1 View  09/10/2012  *RADIOLOGY REPORT*  Clinical Data: Chest pressure  PORTABLE CHEST - 1 VIEW  Comparison: 08/29/2012  Findings: There is poor inspiration.  Cardiomegaly again noted. Status post CABG.  Mild interstitial prominence bilaterally without convincing pulmonary edema.  Mild basilar atelectasis.  IMPRESSION: Limited study by poor inspiration.  Mild basilar atelectasis.  No convincing pulmonary edema.   Original Report Authenticated By: Natasha Mead, M.D.       Assessment/Plan:   Principal Problem:  *Unstable angina, on appropriate medical therapy  Active Problems:  S/P CABG x 3: LIMA TO LAD, SVG TO OM, SVG TO RCA., VG to OM occluded, jeopardized LCX, by cath 08/29/12  CAD (coronary artery disease), severe 4 vessell at cath with 99% LMCA under going CABG 11/2011.  HTN (hypertension)   Long discussion with patient. Pt well known to me from last admission.  He has failed medical therapy trial. Patient  was started on brilinta at last hospitalization; his last dose was yesterday; therefore, if CABG to be done needs to be deferred for 5 days post brilinta to reduce bleeding risk. Discussed with Dr. Allyson Sabal and patient.  Also now that patient has been on antiplatelet and heparin therapy ? If minimal LM flow exists to possible perform a protected LM stent down LCX vessel since LIMA graft is patent to LAD. I discussed with patient who agrees to this relook of LM to see if this is possible and if not then CABG early next week.  Will keep NPO for possible relook later today (ate breakfast).   Lennette Bihari, MD, [facility] 09/11/2012, 8:33 AM

## 2012-09-11 NOTE — Progress Notes (Addendum)
ANTICOAGULATION CONSULT NOTE - Follow Up Consult  Pharmacy Consult for Heparin Indication: chest pain/ACS  No Known Allergies  Patient Measurements: Height: 5\' 9"  (175.3 cm) Weight: 183 lb 3.2 oz (83.1 kg) IBW/kg (Calculated) : 70.7  Heparin Dosing Weight: 83kg  Vital Signs: Temp: 98.5 F (36.9 C) (11/06 0732) Temp src: Oral (11/06 0732) BP: 124/70 mmHg (11/06 0800) Pulse Rate: 70  (11/06 0800)  Labs:  Basename 09/11/12 0448 09/10/12 2329 09/10/12 2008 09/10/12 1738 09/10/12 1121 09/10/12 1120 09/10/12 0916  HGB 13.4 -- -- -- -- -- 14.6  HCT 39.1 -- -- -- -- -- 40.3  PLT 160 -- -- -- -- -- 170  APTT -- -- -- -- 30 -- 31  LABPROT -- -- -- -- 14.1 -- 14.8  INR -- -- -- -- 1.10 -- 1.18  HEPARINUNFRC 0.40 -- 0.55 -- -- -- --  CREATININE 0.89 -- -- -- -- -- 1.05  CKTOTAL -- -- -- -- -- 35 --  CKMB -- -- -- -- -- 2.1 --  TROPONINI -- <0.30 -- <0.30 -- <0.30 --    Estimated Creatinine Clearance: 83.9 ml/min (by C-G formula based on Cr of 0.89).   Medications:  Heparin @ 1300 units/hr  Assessment: 65yom continues on heparin pending re-do CABG to OM. Was planned for 11/7 but patient took brilinta yesterday so need to wait to do surgery to reduce bleeding risk. He will actually go to the cath lab later today to see if they can do a protected LM stent down his LCx vessel. If this is not possible, he will get re-do CABG early next week.   Heparin level is therapeutic. CBC stable. No bleeding noted.  Goal of Therapy:  Heparin level 0.3-0.7 units/ml Monitor platelets by anticoagulation protocol: Yes   Plan:  1) Continue heparin at 1300 units/hr 2) Follow up after cath today  Fredrik Rigger 09/11/2012,9:02 AM

## 2012-09-11 NOTE — Progress Notes (Signed)
ANTICOAGULATION CONSULT NOTE - Follow Up Consult  Pharmacy Consult for Heparin Indication: chest pain/ACS  No Known Allergies  Patient Measurements: Height: 5\' 9"  (175.3 cm) Weight: 183 lb 3.2 oz (83.1 kg) IBW/kg (Calculated) : 70.7  Heparin Dosing Weight: 83kg  Vital Signs: Temp: 98.6 F (37 C) (11/06 1741) Temp src: Oral (11/06 1741) BP: 116/63 mmHg (11/06 1800) Pulse Rate: 59  (11/06 1800)  Labs:  Basename 09/11/12 0448 09/10/12 2329 09/10/12 2008 09/10/12 1738 09/10/12 1121 09/10/12 1120 09/10/12 0916  HGB 13.4 -- -- -- -- -- 14.6  HCT 39.1 -- -- -- -- -- 40.3  PLT 160 -- -- -- -- -- 170  APTT -- -- -- -- 30 -- 31  LABPROT -- -- -- -- 14.1 -- 14.8  INR -- -- -- -- 1.10 -- 1.18  HEPARINUNFRC 0.40 -- 0.55 -- -- -- --  CREATININE 0.89 -- -- -- -- -- 1.05  CKTOTAL -- -- -- -- -- 35 --  CKMB -- -- -- -- -- 2.1 --  TROPONINI -- <0.30 -- <0.30 -- <0.30 --    Estimated Creatinine Clearance: 83.9 ml/min (by C-G formula based on Cr of 0.89).  Assessment: 65yom s/p cath to resume heparin 6 hour post sheath removal with no bolus. Noted that stenting of LM was not able to be performed and patient to go for CABG next week.   Patient was therapeutic on heparin at 1300 units/hr prior to cath.   Goal of Therapy:  Heparin level 0.3-0.7 units/ml Monitor platelets by anticoagulation protocol: Yes   Plan:  1) Restart heparin at 1300 units/hr at 2200 tonight  2) Follow up with 6 hour heparin level (can be drawn with AM labs)   Thank you,  Brett Fairy, PharmD 09/11/2012 7:07 PM

## 2012-09-12 ENCOUNTER — Encounter (HOSPITAL_COMMUNITY)
Admission: EM | Disposition: A | Discharge status: Home / Self Care | Payer: Self-pay | Source: Home / Self Care | Attending: Cardiovascular Disease

## 2012-09-12 LAB — CBC
HCT: 38 % — ABNORMAL LOW (ref 39.0–52.0)
MCH: 29.8 pg (ref 26.0–34.0)
MCHC: 33.7 g/dL (ref 30.0–36.0)
MCV: 88.4 fL (ref 78.0–100.0)
Platelets: 161 10*3/uL (ref 150–400)
RDW: 14.7 % (ref 11.5–15.5)

## 2012-09-12 LAB — HEPARIN LEVEL (UNFRACTIONATED): Heparin Unfractionated: 0.35 IU/mL (ref 0.30–0.70)

## 2012-09-12 SURGERY — REDO CORONARY ARTERY BYPASS GRAFTING (CABG)
Anesthesia: General | Site: Chest

## 2012-09-12 MED ORDER — FLUTICASONE PROPIONATE 50 MCG/ACT NA SUSP
1.0000 | Freq: Every day | NASAL | Status: DC
  Administered 2012-09-12 – 2012-09-18 (×7): 1 via NASAL
  Filled 2012-09-12 (×3): qty 16

## 2012-09-12 MED ORDER — GUAIFENESIN ER 600 MG PO TB12
1200.0000 mg | ORAL_TABLET | Freq: Two times a day (BID) | ORAL | Status: DC
  Administered 2012-09-12 – 2012-09-17 (×7): 1200 mg via ORAL
  Filled 2012-09-12 (×15): qty 2

## 2012-09-12 MED ORDER — ISOSORBIDE MONONITRATE ER 30 MG PO TB24
30.0000 mg | ORAL_TABLET | Freq: Every day | ORAL | Status: DC
  Administered 2012-09-12 – 2012-09-18 (×7): 30 mg via ORAL
  Filled 2012-09-12 (×8): qty 1

## 2012-09-12 MED ORDER — BUTALBITAL-APAP-CAFFEINE 50-325-40 MG PO TABS
2.0000 | ORAL_TABLET | Freq: Four times a day (QID) | ORAL | Status: DC | PRN
  Administered 2012-09-12: 2 via ORAL
  Filled 2012-09-12: qty 2

## 2012-09-12 MED ORDER — LORATADINE 10 MG PO TABS
10.0000 mg | ORAL_TABLET | Freq: Every day | ORAL | Status: DC
  Administered 2012-09-12 – 2012-09-20 (×7): 10 mg via ORAL
  Filled 2012-09-12 (×12): qty 1

## 2012-09-12 NOTE — Progress Notes (Signed)
Pt complained of congestion and current mucinex is not helping.  Also with H/A.  Meds adjusted.

## 2012-09-12 NOTE — Progress Notes (Signed)
[facility] Progress Note  Subjective:  No further chest pain.  Last dose of brilinta 09/10/12.  Objective:   Vital Signs in the last 24 hours: Temp:  [97.6 F (36.4 C)-98.7 F (37.1 C)] 97.7 F (36.5 C) (11/07 0400) Pulse Rate:  [57-189] 57  (11/07 0400) Resp:  [12-27] 19  (11/07 0400) BP: (83-128)/(46-69) 83/55 mmHg (11/07 0400) SpO2:  [98 %-100 %] 99 % (11/07 0400)  Intake/Output from previous day: 11/06 0701 - 11/07 0700 In: 2188.3 [P.O.:440; I.V.:1748.3] Out: 800 [Urine:800]  Scheduled:   . amLODipine  5 mg Oral Daily  . aspirin EC  81 mg Oral Daily  . atorvastatin  80 mg Oral q1800  . [COMPLETED] fentaNYL      . finasteride  5 mg Oral Daily  . guaiFENesin  600 mg Oral BID  . [COMPLETED] heparin      . hydrochlorothiazide  12.5 mg Oral Daily  . [COMPLETED] lidocaine      . metoprolol  50 mg Oral BID  . [COMPLETED] midazolam      . [COMPLETED] nitroGLYCERIN      . pantoprazole  80 mg Oral Daily  . ranolazine  1,000 mg Oral BID  . terazosin  5 mg Oral QHS  . [DISCONTINUED] aspirin  324 mg Oral Pre-Cath  . [DISCONTINUED] aspirin EC  81 mg Oral Daily  . [DISCONTINUED] ranolazine  500 mg Oral BID  . [DISCONTINUED] sodium chloride  3 mL Intravenous Q12H  . [DISCONTINUED] Ticagrelor  90 mg Oral BID    Physical Exam:   General appearance: alert, cooperative and no distress Neck: no JVD and supple, symmetrical, trachea midline Lungs: clear to auscultation bilaterally Heart: S1, S2 normal and 1/6 sem Abdomen: soft, non-tender; bowel sounds normal; no masses,  no organomegaly Extremities: no edema, redness or tenderness in the calves or thighs   Rate:58  Rhythm: sinus bradycardia  Lab Results:    Basename 09/11/12 0448 09/10/12 0916  NA 139 136  K 3.7 3.9  CL 105 102  CO2 25 26  GLUCOSE 103* 118*  BUN 20 23  CREATININE 0.89 1.05    Basename 09/10/12 2329 09/10/12 1738  TROPONINI <0.30 <0.30   Hepatic Function Panel No  results found for this basename: PROT,ALBUMIN,AST,ALT,ALKPHOS,BILITOT,BILIDIR,IBILI in the last 72 hours  Basename 09/10/12 1121  INR 1.10   Cardiac Panel (last 3 results)  Basename 09/10/12 2329 09/10/12 1738 09/10/12 1120  CKTOTAL -- -- 35  CKMB -- -- 2.1  TROPONINI <0.30 <0.30 <0.30  RELINDX -- -- RELATIVE INDEX IS INVALID    Lipid Panel     Component Value Date/Time   CHOL 102 09/11/2012 0448   TRIG 104 09/11/2012 0448   HDL 33* 09/11/2012 0448   CHOLHDL 3.1 09/11/2012 0448   VLDL 21 09/11/2012 0448   LDLCALC 48 09/11/2012 0448     Imaging:  Dg Chest Port 1 View  09/10/2012  *RADIOLOGY REPORT*  Clinical Data: Chest pressure  PORTABLE CHEST - 1 VIEW  Comparison: 08/29/2012  Findings: There is poor inspiration.  Cardiomegaly again noted. Status post CABG.  Mild interstitial prominence bilaterally without convincing pulmonary edema.  Mild basilar atelectasis.  IMPRESSION: Limited study by poor inspiration.  Mild basilar atelectasis.  No convincing pulmonary edema.   Original Report Authenticated By: Natasha Mead, M.D.       Assessment/Plan:   Principal Problem:  *Unstable angina, on appropriate medical therapy  Active Problems:  S/P CABG x 3: LIMA TO LAD,  SVG TO OM, SVG TO RCA., VG to OM occluded, jeopardized LCX, by cath 08/29/12  CAD (coronary artery disease), severe 4 vessell at cath with 99% LMCA under going CABG 11/2011.  HTN (hypertension)  Feels well. Flush LM occlusion still noted on repeat cath yesterday;therefore, attempted LM PCI not able to be performed. 5 day Brilinta washout for CABG. Will change to oral nitrates and dc IV NTG. Tolerating increased meds without recurrent symptoms on heparin.  Transfer to 2000 awaiting CABG.   Lennette Bihari, MD, [facility] 09/12/2012, 8:25 AM

## 2012-09-12 NOTE — Cardiovascular Report (Signed)
[name], ZOLL NO.:  [id number]  MEDICAL RECORD NO.:  [id number]  LOCATION:  2013                         FACILITY:  [facility]  PHYSICIAN:  Nicki Guadalajara, M.D.     DATE OF BIRTH:  1945-12-26  DATE OF PROCEDURE: DATE OF DISCHARGE:                           CARDIAC CATHETERIZATION   Relook catheterization for possible attempted PCI.  INDICATIONS:  The patient is [name].  Acute catheterization revealed a subtotal ostial left main stenosis in addition to proximal to mid LAD stenosis and moderate RCA stenosis.  He underwent emergent CABG surgery by Dr. Laneta Simmers and had a LIMA placed to his LAD, a vein to a circumflex, and a vein to his right coronary artery.  The patient had done well, but recently was readmitted on August 29, 2012, with chest pain.  Repeat catheterization was done by me on August 30, 2012.  This showed a flush occlusion of his left main without real visualization of the left main arising from the aorta.  He had a patent LIMA to the LAD which anastomosed into the mid LAD. However, the proximal to mid LAD had diffuse 80% to 90% stenosis.  The LAD then extended back into the left main and then supplied the entire circumflex territory which potentially was in jeopardy.  He had scattered RCA native disease.  He had a patent vein graft to the right coronary artery, a patent LIMA graft to the LAD with a vein graft which had supplied the marginal vessel was occluded.  During that hospitalization, the patient was treated with increasing medical therapy.  He also was seen by Dr. Laneta Simmers in light of potential need for redo CABG surgery.  The patient opted for an initial attempt at increased medical therapy.  He therefore was started on dual anti- platelet therapy with Brilinta in addition to his aspirin, beta-blocker, nitrates, and Ranexa.  He had done well until  yesterday when he was readmitted with recurrent chest pain suggestive of unstable angina.  The patient was again seen by Dr. Laneta Simmers in consult with plans for probable need for redo CABG since he failed medical therapy.  However, it was felt by Dr. Allyson Sabal and with further discussion that perhaps relook at the left main would be in order to see if in fact, there was now a nubbin and potentially if so, the left main can be opened to assess the LAD. Since the left main was protected with an intact LIMA to the LAD and the potential for left main stenting into the circumflex system could be performed to obviate the need for surgery.  For this reason, the patient is now brought back to the catheterization laboratory to relook at the left main and to see if this is potentially amenable for left main stenting.  PROCEDURE:  The patient was prepped and draped in usual fashion.  His right femoral artery was punctured anteriorly and a 6-French sheath was inserted.  He was premedicated with 2 mg of Versed plus 50 mcg of fentanyl.  At the time of the diagnostic catheterization 2 weeks ago, an [place]  catheter was used.  At this time, 6-French XB LAD 3.5 catheter was then used as well as an XB 4.0 LAD catheter.  Multiple injections were made into the aortic root, sinus of Valsalva, in the whole where the left main showed arise, but again there was no visualization of any nubbin arising from the aorta suggestive of again continued flush occlusion of the left main.  For this reason, it was felt that an attempted intervention could not be performed.  A Mynx closure device 6- [place] was then used for excellent hemostasis.  The patient left the catheterization laboratory with stable hemodynamics and stable condition.  Aortic pressure was 110/70.  Left main coronary artery was completely occluded and had a flush occlusion and now with the aorta such that there was no nubbin visualized and PCI was unable to  therefore be performed.  The patient left the catheterization laboratory in stable condition.  The hemostasis was excellent with the Mynx closure device.  He will be started on heparin again later today. The last dose of Brilinta was yesterday and this will need to be held for five days prior to elective CABG revascularization surgery.          ______________________________ Nicki Guadalajara, M.D.     TK/MEDQ  D:  09/11/2012  T:  09/12/2012  Job:  [id number]

## 2012-09-12 NOTE — Progress Notes (Signed)
ANTICOAGULATION CONSULT NOTE - Follow Up Consult  Pharmacy Consult for Heparin Indication: chest pain/ACS  No Known Allergies  Patient Measurements: Height: 5\' 9"  (175.3 cm) Weight: 183 lb 3.2 oz (83.1 kg) IBW/kg (Calculated) : 70.7  Heparin Dosing Weight: 83kg  Vital Signs: Temp: 97.8 F (36.6 C) (11/07 0837) Temp src: Oral (11/07 0837) BP: 114/62 mmHg (11/07 0837) Pulse Rate: 69  (11/07 0837)  Labs:  Basename 09/12/12 0550 09/11/12 0448 09/10/12 2329 09/10/12 2008 09/10/12 1738 09/10/12 1121 09/10/12 1120 09/10/12 0916  HGB 12.8* 13.4 -- -- -- -- -- --  HCT 38.0* 39.1 -- -- -- -- -- 40.3  PLT 161 160 -- -- -- -- -- 170  APTT -- -- -- -- -- 30 -- 31  LABPROT -- -- -- -- -- 14.1 -- 14.8  INR -- -- -- -- -- 1.10 -- 1.18  HEPARINUNFRC 0.35 0.40 -- 0.55 -- -- -- --  CREATININE -- 0.89 -- -- -- -- -- 1.05  CKTOTAL -- -- -- -- -- -- 35 --  CKMB -- -- -- -- -- -- 2.1 --  TROPONINI -- -- <0.30 -- <0.30 -- <0.30 --    Estimated Creatinine Clearance: 83.9 ml/min (by C-G formula based on Cr of 0.89).   Medications:  Heparin @ 1300 units/hr  Assessment: 65yom resumed on heparin after cath yesterday since stenting of LM was not able to be performed. He is awaiting re-do CABG next week after brilinta washout. Heparin level this morning is therapeutic. CBC is stable. No bleeding reported.  Goal of Therapy:  Heparin level 0.3-0.7 units/ml Monitor platelets by anticoagulation protocol: Yes   Plan:  1) Continue heparin at 1300 units/hr 2) Follow up heparin level and CBC in AM  Fredrik Rigger 09/12/2012,8:50 AM

## 2012-09-13 DIAGNOSIS — R0981 Nasal congestion: Secondary | ICD-10-CM | POA: Insufficient documentation

## 2012-09-13 DIAGNOSIS — I2510 Atherosclerotic heart disease of native coronary artery without angina pectoris: Secondary | ICD-10-CM

## 2012-09-13 LAB — CBC
MCH: 30.7 pg (ref 26.0–34.0)
MCHC: 34.4 g/dL (ref 30.0–36.0)
MCV: 89.3 fL (ref 78.0–100.0)
Platelets: 163 10*3/uL (ref 150–400)

## 2012-09-13 MED ORDER — METOPROLOL TARTRATE 25 MG PO TABS
25.0000 mg | ORAL_TABLET | Freq: Two times a day (BID) | ORAL | Status: DC
  Administered 2012-09-13 – 2012-09-18 (×10): 25 mg via ORAL
  Filled 2012-09-13 (×13): qty 1

## 2012-09-13 MED ORDER — HYDROCHLOROTHIAZIDE 12.5 MG PO CAPS
12.5000 mg | ORAL_CAPSULE | Freq: Every day | ORAL | Status: DC
  Administered 2012-09-13 – 2012-09-18 (×5): 12.5 mg via ORAL
  Filled 2012-09-13 (×5): qty 1

## 2012-09-13 NOTE — Progress Notes (Signed)
ANTICOAGULATION CONSULT NOTE - Follow Up Consult  Pharmacy Consult for Heparin Indication: chest pain/ACS  No Known Allergies  Patient Measurements: Height: 5\' 9"  (175.3 cm) Weight: 183 lb 3.2 oz (83.1 kg) IBW/kg (Calculated) : 70.7  Heparin Dosing Weight: 83kg  Vital Signs: Temp: 97.2 F (36.2 C) (11/08 0612) Temp src: Oral (11/08 0612) BP: 109/62 mmHg (11/08 0612) Pulse Rate: 54  (11/08 0612)  Labs:  Alvira Philips 09/13/12 0605 09/12/12 0550 09/11/12 0448 09/10/12 2329 09/10/12 1738 09/10/12 1121 09/10/12 1120  HGB 12.1* 12.8* -- -- -- -- --  HCT 35.2* 38.0* 39.1 -- -- -- --  PLT 163 161 160 -- -- -- --  APTT -- -- -- -- -- 30 --  LABPROT -- -- -- -- -- 14.1 --  INR -- -- -- -- -- 1.10 --  HEPARINUNFRC 0.53 0.35 0.40 -- -- -- --  CREATININE -- -- 0.89 -- -- -- --  CKTOTAL -- -- -- -- -- -- 35  CKMB -- -- -- -- -- -- 2.1  TROPONINI -- -- -- <0.30 <0.30 -- <0.30    Estimated Creatinine Clearance: 83.9 ml/min (by C-G formula based on Cr of 0.89).   Medications:  Heparin @ 1300 units/hr  Assessment: 65yom resumed on heparin after cath 11/6 since stenting of LM was not able to be performed. He is awaiting re-do CABG next week after brilinta washout. Heparin level this morning is therapeutic at 0.53 on 1300 units/hr. CBC is stable. No bleeding reported. Last dose of Brilinta 09/10/12. Redo CABG planned for next Thursday 09/19/12.  Goal of Therapy:  Heparin level 0.3-0.7 units/ml Monitor platelets by anticoagulation protocol: Yes   Plan:  1) Continue heparin at 1300 units/hr 2) Follow up heparin level and CBC in AM Herby Abraham, Pharm.D. [phone] 09/13/2012 10:37 AM

## 2012-09-13 NOTE — Progress Notes (Signed)
2 Days Post-Op Procedure(s) (LRB): LEFT HEART CATHETERIZATION WITH CORONARY ANGIOGRAM (N/A) Subjective: No chest pain or dyspnea   Objective: Vital signs in last 24 hours: Temp:  [97.2 F (36.2 C)-98.1 F (36.7 C)] 97.2 F (36.2 C) (11/08 0612) Pulse Rate:  [54-68] 54  (11/08 0612) Cardiac Rhythm:  [-] Normal sinus rhythm;Sinus bradycardia (11/07 2000) Resp:  [17-18] 17  (11/08 0612) BP: (102-118)/(52-63) 109/62 mmHg (11/08 0612) SpO2:  [99 %-100 %] 100 % (11/08 0612)  Hemodynamic parameters for last 24 hours:    Intake/Output from previous day: 11/07 0701 - 11/08 0700 In: 1931.2 [P.O.:480; I.V.:1451.2] Out: 875 [Urine:875] Intake/Output this shift:    General appearance: alert and cooperative Heart: regular rate and rhythm, S1, S2 normal, no murmur, click, rub or gallop Lungs: clear to auscultation bilaterally  Lab Results:  [facility] 09/13/12 0605 09/12/12 0550  WBC 7.5 11.0*  HGB 12.1* 12.8*  HCT 35.2* 38.0*  PLT 163 161   BMET:  Basename 09/11/12 0448 09/10/12 0916  NA 139 136  K 3.7 3.9  CL 105 102  CO2 25 26  GLUCOSE 103* 118*  BUN 20 23  CREATININE 0.89 1.05  CALCIUM 8.8 9.8    PT/INR:  Basename 09/10/12 1121  LABPROT 14.1  INR 1.10   ABG    Component Value Date/Time   PHART 7.367 12/03/2011 0545   HCO3 22.2 12/03/2011 0545   TCO2 21 12/03/2011 1657   ACIDBASEDEF 3.0* 12/03/2011 0545   O2SAT 99.0 12/03/2011 0545   CBG (last 3)  No results found for this basename: GLUCAP:3 in the last 72 hours  Assessment/Plan: S/P Procedure(s) (LRB): LEFT HEART CATHETERIZATION WITH CORONARY ANGIOGRAM (N/A) Plan redo CABG to LCx. Unfortunately he was on Brilinta and will need to wait 5 days to do surgery to minimize bleeding. My schedule is full Monday through Wednesday next week so he will have to wait until Thursday morning as long as things are stable.   LOS: 3 days    BARTLE,BRYAN K 09/13/2012

## 2012-09-13 NOTE — Progress Notes (Signed)
Subjective: No complaints of chest pain, congestion symptoms treated last pm and he does feel better today.  Objective: Vital signs in last 24 hours: Temp:  [97.2 F (36.2 C)-98.1 F (36.7 C)] 97.2 F (36.2 C) (11/08 0612) Pulse Rate:  [54-68] 54  (11/08 0612) Resp:  [17-18] 17  (11/08 0612) BP: (102-118)/(52-63) 109/62 mmHg (11/08 0612) SpO2:  [99 %-100 %] 100 % (11/08 0612) Weight change:  Last BM Date: 09/11/12 Intake/Output from previous day: +1130 11/07 0701 - 11/08 0700 In: 1931.2 [P.O.:480; I.V.:1451.2] Out: 875 [Urine:875] Intake/Output this shift:    PE: General:alert and oriented, MAE Heart:RRR soft, systolic murmur  Lungs:clear without rales, or rhonchi Abd:+ BS, soft, non tender Ext:no edema, rt. Cath site with minimal bruising   Lab Results:  [facility] 09/13/12 0605 09/12/12 0550  WBC 7.5 11.0*  HGB 12.1* 12.8*  HCT 35.2* 38.0*  PLT 163 161   BMET  Basename 09/11/12 0448 09/10/12 0916  NA 139 136  K 3.7 3.9  CL 105 102  CO2 25 26  GLUCOSE 103* 118*  BUN 20 23  CREATININE 0.89 1.05  CALCIUM 8.8 9.8    Basename 09/10/12 2329 09/10/12 1738  TROPONINI <0.30 <0.30    Lab Results  Component Value Date   CHOL 102 09/11/2012   HDL 33* 09/11/2012   LDLCALC 48 09/11/2012   TRIG 104 09/11/2012   CHOLHDL 3.1 09/11/2012   Lab Results  Component Value Date   HGBA1C 6.0* 09/03/2012     Lab Results  Component Value Date   TSH 2.229 09/10/2012       Studies/Results: Cardiac cath and attempted PCI: LM remains flushly occluded and not visualized from central arta. Therefore, attempt at PCI for LM stent was not able to be performed. Successful Mynx closure 6FR of Right FA.   Plan for CABG, re do on Thursday of next week   Medications: I have reviewed the patient's current medications.    Marland Kitchen amLODipine  5 mg Oral Daily  . aspirin EC  81 mg Oral Daily  . atorvastatin  80 mg Oral q1800  . finasteride  5 mg Oral Daily  . fluticasone  1 spray Each Nare  Daily  . guaiFENesin  1,200 mg Oral BID  . hydrochlorothiazide  12.5 mg Oral Daily  . isosorbide mononitrate  30 mg Oral Daily  . loratadine  10 mg Oral Daily  . metoprolol  50 mg Oral BID  . pantoprazole  80 mg Oral Daily  . ranolazine  1,000 mg Oral BID  . terazosin  5 mg Oral QHS  . [DISCONTINUED] guaiFENesin  600 mg Oral BID   Assessment/Plan: Principal Problem:  *Unstable angina, on appropriate medical therapy, plan for re-do CABG 09/19/12  Active Problems:  S/P CABG x 3: LIMA TO LAD, SVG TO OM, SVG TO RCA., VG to OM occluded, jeopardized LCX, by cath 08/29/12  CAD (coronary artery disease), severe 4 vessell at cath with 99% LMCA under going CABG 11/2011.  HTN (hypertension)  Nasal sinus congestion  PLAN: CABG planned for 09/19/12, pt currently on Heparin, failed medical therapy at home.   LOS: 3 days   [name],LAURA R 09/13/2012, 9:14 AM  I seen and evaluated the patient this morning along with the PA/NP. I agree with their findings, examination as well as impression recommendations.  Unfortunately - CABG will be delayed (had received Ticagrelor up until 2 days ago), however, he has failed OP treatment & remains on IV Heparin along with oral nitrate,  BB & CCB along with Ranexa for anti-anginal relief.  Continue current Rx - low level activity.  Marykay Lex, M.D., M.S. [facility] [address] [place], [place]  [address]  [phone] Pager # [phone] 09/13/2012 4:50 PM

## 2012-09-14 LAB — CBC
HCT: 36.9 % — ABNORMAL LOW (ref 39.0–52.0)
MCH: 29.9 pg (ref 26.0–34.0)
MCHC: 34.1 g/dL (ref 30.0–36.0)
RDW: 14.7 % (ref 11.5–15.5)

## 2012-09-14 LAB — HEPARIN LEVEL (UNFRACTIONATED): Heparin Unfractionated: 0.61 IU/mL (ref 0.30–0.70)

## 2012-09-14 NOTE — Progress Notes (Signed)
[facility] Progress Note  Subjective:  No recurrent chest pain on iv heparin.  Objective:   Vital Signs in the last 24 hours: Temp:  [97.8 F (36.6 C)-98.1 F (36.7 C)] 97.8 F (36.6 C) (11/09 0458) Pulse Rate:  [48-56] 55  (11/09 0458) Resp:  [17-18] 18  (11/09 0458) BP: (100-118)/(55-65) 118/65 mmHg (11/09 0458) SpO2:  [99 %-100 %] 99 % (11/09 0458)  Intake/Output from previous day: 11/08 0701 - 11/09 0700 In: 1080 [P.O.:1080] Out: 400 [Urine:400]  Scheduled:   . amLODipine  5 mg Oral Daily  . aspirin EC  81 mg Oral Daily  . atorvastatin  80 mg Oral q1800  . finasteride  5 mg Oral Daily  . fluticasone  1 spray Each Nare Daily  . guaiFENesin  1,200 mg Oral BID  . hydrochlorothiazide  12.5 mg Oral Daily  . isosorbide mononitrate  30 mg Oral Daily  . loratadine  10 mg Oral Daily  . metoprolol  25 mg Oral BID  . pantoprazole  80 mg Oral Daily  . ranolazine  1,000 mg Oral BID  . terazosin  5 mg Oral QHS  . [DISCONTINUED] hydrochlorothiazide  12.5 mg Oral Daily  . [DISCONTINUED] metoprolol  50 mg Oral BID    Physical Exam:   General appearance: alert, cooperative and no distress Neck: no JVD and supple, symmetrical, trachea midline Lungs: clear to auscultation bilaterally Heart: S1, S2 normal and 1/6 sem Abdomen: soft, non-tender; bowel sounds normal; no masses,  no organomegaly Extremities: no edema, redness or tenderness in the calves or thighs   Rate: 58  Rhythm: sinus bradycardia  Lab Results:   No results found for this basename: NA:2,K:2,CL:2,CO2:2,GLUCOSE:2,BUN:2,CREATININE:2 in the last 72 hours No results found for this basename: TROPONINI:2,CK,MB:2 in the last 72 hours Hepatic Function Panel No results found for this basename: PROT,ALBUMIN,AST,ALT,ALKPHOS,BILITOT,BILIDIR,IBILI in the last 72 hours No results found for this basename: INR in the last 72 hours  Lipid Panel     Component Value Date/Time   CHOL 102  09/11/2012 0448   TRIG 104 09/11/2012 0448   HDL 33* 09/11/2012 0448   CHOLHDL 3.1 09/11/2012 0448   VLDL 21 09/11/2012 0448   LDLCALC 48 09/11/2012 0448     Imaging:  No results found.    Assessment/Plan:   Principal Problem:  *Unstable angina, on appropriate medical therapy, plan for re-do CABG 09/19/12  Active Problems:  S/P CABG x 3: LIMA TO LAD, SVG TO OM, SVG TO RCA., VG to OM occluded, jeopardized LCX, by cath 08/29/12  CAD (coronary artery disease), severe 4 vessell at cath with 99% LMCA under going CABG 11/2011.  HTN (hypertension)  Nasal sinus congestion  No recurrent chest pain or shortness of breath. Undergoing brilinta washout, on IV heparin awaiting re CABG to LCx.  Hopefully scheduling will allow CABG to be done sooner than Thursday.   Lennette Bihari, MD, [facility] 09/14/2012, 8:52 AM

## 2012-09-14 NOTE — Progress Notes (Signed)
ANTICOAGULATION CONSULT NOTE - Follow Up Consult  Pharmacy Consult for Heparin Indication: chest pain/ACS  No Known Allergies  Patient Measurements: Height: 5\' 9"  (175.3 cm) Weight: 183 lb 3.2 oz (83.1 kg) IBW/kg (Calculated) : 70.7  Heparin Dosing Weight: 83kg  Vital Signs: Temp: 97.8 F (36.6 C) (11/09 0458) Temp src: Oral (11/09 0458) BP: 132/64 mmHg (11/09 1054) Pulse Rate: 61  (11/09 1054)  Labs:  Basename 09/14/12 0600 09/13/12 0605 09/12/12 0550  HGB 12.6* 12.1* --  HCT 36.9* 35.2* 38.0*  PLT 184 163 161  APTT -- -- --  LABPROT -- -- --  INR -- -- --  HEPARINUNFRC 0.61 0.53 0.35  CREATININE -- -- --  CKTOTAL -- -- --  CKMB -- -- --  TROPONINI -- -- --    Estimated Creatinine Clearance: 83.9 ml/min (by C-G formula based on Cr of 0.89).   Medications:  Heparin @ 1300 units/hr  Assessment: 65yom resumed on heparin after cath 11/6 since stenting of LM was not able to be performed. He is awaiting re-do CABG next week after brilinta washout. Heparin level this morning is therapeutic at 0.61 on 1300 units/hr. CBC is stable. No bleeding reported. No recurrent chest pain on iv heparin. Last dose of Brilinta 09/10/12. Redo CABG to LCx planned for next Thursday 09/19/12 possibly sooner is schedule allows.  Goal of Therapy:  Heparin level 0.3-0.7 units/ml Monitor platelets by anticoagulation protocol: Yes   Plan:  1) Continue heparin at 1300 units/hr 2) Follow up heparin level and CBC in AM  Noah Delaine, RPh Clinical Pharmacist Pager: [phone] 09/14/2012 11:29 AM

## 2012-09-15 LAB — CBC
HCT: 36.5 % — ABNORMAL LOW (ref 39.0–52.0)
MCHC: 34 g/dL (ref 30.0–36.0)
MCV: 88.4 fL (ref 78.0–100.0)
Platelets: 194 10*3/uL (ref 150–400)
RDW: 14.9 % (ref 11.5–15.5)
WBC: 7.8 10*3/uL (ref 4.0–10.5)

## 2012-09-15 LAB — HEPARIN LEVEL (UNFRACTIONATED): Heparin Unfractionated: 0.55 IU/mL (ref 0.30–0.70)

## 2012-09-15 NOTE — Progress Notes (Signed)
ANTICOAGULATION CONSULT NOTE - Follow Up Consult  Pharmacy Consult for Heparin Indication: chest pain/ACS  No Known Allergies  Patient Measurements: Height: 5\' 9"  (175.3 cm) Weight: 183 lb 3.2 oz (83.1 kg) IBW/kg (Calculated) : 70.7  Heparin Dosing Weight: 83kg  Vital Signs: Temp: 98.4 F (36.9 C) (11/10 0404) Temp src: Oral (11/10 0404) BP: 100/49 mmHg (11/10 1026) Pulse Rate: 51  (11/10 1026)  Labs:  Basename 09/15/12 0430 09/14/12 0600 09/13/12 0605  HGB 12.4* 12.6* --  HCT 36.5* 36.9* 35.2*  PLT 194 184 163  APTT -- -- --  LABPROT -- -- --  INR -- -- --  HEPARINUNFRC 0.55 0.61 0.53  CREATININE -- -- --  CKTOTAL -- -- --  CKMB -- -- --  TROPONINI -- -- --    Estimated Creatinine Clearance: 83.9 ml/min (by C-G formula based on Cr of 0.89).   Medications:  Heparin @ 1300 units/hr  Assessment: 65yom resumed on heparin after cath 11/6 since stenting of LM was not able to be performed. He is awaiting re-do CABG next week after brilinta washout. Heparin level this morning is therapeutic at 0.55 on 1300 units/hr. CBC is stable. PLTC 194K.  No bleeding reported.   Last dose of Brilinta 09/10/12. Redo CABG to LCx planned for next Thursday 09/19/12 possibly sooner is schedule allows.  Goal of Therapy:  Heparin level 0.3-0.7 units/ml Monitor platelets by anticoagulation protocol: Yes   Plan:  1) Continue heparin at 1300 units/hr 2) Follow up heparin level and CBC in AM  Noah Delaine, RPh Clinical Pharmacist Pager: [phone] 09/15/2012 11:03 AM

## 2012-09-15 NOTE — Progress Notes (Signed)
[facility] Progress Note  Subjective:  No further chest pain on heparin.  Objective:   Vital Signs in the last 24 hours: Temp:  [97.5 F (36.4 C)-98.5 F (36.9 C)] 98.4 F (36.9 C) (11/10 0404) Pulse Rate:  [49-66] 51  (11/10 1026) Resp:  [18-19] 19  (11/10 0404) BP: (100-115)/(49-59) 100/49 mmHg (11/10 1026) SpO2:  [100 %] 100 % (11/10 0404)  Intake/Output from previous day: 11/09 0701 - 11/10 0700 In: 300 [P.O.:300] Out: -   Scheduled:   . amLODipine  5 mg Oral Daily  . aspirin EC  81 mg Oral Daily  . atorvastatin  80 mg Oral q1800  . finasteride  5 mg Oral Daily  . fluticasone  1 spray Each Nare Daily  . guaiFENesin  1,200 mg Oral BID  . hydrochlorothiazide  12.5 mg Oral Daily  . isosorbide mononitrate  30 mg Oral Daily  . loratadine  10 mg Oral Daily  . metoprolol  25 mg Oral BID  . pantoprazole  80 mg Oral Daily  . ranolazine  1,000 mg Oral BID  . terazosin  5 mg Oral QHS    Physical Exam:   General appearance: alert, cooperative and no distress Neck: no JVD Lungs: clear to auscultation bilaterally Heart: S1, S2 normal  1/6 sem Abdomen: soft, non-tender; bowel sounds normal; no masses,  no organomegaly Extremities: no edema, redness or tenderness in the calves or thighs   Rate:  52  Rhythm: sinus bradycardia  Lab Results:   No results found for this basename: NA:2,K:2,CL:2,CO2:2,GLUCOSE:2,BUN:2,CREATININE:2 in the last 72 hours No results found for this basename: TROPONINI:2,CK,MB:2 in the last 72 hours Hepatic Function Panel No results found for this basename: PROT,ALBUMIN,AST,ALT,ALKPHOS,BILITOT,BILIDIR,IBILI in the last 72 hours No results found for this basename: INR in the last 72 hours  Lipid Panel     Component Value Date/Time   CHOL 102 09/11/2012 0448   TRIG 104 09/11/2012 0448   HDL 33* 09/11/2012 0448   CHOLHDL 3.1 09/11/2012 0448   VLDL 21 09/11/2012 0448   LDLCALC 48 09/11/2012 0448     Imaging:  No  results found.    Assessment/Plan:   Principal Problem:  *Unstable angina, on appropriate medical therapy, plan for re-do CABG 09/19/12  Active Problems:  S/P CABG x 3: LIMA TO LAD, SVG TO OM, SVG TO RCA., VG to OM occluded, jeopardized LCX, by cath 08/29/12  CAD (coronary artery disease), severe 4 vessell at cath with 99% LMCA under going CABG 11/2011.  HTN (hypertension)  Nasal sinus congestion   Therapeutic heparin.No recurrent angina on increased medical therapy.  Awaiting CABG.   Lennette Bihari, MD, [facility] 09/15/2012, 11:36 AM

## 2012-09-16 LAB — CBC
MCH: 30.1 pg (ref 26.0–34.0)
MCV: 87.1 fL (ref 78.0–100.0)
Platelets: 202 10*3/uL (ref 150–400)
RBC: 4.25 MIL/uL (ref 4.22–5.81)
RDW: 14.8 % (ref 11.5–15.5)

## 2012-09-16 NOTE — Progress Notes (Signed)
Subjective: No complaints  Objective: Vital signs in last 24 hours: Temp:  [97.5 F (36.4 C)-98.6 F (37 C)] 97.5 F (36.4 C) (11/11 1029) Pulse Rate:  [50-60] 60  (11/11 1029) Resp:  [18-20] 19  (11/11 1029) BP: (102-112)/(54-66) 112/66 mmHg (11/11 1029) SpO2:  [98 %-100 %] 98 % (11/11 1029) Weight change:  Last BM Date: 09/16/12 Intake/Output from previous day: +480 11/10 0701 - 11/11 0700 In: 480 [P.O.:480] Out: -  Intake/Output this shift: Total I/O In: 240 [P.O.:240] Out: -   PE: General:alert, oriented, non complaints on IV heparin Heart:S1S2 RRR  Lungs:clear Abd:+ BS soft, non tender Ext:no edema    Lab Results:  Basename 09/16/12 0500 09/15/12 0430  WBC 9.1 7.8  HGB 12.8* 12.4*  HCT 37.0* 36.5*  PLT 202 194     Lab Results  Component Value Date   CHOL 102 09/11/2012   HDL 33* 09/11/2012   LDLCALC 48 09/11/2012   TRIG 104 09/11/2012   CHOLHDL 3.1 09/11/2012   Lab Results  Component Value Date   HGBA1C 6.0* 09/03/2012     Lab Results  Component Value Date   TSH 2.229 09/10/2012       Studies/Results: No results found.  Medications: I have reviewed the patient's current medications.    Marland Kitchen amLODipine  5 mg Oral Daily  . aspirin EC  81 mg Oral Daily  . atorvastatin  80 mg Oral q1800  . finasteride  5 mg Oral Daily  . fluticasone  1 spray Each Nare Daily  . guaiFENesin  1,200 mg Oral BID  . hydrochlorothiazide  12.5 mg Oral Daily  . isosorbide mononitrate  30 mg Oral Daily  . loratadine  10 mg Oral Daily  . metoprolol  25 mg Oral BID  . pantoprazole  80 mg Oral Daily  . ranolazine  1,000 mg Oral BID  . terazosin  5 mg Oral QHS   Assessment/Plan: Principal Problem:  *Unstable angina, on appropriate medical therapy, plan for re-do CABG 09/19/12  Active Problems:  S/P CABG x 3: LIMA TO LAD, SVG TO OM, SVG TO RCA., VG to OM occluded, jeopardized LCX, by cath 08/29/12  CAD (coronary artery disease), severe 4 vessell at cath with 99% LMCA under  going CABG 11/2011.  HTN (hypertension)  Nasal sinus congestion  PLAN: continues on IV heparin.  Plan for re-do CABG on 09/19/12.  Encouraged to use incentive spirometer.  LOS: 6 days   Jodi Criscuolo R 09/16/2012, 12:36 PM

## 2012-09-16 NOTE — Progress Notes (Signed)
ANTICOAGULATION CONSULT NOTE - Follow Up Consult  Pharmacy Consult for Heparin Indication: chest pain/ACS, awaiting re-do CABG  No Known Allergies  Patient Measurements: Height: 5\' 9"  (175.3 cm) Weight: 183 lb 3.2 oz (83.1 kg) IBW/kg (Calculated) : 70.7  Heparin Dosing Weight: 83 kg  Vital Signs: Temp: 97.7 F (36.5 C) (11/11 0357) Temp src: Oral (11/11 0357) BP: 102/54 mmHg (11/11 0357) Pulse Rate: 58  (11/11 0357)  Labs:  Basename 09/16/12 0500 09/15/12 0430 09/14/12 0600  HGB 12.8* 12.4* --  HCT 37.0* 36.5* 36.9*  PLT 202 194 184  APTT -- -- --  LABPROT -- -- --  INR -- -- --  HEPARINUNFRC 0.48 0.55 0.61  CREATININE -- -- --  CKTOTAL -- -- --  CKMB -- -- --  TROPONINI -- -- --    Estimated Creatinine Clearance: 83.9 ml/min (by C-G formula based on Cr of 0.89).   Medications:  Heparin drip at 1300 units/hr  Assessment: 66 yo M on heparin after cath 11/6 since stenting of LM was not able to be performed. He is awaiting re-do CABG after brilinta washout. Heparin level this morning is therapeutic at 0.48 on 1300 units/hr. CBC is stable. No bleeding reported.   Last dose of Brilinta 09/10/12. Redo CABG to LCx planned for Thursday 09/19/12 possibly sooner as schedule allows.  Goal of Therapy:  Heparin level 0.3-0.7 units/ml Monitor platelets by anticoagulation protocol: Yes   Plan:  -Continue heparin drip at 1300 units/hr -Follow up daily heparin level and CBC  [facility] [place].D., BCPS Clinical Pharmacist Pager: [phone] 09/16/2012 8:50 AM

## 2012-09-16 NOTE — Progress Notes (Signed)
Pt. Seen and examined. Agree with the NP/PA-C note as written.  Chest pain free on heparin. Labs stable. Awaiting CABG.  Chrystie Nose, MD, [facility] Attending Cardiologist [facility]

## 2012-09-17 LAB — CBC
Hemoglobin: 12.5 g/dL — ABNORMAL LOW (ref 13.0–17.0)
MCH: 30.1 pg (ref 26.0–34.0)
MCHC: 34.1 g/dL (ref 30.0–36.0)
Platelets: 206 10*3/uL (ref 150–400)
RDW: 14.8 % (ref 11.5–15.5)

## 2012-09-17 LAB — HEPARIN LEVEL (UNFRACTIONATED): Heparin Unfractionated: 0.52 IU/mL (ref 0.30–0.70)

## 2012-09-17 NOTE — Care Management Note (Unsigned)
    Page 1 of 1   09/17/2012     3:39:28 PM   CARE MANAGEMENT NOTE 09/17/2012  Patient:  [name], [name]   Account Number:  [id number]  Date Initiated:  09/17/2012  Documentation initiated by:  Sendy Pluta  Subjective/Objective Assessment:   PT ADM ON 09/10/12 FOR BRILINTA WASHOUT PRIOR TO CABG.  PTA, PT INDEPENDENT, LIVES WITH SPOUSE.     Action/Plan:   WIFE TO PROVIDE CARE AT DISCHARGE.  WILL FOLLOW FOR HOME NEEDS AS PT PROGRESSES.  CABG SCHEDULED FOR 09/19/12.   Anticipated DC Date:  09/24/2012   Anticipated DC Plan:  HOME W HOME HEALTH SERVICES      DC Planning Services  CM consult      Choice offered to / List presented to:             Status of service:  In process, will continue to follow Medicare Important Message given?   (If response is "NO", the following Medicare IM given date fields will be blank) Date Medicare IM given:   Date Additional Medicare IM given:    Discharge Disposition:    Per UR Regulation:  Reviewed for med. necessity/level of care/duration of stay  If discussed at Long Length of Stay Meetings, dates discussed:   09/17/2012    Comments:

## 2012-09-17 NOTE — Progress Notes (Signed)
Subjective: No complaints on IV heparin  Objective: Vital signs in last 24 hours: Temp:  [97.6 F (36.4 C)-98.1 F (36.7 C)] 98.1 F (36.7 C) (11/12 0815) Pulse Rate:  [53-55] 53  (11/12 0815) Resp:  [18-19] 18  (11/12 0815) BP: (99-121)/(59-71) 112/71 mmHg (11/12 0815) SpO2:  [99 %-100 %] 99 % (11/12 0815) Weight change:  Last BM Date: 09/16/12 Intake/Output from previous day: +720 11/11 0701 - 11/12 0700 In: 720 [P.O.:720] Out: -  Intake/Output this shift: Total I/O In: 240 [P.O.:240] Out: -   PE: General: Heart: Lungs: Abd: Ext: Neuro:   Lab Results:  Basename 09/17/12 0530 09/16/12 0500  WBC 9.7 9.1  HGB 12.5* 12.8*  HCT 36.7* 37.0*  PLT 206 202   BMET No results found for this basename: NA:2,K:2,CL:2,CO2:2,GLUCOSE:2,BUN:2,CREATININE:2,CALCIUM:2,MAGNESIUM:2 in the last 72 hours No results found for this basename: TROPONINI:2,CK,MB:2 in the last 72 hours  Lab Results  Component Value Date   CHOL 102 09/11/2012   HDL 33* 09/11/2012   LDLCALC 48 09/11/2012   TRIG 104 09/11/2012   CHOLHDL 3.1 09/11/2012   Lab Results  Component Value Date   HGBA1C 6.0* 09/03/2012     Lab Results  Component Value Date   TSH 2.229 09/10/2012    Hepatic Function Panel No results found for this basename: PROT,ALBUMIN,AST,ALT,ALKPHOS,BILITOT,BILIDIR,IBILI in the last 72 hours No results found for this basename: CHOL in the last 72 hours No results found for this basename: PROTIME in the last 72 hours    EKG: Orders placed during the hospital encounter of 09/10/12  . EKG 12-LEAD  . EKG 12-LEAD  . EKG 12-LEAD  . EKG 12-LEAD  . EKG 12-LEAD  . EKG 12-LEAD  . EKG 12-LEAD  . EKG    Studies/Results: No results found.  Medications: I have reviewed the patient's current medications.    Marland Kitchen amLODipine  5 mg Oral Daily  . aspirin EC  81 mg Oral Daily  . atorvastatin  80 mg Oral q1800  . finasteride  5 mg Oral Daily  . fluticasone  1 spray Each Nare Daily  . guaiFENesin   1,200 mg Oral BID  . hydrochlorothiazide  12.5 mg Oral Daily  . isosorbide mononitrate  30 mg Oral Daily  . loratadine  10 mg Oral Daily  . metoprolol  25 mg Oral BID  . pantoprazole  80 mg Oral Daily  . ranolazine  1,000 mg Oral BID  . terazosin  5 mg Oral QHS   Assessment/Plan: Principal Problem:  *Unstable angina, on appropriate medical therapy, plan for re-do CABG 09/19/12  Active Problems:  S/P CABG x 3: LIMA TO LAD, SVG TO OM, SVG TO RCA., VG to OM occluded, jeopardized LCX, by cath 08/29/12  CAD (coronary artery disease), severe 4 vessell at cath with 99% LMCA under going CABG 11/2011.  HTN (hypertension)  Nasal sinus congestion  PLAN:For re do CABG on Thursday, stable on IV heparin     . LOS: 7 days   [name],LAURA R 09/17/2012, 12:23 PM   Agree with note written by Nada Boozer RNP  No further CP on iv hep. Brilenta on hold. For re do CABG on Thurs.  Runell Gess 09/17/2012 12:43 PM

## 2012-09-17 NOTE — Progress Notes (Signed)
ANTICOAGULATION CONSULT NOTE - Follow Up Consult  Pharmacy Consult for Heparin Indication: chest pain/ACS, awaiting re-do CABG  No Known Allergies  Labs:  Basename 09/17/12 0530 09/16/12 0500 09/15/12 0430  HGB 12.5* 12.8* --  HCT 36.7* 37.0* 36.5*  PLT 206 202 194  APTT -- -- --  LABPROT -- -- --  INR -- -- --  HEPARINUNFRC 0.52 0.48 0.55  CREATININE -- -- --  CKTOTAL -- -- --  CKMB -- -- --  TROPONINI -- -- --    Estimated Creatinine Clearance: 83.9 ml/min (by C-G formula based on Cr of 0.89).   Assessment: 66 yo M on heparin after cath 11/6 since stenting of LM was not able to be performed. He is awaiting re-do CABG after brilinta washout. Heparin level this morning is therapeutic at 0.52 on 1300 units/hr. CBC is stable. No bleeding reported.   Goal of Therapy:  Heparin level 0.3-0.7 units/ml Monitor platelets by anticoagulation protocol: Yes   Plan:  -Continue heparin drip at 1300 units/hr -Follow up daily heparin level and CBC  Thank you. Okey Regal, PharmD [phone]  09/17/2012 9:09 AM

## 2012-09-18 LAB — URINALYSIS, ROUTINE W REFLEX MICROSCOPIC
Hgb urine dipstick: NEGATIVE
Protein, ur: NEGATIVE mg/dL
Urobilinogen, UA: 1 mg/dL (ref 0.0–1.0)

## 2012-09-18 LAB — BLOOD GAS, ARTERIAL
Acid-Base Excess: 1.3 mmol/L (ref 0.0–2.0)
Bicarbonate: 24.8 mEq/L — ABNORMAL HIGH (ref 20.0–24.0)
Drawn by: 23588
FIO2: 0.21 %
O2 Saturation: 97.5 %
pO2, Arterial: 93.1 mmHg (ref 80.0–100.0)

## 2012-09-18 LAB — URINE MICROSCOPIC-ADD ON

## 2012-09-18 LAB — CBC
HCT: 35.9 % — ABNORMAL LOW (ref 39.0–52.0)
MCHC: 34.3 g/dL (ref 30.0–36.0)
Platelets: 210 10*3/uL (ref 150–400)
RDW: 14.7 % (ref 11.5–15.5)
WBC: 8.2 10*3/uL (ref 4.0–10.5)

## 2012-09-18 LAB — HEPARIN LEVEL (UNFRACTIONATED): Heparin Unfractionated: 0.52 IU/mL (ref 0.30–0.70)

## 2012-09-18 MED ORDER — PHENYLEPHRINE HCL 10 MG/ML IJ SOLN
30.0000 ug/min | INTRAVENOUS | Status: DC
  Filled 2012-09-18: qty 2

## 2012-09-18 MED ORDER — DOPAMINE-DEXTROSE 3.2-5 MG/ML-% IV SOLN
2.0000 ug/kg/min | INTRAVENOUS | Status: DC
  Filled 2012-09-18: qty 250

## 2012-09-18 MED ORDER — METOPROLOL TARTRATE 12.5 MG HALF TABLET
12.5000 mg | ORAL_TABLET | Freq: Once | ORAL | Status: AC
  Administered 2012-09-19: 12.5 mg via ORAL
  Filled 2012-09-18: qty 1

## 2012-09-18 MED ORDER — AMINOCAPROIC ACID 250 MG/ML IV SOLN
INTRAVENOUS | Status: AC
  Administered 2012-09-19: 69.2 mL/h via INTRAVENOUS
  Filled 2012-09-18: qty 40

## 2012-09-18 MED ORDER — SODIUM CHLORIDE 0.9 % IV SOLN
INTRAVENOUS | Status: AC
  Administered 2012-09-19: 1 [IU]/h via INTRAVENOUS
  Filled 2012-09-18: qty 1

## 2012-09-18 MED ORDER — PLASMA-LYTE 148 IV SOLN
INTRAVENOUS | Status: AC
  Administered 2012-09-19: 09:00:00
  Filled 2012-09-18: qty 2.5

## 2012-09-18 MED ORDER — EPINEPHRINE HCL 1 MG/ML IJ SOLN
0.5000 ug/min | INTRAMUSCULAR | Status: DC
  Filled 2012-09-18: qty 4

## 2012-09-18 MED ORDER — ALPRAZOLAM 0.25 MG PO TABS: 0.2500 mg | ORAL_TABLET | ORAL | Status: DC | PRN

## 2012-09-18 MED ORDER — NITROGLYCERIN IN D5W 200-5 MCG/ML-% IV SOLN
2.0000 ug/min | INTRAVENOUS | Status: AC
  Administered 2012-09-19: 5 ug/min via INTRAVENOUS
  Filled 2012-09-18: qty 250

## 2012-09-18 MED ORDER — DEXMEDETOMIDINE HCL IN NACL 400 MCG/100ML IV SOLN
0.1000 ug/kg/h | INTRAVENOUS | Status: AC
  Administered 2012-09-19: 0.2 ug/kg/h via INTRAVENOUS
  Filled 2012-09-18: qty 100

## 2012-09-18 MED ORDER — POTASSIUM CHLORIDE 2 MEQ/ML IV SOLN
80.0000 meq | INTRAVENOUS | Status: DC
  Filled 2012-09-18: qty 40

## 2012-09-18 MED ORDER — DEXTROSE 5 % IV SOLN
1.5000 g | INTRAVENOUS | Status: AC
  Administered 2012-09-19: .75 g via INTRAVENOUS
  Administered 2012-09-19: 1.5 g via INTRAVENOUS
  Filled 2012-09-18 (×2): qty 1.5

## 2012-09-18 MED ORDER — TEMAZEPAM 15 MG PO CAPS: 15.0000 mg | ORAL_CAPSULE | Freq: Once | ORAL | Status: AC | PRN

## 2012-09-18 MED ORDER — MAGNESIUM SULFATE 50 % IJ SOLN
40.0000 meq | INTRAMUSCULAR | Status: DC
  Filled 2012-09-18: qty 10

## 2012-09-18 MED ORDER — DIAZEPAM 5 MG PO TABS
5.0000 mg | ORAL_TABLET | Freq: Once | ORAL | Status: AC
  Administered 2012-09-19: 5 mg via ORAL
  Filled 2012-09-18: qty 1

## 2012-09-18 MED ORDER — VANCOMYCIN HCL 1000 MG IV SOLR
1250.0000 mg | INTRAVENOUS | Status: AC
  Administered 2012-09-19: 1250 mg via INTRAVENOUS
  Filled 2012-09-18 (×2): qty 1250

## 2012-09-18 MED ORDER — DEXTROSE 5 % IV SOLN
750.0000 mg | INTRAVENOUS | Status: DC
  Filled 2012-09-18: qty 750

## 2012-09-18 MED ORDER — BISACODYL 5 MG PO TBEC
5.0000 mg | DELAYED_RELEASE_TABLET | Freq: Once | ORAL | Status: AC
  Administered 2012-09-18: 5 mg via ORAL
  Filled 2012-09-18: qty 1

## 2012-09-18 MED ORDER — CHLORHEXIDINE GLUCONATE 4 % EX LIQD
60.0000 mL | Freq: Once | CUTANEOUS | Status: AC
  Administered 2012-09-19: 4 via TOPICAL
  Filled 2012-09-18: qty 60

## 2012-09-18 NOTE — Progress Notes (Signed)
7 Days Post-Op Procedure(s) (LRB): LEFT HEART CATHETERIZATION WITH CORONARY ANGIOGRAM (N/A) Subjective: No chest pain  Objective: Vital signs in last 24 hours: Temp:  [97.3 F (36.3 C)-98.3 F (36.8 C)] 98.3 F (36.8 C) (11/13 1351) Pulse Rate:  [52-57] 52  (11/13 1351) Cardiac Rhythm:  [-] Sinus bradycardia (11/13 1106) Resp:  [16-18] 18  (11/13 1351) BP: (107-143)/(58-75) 107/58 mmHg (11/13 1351) SpO2:  [100 %] 100 % (11/13 1351)  Hemodynamic parameters for last 24 hours:    Intake/Output from previous day: 11/12 0701 - 11/13 0700 In: 720 [P.O.:720] Out: -  Intake/Output this shift: Total I/O In: 600 [P.O.:600] Out: 1250 [Urine:1250]  General appearance: alert and cooperative Heart: regular rate and rhythm, S1, S2 normal, no murmur, click, rub or gallop Lungs: clear to auscultation bilaterally  Lab Results:  Basename 09/18/12 0510 09/17/12 0530  WBC 8.2 9.7  HGB 12.3* 12.5*  HCT 35.9* 36.7*  PLT 210 206   BMET: No results found for this basename: NA:2,K:2,CL:2,CO2:2,GLUCOSE:2,BUN:2,CREATININE:2,CALCIUM:2 in the last 72 hours  PT/INR: No results found for this basename: LABPROT,INR in the last 72 hours ABG    Component Value Date/Time   PHART 7.455* 09/18/2012 1255   HCO3 24.8* 09/18/2012 1255   TCO2 25.9 09/18/2012 1255   ACIDBASEDEF 3.0* 12/03/2011 0545   O2SAT 97.5 09/18/2012 1255   CBG (last 3)  No results found for this basename: GLUCAP:3 in the last 72 hours  Assessment/Plan: S/P Procedure(s) (LRB): LEFT HEART CATHETERIZATION WITH CORONARY ANGIOGRAM (N/A) Stable for redo CABG in am.   LOS: 8 days    Adal Sereno K 09/18/2012

## 2012-09-18 NOTE — Progress Notes (Signed)
ANTICOAGULATION CONSULT NOTE - Follow Up Consult  Pharmacy Consult for Heparin Indication: chest pain/ACS, awaiting re-do CABG  No Known Allergies  Labs:  Basename 09/18/12 0510 09/17/12 0530 09/16/12 0500  HGB 12.3* 12.5* --  HCT 35.9* 36.7* 37.0*  PLT 210 206 202  APTT -- -- --  LABPROT -- -- --  INR -- -- --  HEPARINUNFRC 0.52 0.52 0.48  CREATININE -- -- --  CKTOTAL -- -- --  CKMB -- -- --  TROPONINI -- -- --    Estimated Creatinine Clearance: 83.9 ml/min (by C-G formula based on Cr of 0.89).   Assessment: 66 yo M on heparin after cath 11/6 since stenting of LM was not able to be performed. He is awaiting re-do CABG after brilinta washout. Heparin level this morning is therapeutic at 0.52 on 1300 units/hr. CBC is stable. No bleeding reported.   Goal of Therapy:  Heparin level 0.3-0.7 units/ml Monitor platelets by anticoagulation protocol: Yes   Plan:  -Continue heparin drip at 1300 units/hr -Follow up daily heparin level and CBC  Thank you. Okey Regal, PharmD [phone]  09/18/2012 9:08 AM

## 2012-09-18 NOTE — Progress Notes (Signed)
Subjective: No chest pain on heparin  Objective: Vital signs in last 24 hours: Temp:  [97.3 F (36.3 C)-98.6 F (37 C)] 97.3 F (36.3 C) (11/13 0457) Pulse Rate:  [54-60] 54  (11/13 0457) Resp:  [16-18] 16  (11/13 0457) BP: (104-143)/(59-75) 110/59 mmHg (11/13 0457) SpO2:  [100 %] 100 % (11/13 0457) Weight change:  Last BM Date: 09/16/12 Intake/Output from previous day: +720  11/12 0701 - 11/13 0700 In: 720 [P.O.:720] Out: -  Intake/Output this shift: Total I/O In: 360 [P.O.:360] Out: 750 [Urine:750]  PE: General: Heart: Lungs: Abd: Ext:    Lab Results:  Basename 09/18/12 0510 09/17/12 0530  WBC 8.2 9.7  HGB 12.3* 12.5*  HCT 35.9* 36.7*  PLT 210 206    Lab Results  Component Value Date   CHOL 102 09/11/2012   HDL 33* 09/11/2012   LDLCALC 48 09/11/2012   TRIG 104 09/11/2012   CHOLHDL 3.1 09/11/2012   Lab Results  Component Value Date   HGBA1C 6.0* 09/03/2012     Lab Results  Component Value Date   TSH 2.229 09/10/2012        Studies/Results: No results found.  Medications: I have reviewed the patient's current medications. Scheduled Meds:   . amLODipine  5 mg Oral Daily  . aspirin EC  81 mg Oral Daily  . atorvastatin  80 mg Oral q1800  . finasteride  5 mg Oral Daily  . fluticasone  1 spray Each Nare Daily  . guaiFENesin  1,200 mg Oral BID  . hydrochlorothiazide  12.5 mg Oral Daily  . isosorbide mononitrate  30 mg Oral Daily  . loratadine  10 mg Oral Daily  . metoprolol  25 mg Oral BID  . pantoprazole  80 mg Oral Daily  . ranolazine  1,000 mg Oral BID  . terazosin  5 mg Oral QHS   Continuous Infusions:   . sodium chloride 20 mL/hr at 09/11/12 1800  . heparin 1,300 Units/hr (09/16/12 1000)   PRN Meds:.acetaminophen, ALPRAZolam, butalbital-acetaminophen-caffeine, HYDROcodone-acetaminophen, nitroGLYCERIN, ondansetron (ZOFRAN) IV, zolpidem, [DISCONTINUED] ALPRAZolam  Assessment/Plan: Principal Problem:  *Unstable angina, on appropriate  medical therapy, plan for re-do CABG 09/19/12  Active Problems:  S/P CABG x 3: LIMA TO LAD, SVG TO OM, SVG TO RCA., VG to OM occluded, jeopardized LCX, by cath 08/29/12  CAD (coronary artery disease), severe 4 vessell at cath with 99% LMCA under going CABG 11/2011.  HTN (hypertension)  Nasal sinus congestion  PLAN:  For CABG in AM.  LOS: 8 days   [name],[name] 09/18/2012, 9:54 AM   Agree with note written by Nada Boozer RNP  Cardiac stable for re do CABG tomorrow.  [name] 09/18/2012 10:00 AM

## 2012-09-18 NOTE — Progress Notes (Signed)
General:alert and oriented, no complaints Heart:S1S2 RRR. SR Lungs:clear without rales or rhonchi Abd:+ BS soft, non tender Ext:no edema

## 2012-09-19 ENCOUNTER — Encounter (HOSPITAL_COMMUNITY): Payer: Self-pay | Admitting: Anesthesiology

## 2012-09-19 ENCOUNTER — Inpatient Hospital Stay (HOSPITAL_COMMUNITY): Payer: Medicare Other

## 2012-09-19 ENCOUNTER — Encounter (HOSPITAL_COMMUNITY)
Admission: EM | Disposition: A | Discharge status: Home / Self Care | Payer: Self-pay | Source: Home / Self Care | Attending: Cardiovascular Disease

## 2012-09-19 ENCOUNTER — Inpatient Hospital Stay (HOSPITAL_COMMUNITY): Payer: Medicare Other | Admitting: Anesthesiology

## 2012-09-19 DIAGNOSIS — I2510 Atherosclerotic heart disease of native coronary artery without angina pectoris: Secondary | ICD-10-CM

## 2012-09-19 HISTORY — PX: CORONARY ARTERY BYPASS GRAFT: SHX141

## 2012-09-19 LAB — BASIC METABOLIC PANEL
BUN: 21 mg/dL (ref 6–23)
CO2: 26 mEq/L (ref 19–32)
Chloride: 105 mEq/L (ref 96–112)
GFR calc Af Amer: 90 mL/min — ABNORMAL LOW (ref >90)
Potassium: 3.3 mEq/L — ABNORMAL LOW (ref 3.5–5.1)

## 2012-09-19 LAB — CBC
HCT: 31.9 % — ABNORMAL LOW (ref 39.0–52.0)
MCH: 29.4 pg (ref 26.0–34.0)
MCHC: 34.2 g/dL (ref 30.0–36.0)
MCHC: 34.5 g/dL (ref 30.0–36.0)
MCV: 87.2 fL (ref 78.0–100.0)
Platelets: 211 10*3/uL (ref 150–400)
Platelets: 142 10*3/uL — ABNORMAL LOW (ref 150–400)
Platelets: 142 10*3/uL — ABNORMAL LOW (ref 150–400)
RBC: 3.85 MIL/uL — ABNORMAL LOW (ref 4.22–5.81)
RDW: 14.9 % (ref 11.5–15.5)
RDW: 14.9 % (ref 11.5–15.5)
WBC: 15.7 10*3/uL — ABNORMAL HIGH (ref 4.0–10.5)

## 2012-09-19 LAB — POCT I-STAT 3, ART BLOOD GAS (G3+)
Acid-base deficit: 3 mmol/L — ABNORMAL HIGH (ref 0.0–2.0)
Acid-base deficit: 3 mmol/L — ABNORMAL HIGH (ref 0.0–2.0)
Acid-base deficit: 4 mmol/L — ABNORMAL HIGH (ref 0.0–2.0)
Bicarbonate: 23.5 mEq/L (ref 20.0–24.0)
O2 Saturation: 100 %
O2 Saturation: 99 %
O2 Saturation: 98 %
TCO2: 25 mmol/L (ref 0–100)
pCO2 arterial: 40.4 mmHg (ref 35.0–45.0)
pCO2 arterial: 40 mmHg (ref 35.0–45.0)
pH, Arterial: 7.313 — ABNORMAL LOW (ref 7.350–7.450)
pH, Arterial: 7.339 — ABNORMAL LOW (ref 7.350–7.450)
pO2, Arterial: 83 mmHg (ref 80.0–100.0)

## 2012-09-19 LAB — POCT I-STAT, CHEM 8
BUN: 15 mg/dL (ref 6–23)
Calcium, Ion: 1.19 mmol/L (ref 1.13–1.30)
Chloride: 108 mEq/L (ref 96–112)
Glucose, Bld: 147 mg/dL — ABNORMAL HIGH (ref 70–99)

## 2012-09-19 LAB — POCT I-STAT 4, (NA,K, GLUC, HGB,HCT)
Glucose, Bld: 93 mg/dL (ref 70–99)
Glucose, Bld: 82 mg/dL (ref 70–99)
Glucose, Bld: 129 mg/dL — ABNORMAL HIGH (ref 70–99)
Glucose, Bld: 117 mg/dL — ABNORMAL HIGH (ref 70–99)
HCT: 32 % — ABNORMAL LOW (ref 39.0–52.0)
HCT: 28 % — ABNORMAL LOW (ref 39.0–52.0)
HCT: 33 % — ABNORMAL LOW (ref 39.0–52.0)
Hemoglobin: 11.9 g/dL — ABNORMAL LOW (ref 13.0–17.0)
Hemoglobin: 10.9 g/dL — ABNORMAL LOW (ref 13.0–17.0)
Hemoglobin: 9.5 g/dL — ABNORMAL LOW (ref 13.0–17.0)
Hemoglobin: 11.2 g/dL — ABNORMAL LOW (ref 13.0–17.0)
Potassium: 3.3 mEq/L — ABNORMAL LOW (ref 3.5–5.1)
Potassium: 3.4 mEq/L — ABNORMAL LOW (ref 3.5–5.1)
Sodium: 141 mEq/L (ref 135–145)
Sodium: 135 mEq/L (ref 135–145)

## 2012-09-19 LAB — CREATININE, SERUM
Creatinine, Ser: 0.84 mg/dL (ref 0.50–1.35)
GFR calc Af Amer: >90 mL/min (ref >90)

## 2012-09-19 LAB — HEMOGLOBIN AND HEMATOCRIT, BLOOD: HCT: 25.6 % — ABNORMAL LOW (ref 39.0–52.0)

## 2012-09-19 LAB — POCT I-STAT GLUCOSE
Glucose, Bld: 111 mg/dL — ABNORMAL HIGH (ref 70–99)
Operator id: 3406

## 2012-09-19 LAB — HEPARIN LEVEL (UNFRACTIONATED): Heparin Unfractionated: 0.39 IU/mL (ref 0.30–0.70)

## 2012-09-19 LAB — MAGNESIUM: Magnesium: 2.6 mg/dL — ABNORMAL HIGH (ref 1.5–2.5)

## 2012-09-19 SURGERY — REDO CORONARY ARTERY BYPASS GRAFTING (CABG)
Anesthesia: General | Site: Chest

## 2012-09-19 MED ORDER — SODIUM CHLORIDE 0.9 % IJ SOLN
3.0000 mL | Freq: Two times a day (BID) | INTRAMUSCULAR | Status: DC
  Administered 2012-09-20 – 2012-09-21 (×3): 3 mL via INTRAVENOUS

## 2012-09-19 MED ORDER — ACETAMINOPHEN 10 MG/ML IV SOLN
1000.0000 mg | Freq: Once | INTRAVENOUS | Status: AC
  Administered 2012-09-19: 1000 mg via INTRAVENOUS
  Filled 2012-09-19: qty 100

## 2012-09-19 MED ORDER — FENTANYL CITRATE 0.05 MG/ML IJ SOLN
INTRAMUSCULAR | Status: DC | PRN
  Administered 2012-09-19: 100 ug via INTRAVENOUS
  Administered 2012-09-19: 50 ug via INTRAVENOUS
  Administered 2012-09-19: 250 ug via INTRAVENOUS
  Administered 2012-09-19 (×3): 100 ug via INTRAVENOUS
  Administered 2012-09-19: 150 ug via INTRAVENOUS

## 2012-09-19 MED ORDER — POTASSIUM CHLORIDE 10 MEQ/50ML IV SOLN
10.0000 meq | INTRAVENOUS | Status: AC
  Administered 2012-09-19 (×3): 10 meq via INTRAVENOUS

## 2012-09-19 MED ORDER — OXYCODONE HCL 5 MG PO TABS
5.0000 mg | ORAL_TABLET | ORAL | Status: DC | PRN
  Administered 2012-09-19 – 2012-09-22 (×11): 10 mg via ORAL
  Filled 2012-09-19 (×11): qty 2

## 2012-09-19 MED ORDER — FAMOTIDINE IN NACL 20-0.9 MG/50ML-% IV SOLN
20.0000 mg | Freq: Two times a day (BID) | INTRAVENOUS | Status: AC
  Administered 2012-09-19: 20 mg via INTRAVENOUS

## 2012-09-19 MED ORDER — LACTATED RINGERS IV SOLN: INTRAVENOUS | Status: DC

## 2012-09-19 MED ORDER — SODIUM BICARBONATE 8.4 % IV SOLN
50.0000 meq | Freq: Once | INTRAVENOUS | Status: AC
  Administered 2012-09-19: 50 meq via INTRAVENOUS

## 2012-09-19 MED ORDER — ONDANSETRON HCL 4 MG/2ML IJ SOLN
4.0000 mg | Freq: Four times a day (QID) | INTRAMUSCULAR | Status: DC | PRN
  Administered 2012-09-20 – 2012-09-21 (×3): 4 mg via INTRAVENOUS
  Filled 2012-09-19 (×3): qty 2

## 2012-09-19 MED ORDER — SODIUM CHLORIDE 0.9 % IV SOLN
INTRAVENOUS | Status: DC
  Administered 2012-09-21: 08:00:00 via INTRAVENOUS

## 2012-09-19 MED ORDER — PANTOPRAZOLE SODIUM 40 MG PO TBEC
40.0000 mg | DELAYED_RELEASE_TABLET | Freq: Every day | ORAL | Status: DC
  Administered 2012-09-21 – 2012-09-22 (×2): 40 mg via ORAL
  Filled 2012-09-19 (×2): qty 1

## 2012-09-19 MED ORDER — HEPARIN SODIUM (PORCINE) 1000 UNIT/ML IJ SOLN
INTRAMUSCULAR | Status: DC | PRN
  Administered 2012-09-19: 26000 [IU] via INTRAVENOUS

## 2012-09-19 MED ORDER — ROCURONIUM BROMIDE 100 MG/10ML IV SOLN
INTRAVENOUS | Status: DC | PRN
  Administered 2012-09-19: 50 mg via INTRAVENOUS

## 2012-09-19 MED ORDER — SODIUM CHLORIDE 0.9 % IV SOLN: 250.0000 mL | INTRAVENOUS | Status: DC

## 2012-09-19 MED ORDER — MIDAZOLAM HCL 5 MG/5ML IJ SOLN
INTRAMUSCULAR | Status: DC | PRN
  Administered 2012-09-19 (×2): 3 mg via INTRAVENOUS
  Administered 2012-09-19 (×2): 2 mg via INTRAVENOUS

## 2012-09-19 MED ORDER — METOPROLOL TARTRATE 25 MG/10 ML ORAL SUSPENSION
12.5000 mg | Freq: Two times a day (BID) | ORAL | Status: DC
  Filled 2012-09-19 (×3): qty 5

## 2012-09-19 MED ORDER — INSULIN REGULAR BOLUS VIA INFUSION
0.0000 [IU] | Freq: Three times a day (TID) | INTRAVENOUS | Status: DC
  Filled 2012-09-19: qty 10

## 2012-09-19 MED ORDER — 0.9 % SODIUM CHLORIDE (POUR BTL) OPTIME
TOPICAL | Status: DC | PRN
  Administered 2012-09-19: 6000 mL

## 2012-09-19 MED ORDER — MORPHINE SULFATE 2 MG/ML IJ SOLN
1.0000 mg | INTRAMUSCULAR | Status: AC | PRN
  Administered 2012-09-19 (×2): 2 mg via INTRAVENOUS
  Filled 2012-09-19: qty 2

## 2012-09-19 MED ORDER — THROMBIN 20000 UNITS EX SOLR
CUTANEOUS | Status: AC
  Filled 2012-09-19: qty 20000

## 2012-09-19 MED ORDER — SODIUM CHLORIDE 0.9 % IJ SOLN: 3.0000 mL | INTRAMUSCULAR | Status: DC | PRN

## 2012-09-19 MED ORDER — MIDAZOLAM HCL 2 MG/2ML IJ SOLN: 2.0000 mg | INTRAMUSCULAR | Status: DC | PRN

## 2012-09-19 MED ORDER — DOCUSATE SODIUM 100 MG PO CAPS
200.0000 mg | ORAL_CAPSULE | Freq: Every day | ORAL | Status: DC
  Administered 2012-09-20 – 2012-09-21 (×2): 200 mg via ORAL
  Filled 2012-09-19: qty 2
  Filled 2012-09-19: qty 1

## 2012-09-19 MED ORDER — CALCIUM CHLORIDE 10 % IV SOLN
INTRAVENOUS | Status: DC | PRN
  Administered 2012-09-19: 500 mg via INTRAVENOUS

## 2012-09-19 MED ORDER — VECURONIUM BROMIDE 10 MG IV SOLR
INTRAVENOUS | Status: DC | PRN
  Administered 2012-09-19: 3 mg via INTRAVENOUS
  Administered 2012-09-19: 5 mg via INTRAVENOUS

## 2012-09-19 MED ORDER — THROMBIN 20000 UNITS EX SOLR
OROMUCOSAL | Status: DC | PRN
  Administered 2012-09-19 (×3): via TOPICAL

## 2012-09-19 MED ORDER — DEXMEDETOMIDINE HCL IN NACL 200 MCG/50ML IV SOLN: 0.1000 ug/kg/h | INTRAVENOUS | Status: DC

## 2012-09-19 MED ORDER — SODIUM CHLORIDE 0.9 % IV SOLN
10.0000 mg | INTRAVENOUS | Status: DC | PRN
  Administered 2012-09-19: 10 ug/min via INTRAVENOUS

## 2012-09-19 MED ORDER — BISACODYL 10 MG RE SUPP: 10.0000 mg | Freq: Every day | RECTAL | Status: DC

## 2012-09-19 MED ORDER — LACTATED RINGERS IV SOLN
INTRAVENOUS | Status: DC | PRN
  Administered 2012-09-19 (×2): via INTRAVENOUS

## 2012-09-19 MED ORDER — METOPROLOL TARTRATE 12.5 MG HALF TABLET
12.5000 mg | ORAL_TABLET | Freq: Two times a day (BID) | ORAL | Status: DC
  Administered 2012-09-20 – 2012-09-22 (×6): 12.5 mg via ORAL
  Filled 2012-09-19 (×9): qty 1

## 2012-09-19 MED ORDER — MORPHINE SULFATE 2 MG/ML IJ SOLN
2.0000 mg | INTRAMUSCULAR | Status: DC | PRN
  Administered 2012-09-19 (×2): 4 mg via INTRAVENOUS
  Administered 2012-09-20 (×2): 2 mg via INTRAVENOUS
  Administered 2012-09-20 (×5): 4 mg via INTRAVENOUS
  Filled 2012-09-19 (×2): qty 2
  Filled 2012-09-19: qty 1
  Filled 2012-09-19 (×5): qty 2
  Filled 2012-09-19: qty 1

## 2012-09-19 MED ORDER — ASPIRIN EC 325 MG PO TBEC
325.0000 mg | DELAYED_RELEASE_TABLET | Freq: Every day | ORAL | Status: DC
  Administered 2012-09-20 – 2012-09-22 (×3): 325 mg via ORAL
  Filled 2012-09-19 (×4): qty 1

## 2012-09-19 MED ORDER — SODIUM CHLORIDE 0.45 % IV SOLN: INTRAVENOUS | Status: DC

## 2012-09-19 MED ORDER — METOPROLOL TARTRATE 1 MG/ML IV SOLN: 2.5000 mg | INTRAVENOUS | Status: DC | PRN

## 2012-09-19 MED ORDER — LACTATED RINGERS IV SOLN: 500.0000 mL | Freq: Once | INTRAVENOUS | Status: AC | PRN

## 2012-09-19 MED ORDER — LIDOCAINE HCL (CARDIAC) 20 MG/ML IV SOLN
INTRAVENOUS | Status: DC | PRN
  Administered 2012-09-19 (×2): 50 mg via INTRAVENOUS

## 2012-09-19 MED ORDER — PROTAMINE SULFATE 10 MG/ML IV SOLN
INTRAVENOUS | Status: DC | PRN
  Administered 2012-09-19: 250 mg via INTRAVENOUS

## 2012-09-19 MED ORDER — VANCOMYCIN HCL IN DEXTROSE 1-5 GM/200ML-% IV SOLN
1000.0000 mg | Freq: Once | INTRAVENOUS | Status: AC
  Administered 2012-09-19: 1000 mg via INTRAVENOUS
  Filled 2012-09-19: qty 200

## 2012-09-19 MED ORDER — LACTATED RINGERS IV SOLN
INTRAVENOUS | Status: DC | PRN
  Administered 2012-09-19: 07:00:00 via INTRAVENOUS

## 2012-09-19 MED ORDER — BISACODYL 5 MG PO TBEC
10.0000 mg | DELAYED_RELEASE_TABLET | Freq: Every day | ORAL | Status: DC
  Administered 2012-09-20 – 2012-09-22 (×3): 10 mg via ORAL
  Filled 2012-09-19: qty 1
  Filled 2012-09-19 (×2): qty 2
  Filled 2012-09-19: qty 1

## 2012-09-19 MED ORDER — ASPIRIN 81 MG PO CHEW: 324.0000 mg | CHEWABLE_TABLET | Freq: Every day | ORAL | Status: DC

## 2012-09-19 MED ORDER — ALBUMIN HUMAN 5 % IV SOLN: 250.0000 mL | INTRAVENOUS | Status: AC | PRN

## 2012-09-19 MED ORDER — DEXTROSE 5 % IV SOLN
1.5000 g | Freq: Two times a day (BID) | INTRAVENOUS | Status: AC
  Administered 2012-09-20 – 2012-09-21 (×4): 1.5 g via INTRAVENOUS
  Filled 2012-09-19 (×4): qty 1.5

## 2012-09-19 MED ORDER — EPHEDRINE SULFATE 50 MG/ML IJ SOLN
INTRAMUSCULAR | Status: DC | PRN
  Administered 2012-09-19 (×2): 10 mg via INTRAVENOUS

## 2012-09-19 MED ORDER — HEMOSTATIC AGENTS (NO CHARGE) OPTIME
TOPICAL | Status: DC | PRN
  Administered 2012-09-19: 1 via TOPICAL

## 2012-09-19 MED ORDER — ACETAMINOPHEN 160 MG/5ML PO SOLN: 975.0000 mg | Freq: Four times a day (QID) | ORAL | Status: DC

## 2012-09-19 MED ORDER — ACETAMINOPHEN 500 MG PO TABS
1000.0000 mg | ORAL_TABLET | Freq: Four times a day (QID) | ORAL | Status: DC
  Administered 2012-09-20 – 2012-09-21 (×7): 1000 mg via ORAL
  Filled 2012-09-19 (×18): qty 2

## 2012-09-19 MED ORDER — MAGNESIUM SULFATE 40 MG/ML IJ SOLN
4.0000 g | Freq: Once | INTRAMUSCULAR | Status: AC
  Administered 2012-09-19: 4 g via INTRAVENOUS
  Filled 2012-09-19: qty 100

## 2012-09-19 MED ORDER — PHENYLEPHRINE HCL 10 MG/ML IJ SOLN
0.0000 ug/min | INTRAVENOUS | Status: DC
  Filled 2012-09-19: qty 2

## 2012-09-19 MED ORDER — SODIUM CHLORIDE 0.9 % IV SOLN
INTRAVENOUS | Status: DC
  Administered 2012-09-19: 0.6 [IU]/h via INTRAVENOUS
  Filled 2012-09-19: qty 1

## 2012-09-19 MED ORDER — PROPOFOL 10 MG/ML IV BOLUS
INTRAVENOUS | Status: DC | PRN
  Administered 2012-09-19: 70 mg via INTRAVENOUS

## 2012-09-19 MED ORDER — NITROGLYCERIN IN D5W 200-5 MCG/ML-% IV SOLN: 0.0000 ug/min | INTRAVENOUS | Status: DC

## 2012-09-19 SURGICAL SUPPLY — 92 items
ADAPTER CARDIO PERF ANTE/RETRO (ADAPTER) ×2 IMPLANT
ATTRACTOMAT 16X20 MAGNETIC DRP (DRAPES) ×2 IMPLANT
BAG DECANTER FOR FLEXI CONT (MISCELLANEOUS) ×2 IMPLANT
BANDAGE ELASTIC 4 VELCRO ST LF (GAUZE/BANDAGES/DRESSINGS) ×2 IMPLANT
BANDAGE ELASTIC 6 VELCRO ST LF (GAUZE/BANDAGES/DRESSINGS) ×2 IMPLANT
BANDAGE GAUZE ELAST BULKY 4 IN (GAUZE/BANDAGES/DRESSINGS) ×2 IMPLANT
BLADE OSCILLATING /SAGITTAL (BLADE) ×2 IMPLANT
BLADE SURG 11 STRL SS (BLADE) ×4 IMPLANT
BLADE SURG ROTATE 9660 (MISCELLANEOUS) IMPLANT
CANISTER SUCTION 2500CC (MISCELLANEOUS) ×2 IMPLANT
CANNULA GUNDRY RCSP 15FR (MISCELLANEOUS) ×2 IMPLANT
CATH ROBINSON RED A/P 18FR (CATHETERS) ×8 IMPLANT
CATH THORACIC 28FR (CATHETERS) ×2 IMPLANT
CATH THORACIC 36FR (CATHETERS) ×2 IMPLANT
CATH THORACIC 36FR RT ANG (CATHETERS) ×2 IMPLANT
CLIP FOGARTY SPRING 6M (CLIP) IMPLANT
CLIP RETRACTION 3.0MM CORONARY (MISCELLANEOUS) ×2 IMPLANT
CLIP TI MEDIUM 24 (CLIP) IMPLANT
CLIP TI WIDE RED SMALL 24 (CLIP) IMPLANT
CLOTH BEACON ORANGE TIMEOUT ST (SAFETY) ×2 IMPLANT
COVER SURGICAL LIGHT HANDLE (MISCELLANEOUS) ×4 IMPLANT
CRADLE DONUT ADULT HEAD (MISCELLANEOUS) ×2 IMPLANT
DRAPE CARDIOVASCULAR INCISE (DRAPES) ×1
DRAPE SLUSH MACHINE 52X66 (DRAPES) IMPLANT
DRAPE SLUSH/WARMER DISC (DRAPES) ×2 IMPLANT
DRAPE SRG 135X102X78XABS (DRAPES) ×1 IMPLANT
DRSG COVADERM 4X14 (GAUZE/BANDAGES/DRESSINGS) ×2 IMPLANT
ELECT CAUTERY BLADE 6.4 (BLADE) ×2 IMPLANT
ELECT REM PT RETURN 9FT ADLT (ELECTROSURGICAL) ×4
ELECTRODE REM PT RTRN 9FT ADLT (ELECTROSURGICAL) ×2 IMPLANT
GLOVE BIO SURGEON STRL SZ 6 (GLOVE) ×6 IMPLANT
GLOVE BIO SURGEON STRL SZ 6.5 (GLOVE) IMPLANT
GLOVE BIO SURGEON STRL SZ7 (GLOVE) IMPLANT
GLOVE BIO SURGEON STRL SZ7.5 (GLOVE) IMPLANT
GLOVE BIOGEL PI IND STRL 6 (GLOVE) IMPLANT
GLOVE BIOGEL PI IND STRL 6.5 (GLOVE) ×1 IMPLANT
GLOVE BIOGEL PI IND STRL 7.0 (GLOVE) ×2 IMPLANT
GLOVE BIOGEL PI INDICATOR 6 (GLOVE)
GLOVE BIOGEL PI INDICATOR 6.5 (GLOVE) ×1
GLOVE BIOGEL PI INDICATOR 7.0 (GLOVE) ×2
GLOVE EUDERMIC 7 POWDERFREE (GLOVE) ×4 IMPLANT
GLOVE ORTHO TXT STRL SZ7.5 (GLOVE) IMPLANT
GLOVE SURG SS PI 6.5 STRL IVOR (GLOVE) ×4 IMPLANT
GOWN PREVENTION PLUS XLARGE (GOWN DISPOSABLE) ×2 IMPLANT
GOWN STRL NON-REIN LRG LVL3 (GOWN DISPOSABLE) ×12 IMPLANT
HEMOSTAT POWDER SURGIFOAM 1G (HEMOSTASIS) ×6 IMPLANT
HEMOSTAT SURGICEL 2X14 (HEMOSTASIS) ×2 IMPLANT
INSERT FOGARTY 61MM (MISCELLANEOUS) IMPLANT
INSERT FOGARTY XLG (MISCELLANEOUS) IMPLANT
KIT BASIN OR (CUSTOM PROCEDURE TRAY) ×2 IMPLANT
KIT CATH CPB BARTLE (MISCELLANEOUS) ×2 IMPLANT
KIT ROOM TURNOVER OR (KITS) ×2 IMPLANT
KIT SUCTION CATH 14FR (SUCTIONS) ×4 IMPLANT
KIT VASOVIEW W/TROCAR VH 2000 (KITS) ×2 IMPLANT
NS IRRIG 1000ML POUR BTL (IV SOLUTION) ×12 IMPLANT
PACK OPEN HEART (CUSTOM PROCEDURE TRAY) ×2 IMPLANT
PAD ARMBOARD 7.5X6 YLW CONV (MISCELLANEOUS) ×4 IMPLANT
PAD DEFIB R2 (MISCELLANEOUS) ×2 IMPLANT
PENCIL BUTTON HOLSTER BLD 10FT (ELECTRODE) ×2 IMPLANT
PUNCH AORTIC ROTATE 4.0MM (MISCELLANEOUS) IMPLANT
PUNCH AORTIC ROTATE 4.5MM 8IN (MISCELLANEOUS) ×2 IMPLANT
PUNCH AORTIC ROTATE 5MM 8IN (MISCELLANEOUS) IMPLANT
SEALANT SURG COSEAL 4ML (VASCULAR PRODUCTS) ×2 IMPLANT
SET CARDIOPLEGIA MPS 5001102 (MISCELLANEOUS) ×2 IMPLANT
SOLUTION ANTI FOG 6CC (MISCELLANEOUS) ×2 IMPLANT
SPONGE GAUZE 4X4 12PLY (GAUZE/BANDAGES/DRESSINGS) ×4 IMPLANT
SPONGE INTESTINAL PEANUT (DISPOSABLE) IMPLANT
SPONGE LAP 18X18 X RAY DECT (DISPOSABLE) ×2 IMPLANT
SPONGE LAP 4X18 X RAY DECT (DISPOSABLE) ×2 IMPLANT
SUT BONE WAX W31G (SUTURE) ×2 IMPLANT
SUT MNCRL AB 4-0 PS2 18 (SUTURE) ×2 IMPLANT
SUT PROLENE 3 0 SH DA (SUTURE) IMPLANT
SUT PROLENE 3 0 SH1 36 (SUTURE) ×2 IMPLANT
SUT PROLENE 4 0 RB 1 (SUTURE) ×1
SUT PROLENE 4-0 RB1 .5 CRCL 36 (SUTURE) ×1 IMPLANT
SUT PROLENE 7 0 BV1 MDA (SUTURE) ×2 IMPLANT
SUT PROLENE BLUE 7 0 (SUTURE) IMPLANT
SUT STEEL SZ 6 DBL 3X14 BALL (SUTURE) ×6 IMPLANT
SUT VIC AB 1 CTX 36 (SUTURE) ×2
SUT VIC AB 1 CTX36XBRD ANBCTR (SUTURE) ×2 IMPLANT
SUT VIC AB 2-0 CT1 27 (SUTURE) ×1
SUT VIC AB 2-0 CT1 TAPERPNT 27 (SUTURE) ×1 IMPLANT
SUTURE E-PAK OPEN HEART (SUTURE) ×2 IMPLANT
SYSTEM SAHARA CHEST DRAIN ATS (WOUND CARE) ×2 IMPLANT
TAPE CLOTH SURG 4X10 WHT LF (GAUZE/BANDAGES/DRESSINGS) ×2 IMPLANT
TOWEL OR 17X24 6PK STRL BLUE (TOWEL DISPOSABLE) ×2 IMPLANT
TOWEL OR 17X26 10 PK STRL BLUE (TOWEL DISPOSABLE) ×2 IMPLANT
TRAY FOLEY IC TEMP SENS 14FR (CATHETERS) ×2 IMPLANT
TUBE SUCT INTRACARD DLP 20F (MISCELLANEOUS) ×2 IMPLANT
TUBING INSUFFLATION 10FT LAP (TUBING) ×2 IMPLANT
UNDERPAD 30X30 INCONTINENT (UNDERPADS AND DIAPERS) ×2 IMPLANT
WATER STERILE IRR 1000ML POUR (IV SOLUTION) ×4 IMPLANT

## 2012-09-19 NOTE — Anesthesia Postprocedure Evaluation (Signed)
  Anesthesia Post-op Note  Patient: [name]  Procedure(s) Performed: Procedure(s) (LRB) with comments: REDO CORONARY ARTERY BYPASS GRAFTING (CABG) (N/A)  Patient Location: SICU  Anesthesia Type:General  Level of Consciousness: sedated and Patient remains intubated per anesthesia plan  Airway and Oxygen Therapy: Patient remains intubated per anesthesia plan and Patient placed on Ventilator (see vital sign flow sheet for setting)  Post-op Pain: none  Post-op Assessment: Post-op Vital signs reviewed, Patient's Cardiovascular Status Stable, Respiratory Function Stable, Patent Airway, No signs of Nausea or vomiting and Pain level controlled  Post-op Vital Signs: stable  Complications: No apparent anesthesia complications

## 2012-09-19 NOTE — OR Nursing (Signed)
2300 called at 1217 for 20 minute call.  Spoke with Nurse, learning disability.

## 2012-09-19 NOTE — Progress Notes (Signed)
[facility] BRIEF SICU PROGRESS NOTE  Day of Surgery  S/P Procedure(s) (LRB): REDO CORONARY ARTERY BYPASS GRAFTING (CABG) (N/A)   Just extubated, neuro grossly intact Apaced w/ stable hemodynamics Chest tube output low UOP adequate Labs okay  Plan: Continue routine early postop  Laryn Venning H 09/19/2012 5:12 PM

## 2012-09-19 NOTE — Preoperative (Signed)
Beta Blockers   Reason not to administer Beta Blockers:Not Applicable 

## 2012-09-19 NOTE — Procedures (Signed)
Extubation Procedure Note  Patient Details:   Name: [name] DOB: May 12, 1946 MRN: [id number]   Airway Documentation:     Evaluation  O2 sats: stable throughout Complications: No apparent complications Patient did tolerate procedure well. Bilateral Breath Sounds: Rhonchi;Diminished   Yes  Pt achieved around VC, -40 NIF and was positive for cuff leak. Pt extubated to 4lpm [name], no strider heard, and is resting comfortably at this time. Vitals are within normal limits. RT will continue to monitor.   Parke Poisson 09/19/2012, 5:00 PM

## 2012-09-19 NOTE — OR Nursing (Signed)
2300 called at 1150 for on the way call.

## 2012-09-19 NOTE — Transfer of Care (Signed)
Immediate Anesthesia Transfer of Care Note  Patient: [name]  Procedure(s) Performed: Procedure(s) (LRB) with comments: REDO CORONARY ARTERY BYPASS GRAFTING (CABG) (N/A)  Patient Location: SICU  Anesthesia Type:General  Level of Consciousness: sedated and unresponsive  Airway & Oxygen Therapy: Patient remains intubated per anesthesia plan  Post-op Assessment: Report given to PACU RN  Post vital signs: Reviewed and stable  Complications: No apparent anesthesia complications

## 2012-09-19 NOTE — OR Nursing (Signed)
Volunteer desk called at 1155 to notify family off pump.

## 2012-09-19 NOTE — OR Nursing (Signed)
Volunteer desk called at 0840 to notify family of surgery start; spoke with Radiation protection practitioner.

## 2012-09-19 NOTE — Brief Op Note (Signed)
09/10/2012 - 09/19/2012  11:18 AM  PATIENT:  [name]  66 y.o. male  PRE-OPERATIVE DIAGNOSIS:  CAD  POST-OPERATIVE DIAGNOSIS:  Coronary Artery Disease  PROCEDURE:  Procedure(s) (LRB) with comments:  REDO CORONARY ARTERY BYPASS GRAFTING x1 -SVG to OM  ENDOSCOPIC SAPHENOUS VEIN HARVEST LEFT THIGH (Small Caliber)  SURGEON:  Surgeon(s) and Role:    * Alleen Borne, MD - Primary  PHYSICIAN ASSISTANT: Alayiah Fontes PA-C  ANESTHESIA:   general  EBL:  Total I/O In: 1100 [I.V.:1100] Out: 700 [Urine:700]  BLOOD ADMINISTERED: CC CELLSAVER  DRAINS: Medistinal chest drains   LOCAL MEDICATIONS USED:  NONE  SPECIMEN:  No Specimen  DISPOSITION OF SPECIMEN:  N/A  COUNTS:  YES  TOURNIQUET:  * No tourniquets in log *  DICTATION: .Dragon Dictation  PLAN OF CARE: Admit to inpatient   PATIENT DISPOSITION:  ICU - intubated and hemodynamically stable.   Delay start of Pharmacological VTE agent (>24hrs) due to surgical blood loss or risk of bleeding: yes

## 2012-09-19 NOTE — Anesthesia Preprocedure Evaluation (Addendum)
Anesthesia Evaluation  Patient identified by MRN, date of birth, ID band Patient awake    Reviewed: Allergy & Precautions, H&P , NPO status , Patient's Chart, lab work & pertinent test results  Airway Mallampati: I TM Distance: >3 FB Neck ROM: full    Dental  (+) Edentulous Upper and Edentulous Lower   Pulmonary          Cardiovascular hypertension, + angina + CAD, + Past MI, + Cardiac Stents and + CABG Rhythm:regular Rate:Normal     Neuro/Psych PSYCHIATRIC DISORDERS Anxiety    GI/Hepatic   Endo/Other    Renal/GU      Musculoskeletal   Abdominal   Peds  Hematology   Anesthesia Other Findings   Reproductive/Obstetrics                          Anesthesia Physical Anesthesia Plan  ASA: III  Anesthesia Plan: General   Post-op Pain Management:    Induction: Intravenous  Airway Management Planned: Oral ETT  Additional Equipment: Arterial line, CVP, PA Cath and TEE  Intra-op Plan:   Post-operative Plan: Post-operative intubation/ventilation  Informed Consent: I have reviewed the patients History and Physical, chart, labs and discussed the procedure including the risks, benefits and alternatives for the proposed anesthesia with the patient or authorized representative who has indicated his/her understanding and acceptance.     Plan Discussed with: CRNA, Surgeon and Anesthesiologist  Anesthesia Plan Comments:         Anesthesia Quick Evaluation

## 2012-09-20 ENCOUNTER — Inpatient Hospital Stay (HOSPITAL_COMMUNITY): Payer: Medicare Other

## 2012-09-20 DIAGNOSIS — E785 Hyperlipidemia, unspecified: Secondary | ICD-10-CM | POA: Diagnosis present

## 2012-09-20 DIAGNOSIS — N400 Enlarged prostate without lower urinary tract symptoms: Secondary | ICD-10-CM | POA: Diagnosis present

## 2012-09-20 LAB — GLUCOSE, CAPILLARY
Glucose-Capillary: 152 mg/dL — ABNORMAL HIGH (ref 70–99)
Glucose-Capillary: 138 mg/dL — ABNORMAL HIGH (ref 70–99)
Glucose-Capillary: 108 mg/dL — ABNORMAL HIGH (ref 70–99)
Glucose-Capillary: 122 mg/dL — ABNORMAL HIGH (ref 70–99)

## 2012-09-20 LAB — CBC
HCT: 33.7 % — ABNORMAL LOW (ref 39.0–52.0)
HCT: 35.1 % — ABNORMAL LOW (ref 39.0–52.0)
Hemoglobin: 11.8 g/dL — ABNORMAL LOW (ref 13.0–17.0)
MCH: 29.6 pg (ref 26.0–34.0)
MCHC: 34.4 g/dL (ref 30.0–36.0)
MCHC: 33.6 g/dL (ref 30.0–36.0)
MCV: 88.2 fL (ref 78.0–100.0)
Platelets: 145 10*3/uL — ABNORMAL LOW (ref 150–400)
RDW: 15 % (ref 11.5–15.5)

## 2012-09-20 LAB — BASIC METABOLIC PANEL
BUN: 15 mg/dL (ref 6–23)
Creatinine, Ser: 0.81 mg/dL (ref 0.50–1.35)
GFR calc Af Amer: >90 mL/min (ref >90)
GFR calc non Af Amer: >90 mL/min (ref >90)
Potassium: 3.6 mEq/L (ref 3.5–5.1)

## 2012-09-20 LAB — POCT I-STAT, CHEM 8
HCT: 37 % — ABNORMAL LOW (ref 39.0–52.0)
Hemoglobin: 12.6 g/dL — ABNORMAL LOW (ref 13.0–17.0)
Potassium: 4 mEq/L (ref 3.5–5.1)
Sodium: 138 mEq/L (ref 135–145)

## 2012-09-20 LAB — MAGNESIUM: Magnesium: 2.4 mg/dL (ref 1.5–2.5)

## 2012-09-20 MED ORDER — FUROSEMIDE 10 MG/ML IJ SOLN
20.0000 mg | Freq: Four times a day (QID) | INTRAMUSCULAR | Status: AC
  Administered 2012-09-20 – 2012-09-21 (×3): 20 mg via INTRAVENOUS
  Filled 2012-09-20: qty 2

## 2012-09-20 MED ORDER — POTASSIUM CHLORIDE 10 MEQ/50ML IV SOLN
10.0000 meq | INTRAVENOUS | Status: AC
  Administered 2012-09-20 (×3): 10 meq via INTRAVENOUS

## 2012-09-20 MED ORDER — INSULIN ASPART 100 UNIT/ML ~~LOC~~ SOLN
0.0000 [IU] | SUBCUTANEOUS | Status: DC
  Administered 2012-09-20: 2 [IU] via SUBCUTANEOUS

## 2012-09-20 MED ORDER — FUROSEMIDE 10 MG/ML IJ SOLN
20.0000 mg | Freq: Once | INTRAMUSCULAR | Status: AC
  Administered 2012-09-20: 20 mg via INTRAVENOUS
  Filled 2012-09-20: qty 2

## 2012-09-20 MED ORDER — INSULIN ASPART 100 UNIT/ML ~~LOC~~ SOLN: 0.0000 [IU] | SUBCUTANEOUS | Status: DC

## 2012-09-20 MED ORDER — INSULIN ASPART 100 UNIT/ML ~~LOC~~ SOLN
0.0000 [IU] | SUBCUTANEOUS | Status: DC
  Administered 2012-09-20 – 2012-09-21 (×3): 2 [IU] via SUBCUTANEOUS

## 2012-09-20 MED ORDER — TERAZOSIN HCL 5 MG PO CAPS
5.0000 mg | ORAL_CAPSULE | Freq: Every day | ORAL | Status: DC
  Administered 2012-09-20 – 2012-09-22 (×3): 5 mg via ORAL
  Filled 2012-09-20 (×4): qty 1

## 2012-09-20 MED ORDER — ALUM & MAG HYDROXIDE-SIMETH 200-200-20 MG/5ML PO SUSP
30.0000 mL | Freq: Four times a day (QID) | ORAL | Status: DC | PRN
  Administered 2012-09-20 – 2012-09-22 (×5): 30 mL via ORAL
  Filled 2012-09-20 (×5): qty 30

## 2012-09-20 MED FILL — Potassium Chloride Inj 2 mEq/ML: INTRAVENOUS | Qty: 40 | Status: AC

## 2012-09-20 MED FILL — Magnesium Sulfate Inj 50%: INTRAMUSCULAR | Qty: 10 | Status: AC

## 2012-09-20 NOTE — Op Note (Signed)
[name], [name] NO.:  [id number]  MEDICAL RECORD NO.:  [id number]  LOCATION:  2308                         FACILITY:  [facility]  PHYSICIAN:  [name], M.D.     DATE OF BIRTH:  08-Jan-1946  DATE OF PROCEDURE:  09/19/2012 DATE OF DISCHARGE:                              OPERATIVE REPORT   PREOPERATIVE DIAGNOSIS:  Occluded left main and three-vessel coronary artery disease, status post coronary artery bypass graft surgery in January 2013.  POSTOPERATIVE DIAGNOSIS:  Occluded left main and three-vessel coronary artery disease, status post coronary artery bypass graft surgery in January 2013.  OPERATIVE PROCEDURE:  Redo median sternotomy, extracorporeal circulation, coronary artery bypass graft surgery x1 using a saphenous vein graft to the obtuse marginal branch of left circumflex coronary artery.  Endoscopic vein harvesting from the left leg.  ATTENDING SURGEON:  [name], M.D.  ASSISTANT:  Lowella Dandy, PA-C.  ANESTHESIA:  General endotracheal.  CLINICAL HISTORY:  This patient is a 66 year old gentleman with history of hypertension, hypercholesterolemia, previous smoking, and coronary artery disease, who underwent coronary artery bypass x3 by me in January 2013.  At initial surgery, he had 99% ostial left main stenosis, and required emergent surgery.  He had a left internal mammary graft to the LAD, a saphenous vein graft to the obtuse marginal branch and to the right coronary artery.  He did well initially, but then was admitted a couple of weeks ago with unstable angina and cardiac catheterization showed occlusion of the saphenous vein graft to the obtuse marginal branch.  The left main was occluded at the ostium and the obtuse marginal was supplied by retrograde flow from the LAD that was receiving its flow from the left internal mammary graft.  Unfortunately, there was a mid 80% stenosis in the proximal LAD that was compromised in  its retrograde flow back around to the left circumflex.  The left internal mammary and saphenous vein graft to the right coronary artery were patent.  The patient wanted to try medical therapy first and was started on Brilinta and sent home.  He continued having unstable angina at home and was therefore readmitted for surgery.  His Brilinta was stopped and we waited about 1 week to do surgery due to scheduling conflicts.  I discussed the operative procedure of redo coronary bypass graft surgery with the patient and his wife.  I discussed alternatives, benefits, and risks including, but not limited to bleeding, blood transfusion, infection, stroke, myocardial infarction, graft failure, organ dysfunction, and death.  He understood all this and agreed to proceed.  OPERATIVE PROCEDURE:  The patient was taken to the operating room and placed on the table in supine position.  After induction of general endotracheal anesthesia, a Foley catheter was placed in bladder using sterile technique.  Then, the chest, abdomen, and both lower extremities were prepped and draped in the usual sterile manner.  The chest was reentered through the previous median sternotomy incision.  The sternum was opened using the oscillating saw without difficulty.  The sternal edges were retracted and the heart was dissected from the back of the sternum.  The chest retractor was then placed.  Then,  the right atrium and ascending aorta were exposed by dividing the pericardial adhesions. While we are doing this the physician assistant, harvested a piece of saphenous vein from the left thigh using endoscopic vein harvest technique.  This vein was of small to medium caliber, but good quality. I felt that it would match the size of the artery well.  Then, the patient was heparinized when an adequate ACT was obtained. The distal ascending aorta was cannulated using a 20-French aortic cannula for arterial inflow.  Venous  outflow was achieved using a 2- stage venous cannula placed through a pursestring suture in the right atrium.  An antegrade cardioplegia and vent cannula was inserted in the aortic root.  A retrograde cardioplegic cannula was inserted through a pursestring suture in the right atrium and advanced in the coronary sinus without difficulty.  The patient was then placed on cardiopulmonary bypass, and further pericardial adhesions were dissected.  The LAD was exposed and traced proximally until I located the left internal mammary pedicle as it entered the pericardial cavity.  This was a large vessel.  It was carefully encircled without injury.  Then, the pulmonary artery and aorta were separated.  The patient was then cooled to 32 degrees centigrade.  The aorta was crossclamped and 500 mL of cold blood antegrade cardioplegia was administered into the aortic root with quick arrest of the heart.  This was followed by 500 mL of cold blood retrograde cardioplegia.  Myocardial temperature was measured with a septal probe and decreased to about 10 degrees centigrade.  We gave additional doses of antegrade and retrograde cardioplegia at about 20- minute intervals throughout the cross-clamp time.  Then, the remainder of the heart was dissected free from the pericardium by dividing the adhesions sharply.  Exposure was somewhat difficult due to the left internal mammary artery pedicle coming into the pericardium and the patient's relatively compact size.  I was gradually able to immobilize the lateral wall of the heart and get it up high enough to be able to see the obtuse marginal branch.  The old vein graft was identified and was diffusely thickened.  Just beyond a prior distal anastomosis, the main part of the obtuse marginal branch was diseased with calcific plaque.  This extended down to the trifurcation into the 3 subbranches.  I felt the only way to perform a graft to this vessel would be to  graft on the subbranches which were significantly smaller than the main body of the obtuse marginal.  The most medial subbranch was the largest.  This became intramyocardial and I was able to trace down into the muscle and adequately expose it.  The vessel was opened, and the internal diameter was about 1.6 mm.  The conduit used was the segment of greater saphenous vein and the anastomosis performed in a end- to-side manner using continuous 7-0 Prolene suture.  Flow was noted through the graft and was good.  Then, the patient rewarmed to 37 degrees centigrade.  This vein graft was then brought through a space beneath the left internal mammary pedicle as it entered the pericardium. The proximal end of this vein graft was anastomosed to the mid ascending aorta in an end-to-side manner using continuous 6-0 Prolene suture. Then, the clamp was removed from mammary pedicle.  There was rapid warming of the ventricular septum and a return of spontaneous ventricular fibrillation.  The crossclamp was removed with a time of 85 minutes.  The patient spontaneously converted to sinus rhythm.  The  proximal and distal anastomoses appeared hemostatic.  A graft marker was placed around the proximal anastomosis.  Two temporary right ventricular and right atrial pacing wires were placed and brought through the skin. When the patient rewarmed to 37 degrees centigrade, he was weaned from cardiopulmonary bypass on no inotropic agents.  Total bypass time was 136 minutes.  Cardiac function appeared good with a cardiac output of 5 liters/minute.  His electrocardiogram appeared normal.  Then, protamine was given and the venous and aortic cannulas were removed without difficulty.  Hemostasis was achieved without difficulty.  Three chest tubes were placed with 2 in the posterior pericardium, 1 in anterior mediastinum, and 1 in left pleural space, which had been opened.  The sternum was then closed with double #6  stainless steel wires.  Fascia was closed with continuous #1 Vicryl suture.  Subcutaneous tissue was closed with continuous 2-0 Vicryl, and the skin with a 3-0 Vicryl subcuticular closure.  The lower extremity vein harvest site was closed in layers in a similar manner.  The sponge, needle, and instrument counts were correct according to the scrub nurse. Dry sterile dressing was applied over the incisions around chest tubes, which were hooked to Pleur-Evac suction.  The patient remained hemodynamically stable and was transported to the SICU in guarded, but stable condition.     [name], M.D.     BB/MEDQ  D:  09/19/2012  T:  09/20/2012  Job:  [id number]  cc:   Nicki Guadalajara, M.D.

## 2012-09-20 NOTE — Progress Notes (Signed)
Subjective:  Up in chair  Objective:  Vital Signs in the last 24 hours: Temp:  [97.7 F (36.5 C)-98.6 F (37 C)] 98.6 F (37 C) (11/15 1000) Pulse Rate:  [76-86] 83  (11/15 1100) Resp:  [12-27] 26  (11/15 1100) BP: (82-146)/(56-80) 139/70 mmHg (11/15 1100) SpO2:  [90 %-100 %] 94 % (11/15 1100) Arterial Line BP: (89-137)/(51-75) 137/66 mmHg (11/15 0900) FiO2 (%):  [40 %-50 %] 40 % (11/14 1610) Weight:  [88.4 kg (194 lb 14.2 oz)] 88.4 kg (194 lb 14.2 oz) (11/15 0500)  Intake/Output from previous day:  Intake/Output Summary (Last 24 hours) at 09/20/12 1131 Last data filed at 09/20/12 1100  Gross per 24 hour  Intake 3944.95 ml  Output   3465 ml  Net 479.95 ml    Physical Exam: General appearance: alert, cooperative and no distress Lungs: decreased breath sounds at bases Heart: regular rate and rhythm   Rate: 80  Rhythm: normal sinus rhythm  Lab Results:  Basename 09/20/12 0357 09/19/12 2013 09/19/12 1957  WBC 11.8* -- 15.7*  HGB 11.6* 10.9* --  PLT 145* -- 142*    Basename 09/20/12 0357 09/19/12 2013 09/19/12 0419  NA 132* 139 --  K 3.6 4.1 --  CL 103 108 --  CO2 22 -- 26  GLUCOSE 126* 147* --  BUN 15 15 --  CREATININE 0.81 1.00 --   No results found for this basename: TROPONINI:2,CK,MB:2 in the last 72 hours Hepatic Function Panel No results found for this basename: PROT,ALBUMIN,AST,ALT,ALKPHOS,BILITOT,BILIDIR,IBILI in the last 72 hours No results found for this basename: CHOL in the last 72 hours  Basename 09/19/12 1330  INR 1.31    Imaging: Imaging results have been reviewed  Cardiac Studies:  Assessment/Plan:   Principal Problem:  *Unstable angina, on appropriate medical therapy, re-do CABG 09/19/12 X1 with SVG-OM 09/19/12 Active Problems:  CAD, CABG X 09 Nov 2011.  NSTEMI 08/29/12 secondary to occluded LM  Smoker  HTN (hypertension)  ICM, EF 40% Jan 2013, now 50-55% Oct 2013  Dyslipidemia  BPH (benign prostatic hyperplasia)  Plan- Per  CVTS, 20mg  Lasix IV ordered today. We will follow along.    Corine Shelter PA-C 09/20/2012, 11:31 AM  Doing well early postop, will follow with surgery team.  Thurmon Fair, MD, [facility] [phone] office [phone] pager

## 2012-09-20 NOTE — Progress Notes (Addendum)
[facility] DAILY PROGRESS NOTE                   [address]            [place] [id number]          [phone]      1 Day Post-Op Procedure(s) (LRB): REDO CORONARY ARTERY BYPASS GRAFTING (CABG) (N/A)  Total Length of Stay:  LOS: 10 days   Subjective: Some acid indigestion  Objective: Vital signs in last 24 hours: Temp:  [97.7 F (36.5 C)-98.6 F (37 C)] 98.6 F (37 C) (11/15 0700) Pulse Rate:  [76-80] 76  (11/15 0700) Cardiac Rhythm:  [-] Normal sinus rhythm (11/15 0600) Resp:  [12-25] 18  (11/15 0700) BP: (82-139)/(56-77) 92/65 mmHg (11/14 1800) SpO2:  [92 %-100 %] 93 % (11/15 0700) Arterial Line BP: (89-131)/(51-75) 124/56 mmHg (11/15 0700) FiO2 (%):  [40 %-50 %] 40 % (11/14 1610) Weight:  [194 lb 14.2 oz (88.4 kg)] 194 lb 14.2 oz (88.4 kg) (11/15 0500)  Filed Weights   09/10/12 1500 09/18/12 2214 09/20/12 0500  Weight: 183 lb 3.2 oz (83.1 kg) 184 lb 4.9 oz (83.6 kg) 194 lb 14.2 oz (88.4 kg)    Weight change: 10 lb 9.3 oz (4.8 kg)   Hemodynamic parameters for last 24 hours: PAP: (22-35)/(10-23) 26/10 mmHg CO:  [4.4 L/min-5.6 L/min] 5.6 L/min CI:  [2.2 L/min/m2-2.8 L/min/m2] 2.8 L/min/m2  Intake/Output from previous day: 11/14 0701 - 11/15 0700 In: 4741 [P.O.:8; I.V.:3861; Blood:340; NG/GT:30; IV Piggyback:502] Out: 9147 [Urine:1885; Blood:1655; Chest Tube:335]  Intake/Output this shift:    Current Meds: Scheduled Meds:   . [COMPLETED] acetaminophen  1,000 mg Intravenous Once  . acetaminophen  1,000 mg Oral Q6H   Or  . acetaminophen (TYLENOL) oral liquid 160 mg/5 mL  975 mg Per Tube Q6H  . aspirin EC  325 mg Oral Daily   Or  . aspirin  324 mg Per Tube Daily  . atorvastatin  80 mg Oral q1800  . bisacodyl  10 mg Oral Daily   Or  . bisacodyl  10 mg Rectal Daily  . [COMPLETED] cefUROXime (ZINACEF)  IV  1.5 g Intravenous To OR  . cefUROXime (ZINACEF)  IV  1.5 g Intravenous Q12H  . docusate sodium  200 mg Oral Daily  . famotidine (PEPCID)  IV  20 mg Intravenous Q12H  . fluticasone  1 spray Each Nare Daily  . [COMPLETED] heparin-papaverine-plasmalyte irrigation   Irrigation To OR  . insulin aspart  0-24 Units Subcutaneous Q4H  . insulin aspart  0-24 Units Subcutaneous Q2H  . loratadine  10 mg Oral Daily  . [COMPLETED] magnesium sulfate  4 g Intravenous Once  . metoprolol tartrate  12.5 mg Oral BID   Or  . metoprolol tartrate  12.5 mg Per Tube BID  . pantoprazole  40 mg Oral Daily  . [COMPLETED] potassium chloride  10 mEq Intravenous Q1 Hr x 3  . potassium chloride  10 mEq Intravenous Q1 Hr x 3  . [COMPLETED] sodium bicarbonate  50 mEq Intravenous Once  . sodium chloride  3 mL Intravenous Q12H  . terazosin  5 mg Oral QHS  . [COMPLETED] vancomycin  1,000 mg Intravenous Once  . [DISCONTINUED] amLODipine  5 mg Oral Daily  . [DISCONTINUED] aspirin EC  81 mg Oral Daily  . [DISCONTINUED] cefUROXime (ZINACEF)  IV  750 mg Intravenous To OR  . [DISCONTINUED] DOPamine  2-20 mcg/kg/min Intravenous To OR  . [DISCONTINUED]  epinephrine  0.5-20 mcg/min Intravenous To OR  . [DISCONTINUED] finasteride  5 mg Oral Daily  . [DISCONTINUED] guaiFENesin  1,200 mg Oral BID  . [DISCONTINUED] hydrochlorothiazide  12.5 mg Oral Daily  . [DISCONTINUED] insulin aspart  0-24 Units Subcutaneous Q4H  . [DISCONTINUED] insulin aspart  0-24 Units Subcutaneous Q4H  . [DISCONTINUED] insulin aspart  0-24 Units Subcutaneous Q2H  . [DISCONTINUED] insulin regular  0-10 Units Intravenous TID WC  . [DISCONTINUED] isosorbide mononitrate  30 mg Oral Daily  . [DISCONTINUED] magnesium sulfate  40 mEq Other To OR  . [DISCONTINUED] metoprolol  25 mg Oral BID  . [DISCONTINUED] pantoprazole  80 mg Oral Daily  . [DISCONTINUED] phenylephrine (NEO-SYNEPHRINE) Adult infusion  30-200 mcg/min Intravenous To OR  . [DISCONTINUED] potassium chloride  80 mEq Other To OR  . [DISCONTINUED] ranolazine  1,000 mg Oral BID   Continuous Infusions:   . sodium chloride Stopped  (09/19/12 1437)  . sodium chloride Stopped (09/19/12 1330)  . sodium chloride    . dexmedetomidine Stopped (09/19/12 1515)  . lactated ringers 60 mL/hr at 09/19/12 1500  . nitroGLYCERIN Stopped (09/19/12 1500)  . phenylephrine (NEO-SYNEPHRINE) Adult infusion Stopped (09/19/12 2100)  . [DISCONTINUED] sodium chloride 20 mL/hr at 09/11/12 1800  . [DISCONTINUED] heparin 1,300 Units/hr (09/16/12 1000)  . [DISCONTINUED] insulin (NOVOLIN-R) infusion Stopped (09/20/12 0300)   PRN Meds:.albumin human, [EXPIRED] lactated ringers, metoprolol, midazolam, [EXPIRED]  morphine injection, morphine injection, ondansetron (ZOFRAN) IV, oxyCODONE, sodium chloride, [DISCONTINUED] 0.9 % irrigation (POUR BTL), [DISCONTINUED] acetaminophen, [DISCONTINUED] ALPRAZolam, [DISCONTINUED] butalbital-acetaminophen-caffeine, [DISCONTINUED] hemostatic agents, [DISCONTINUED] HYDROcodone-acetaminophen, [DISCONTINUED] nitroGLYCERIN [DISCONTINUED] ondansetron (ZOFRAN) IV, [DISCONTINUED] Surgifoam 1 Gm with Thrombin 20,000 units (4 ml) topical solution, [DISCONTINUED] zolpidem  General appearance: alert, cooperative and no distress Neurologic: intact Heart: regular rate and rhythm Lungs: sl dim in bases, clear anteriorly Abdomen: Soft, nontender Extremities: no sig edema Wound: dressings CDI  Lab Results: CBC: Basename 09/20/12 0357 09/19/12 2013 09/19/12 1957  WBC 11.8* -- 15.7*  HGB 11.6* 10.9* --  HCT 33.7* 32.0* --  PLT 145* -- 142*   BMET:  Basename 09/20/12 0357 09/19/12 2013 09/19/12 0419  NA 132* 139 --  K 3.6 4.1 --  CL 103 108 --  CO2 22 -- 26  GLUCOSE 126* 147* --  BUN 15 15 --  CREATININE 0.81 1.00 --  CALCIUM 7.9* -- 8.9    PT/INR:  Basename 09/19/12 1330  LABPROT 16.0*  INR 1.31   Radiology: Dg Chest Portable 1 View In Am  09/20/2012  *RADIOLOGY REPORT*  Clinical Data: Postop CABG  PORTABLE CHEST - 1 VIEW  Comparison: 09/19/2012; 09/10/2012  Findings: Grossly unchanged enlarged cardiac  silhouette and mediastinal contours post median sternotomy and CABG.  Interval extubation and removal of enteric tube.  Otherwise, stable positioning of the remaining support apparatus including malpositioned left jugular approach Swan-Ganz catheter with tip coiled over the right main pulmonary artery outflow tract.  Minimally improved aeration of the lungs with persistent pulmonary venous congestion.  Grossly unchanged bibasilar opacities, left greater than right.  Trace left-sided pleural effusion is suspected.  Unchanged bones.  IMPRESSION: 1.  Interval extubation and removal of enteric tube.  Otherwise, stable positioning of remaining support apparatus including malpositioned PA catheter with tip coiled over the right main pulmonary artery. 2.  No pneumothorax. 3.  Minimally improved pulmonary edema.  4.  Unchanged trace left-sided effusion with left basilar/retrocardiac opacities, likely atelectasis.  This was made a call report.   Original Report Authenticated By: Tacey Ruiz,  MD    Dg Chest Portable 1 View  09/19/2012  *RADIOLOGY REPORT*  Clinical Data: Postop day zero status post CABG.  PORTABLE CHEST - 1 VIEW  Comparison: 09/10/2012  Findings: Endotracheal tube tip is 3.9 cm above the carina. Nasogastric tube is in the stomach.  Left internal jugular Swan-Ganz catheter coils once in the vicinity of the lower right atrium, extends into the right pulmonary artery, and then coils back towards the left side to terminate in the main pulmonary artery.  Accordingly, there is several coils redundancy.  Left-sided chest tubes and mediastinal drain noted.  A linear edge adjacent to the aortic arch may represent a small amount of pneumomediastinum or potentially a tiny amount of adjacent pneumothorax, but no other signs of pneumothorax are observed.  Mild interstitial accentuation noted with mild infrahilar airspace opacities bilaterally.  Mild blunting left costophrenic angle. Epicardial pacer leads noted.  Low  lung volumes are present, causing crowding of the pulmonary vasculature.  IMPRESSION:  1.  Redundancy of the Swan-Ganz catheter, with a small loop inferiorly in the right atrium and with the catheter looping back upon itself in the right pulmonary artery to terminate in the main pulmonary artery.  This may constitute up to 8 cm of redundancy. Retraction to reduce these loops and chest reimaging should be considered. 2.  Lucency along the left upper mediastinal margin, potentially simply from atelectasis although I cannot exclude a small amount of pneumomediastinum or a tiny medially located left pneumothorax. 3.  Mild blunting left costophrenic angle, with mild interstitial accentuation and bilateral infrahilar opacities potentially reflecting residual edema or atelectasis.   Original Report Authenticated By: Gaylyn Rong, M.D.      Assessment/Plan: S/P Procedure(s) (LRB): REDO CORONARY ARTERY BYPASS GRAFTING (CABG) (N/A) Mobilize Diuresis d/c pacing wires d/c tubes/lines See progression orders Expected ABL anemia- monitor    GOLD,WAYNE E 09/20/2012 8:09 AM    I have seen and examined the patient and agree with the assessment and plan as outlined.  Adisynn Suleiman H 09/20/2012

## 2012-09-21 ENCOUNTER — Inpatient Hospital Stay (HOSPITAL_COMMUNITY): Payer: Medicare Other

## 2012-09-21 LAB — CBC
HCT: 30 % — ABNORMAL LOW (ref 39.0–52.0)
Hemoglobin: 10.1 g/dL — ABNORMAL LOW (ref 13.0–17.0)
RDW: 15.2 % (ref 11.5–15.5)
WBC: 12.2 10*3/uL — ABNORMAL HIGH (ref 4.0–10.5)

## 2012-09-21 LAB — GLUCOSE, CAPILLARY
Glucose-Capillary: 131 mg/dL — ABNORMAL HIGH (ref 70–99)
Glucose-Capillary: 108 mg/dL — ABNORMAL HIGH (ref 70–99)
Glucose-Capillary: 113 mg/dL — ABNORMAL HIGH (ref 70–99)

## 2012-09-21 LAB — BASIC METABOLIC PANEL
Chloride: 103 mEq/L (ref 96–112)
Creatinine, Ser: 0.82 mg/dL (ref 0.50–1.35)
GFR calc Af Amer: >90 mL/min (ref >90)
Potassium: 3.7 mEq/L (ref 3.5–5.1)
Sodium: 134 mEq/L — ABNORMAL LOW (ref 135–145)

## 2012-09-21 MED ORDER — BISACODYL 5 MG PO TBEC: 10.0000 mg | DELAYED_RELEASE_TABLET | Freq: Every day | ORAL | Status: DC | PRN

## 2012-09-21 MED ORDER — MAGNESIUM HYDROXIDE 400 MG/5ML PO SUSP: 30.0000 mL | Freq: Every day | ORAL | Status: DC | PRN

## 2012-09-21 MED ORDER — MOVING RIGHT ALONG BOOK
Freq: Once | Status: AC
  Administered 2012-09-21: 17:00:00
  Filled 2012-09-21: qty 1

## 2012-09-21 MED ORDER — DOCUSATE SODIUM 100 MG PO CAPS
200.0000 mg | ORAL_CAPSULE | Freq: Every day | ORAL | Status: DC
  Administered 2012-09-22: 200 mg via ORAL
  Filled 2012-09-21 (×2): qty 2

## 2012-09-21 MED ORDER — TRAMADOL HCL 50 MG PO TABS: 50.0000 mg | ORAL_TABLET | Freq: Four times a day (QID) | ORAL | Status: DC | PRN

## 2012-09-21 MED ORDER — POTASSIUM CHLORIDE 10 MEQ/50ML IV SOLN
10.0000 meq | INTRAVENOUS | Status: AC
  Administered 2012-09-21 (×3): 10 meq via INTRAVENOUS
  Filled 2012-09-21: qty 50

## 2012-09-21 MED ORDER — POTASSIUM CHLORIDE 10 MEQ/50ML IV SOLN
INTRAVENOUS | Status: AC
  Filled 2012-09-21: qty 100

## 2012-09-21 MED ORDER — INSULIN ASPART 100 UNIT/ML ~~LOC~~ SOLN: 0.0000 [IU] | Freq: Three times a day (TID) | SUBCUTANEOUS | Status: DC

## 2012-09-21 MED ORDER — SODIUM CHLORIDE 0.9 % IJ SOLN: 3.0000 mL | INTRAMUSCULAR | Status: DC | PRN

## 2012-09-21 MED ORDER — BISACODYL 10 MG RE SUPP: 10.0000 mg | Freq: Every day | RECTAL | Status: DC | PRN

## 2012-09-21 MED ORDER — SODIUM CHLORIDE 0.9 % IV SOLN: 250.0000 mL | INTRAVENOUS | Status: DC | PRN

## 2012-09-21 MED ORDER — ONDANSETRON HCL 4 MG PO TABS: 4.0000 mg | ORAL_TABLET | Freq: Four times a day (QID) | ORAL | Status: DC | PRN

## 2012-09-21 MED ORDER — SODIUM CHLORIDE 0.9 % IJ SOLN
3.0000 mL | Freq: Two times a day (BID) | INTRAMUSCULAR | Status: DC
  Administered 2012-09-21 – 2012-09-22 (×3): 3 mL via INTRAVENOUS

## 2012-09-21 MED ORDER — ONDANSETRON HCL 4 MG/2ML IJ SOLN: 4.0000 mg | Freq: Four times a day (QID) | INTRAMUSCULAR | Status: DC | PRN

## 2012-09-21 MED ORDER — POTASSIUM CHLORIDE CRYS ER 20 MEQ PO TBCR
20.0000 meq | EXTENDED_RELEASE_TABLET | Freq: Every day | ORAL | Status: DC
  Administered 2012-09-21: 20 meq via ORAL
  Filled 2012-09-21 (×2): qty 1

## 2012-09-21 NOTE — Progress Notes (Signed)
Subjective:  POD # 2 re do CABG X 1 LCX-OM SVG  Objective:  Temp:  [97.7 F (36.5 C)-98.6 F (37 C)] 98.1 F (36.7 C) (11/16 0800) Pulse Rate:  [56-86] 57  (11/16 0830) Resp:  [13-27] 17  (11/16 0830) BP: (87-146)/(56-80) 111/57 mmHg (11/16 0830) SpO2:  [90 %-98 %] 97 % (11/16 0830) Arterial Line BP: (137)/(66) 137/66 mmHg (11/15 0900) Weight:  [87.7 kg (193 lb 5.5 oz)] 87.7 kg (193 lb 5.5 oz) (11/16 0500) Weight change: -0.7 kg (-1 lb 8.7 oz)  Intake/Output from previous day: 11/15 0701 - 11/16 0700 In: 1310 [P.O.:540; I.V.:510; IV Piggyback:260] Out: 1500 [Urine:1450; Chest Tube:50]  Intake/Output from this shift: Total I/O In: 172 [P.O.:120; IV Piggyback:52] Out: 120 [Urine:120]  Physical Exam: General appearance: alert, cooperative and no distress Neck: no adenopathy, no carotid bruit, no JVD, supple, symmetrical, trachea midline and thyroid not enlarged, symmetric, no tenderness/mass/nodules Lungs: clear to auscultation bilaterally Heart: regular rate and rhythm, S1, S2 normal, no murmur, click, rub or gallop Extremities: 1+ BLE edema  Lab Results: Results for orders placed during the hospital encounter of 09/10/12 (from the past 48 hour(s))  POCT I-STAT 4, (NA,K, GLUC, HGB,HCT)     Status: Abnormal   Collection Time   09/19/12  9:14 AM      Component Value Range Comment   Sodium 140  135 - 145 mEq/L    Potassium 3.3 (*) 3.5 - 5.1 mEq/L    Glucose, Bld 93  70 - 99 mg/dL    HCT 16.1 (*) 09.6 - 52.0 %    Hemoglobin 10.9 (*) 13.0 - 17.0 g/dL   POCT I-STAT 4, (NA,K, GLUC, HGB,HCT)     Status: Abnormal   Collection Time   09/19/12  9:35 AM      Component Value Range Comment   Sodium 135  135 - 145 mEq/L    Potassium 3.4 (*) 3.5 - 5.1 mEq/L    Glucose, Bld 82  70 - 99 mg/dL    HCT 04.5 (*) 40.9 - 52.0 %    Hemoglobin 8.2 (*) 13.0 - 17.0 g/dL   POCT I-STAT 3, BLOOD GAS (G3+)     Status: Abnormal   Collection Time   09/19/12  9:38 AM      Component Value Range  Comment   pH, Arterial 7.313 (*) 7.350 - 7.450    pCO2 arterial 46.3 (*) 35.0 - 45.0 mmHg    pO2, Arterial 349.0 (*) 80.0 - 100.0 mmHg    Bicarbonate 23.5  20.0 - 24.0 mEq/L    TCO2 25  0 - 100 mmol/L    O2 Saturation 100.0      Acid-base deficit 3.0 (*) 0.0 - 2.0 mmol/L    Sample type ARTERIAL     POCT I-STAT GLUCOSE     Status: Abnormal   Collection Time   09/19/12 11:03 AM      Component Value Range Comment   Operator id [id number]      Glucose, Bld 115 (*) 70 - 99 mg/dL   PLATELET COUNT     Status: Normal   Collection Time   09/19/12 11:15 AM      Component Value Range Comment   Platelets 155  150 - 400 K/uL DELTA CHECK NOTED  HEMOGLOBIN AND HEMATOCRIT, BLOOD     Status: Abnormal   Collection Time   09/19/12 11:15 AM      Component Value Range Comment   Hemoglobin 8.8 (*) 13.0 - 17.0 g/dL  DELTA CHECK NOTED   HCT 25.6 (*) 39.0 - 52.0 %   POCT I-STAT GLUCOSE     Status: Abnormal   Collection Time   09/19/12 11:28 AM      Component Value Range Comment   Operator id [id number]      Glucose, Bld 111 (*) 70 - 99 mg/dL   POCT I-STAT 4, (NA,K, GLUC, HGB,HCT)     Status: Abnormal   Collection Time   09/19/12 11:58 AM      Component Value Range Comment   Sodium 138  135 - 145 mEq/L    Potassium 4.1  3.5 - 5.1 mEq/L    Glucose, Bld 129 (*) 70 - 99 mg/dL    HCT 62.1 (*) 30.8 - 52.0 %    Hemoglobin 9.5 (*) 13.0 - 17.0 g/dL   CBC     Status: Abnormal   Collection Time   09/19/12  1:30 PM      Component Value Range Comment   WBC 15.4 (*) 4.0 - 10.5 K/uL    RBC 3.66 (*) 4.22 - 5.81 MIL/uL    Hemoglobin 11.0 (*) 13.0 - 17.0 g/dL    HCT 65.7 (*) 84.6 - 52.0 %    MCV 87.2  78.0 - 100.0 fL    MCH 30.1  26.0 - 34.0 pg    MCHC 34.5  30.0 - 36.0 g/dL    RDW 96.2  95.2 - 84.1 %    Platelets 142 (*) 150 - 400 K/uL   PROTIME-INR     Status: Abnormal   Collection Time   09/19/12  1:30 PM      Component Value Range Comment   Prothrombin Time 16.0 (*) 11.6 - 15.2 seconds    INR 1.31  0.00 -  1.49   APTT     Status: Abnormal   Collection Time   09/19/12  1:30 PM      Component Value Range Comment   aPTT 42 (*) 24 - 37 seconds   POCT I-STAT 4, (NA,K, GLUC, HGB,HCT)     Status: Abnormal   Collection Time   09/19/12  1:31 PM      Component Value Range Comment   Sodium 139  135 - 145 mEq/L    Potassium 3.7  3.5 - 5.1 mEq/L    Glucose, Bld 117 (*) 70 - 99 mg/dL    HCT 32.4 (*) 40.1 - 52.0 %    Hemoglobin 11.2 (*) 13.0 - 17.0 g/dL   POCT I-STAT 3, BLOOD GAS (G3+)     Status: Abnormal   Collection Time   09/19/12  1:35 PM      Component Value Range Comment   pH, Arterial 7.339 (*) 7.350 - 7.450    pCO2 arterial 40.4  35.0 - 45.0 mmHg    pO2, Arterial 83.0  80.0 - 100.0 mmHg    Bicarbonate 21.9  20.0 - 24.0 mEq/L    TCO2 23  0 - 100 mmol/L    O2 Saturation 96.0      Acid-base deficit 4.0 (*) 0.0 - 2.0 mmol/L    Patient temperature 36.5 C      Collection site ARTERIAL LINE      Drawn by Operator      Sample type ARTERIAL     GLUCOSE, CAPILLARY     Status: Abnormal   Collection Time   09/19/12  2:35 PM      Component Value Range Comment   Glucose-Capillary 117 (*) 70 - 99  mg/dL   GLUCOSE, CAPILLARY     Status: Abnormal   Collection Time   09/19/12  3:27 PM      Component Value Range Comment   Glucose-Capillary 114 (*) 70 - 99 mg/dL   GLUCOSE, CAPILLARY     Status: Abnormal   Collection Time   09/19/12  4:32 PM      Component Value Range Comment   Glucose-Capillary 120 (*) 70 - 99 mg/dL   POCT I-STAT 3, BLOOD GAS (G3+)     Status: Abnormal   Collection Time   09/19/12  4:35 PM      Component Value Range Comment   pH, Arterial 7.347 (*) 7.350 - 7.450    pCO2 arterial 40.0  35.0 - 45.0 mmHg    pO2, Arterial 126.0 (*) 80.0 - 100.0 mmHg    Bicarbonate 22.0  20.0 - 24.0 mEq/L    TCO2 23  0 - 100 mmol/L    O2 Saturation 99.0      Acid-base deficit 3.0 (*) 0.0 - 2.0 mmol/L    Patient temperature 36.9 C      Collection site RADIAL, ALLEN'S TEST ACCEPTABLE      Drawn  by Nurse      Sample type ARTERIAL     GLUCOSE, CAPILLARY     Status: Abnormal   Collection Time   09/19/12  5:46 PM      Component Value Range Comment   Glucose-Capillary 124 (*) 70 - 99 mg/dL   POCT I-STAT 3, BLOOD GAS (G3+)     Status: Abnormal   Collection Time   09/19/12  5:48 PM      Component Value Range Comment   pH, Arterial 7.360  7.350 - 7.450    pCO2 arterial 36.8  35.0 - 45.0 mmHg    pO2, Arterial 104.0 (*) 80.0 - 100.0 mmHg    Bicarbonate 20.8  20.0 - 24.0 mEq/L    TCO2 22  0 - 100 mmol/L    O2 Saturation 98.0      Acid-base deficit 4.0 (*) 0.0 - 2.0 mmol/L    Patient temperature 36.9 C      Collection site RADIAL, ALLEN'S TEST ACCEPTABLE      Drawn by Nurse      Sample type ARTERIAL     GLUCOSE, CAPILLARY     Status: Abnormal   Collection Time   09/19/12  6:52 PM      Component Value Range Comment   Glucose-Capillary 108 (*) 70 - 99 mg/dL   CBC     Status: Abnormal   Collection Time   09/19/12  7:57 PM      Component Value Range Comment   WBC 15.7 (*) 4.0 - 10.5 K/uL    RBC 3.85 (*) 4.22 - 5.81 MIL/uL    Hemoglobin 11.3 (*) 13.0 - 17.0 g/dL    HCT 91.4 (*) 78.2 - 52.0 %    MCV 87.0  78.0 - 100.0 fL    MCH 29.4  26.0 - 34.0 pg    MCHC 33.7  30.0 - 36.0 g/dL    RDW 95.6  21.3 - 08.6 %    Platelets 142 (*) 150 - 400 K/uL   MAGNESIUM     Status: Abnormal   Collection Time   09/19/12  7:57 PM      Component Value Range Comment   Magnesium 2.6 (*) 1.5 - 2.5 mg/dL   CREATININE, SERUM     Status: Normal   Collection Time  09/19/12  7:57 PM      Component Value Range Comment   Creatinine, Ser 0.84  0.50 - 1.35 mg/dL    GFR calc non Af Amer >90  >90 mL/min    GFR calc Af Amer >90  >90 mL/min   GLUCOSE, CAPILLARY     Status: Abnormal   Collection Time   09/19/12  8:06 PM      Component Value Range Comment   Glucose-Capillary 152 (*) 70 - 99 mg/dL   POCT I-STAT, CHEM 8     Status: Abnormal   Collection Time   09/19/12  8:13 PM      Component Value Range  Comment   Sodium 139  135 - 145 mEq/L    Potassium 4.1  3.5 - 5.1 mEq/L    Chloride 108  96 - 112 mEq/L    BUN 15  6 - 23 mg/dL    Creatinine, Ser 1.61  0.50 - 1.35 mg/dL    Glucose, Bld 096 (*) 70 - 99 mg/dL    Calcium, Ion 0.45  4.09 - 1.30 mmol/L    TCO2 19  0 - 100 mmol/L    Hemoglobin 10.9 (*) 13.0 - 17.0 g/dL    HCT 81.1 (*) 91.4 - 52.0 %   GLUCOSE, CAPILLARY     Status: Abnormal   Collection Time   09/19/12  9:10 PM      Component Value Range Comment   Glucose-Capillary 138 (*) 70 - 99 mg/dL   GLUCOSE, CAPILLARY     Status: Abnormal   Collection Time   09/19/12 10:02 PM      Component Value Range Comment   Glucose-Capillary 114 (*) 70 - 99 mg/dL   GLUCOSE, CAPILLARY     Status: Abnormal   Collection Time   09/19/12 10:59 PM      Component Value Range Comment   Glucose-Capillary 105 (*) 70 - 99 mg/dL   GLUCOSE, CAPILLARY     Status: Abnormal   Collection Time   09/19/12 11:53 PM      Component Value Range Comment   Glucose-Capillary 102 (*) 70 - 99 mg/dL    Comment 1 Documented in Chart      Comment 2 Notify RN     GLUCOSE, CAPILLARY     Status: Abnormal   Collection Time   09/20/12  1:08 AM      Component Value Range Comment   Glucose-Capillary 108 (*) 70 - 99 mg/dL   GLUCOSE, CAPILLARY     Status: Abnormal   Collection Time   09/20/12  2:04 AM      Component Value Range Comment   Glucose-Capillary 108 (*) 70 - 99 mg/dL    Comment 1 Documented in Chart      Comment 2 Notify RN     CBC     Status: Abnormal   Collection Time   09/20/12  3:57 AM      Component Value Range Comment   WBC 11.8 (*) 4.0 - 10.5 K/uL    RBC 3.85 (*) 4.22 - 5.81 MIL/uL    Hemoglobin 11.6 (*) 13.0 - 17.0 g/dL    HCT 78.2 (*) 95.6 - 52.0 %    MCV 87.5  78.0 - 100.0 fL    MCH 30.1  26.0 - 34.0 pg    MCHC 34.4  30.0 - 36.0 g/dL    RDW 21.3  08.6 - 57.8 %    Platelets 145 (*) 150 - 400 K/uL  BASIC METABOLIC PANEL     Status: Abnormal   Collection Time   09/20/12  3:57 AM       Component Value Range Comment   Sodium 132 (*) 135 - 145 mEq/L DELTA CHECK NOTED   Potassium 3.6  3.5 - 5.1 mEq/L    Chloride 103  96 - 112 mEq/L    CO2 22  19 - 32 mEq/L    Glucose, Bld 126 (*) 70 - 99 mg/dL    BUN 15  6 - 23 mg/dL    Creatinine, Ser 4.09  0.50 - 1.35 mg/dL    Calcium 7.9 (*) 8.4 - 10.5 mg/dL    GFR calc non Af Amer >90  >90 mL/min    GFR calc Af Amer >90  >90 mL/min   MAGNESIUM     Status: Normal   Collection Time   09/20/12  3:57 AM      Component Value Range Comment   Magnesium 2.4  1.5 - 2.5 mg/dL   GLUCOSE, CAPILLARY     Status: Abnormal   Collection Time   09/20/12  3:58 AM      Component Value Range Comment   Glucose-Capillary 119 (*) 70 - 99 mg/dL   GLUCOSE, CAPILLARY     Status: Abnormal   Collection Time   09/20/12  5:45 AM      Component Value Range Comment   Glucose-Capillary 128 (*) 70 - 99 mg/dL   GLUCOSE, CAPILLARY     Status: Abnormal   Collection Time   09/20/12  8:08 AM      Component Value Range Comment   Glucose-Capillary 103 (*) 70 - 99 mg/dL    Comment 1 Documented in Chart      Comment 2 Notify RN     GLUCOSE, CAPILLARY     Status: Abnormal   Collection Time   09/20/12 12:21 PM      Component Value Range Comment   Glucose-Capillary 122 (*) 70 - 99 mg/dL    Comment 1 Documented in Chart      Comment 2 Notify RN     POCT I-STAT, CHEM 8     Status: Abnormal   Collection Time   09/20/12  4:53 PM      Component Value Range Comment   Sodium 138  135 - 145 mEq/L    Potassium 4.0  3.5 - 5.1 mEq/L    Chloride 102  96 - 112 mEq/L    BUN 20  6 - 23 mg/dL    Creatinine, Ser 8.11  0.50 - 1.35 mg/dL    Glucose, Bld 914 (*) 70 - 99 mg/dL    Calcium, Ion 7.82  9.56 - 1.30 mmol/L    TCO2 23  0 - 100 mmol/L    Hemoglobin 12.6 (*) 13.0 - 17.0 g/dL    HCT 21.3 (*) 08.6 - 52.0 %   MAGNESIUM     Status: Normal   Collection Time   09/20/12  4:56 PM      Component Value Range Comment   Magnesium 2.1  1.5 - 2.5 mg/dL   CBC     Status:  Abnormal   Collection Time   09/20/12  4:56 PM      Component Value Range Comment   WBC 15.3 (*) 4.0 - 10.5 K/uL    RBC 3.98 (*) 4.22 - 5.81 MIL/uL    Hemoglobin 11.8 (*) 13.0 - 17.0 g/dL    HCT 57.8 (*) 46.9 - 52.0 %  MCV 88.2  78.0 - 100.0 fL    MCH 29.6  26.0 - 34.0 pg    MCHC 33.6  30.0 - 36.0 g/dL    RDW 82.9  56.2 - 13.0 %    Platelets 142 (*) 150 - 400 K/uL   CREATININE, SERUM     Status: Abnormal   Collection Time   09/20/12  4:56 PM      Component Value Range Comment   Creatinine, Ser 0.88  0.50 - 1.35 mg/dL    GFR calc non Af Amer 89 (*) >90 mL/min    GFR calc Af Amer >90  >90 mL/min   GLUCOSE, CAPILLARY     Status: Abnormal   Collection Time   09/20/12  5:04 PM      Component Value Range Comment   Glucose-Capillary 116 (*) 70 - 99 mg/dL    Comment 1 Documented in Chart      Comment 2 Notify RN     GLUCOSE, CAPILLARY     Status: Abnormal   Collection Time   09/20/12  8:21 PM      Component Value Range Comment   Glucose-Capillary 141 (*) 70 - 99 mg/dL    Comment 1 Notify RN      Comment 2 Documented in Chart     GLUCOSE, CAPILLARY     Status: Abnormal   Collection Time   09/21/12 12:17 AM      Component Value Range Comment   Glucose-Capillary 131 (*) 70 - 99 mg/dL    Comment 1 Notify RN      Comment 2 Documented in Chart     GLUCOSE, CAPILLARY     Status: Abnormal   Collection Time   09/21/12  4:07 AM      Component Value Range Comment   Glucose-Capillary 108 (*) 70 - 99 mg/dL    Comment 1 Notify RN      Comment 2 Documented in Chart     BASIC METABOLIC PANEL     Status: Abnormal   Collection Time   09/21/12  4:50 AM      Component Value Range Comment   Sodium 134 (*) 135 - 145 mEq/L    Potassium 3.7  3.5 - 5.1 mEq/L    Chloride 103  96 - 112 mEq/L    CO2 27  19 - 32 mEq/L    Glucose, Bld 99  70 - 99 mg/dL    BUN 23  6 - 23 mg/dL    Creatinine, Ser 8.65  0.50 - 1.35 mg/dL    Calcium 8.1 (*) 8.4 - 10.5 mg/dL    GFR calc non Af Amer >90  >90 mL/min     GFR calc Af Amer >90  >90 mL/min   CBC     Status: Abnormal   Collection Time   09/21/12  4:50 AM      Component Value Range Comment   WBC 12.2 (*) 4.0 - 10.5 K/uL    RBC 3.38 (*) 4.22 - 5.81 MIL/uL    Hemoglobin 10.1 (*) 13.0 - 17.0 g/dL    HCT 78.4 (*) 69.6 - 52.0 %    MCV 88.8  78.0 - 100.0 fL    MCH 29.9  26.0 - 34.0 pg    MCHC 33.7  30.0 - 36.0 g/dL    RDW 29.5  28.4 - 13.2 %    Platelets 135 (*) 150 - 400 K/uL   GLUCOSE, CAPILLARY     Status: Abnormal  Collection Time   09/21/12  8:14 AM      Component Value Range Comment   Glucose-Capillary 108 (*) 70 - 99 mg/dL     Imaging: Imaging results have been reviewed  Assessment/Plan:   1. Principal Problem: 2.  *Unstable angina, on appropriate medical therapy, re-do CABG 09/19/12 X1 with SVG-OM 09/19/12 3. Active Problems: 4.  CAD, CABG X 09 Nov 2011. 5.  Smoker 6.  HTN (hypertension) 7.  ICM, EF 40% Jan 2013, now 50-55% Oct 2013 8.  NSTEMI 08/29/12 secondary to occluded LM 9.  Dyslipidemia 10.  BPH (benign prostatic hyperplasia) 11.   Time Spent Directly with Patient:  20 minutes  Length of Stay:  LOS: 11 days   Looks great POD # 2 re do CABG X 1. Exam benign. VSS. Labs OK. NSR. Mild atelectasis on CXR. Continue gentle diuresis. Progression per TCTS.  Runell Gess 09/21/2012, 8:57 AM

## 2012-09-21 NOTE — Progress Notes (Signed)
2 Days Post-Op Procedure(s) (LRB): REDO CORONARY ARTERY BYPASS GRAFTING (CABG) (N/A) Subjective:stable redo CABGx1  Objective: Vital signs in last 24 hours: Temp:  [98 F (36.7 C)-98.5 F (36.9 C)] 98 F (36.7 C) (11/16 1200) Pulse Rate:  [56-75] 59  (11/16 1200) Cardiac Rhythm:  [-] Normal sinus rhythm (11/16 1200) Resp:  [13-25] 17  (11/16 1200) BP: (87-141)/(56-78) 117/61 mmHg (11/16 1200) SpO2:  [92 %-99 %] 97 % (11/16 1200) Weight:  [193 lb 5.5 oz (87.7 kg)] 193 lb 5.5 oz (87.7 kg) (11/16 0500)  Hemodynamic parameters for last 24 hours:  nsr  Intake/Output from previous day: 11/15 0701 - 11/16 0700 In: 1310 [P.O.:540; I.V.:510; IV Piggyback:260] Out: 1500 [Urine:1450; Chest Tube:50] Intake/Output this shift: Total I/O In: 172 [P.O.:120; IV Piggyback:52] Out: 270 [Urine:270]  Lungs clear  Lab Results:  [facility] 09/21/12 0450 09/20/12 1656  WBC 12.2* 15.3*  HGB 10.1* 11.8*  HCT 30.0* 35.1*  PLT 135* 142*   BMET:  Basename 09/21/12 0450 09/20/12 1656 09/20/12 1653 09/20/12 0357  NA 134* -- 138 --  K 3.7 -- 4.0 --  CL 103 -- 102 --  CO2 27 -- -- 22  GLUCOSE 99 -- 136* --  BUN 23 -- 20 --  CREATININE 0.82 0.88 -- --  CALCIUM 8.1* -- -- 7.9*    PT/INR:  Basename 09/19/12 1330  LABPROT 16.0*  INR 1.31   ABG    Component Value Date/Time   PHART 7.360 09/19/2012 1748   HCO3 20.8 09/19/2012 1748   TCO2 23 09/20/2012 1653   ACIDBASEDEF 4.0* 09/19/2012 1748   O2SAT 98.0 09/19/2012 1748   CBG (last 3)   Basename 09/21/12 1240 09/21/12 0814 09/21/12 0407  GLUCAP 113* 108* 108*    Assessment/Plan: S/P Procedure(s) (LRB): REDO CORONARY ARTERY BYPASS GRAFTING (CABG) (N/A) Plan for transfer to step-down: see transfer orders   LOS: 11 days    VAN TRIGT III,Jamoni Broadfoot 09/21/2012

## 2012-09-22 ENCOUNTER — Inpatient Hospital Stay (HOSPITAL_COMMUNITY): Payer: Medicare Other

## 2012-09-22 LAB — BASIC METABOLIC PANEL
BUN: 21 mg/dL (ref 6–23)
CO2: 27 mEq/L (ref 19–32)
Calcium: 8 mg/dL — ABNORMAL LOW (ref 8.4–10.5)
Chloride: 101 mEq/L (ref 96–112)
Creatinine, Ser: 0.7 mg/dL (ref 0.50–1.35)
GFR calc Af Amer: >90 mL/min (ref >90)
GFR calc non Af Amer: >90 mL/min (ref >90)
Glucose, Bld: 104 mg/dL — ABNORMAL HIGH (ref 70–99)
Potassium: 3.5 mEq/L (ref 3.5–5.1)
Sodium: 135 mEq/L (ref 135–145)

## 2012-09-22 LAB — CBC
HCT: 28.9 % — ABNORMAL LOW (ref 39.0–52.0)
Hemoglobin: 9.8 g/dL — ABNORMAL LOW (ref 13.0–17.0)
MCH: 30.2 pg (ref 26.0–34.0)
MCHC: 33.9 g/dL (ref 30.0–36.0)
MCV: 88.9 fL (ref 78.0–100.0)
Platelets: 130 10*3/uL — ABNORMAL LOW (ref 150–400)
RBC: 3.25 MIL/uL — ABNORMAL LOW (ref 4.22–5.81)
RDW: 15.3 % (ref 11.5–15.5)
WBC: 9.3 10*3/uL (ref 4.0–10.5)

## 2012-09-22 LAB — GLUCOSE, CAPILLARY
Glucose-Capillary: 106 mg/dL — ABNORMAL HIGH (ref 70–99)
Glucose-Capillary: 95 mg/dL (ref 70–99)
Glucose-Capillary: 88 mg/dL (ref 70–99)

## 2012-09-22 LAB — TYPE AND SCREEN: Unit division: 0

## 2012-09-22 MED ORDER — POTASSIUM CHLORIDE CRYS ER 20 MEQ PO TBCR: 20.0000 meq | EXTENDED_RELEASE_TABLET | Freq: Two times a day (BID) | ORAL | Status: DC

## 2012-09-22 MED ORDER — FUROSEMIDE 20 MG PO TABS
20.0000 mg | ORAL_TABLET | Freq: Two times a day (BID) | ORAL | Status: DC
  Administered 2012-09-22 – 2012-09-23 (×3): 20 mg via ORAL
  Filled 2012-09-22 (×5): qty 1

## 2012-09-22 MED ORDER — ASPIRIN 325 MG PO TBEC: 325.0000 mg | DELAYED_RELEASE_TABLET | Freq: Every day | ORAL | Status: DC

## 2012-09-22 MED ORDER — FUROSEMIDE 20 MG PO TABS: 20.0000 mg | ORAL_TABLET | Freq: Two times a day (BID) | ORAL | Status: DC

## 2012-09-22 MED ORDER — POTASSIUM CHLORIDE CRYS ER 20 MEQ PO TBCR
20.0000 meq | EXTENDED_RELEASE_TABLET | Freq: Two times a day (BID) | ORAL | Status: DC
  Administered 2012-09-22 (×2): 20 meq via ORAL
  Filled 2012-09-22 (×3): qty 1

## 2012-09-22 MED ORDER — METOPROLOL TARTRATE 25 MG PO TABS: 12.5000 mg | ORAL_TABLET | Freq: Two times a day (BID) | ORAL | Status: DC

## 2012-09-22 NOTE — Discharge Summary (Signed)
Physician Discharge Summary  Patient ID: [name] MRN: [id number] DOB/AGE: December 27, 1945 66 y.o.  Admit date: 09/10/2012 Discharge date: 09/22/2012  Admission Diagnoses:  Patient Active Problem List  Diagnosis  . S/P CABG x 3: LIMA TO LAD, SVG TO OM, SVG TO RCA., VG to OM occluded, jeopardized LCX, by cath 08/29/12  . CAD, CABG X 09 Nov 2011.  Marland Kitchen Unstable angina, on appropriate medical therapy, re-do CABG 09/19/12 X1 with SVG-OM 09/19/12  . Abnormal EKG, marked diffuse downsloping ST depression,( treated as STEMI in ER)  . Acute MI,( only one POC marker drawn-0.9)  . Smoker  . HTN (hypertension)  . ICM, EF 40% Jan 2013, now 50-55% Oct 2013  . NSTEMI 08/29/12 secondary to occluded LM  . Nasal sinus congestion  . Dyslipidemia  . BPH (benign prostatic hyperplasia)   Discharge Diagnoses:   Patient Active Problem List  Diagnosis  . S/P CABG x 3: LIMA TO LAD, SVG TO OM, SVG TO RCA., VG to OM occluded, jeopardized LCX, by cath 08/29/12  . CAD, CABG X 09 Nov 2011.  Marland Kitchen Unstable angina, on appropriate medical therapy, re-do CABG 09/19/12 X1 with SVG-OM 09/19/12  . Abnormal EKG, marked diffuse downsloping ST depression,( treated as STEMI in ER)  . Acute MI,( only one POC marker drawn-0.9)  . Smoker  . HTN (hypertension)  . ICM, EF 40% Jan 2013, now 50-55% Oct 2013  . NSTEMI 08/29/12 secondary to occluded LM  . Nasal sinus congestion  . Dyslipidemia  . BPH (benign prostatic hyperplasia)   Discharged Condition: good  History of Present Illness:   [name] is a 66 yo white male well known to Dr. Laneta Green.  He is S/P CABG x3 performed 11/2011.  The patient initially did well after surgery.  However, a few weeks ago the patient developed complaints of unstable angina.  He underwent catheterization which showed a totally occluded left main at the ostium and the Obtuse marginal graft was also occluded.  The patient initially wanted to try medical therapy.  He was chest pain free with ambulation  around the hallways and he was discharged home.  However, he again developed chest pain that awoke the patient from sleep.  This episode was associated with dyspnea.  He presented to the Emergency Department at which time he was admitted to rule out possible MI.  Hospital Course:   Upon arrival the patient was placed on a Heparin and Nitroglycerin drip.  Cardiac surgery consult was obtained and the patient was evaluated by Dr. Laneta Green for possible redo Coronary Bypass.  He was evaluated on 09/10/2012 at which time it was felt the patient would benefit from a redo coronary bypass procedure to his obtuse marginal branch.  The risks and benefits of the procedure were explained to the patient and he was agreeable to proceed.  The patient remained chest pain free prior to surgery.  On 09/19/2012 the patient was taken to the operating room and underwent Redo Sternotomy with CABG x1 utilizing SVG to OM1.  He also underwent endoscopic saphenous vein harvest of his left thigh.  He tolerated the procedure well and was taken to the SICU in stable condition.  The patient is doing very well post operatively.  He was extubated without difficulty.  His chest tubes have been removed with no evidence of pneumothorax.  He has been maintaining NSR and his pacing wires were removed.  Should the patient continue to progress, we will plan for discharge home in the next  24 hours.  He will follow up with Dr. Laneta Green in 3 weeks with a chest xray.  He will also need to follow up with Dr. Allyson Green in 2 weeks.    Significant Diagnostic Studies: cardiac catheterization  HEMODYNAMIC DATA: Central aortic pressure was 125/63. Left ventricular  pressure 125/60.  ANGIOGRAPHIC DATA: Left main coronary artery was totally occluded at  its origin.  The right coronary artery was moderate-sized vessel. A flush and fill  phenomena was seen in the branch and arising from the acute margin. The  distal RCA ended in a PLA-like vessel branch.  The left  internal mammary artery vessel was tortuous and free of  significant disease. This anastomosed into the mid LAD just proximal to  bifurcating into a diagonal vessel. The LAD was then a twin like  vessel, which did not reach the apex. Between the LIMA insertion and  the proximal septal perforating artery, there was at least 80% LAD  stenosis. There was then filling of the LAD back into the left main  where the left main was totally occluded. The entire circumflex vessel  filled via this left injection and appeared to be without significant  obstruction. The circumflex vessel gave rise to a bifurcating marginal  vessel. The vein graft which had supplied the circumflex vessel was occluded at  its origin. The vein graft which had supplied the distal RCA seem to fill into this  branch arising from the acute margin, which was small caliber and gave  rise to several additional small branches and was free of significant  disease. RAO ventriculography revealed an ejection fraction of 50% to 55%. There  was mild mid inferior and upper anterolateral hypocontractility.  Aortic root injection did not demonstrate any aortic insufficiency.  Only 1 vein graft was visualized, which was the vein graft supplying the  right coronary artery. There was no graft visualized, which supplied  the circumflex system verifying that this graft was occluded.  Treatments: surgery:   Redo median sternotomy, extracorporeal  circulation, coronary artery bypass graft surgery x1 using a saphenous  vein graft to the obtuse marginal branch of left circumflex coronary  artery. Endoscopic vein harvesting from the left leg  Disposition: 01-Home or Self Care  The patient has been discharged on:   1.Beta Blocker:  Yes [ x  ]                              No   [   ]                              If No, reason:  2.Ace Inhibitor/ARB: Yes [   ]                                     No  [  x  ]                                      If No, reason: Labile blood pressure  3.Statin:   Yes [x   ]                  No  [   ]  If No, reason:  4.Ecasa:  Yes  [ x  ]                  No   [   ]                  If No, reason:     Medication List     As of 09/23/2012  7:48 AM    STOP taking these medications         amLODipine 5 MG tablet   Commonly known as: NORVASC      hydrochlorothiazide 25 MG tablet   Commonly known as: HYDRODIURIL      isosorbide mononitrate 60 MG 24 hr tablet   Commonly known as: IMDUR      nitroGLYCERIN 0.4 MG SL tablet   Commonly known as: NITROSTAT      ranolazine 500 MG 12 hr tablet   Commonly known as: RANEXA      Ticagrelor 90 MG Tabs tablet   Commonly known as: BRILINTA      TAKE these medications         aspirin 325 MG EC tablet   Take 1 tablet (325 mg total) by mouth daily.      finasteride 5 MG tablet   Commonly known as: PROSCAR   Take 5 mg by mouth daily.      furosemide 20 MG tablet   Commonly known as: LASIX   Take 1 tablet (20 mg total) by mouth 2 (two) times daily. For 5 days      HYDROcodone-acetaminophen 5-500 MG per tablet   Commonly known as: VICODIN   Take 1 tablet by mouth every 6 (six) hours as needed. For pain      hydrOXYzine 25 MG tablet   Commonly known as: ATARAX/VISTARIL   Take 50 mg by mouth 3 (three) times daily as needed. For allergies.      metoprolol tartrate 25 MG tablet   Commonly known as: LOPRESSOR   Take 0.5 tablets (12.5 mg total) by mouth 2 (two) times daily.      omeprazole 20 MG capsule   Commonly known as: PRILOSEC   Take 20 mg by mouth 2 (two) times daily.      potassium chloride SA 20 MEQ tablet   Commonly known as: K-DUR,KLOR-CON   Take 1 tablet (20 mEq total) by mouth 2 (two) times daily. For 5 days      rosuvastatin 10 MG tablet   Commonly known as: CRESTOR   Take 20 mg by mouth daily at 6 PM.      terazosin 5 MG capsule   Commonly known as: HYTRIN   Take 5 mg by mouth at bedtime.              Follow-up Information    Follow up with Alleen Borne, MD. In 3 weeks. (Office will contact you with appointment)    Contact information:   [address] [employer] [address] [place] [address] [phone]       Follow up with [facility]. In 3 weeks. (Please get chest xray one hour prior to appointment with Dr. Laneta Green)    Contact information:   [address] [place] [address]       Follow up with Runell Gess, MD. Schedule an appointment as soon as possible for a visit in 2 weeks. (Contact office to set up 2 week follow up visit)    Contact information:   [address] [place] [address] [phone]          Signed: Lowella Dandy 09/22/2012, 11:40 AM

## 2012-09-22 NOTE — Progress Notes (Signed)
Subjective:  POD # 3 re do CABG X 1.  Objective:  Temp:  [98 F (36.7 C)-99.2 F (37.3 C)] 98.6 F (37 C) (11/17 0432) Pulse Rate:  [59-80] 68  (11/17 0432) Resp:  [14-20] 18  (11/17 0432) BP: (99-138)/(50-77) 115/68 mmHg (11/17 0432) SpO2:  [92 %-99 %] 92 % (11/17 0432) Weight:  [87.907 kg (193 lb 12.8 oz)] 87.907 kg (193 lb 12.8 oz) (11/17 0144) Weight change: 0.207 kg (7.3 oz)  Intake/Output from previous day: 11/16 0701 - 11/17 0700 In: 392 [P.O.:240; IV Piggyback:152] Out: 470 [Urine:470]  Intake/Output from this shift:    Physical Exam: General appearance: alert, cooperative and no distress Neck: no adenopathy, no carotid bruit, no JVD, supple, symmetrical, trachea midline and thyroid not enlarged, symmetric, no tenderness/mass/nodules Lungs: clear to auscultation bilaterally Heart: regular rate and rhythm, S1, S2 normal, no murmur, click, rub or gallop Extremities: 1+ LE edema  Lab Results: Results for orders placed during the hospital encounter of 09/10/12 (from the past 48 hour(s))  GLUCOSE, CAPILLARY     Status: Abnormal   Collection Time   09/20/12 12:21 PM      Component Value Range Comment   Glucose-Capillary 122 (*) 70 - 99 mg/dL    Comment 1 Documented in Chart      Comment 2 Notify RN     POCT I-STAT, CHEM 8     Status: Abnormal   Collection Time   09/20/12  4:53 PM      Component Value Range Comment   Sodium 138  135 - 145 mEq/L    Potassium 4.0  3.5 - 5.1 mEq/L    Chloride 102  96 - 112 mEq/L    BUN 20  6 - 23 mg/dL    Creatinine, Ser 1.61  0.50 - 1.35 mg/dL    Glucose, Bld 096 (*) 70 - 99 mg/dL    Calcium, Ion 0.45  4.09 - 1.30 mmol/L    TCO2 23  0 - 100 mmol/L    Hemoglobin 12.6 (*) 13.0 - 17.0 g/dL    HCT 81.1 (*) 91.4 - 52.0 %   MAGNESIUM     Status: Normal   Collection Time   09/20/12  4:56 PM      Component Value Range Comment   Magnesium 2.1  1.5 - 2.5 mg/dL   CBC     Status: Abnormal   Collection Time   09/20/12  4:56 PM   Component Value Range Comment   WBC 15.3 (*) 4.0 - 10.5 K/uL    RBC 3.98 (*) 4.22 - 5.81 MIL/uL    Hemoglobin 11.8 (*) 13.0 - 17.0 g/dL    HCT 78.2 (*) 95.6 - 52.0 %    MCV 88.2  78.0 - 100.0 fL    MCH 29.6  26.0 - 34.0 pg    MCHC 33.6  30.0 - 36.0 g/dL    RDW 21.3  08.6 - 57.8 %    Platelets 142 (*) 150 - 400 K/uL   CREATININE, SERUM     Status: Abnormal   Collection Time   09/20/12  4:56 PM      Component Value Range Comment   Creatinine, Ser 0.88  0.50 - 1.35 mg/dL    GFR calc non Af Amer 89 (*) >90 mL/min    GFR calc Af Amer >90  >90 mL/min   GLUCOSE, CAPILLARY     Status: Abnormal   Collection Time   09/20/12  5:04 PM  Component Value Range Comment   Glucose-Capillary 116 (*) 70 - 99 mg/dL    Comment 1 Documented in Chart      Comment 2 Notify RN     GLUCOSE, CAPILLARY     Status: Abnormal   Collection Time   09/20/12  8:21 PM      Component Value Range Comment   Glucose-Capillary 141 (*) 70 - 99 mg/dL    Comment 1 Notify RN      Comment 2 Documented in Chart     GLUCOSE, CAPILLARY     Status: Abnormal   Collection Time   09/21/12 12:17 AM      Component Value Range Comment   Glucose-Capillary 131 (*) 70 - 99 mg/dL    Comment 1 Notify RN      Comment 2 Documented in Chart     GLUCOSE, CAPILLARY     Status: Abnormal   Collection Time   09/21/12  4:07 AM      Component Value Range Comment   Glucose-Capillary 108 (*) 70 - 99 mg/dL    Comment 1 Notify RN      Comment 2 Documented in Chart     BASIC METABOLIC PANEL     Status: Abnormal   Collection Time   09/21/12  4:50 AM      Component Value Range Comment   Sodium 134 (*) 135 - 145 mEq/L    Potassium 3.7  3.5 - 5.1 mEq/L    Chloride 103  96 - 112 mEq/L    CO2 27  19 - 32 mEq/L    Glucose, Bld 99  70 - 99 mg/dL    BUN 23  6 - 23 mg/dL    Creatinine, Ser 1.61  0.50 - 1.35 mg/dL    Calcium 8.1 (*) 8.4 - 10.5 mg/dL    GFR calc non Af Amer >90  >90 mL/min    GFR calc Af Amer >90  >90 mL/min   CBC      Status: Abnormal   Collection Time   09/21/12  4:50 AM      Component Value Range Comment   WBC 12.2 (*) 4.0 - 10.5 K/uL    RBC 3.38 (*) 4.22 - 5.81 MIL/uL    Hemoglobin 10.1 (*) 13.0 - 17.0 g/dL    HCT 09.6 (*) 04.5 - 52.0 %    MCV 88.8  78.0 - 100.0 fL    MCH 29.9  26.0 - 34.0 pg    MCHC 33.7  30.0 - 36.0 g/dL    RDW 40.9  81.1 - 91.4 %    Platelets 135 (*) 150 - 400 K/uL   GLUCOSE, CAPILLARY     Status: Abnormal   Collection Time   09/21/12  8:14 AM      Component Value Range Comment   Glucose-Capillary 108 (*) 70 - 99 mg/dL   GLUCOSE, CAPILLARY     Status: Abnormal   Collection Time   09/21/12 12:40 PM      Component Value Range Comment   Glucose-Capillary 113 (*) 70 - 99 mg/dL   GLUCOSE, CAPILLARY     Status: Abnormal   Collection Time   09/21/12  3:44 PM      Component Value Range Comment   Glucose-Capillary 111 (*) 70 - 99 mg/dL   GLUCOSE, CAPILLARY     Status: Abnormal   Collection Time   09/21/12  8:49 PM      Component Value Range Comment   Glucose-Capillary 106 (*)  70 - 99 mg/dL   GLUCOSE, CAPILLARY     Status: Normal   Collection Time   09/22/12  6:01 AM      Component Value Range Comment   Glucose-Capillary 95  70 - 99 mg/dL   CBC     Status: Abnormal   Collection Time   09/22/12  7:42 AM      Component Value Range Comment   WBC 9.3  4.0 - 10.5 K/uL    RBC 3.25 (*) 4.22 - 5.81 MIL/uL    Hemoglobin 9.8 (*) 13.0 - 17.0 g/dL    HCT 08.6 (*) 57.8 - 52.0 %    MCV 88.9  78.0 - 100.0 fL    MCH 30.2  26.0 - 34.0 pg    MCHC 33.9  30.0 - 36.0 g/dL    RDW 46.9  62.9 - 52.8 %    Platelets 130 (*) 150 - 400 K/uL     Imaging: Imaging results have been reviewed  Assessment/Plan:   1. Principal Problem: 2.  *Unstable angina, on appropriate medical therapy, re-do CABG 09/19/12 X1 with SVG-OM 09/19/12 3. Active Problems: 4.  CAD, CABG X 09 Nov 2011. 5.  Smoker 6.  HTN (hypertension) 7.  ICM, EF 40% Jan 2013, now 50-55% Oct 2013 8.  NSTEMI 08/29/12 secondary  to occluded LM 9.  Dyslipidemia 10.  BPH (benign prostatic hyperplasia) 11.   Time Spent Directly with Patient:  20 minutes  Length of Stay:  LOS: 12 days   POD # 3 re do CABG X 1. Looks great!!! Ambulating. NSR. Exam benign. Labs OK. Progression, D/C per TCTS.  Runell Gess 09/22/2012, 8:39 AM

## 2012-09-22 NOTE — Progress Notes (Signed)
Removed pacing wires per MD order per hospital protocol. Pacing wires intact upon removal. Patient advised to remain in bed for 1 hour. VSS. Patient tolerated well. Will continue to monitor closely. Jeremy Johann, RN

## 2012-09-22 NOTE — Progress Notes (Addendum)
3 Days Post-Op Procedure(s) (LRB): REDO CORONARY ARTERY BYPASS GRAFTING (CABG) (N/A) Subjective:  [name] has no complaints this morning.  He is ambulating without difficulty, No BM  Objective: Vital signs in last 24 hours: Temp:  [98 F (36.7 C)-99.2 F (37.3 C)] 98.6 F (37 C) (11/17 0432) Pulse Rate:  [59-80] 68  (11/17 0432) Cardiac Rhythm:  [-] Normal sinus rhythm (11/16 1950) Resp:  [14-20] 18  (11/17 0432) BP: (99-138)/(50-77) 115/68 mmHg (11/17 0432) SpO2:  [92 %-99 %] 92 % (11/17 0432) Weight:  [193 lb 12.8 oz (87.907 kg)] 193 lb 12.8 oz (87.907 kg) (11/17 0144)    Intake/Output from previous day: 11/16 0701 - 11/17 0700 In: 392 [P.O.:240; IV Piggyback:152] Out: 470 [Urine:470]  General appearance: alert, cooperative and no distress Heart: regular rate and rhythm, S1, S2 normal, no murmur, click, rub or gallop Lungs: diminished breath sounds bibasilar Abdomen: soft, non-tender; bowel sounds normal; no masses,  no organomegaly Extremities: edema trace Wound: clean and dry  Lab Results:  [facility] 09/22/12 0742 09/21/12 0450  WBC 9.3 12.2*  HGB 9.8* 10.1*  HCT 28.9* 30.0*  PLT 130* 135*   BMET:  Basename 09/21/12 0450 09/20/12 1656 09/20/12 1653 09/20/12 0357  NA 134* -- 138 --  K 3.7 -- 4.0 --  CL 103 -- 102 --  CO2 27 -- -- 22  GLUCOSE 99 -- 136* --  BUN 23 -- 20 --  CREATININE 0.82 0.88 -- --  CALCIUM 8.1* -- -- 7.9*    PT/INR:  Basename 09/19/12 1330  LABPROT 16.0*  INR 1.31   ABG    Component Value Date/Time   PHART 7.360 09/19/2012 1748   HCO3 20.8 09/19/2012 1748   TCO2 23 09/20/2012 1653   ACIDBASEDEF 4.0* 09/19/2012 1748   O2SAT 98.0 09/19/2012 1748   CBG (last 3)   Basename 09/22/12 0601 09/21/12 2049 09/21/12 1544  GLUCAP 95 106* 111*    Assessment/Plan: S/P Procedure(s) (LRB): REDO CORONARY ARTERY BYPASS GRAFTING (CABG) (N/A)  1. CV- NSR rate and pressure controlled on Lopressor 2. Pulm- no acute issues, + atelectasis  continue IS 3. Volume Overload- weight is elevated from surgery, will start Lasix 4. Acute post operative anemia- stable 5 CBGs- will d/c fingersticks not a diabetic 6. Dispo- patient doing well, maintaining NSR will d/c EPW, if continues to progress can d/c home in the AM   LOS: 12 days    BARRETT, ERIN 09/22/2012   patient examined and medical record reviewed,agree with above note. VAN TRIGT III,PETER 09/22/2012

## 2012-09-23 ENCOUNTER — Encounter (HOSPITAL_COMMUNITY): Payer: Self-pay | Admitting: Surgery

## 2012-09-23 LAB — GLUCOSE, CAPILLARY

## 2012-09-23 MED FILL — Lidocaine HCl IV Inj 20 MG/ML: INTRAVENOUS | Qty: 5 | Status: AC

## 2012-09-23 MED FILL — Sodium Chloride IV Soln 0.9%: INTRAVENOUS | Qty: 1000 | Status: AC

## 2012-09-23 MED FILL — Mannitol IV Soln 20%: INTRAVENOUS | Qty: 500 | Status: AC

## 2012-09-23 MED FILL — Electrolyte-R (PH 7.4) Solution: INTRAVENOUS | Qty: 4000 | Status: AC

## 2012-09-23 MED FILL — Heparin Sodium (Porcine) Inj 1000 Unit/ML: INTRAMUSCULAR | Qty: 20 | Status: AC

## 2012-09-23 MED FILL — Heparin Sodium (Porcine) Inj 1000 Unit/ML: INTRAMUSCULAR | Qty: 30 | Status: AC

## 2012-09-23 MED FILL — Sodium Bicarbonate IV Soln 8.4%: INTRAVENOUS | Qty: 50 | Status: AC

## 2012-09-23 MED FILL — Sodium Chloride Irrigation Soln 0.9%: Qty: 3000 | Status: AC

## 2012-09-23 NOTE — Progress Notes (Signed)
[facility]  S/P redo CABG x 1. POD #4  Subjective: No complaints. Denies CP and SOB.  Objective: Vital signs in last 24 hours: Temp:  [98.6 F (37 C)-99.1 F (37.3 C)] 98.7 F (37.1 C) (11/18 0405) Pulse Rate:  [66-72] 66  (11/18 0405) Resp:  [19-20] 20  (11/18 0405) BP: (103-112)/(53-90) 108/63 mmHg (11/18 0405) SpO2:  [94 %-98 %] 94 % (11/18 0405) Weight:  [86.138 kg (189 lb 14.4 oz)] 86.138 kg (189 lb 14.4 oz) (11/18 0405) Last BM Date: 09/22/12  Intake/Output from previous day: 11/17 0701 - 11/18 0700 In: 720 [P.O.:720] Out: -  Intake/Output this shift:    Medications Current Facility-Administered Medications  Medication Dose Route Frequency Provider Last Rate Last Dose  . 0.9 %  sodium chloride infusion  250 mL Intravenous PRN Kerin Perna, MD      . acetaminophen (TYLENOL) tablet 1,000 mg  1,000 mg Oral Q6H Erin Barrett, PA   1,000 mg at 09/21/12 1234  . alum & mag hydroxide-simeth (MAALOX/MYLANTA) 200-200-20 MG/5ML suspension 30 mL  30 mL Oral Q6H PRN Abelino Derrick, PA   30 mL at 09/22/12 1610  . aspirin EC tablet 325 mg  325 mg Oral Daily Erin Barrett, PA   325 mg at 09/22/12 1109  . atorvastatin (LIPITOR) tablet 80 mg  80 mg Oral q1800 Nada Boozer, NP   80 mg at 09/22/12 1656  . bisacodyl (DULCOLAX) EC tablet 10 mg  10 mg Oral Daily Erin Barrett, PA   10 mg at 09/22/12 1108   Or  . bisacodyl (DULCOLAX) suppository 10 mg  10 mg Rectal Daily Erin Barrett, PA      . bisacodyl (DULCOLAX) EC tablet 10 mg  10 mg Oral Daily PRN Kerin Perna, MD       Or  . bisacodyl (DULCOLAX) suppository 10 mg  10 mg Rectal Daily PRN Kerin Perna, MD      . docusate sodium (COLACE) capsule 200 mg  200 mg Oral Daily Kerin Perna, MD   200 mg at 09/22/12 1108  . fluticasone (FLONASE) 50 MCG/ACT nasal spray 1 spray  1 spray Each Nare Daily Nada Boozer, NP   1 spray at 09/18/12 1018  . furosemide (LASIX) tablet 20 mg  20 mg Oral BID Erin  Barrett, PA   20 mg at 09/23/12 0750  . loratadine (CLARITIN) tablet 10 mg  10 mg Oral Daily Nada Boozer, NP   10 mg at 09/20/12 0949  . magnesium hydroxide (MILK OF MAGNESIA) suspension 30 mL  30 mL Oral Daily PRN Kerin Perna, MD      . metoprolol (LOPRESSOR) injection 2.5-5 mg  2.5-5 mg Intravenous Q2H PRN Erin Barrett, PA      . metoprolol tartrate (LOPRESSOR) tablet 12.5 mg  12.5 mg Oral BID Erin Barrett, PA   12.5 mg at 09/22/12 2211  . ondansetron (ZOFRAN) tablet 4 mg  4 mg Oral Q6H PRN Kerin Perna, MD       Or  . ondansetron [facility]) injection 4 mg  4 mg Intravenous Q6H PRN Kerin Perna, MD      . oxyCODONE (Oxy IR/ROXICODONE) immediate release tablet 5-10 mg  5-10 mg Oral Q3H PRN Lowella Dandy, PA   10 mg at 09/22/12 0623  . pantoprazole (PROTONIX) EC tablet 40 mg  40 mg Oral Daily Erin Barrett, PA   40 mg at 09/22/12 1110  . potassium chloride SA (K-DUR,KLOR-CON)  CR tablet 20 mEq  20 mEq Oral BID Lowella Dandy, PA   20 mEq at 09/22/12 2211  . sodium chloride 0.9 % injection 3 mL  3 mL Intravenous Q12H Kerin Perna, MD   3 mL at 09/22/12 2212  . sodium chloride 0.9 % injection 3 mL  3 mL Intravenous PRN Kerin Perna, MD      . terazosin (HYTRIN) capsule 5 mg  5 mg Oral QHS Rowe Clack, PA   5 mg at 09/22/12 2213  . traMADol (ULTRAM) tablet 50 mg  50 mg Oral Q6H PRN Kerin Perna, MD        PE: General appearance: alert, cooperative and no distress Lungs: clear to auscultation bilaterally Heart: regular rate and rhythm, S1, S2 normal, no murmur, click, rub or gallop Abdomen: + BS Extremities:  No LEE Pulses: 0 right PT and DPs.  Left DP 0.5 left PT 0.  Popliteals 2+. Skin: Skin color, texture, turgor normal. No rashes or lesions or warm and dry Neurologic: Grossly normal  Lab Results:   Basename 09/22/12 0742 09/21/12 0450 09/20/12 1656  WBC 9.3 12.2* 15.3*  HGB 9.8* 10.1* 11.8*  HCT 28.9* 30.0* 35.1*  PLT 130* 135* 142*   BMET  Basename 09/22/12 0742  09/21/12 0450 09/20/12 1656 09/20/12 1653  NA 135 134* -- 138  K 3.5 3.7 -- 4.0  CL 101 103 -- 102  CO2 27 27 -- --  GLUCOSE 104* 99 -- 136*  BUN 21 23 -- 20  CREATININE 0.70 0.82 0.88 --  CALCIUM 8.0* 8.1* -- --     Assessment/Plan  Principal Problem:  *Unstable angina, on appropriate medical therapy, re-do CABG 09/19/12 X1 with SVG-OM 09/19/12 Active Problems:  CAD, CABG X 09 Nov 2011.  Smoker  HTN (hypertension)  ICM, EF 40% Jan 2013, now 50-55% Oct 2013  NSTEMI 08/29/12 secondary to occluded LM  Dyslipidemia  BPH (benign prostatic hyperplasia)  Plan: S/P redo CABG x 1. POD # 4. No CP/SOB. BP and HR stable. Normal exam.  Pt has already been seen by CTS for today. Has been cleared for D/C per CTS.  Poor pedal pulses may warrant work up in the future, however feet are warm and he does not complain of claudication.  Will arrange FU.   LOS: 13 days    HAGER, BRYAN 09/23/2012 9:10 AM   Agree with note written by [facility]  Progressed nicely s/p re do CABG X 1. Exam benign. Labs OK. Home today. ROV with me 2-3 weeks.  Runell Gess 09/23/2012 10:07 AM

## 2012-09-23 NOTE — Progress Notes (Signed)
Pt is walking independently. Sts he does not need RW. O2 at rest 100%. Ed reviewed and pt voices understanding. Has a referral for CRPII however pt needs to see PCP at [facility] for MD to send allowance to do CRPII in [place] instead of [place].  [id number] [name] CES, ACSM

## 2012-09-23 NOTE — Progress Notes (Signed)
Pt discharge instructions and patient education complete. IV site d/c. Site WNL. Cardiac rehab arranged. CT sutures d/c. Steri strips applied. D/C home with wife. Dion Saucier

## 2012-09-23 NOTE — Progress Notes (Signed)
                   [address]            [place] [address]          [phone]      4 Days Post-Op Procedure(s) (LRB): REDO CORONARY ARTERY BYPASS GRAFTING (CABG) (N/A)  Subjective: Patient without complaints.  Objective: Vital signs in last 24 hours: Temp:  [98.6 F (37 C)-99.1 F (37.3 C)] 98.7 F (37.1 C) (11/18 0405) Pulse Rate:  [66-72] 66  (11/18 0405) Cardiac Rhythm:  [-] Normal sinus rhythm (11/17 2010) Resp:  [19-20] 20  (11/18 0405) BP: (103-112)/(53-90) 108/63 mmHg (11/18 0405) SpO2:  [94 %-98 %] 94 % (11/18 0405) Weight:  [189 lb 14.4 oz (86.138 kg)] 189 lb 14.4 oz (86.138 kg) (11/18 0405)  Pre op weight  84 kg Current Weight  09/23/12 189 lb 14.4 oz (86.138 kg)      Intake/Output from previous day: 11/17 0701 - 11/18 0700 In: 720 [P.O.:720] Out: -    Physical Exam:  Cardiovascular: RRR, no murmurs, gallops, or rubs. Pulmonary: Clear to auscultation bilaterally; no rales, wheezes, or rhonchi. Abdomen: Soft, non tender, bowel sounds present. Extremities: Mild bilateral lower extremity edema. Wounds: Clean and dry.  No erythema or signs of infection.  Lab Results: CBC: Basename 09/22/12 0742 09/21/12 0450  WBC 9.3 12.2*  HGB 9.8* 10.1*  HCT 28.9* 30.0*  PLT 130* 135*   BMET:  Basename 09/22/12 0742 09/21/12 0450  NA 135 134*  K 3.5 3.7  CL 101 103  CO2 27 27  GLUCOSE 104* 99  BUN 21 23  CREATININE 0.70 0.82  CALCIUM 8.0* 8.1*    PT/INR:  Lab Results  Component Value Date   INR 1.31 09/19/2012   INR 1.10 09/10/2012   INR 1.18 09/10/2012   ABG:  INR: Will add last result for INR, ABG once components are confirmed Will add last 4 CBG results once components are confirmed  Assessment/Plan:  1. CV - SR. On Lopressor 12.5 bid. Will not resume Ranexa or Brilinta at this time, unless cardiology decides otherwise 2.  Pulmonary - Encourage incentive spirometer 3. Volume Overload - Continue with diuresis 4.  Acute  blood loss anemia - Last H and H 9.8 and 28.9. 5.Mild thrombocytopenia-last platelet 130,000. 6.Discharge today  Karilyn Wind MPA-C 09/23/2012,7:20 AM

## 2012-09-30 ENCOUNTER — Other Ambulatory Visit: Payer: Self-pay | Admitting: *Deleted

## 2012-09-30 DIAGNOSIS — G8918 Other acute postprocedural pain: Secondary | ICD-10-CM

## 2012-09-30 MED ORDER — HYDROCODONE-ACETAMINOPHEN 5-500 MG PO TABS: 1.0000 | ORAL_TABLET | Freq: Four times a day (QID) | ORAL | Status: DC | PRN

## 2012-10-04 ENCOUNTER — Inpatient Hospital Stay (HOSPITAL_COMMUNITY)
Admission: EM | Admit: 2012-10-04 | Discharge: 2012-10-07 | DRG: 286 | Disposition: A | Discharge status: Home / Self Care | Payer: Non-veteran care | Attending: Cardiovascular Disease | Admitting: Cardiovascular Disease

## 2012-10-04 ENCOUNTER — Encounter (HOSPITAL_COMMUNITY): Payer: Self-pay | Admitting: Emergency Medicine

## 2012-10-04 ENCOUNTER — Encounter (HOSPITAL_COMMUNITY)
Admission: EM | Disposition: A | Discharge status: Home / Self Care | Payer: Self-pay | Source: Home / Self Care | Attending: Cardiovascular Disease

## 2012-10-04 ENCOUNTER — Emergency Department (HOSPITAL_COMMUNITY): Payer: Non-veteran care

## 2012-10-04 DIAGNOSIS — Z9861 Coronary angioplasty status: Secondary | ICD-10-CM

## 2012-10-04 DIAGNOSIS — E785 Hyperlipidemia, unspecified: Secondary | ICD-10-CM | POA: Diagnosis present

## 2012-10-04 DIAGNOSIS — I2510 Atherosclerotic heart disease of native coronary artery without angina pectoris: Principal | ICD-10-CM

## 2012-10-04 DIAGNOSIS — R079 Chest pain, unspecified: Secondary | ICD-10-CM

## 2012-10-04 DIAGNOSIS — Z951 Presence of aortocoronary bypass graft: Secondary | ICD-10-CM | POA: Diagnosis not present

## 2012-10-04 DIAGNOSIS — M5030 Other cervical disc degeneration, unspecified cervical region: Secondary | ICD-10-CM | POA: Diagnosis present

## 2012-10-04 DIAGNOSIS — R9431 Abnormal electrocardiogram [ECG] [EKG]: Secondary | ICD-10-CM | POA: Diagnosis not present

## 2012-10-04 DIAGNOSIS — I200 Unstable angina: Secondary | ICD-10-CM | POA: Diagnosis not present

## 2012-10-04 DIAGNOSIS — I10 Essential (primary) hypertension: Secondary | ICD-10-CM

## 2012-10-04 DIAGNOSIS — I214 Non-ST elevation (NSTEMI) myocardial infarction: Secondary | ICD-10-CM | POA: Diagnosis present

## 2012-10-04 DIAGNOSIS — I2582 Chronic total occlusion of coronary artery: Secondary | ICD-10-CM | POA: Diagnosis present

## 2012-10-04 DIAGNOSIS — Z9889 Other specified postprocedural states: Secondary | ICD-10-CM

## 2012-10-04 DIAGNOSIS — E7800 Pure hypercholesterolemia, unspecified: Secondary | ICD-10-CM | POA: Diagnosis present

## 2012-10-04 DIAGNOSIS — I249 Acute ischemic heart disease, unspecified: Secondary | ICD-10-CM | POA: Diagnosis present

## 2012-10-04 DIAGNOSIS — I5031 Acute diastolic (congestive) heart failure: Secondary | ICD-10-CM

## 2012-10-04 DIAGNOSIS — I509 Heart failure, unspecified: Secondary | ICD-10-CM | POA: Diagnosis present

## 2012-10-04 DIAGNOSIS — F4310 Post-traumatic stress disorder, unspecified: Secondary | ICD-10-CM | POA: Diagnosis present

## 2012-10-04 DIAGNOSIS — I255 Ischemic cardiomyopathy: Secondary | ICD-10-CM | POA: Diagnosis present

## 2012-10-04 DIAGNOSIS — D649 Anemia, unspecified: Secondary | ICD-10-CM | POA: Diagnosis present

## 2012-10-04 DIAGNOSIS — Z87891 Personal history of nicotine dependence: Secondary | ICD-10-CM | POA: Diagnosis present

## 2012-10-04 DIAGNOSIS — J9819 Other pulmonary collapse: Secondary | ICD-10-CM | POA: Diagnosis not present

## 2012-10-04 HISTORY — PX: LEFT HEART CATHETERIZATION WITH CORONARY/GRAFT ANGIOGRAM: SHX5450

## 2012-10-04 LAB — DIFFERENTIAL
Basophils Relative: 0 % (ref 0–1)
Eosinophils Relative: 0 % (ref 0–5)
Lymphocytes Relative: 14 % (ref 12–46)
Monocytes Absolute: 0.7 10*3/uL (ref 0.1–1.0)
Monocytes Relative: 7 % (ref 3–12)
Neutro Abs: 7.6 10*3/uL (ref 1.7–7.7)

## 2012-10-04 LAB — BASIC METABOLIC PANEL
BUN: 20 mg/dL (ref 6–23)
Calcium: 9 mg/dL (ref 8.4–10.5)
Chloride: 109 mEq/L (ref 96–112)
Creatinine, Ser: 1.09 mg/dL (ref 0.50–1.35)
GFR calc Af Amer: 80 mL/min — ABNORMAL LOW (ref >90)
GFR calc non Af Amer: 69 mL/min — ABNORMAL LOW (ref >90)

## 2012-10-04 LAB — CBC
HCT: 30.7 % — ABNORMAL LOW (ref 39.0–52.0)
Hemoglobin: 10 g/dL — ABNORMAL LOW (ref 13.0–17.0)
MCHC: 32.6 g/dL (ref 30.0–36.0)
MCV: 88.5 fL (ref 78.0–100.0)

## 2012-10-04 LAB — POCT I-STAT, CHEM 8
Calcium, Ion: 1.16 mmol/L (ref 1.13–1.30)
Creatinine, Ser: 1.1 mg/dL (ref 0.50–1.35)
Glucose, Bld: 124 mg/dL — ABNORMAL HIGH (ref 70–99)
Hemoglobin: 11.2 g/dL — ABNORMAL LOW (ref 13.0–17.0)
Potassium: 4.3 mEq/L (ref 3.5–5.1)

## 2012-10-04 LAB — TROPONIN I
Troponin I: <0.3 ng/mL (ref <0.30)
Troponin I: 0.35 ng/mL (ref <0.30)

## 2012-10-04 LAB — POCT ACTIVATED CLOTTING TIME: Activated Clotting Time: 140 seconds

## 2012-10-04 LAB — APTT: aPTT: 29 seconds (ref 24–37)

## 2012-10-04 LAB — MRSA PCR SCREENING: MRSA by PCR: NEGATIVE

## 2012-10-04 SURGERY — LEFT HEART CATHETERIZATION WITH CORONARY/GRAFT ANGIOGRAM
Anesthesia: LOCAL

## 2012-10-04 MED ORDER — ACETAMINOPHEN 325 MG PO TABS: 650.0000 mg | ORAL_TABLET | ORAL | Status: DC | PRN

## 2012-10-04 MED ORDER — FENTANYL CITRATE 0.05 MG/ML IJ SOLN
INTRAMUSCULAR | Status: AC
  Filled 2012-10-04: qty 2

## 2012-10-04 MED ORDER — EPTIFIBATIDE 75 MG/100ML IV SOLN
1.0000 ug/kg/min | INTRAVENOUS | Status: AC
  Filled 2012-10-04 (×3): qty 100

## 2012-10-04 MED ORDER — FUROSEMIDE 20 MG PO TABS
20.0000 mg | ORAL_TABLET | Freq: Two times a day (BID) | ORAL | Status: DC
  Administered 2012-10-05 – 2012-10-07 (×5): 20 mg via ORAL
  Filled 2012-10-04 (×7): qty 1

## 2012-10-04 MED ORDER — SODIUM CHLORIDE 0.9 % IJ SOLN: 3.0000 mL | INTRAMUSCULAR | Status: DC | PRN

## 2012-10-04 MED ORDER — PANTOPRAZOLE SODIUM 40 MG PO TBEC
40.0000 mg | DELAYED_RELEASE_TABLET | Freq: Every day | ORAL | Status: DC
  Administered 2012-10-04 – 2012-10-07 (×4): 40 mg via ORAL
  Filled 2012-10-04 (×4): qty 1

## 2012-10-04 MED ORDER — NITROGLYCERIN IN D5W 200-5 MCG/ML-% IV SOLN
3.0000 ug/min | INTRAVENOUS | Status: DC
  Administered 2012-10-04: 30 ug/min via INTRAVENOUS

## 2012-10-04 MED ORDER — HEPARIN (PORCINE) IN NACL 100-0.45 UNIT/ML-% IJ SOLN
1250.0000 [IU]/h | INTRAMUSCULAR | Status: DC
  Administered 2012-10-04: 1000 [IU]/h via INTRAVENOUS
  Filled 2012-10-04 (×3): qty 250

## 2012-10-04 MED ORDER — ASPIRIN 81 MG PO CHEW: 324.0000 mg | CHEWABLE_TABLET | ORAL | Status: DC

## 2012-10-04 MED ORDER — SODIUM CHLORIDE 0.9 % IV SOLN: 1.0000 mL/kg/h | INTRAVENOUS | Status: DC

## 2012-10-04 MED ORDER — EPTIFIBATIDE 75 MG/100ML IV SOLN
2.0000 ug/kg/min | INTRAVENOUS | Status: DC
  Administered 2012-10-04: 2 ug/kg/min via INTRAVENOUS
  Filled 2012-10-04 (×2): qty 100

## 2012-10-04 MED ORDER — NITROGLYCERIN 0.4 MG SL SUBL: 0.4000 mg | SUBLINGUAL_TABLET | SUBLINGUAL | Status: DC | PRN

## 2012-10-04 MED ORDER — MIDAZOLAM HCL 2 MG/2ML IJ SOLN
INTRAMUSCULAR | Status: AC
  Filled 2012-10-04: qty 2

## 2012-10-04 MED ORDER — FUROSEMIDE 10 MG/ML IJ SOLN
INTRAMUSCULAR | Status: AC
  Filled 2012-10-04: qty 4

## 2012-10-04 MED ORDER — ASPIRIN 300 MG RE SUPP
300.0000 mg | RECTAL | Status: DC
  Filled 2012-10-04: qty 1

## 2012-10-04 MED ORDER — SODIUM CHLORIDE 0.9 % IV SOLN: 1.0000 mL/kg/h | INTRAVENOUS | Status: AC

## 2012-10-04 MED ORDER — NITROGLYCERIN IN D5W 200-5 MCG/ML-% IV SOLN: 5.0000 ug/min | INTRAVENOUS | Status: DC

## 2012-10-04 MED ORDER — DIAZEPAM 5 MG PO TABS
5.0000 mg | ORAL_TABLET | ORAL | Status: AC
  Administered 2012-10-04: 5 mg via ORAL

## 2012-10-04 MED ORDER — HEPARIN BOLUS VIA INFUSION
4000.0000 [IU] | Freq: Once | INTRAVENOUS | Status: AC
  Administered 2012-10-04: 4000 [IU] via INTRAVENOUS

## 2012-10-04 MED ORDER — SODIUM CHLORIDE 0.9 % IV SOLN: 250.0000 mL | INTRAVENOUS | Status: DC

## 2012-10-04 MED ORDER — SODIUM CHLORIDE 0.9 % IV SOLN
INTRAVENOUS | Status: DC
Start: 2012-10-04 — End: 2012-10-04
  Administered 2012-10-04: 09:00:00 via INTRAVENOUS

## 2012-10-04 MED ORDER — SODIUM CHLORIDE 0.9 % IJ SOLN
3.0000 mL | INTRAMUSCULAR | Status: DC | PRN
Start: 2012-10-04 — End: 2012-10-04

## 2012-10-04 MED ORDER — NITROGLYCERIN IN D5W 200-5 MCG/ML-% IV SOLN
INTRAVENOUS | Status: AC
  Administered 2012-10-04: 5 ug
  Filled 2012-10-04: qty 250

## 2012-10-04 MED ORDER — HEPARIN (PORCINE) IN NACL 100-0.45 UNIT/ML-% IJ SOLN
1000.0000 [IU]/h | INTRAMUSCULAR | Status: DC
  Administered 2012-10-04: 1000 [IU]/h via INTRAVENOUS
  Filled 2012-10-04: qty 250

## 2012-10-04 MED ORDER — FINASTERIDE 5 MG PO TABS
5.0000 mg | ORAL_TABLET | Freq: Every day | ORAL | Status: DC
  Administered 2012-10-04 – 2012-10-07 (×4): 5 mg via ORAL
  Filled 2012-10-04 (×4): qty 1

## 2012-10-04 MED ORDER — CLOPIDOGREL BISULFATE 75 MG PO TABS
75.0000 mg | ORAL_TABLET | Freq: Every day | ORAL | Status: DC
  Administered 2012-10-05 – 2012-10-07 (×3): 75 mg via ORAL
  Filled 2012-10-04 (×4): qty 1

## 2012-10-04 MED ORDER — EPTIFIBATIDE BOLUS VIA INFUSION
180.0000 ug/kg | Freq: Once | INTRAVENOUS | Status: AC
  Administered 2012-10-04: 15100 ug via INTRAVENOUS
  Filled 2012-10-04: qty 21

## 2012-10-04 MED ORDER — NITROGLYCERIN IN D5W 200-5 MCG/ML-% IV SOLN
3.0000 ug/min | INTRAVENOUS | Status: AC
  Administered 2012-10-04: 20 ug/min via INTRAVENOUS

## 2012-10-04 MED ORDER — EPTIFIBATIDE 75 MG/100ML IV SOLN
2.0000 ug/kg/min | Freq: Once | INTRAVENOUS | Status: AC
  Administered 2012-10-04: 2 ug/kg/min via INTRAVENOUS
  Filled 2012-10-04: qty 100

## 2012-10-04 MED ORDER — HEPARIN (PORCINE) IN NACL 2-0.9 UNIT/ML-% IJ SOLN
INTRAMUSCULAR | Status: AC
  Filled 2012-10-04: qty 1000

## 2012-10-04 MED ORDER — SODIUM CHLORIDE 0.9 % IJ SOLN: 3.0000 mL | Freq: Two times a day (BID) | INTRAMUSCULAR | Status: DC

## 2012-10-04 MED ORDER — ASPIRIN EC 325 MG PO TBEC
325.0000 mg | DELAYED_RELEASE_TABLET | Freq: Every day | ORAL | Status: DC
  Filled 2012-10-04: qty 1

## 2012-10-04 MED ORDER — ONDANSETRON HCL 4 MG/2ML IJ SOLN: 4.0000 mg | Freq: Four times a day (QID) | INTRAMUSCULAR | Status: DC | PRN

## 2012-10-04 MED ORDER — SODIUM CHLORIDE 0.9 % IV SOLN: 250.0000 mL | INTRAVENOUS | Status: DC | PRN

## 2012-10-04 MED ORDER — LIDOCAINE HCL (PF) 1 % IJ SOLN
INTRAMUSCULAR | Status: AC
  Filled 2012-10-04: qty 30

## 2012-10-04 MED ORDER — ASPIRIN EC 81 MG PO TBEC: 81.0000 mg | DELAYED_RELEASE_TABLET | Freq: Every day | ORAL | Status: DC

## 2012-10-04 MED ORDER — FUROSEMIDE 10 MG/ML IJ SOLN
40.0000 mg | Freq: Once | INTRAMUSCULAR | Status: AC
  Administered 2012-10-04: 40 mg via INTRAVENOUS

## 2012-10-04 MED ORDER — DIAZEPAM 5 MG PO TABS
ORAL_TABLET | ORAL | Status: AC
  Filled 2012-10-04: qty 1

## 2012-10-04 MED ORDER — MORPHINE SULFATE 4 MG/ML IJ SOLN
4.0000 mg | Freq: Once | INTRAMUSCULAR | Status: AC
  Administered 2012-10-04: 4 mg via INTRAVENOUS
  Filled 2012-10-04: qty 1

## 2012-10-04 MED ORDER — ATORVASTATIN CALCIUM 20 MG PO TABS
20.0000 mg | ORAL_TABLET | Freq: Every day | ORAL | Status: DC
  Administered 2012-10-04 – 2012-10-06 (×3): 20 mg via ORAL
  Filled 2012-10-04 (×4): qty 1

## 2012-10-04 MED ORDER — NITROGLYCERIN 0.2 MG/ML ON CALL CATH LAB
INTRAVENOUS | Status: AC
  Filled 2012-10-04: qty 1

## 2012-10-04 MED ORDER — TERAZOSIN HCL 5 MG PO CAPS
5.0000 mg | ORAL_CAPSULE | Freq: Every day | ORAL | Status: DC
  Administered 2012-10-04 – 2012-10-05 (×2): 5 mg via ORAL
  Filled 2012-10-04 (×4): qty 1

## 2012-10-04 MED ORDER — ASPIRIN EC 325 MG PO TBEC
325.0000 mg | DELAYED_RELEASE_TABLET | Freq: Every day | ORAL | Status: DC
  Administered 2012-10-05 – 2012-10-07 (×3): 325 mg via ORAL
  Filled 2012-10-04 (×3): qty 1

## 2012-10-04 MED ORDER — SODIUM CHLORIDE 0.9 % IV SOLN
INTRAVENOUS | Status: DC
  Administered 2012-10-04: 10 mL/h via INTRAVENOUS

## 2012-10-04 MED ORDER — METOPROLOL TARTRATE 25 MG PO TABS
25.0000 mg | ORAL_TABLET | Freq: Two times a day (BID) | ORAL | Status: DC
  Administered 2012-10-04 – 2012-10-07 (×7): 25 mg via ORAL
  Filled 2012-10-04 (×8): qty 1

## 2012-10-04 NOTE — CV Procedure (Signed)
[facility] CARDIAC CATHETERIZATION REPORT  NAME:  [name]   MRN: [id number] DOB:  1946/02/07   ADMIT DATE: 10/04/2012 Procedure Date: 10/04/2012   INTERVENTIONAL CARDIOLOGIST: Marykay Lex, M.D., MS PRIMARY CARE PROVIDER: Provider Not In System PRIMARY CARDIOLOGIST: Nanetta Batty M.D.  PATIENT:  [name] is a 66 y.o. male with a past medical history significant for extensive problems with coronary artery disease starting in January of this year. He presented with an acute anterolateral myocardial infarction. Cardiac catheterization showed a 99% ostial stenosis of the left main coronary artery with a mid LAD stenosis and moderate right coronary artery stenosis. He required treatment with an aortic balloon pump and then underwent emergency three-vessel bypass surgery (LIMA to LAD, SVG to OM, SVG to RCA by Dr. [name]). Postoperative left ventricular ejection fraction was estimated to be about 50-55%.  He returned on October 24 with a non-ST segment elevation myocardial infarction. Repeat coronary driver she showed a total total occlusion of the left main coronary artery as well as total occlusion of the saphenous vein graft to the oblique marginal artery. There was a proximal M.D. artery stenosis that prevented adequate flow from the LIMA bypass to the LAD in a retrograde fashion to the left circumflex coronary artery. The bypass graft to the right coronary artery was patent. The patient continued to be very symptomatic on medical therapy and he underwent single vessel redo bypass surgery to the oblique marginal artery on October 14 and discharged on October 17. He states that he has not really felt well following the second procedure. He continues to have what she describes as a "deep" retrosternal pain that is quite a different from his incisional pain. Felt particularly bad tonight. Roughly 2 hours prior to arrival into the emergency room she began  experiencing progressively worsening chest pressure nausea and dyspnea he did not vomit. He took sublingual letter was sent home that provided minimal relief. He had more relief when he received a second nitroglycerin from EMS. But substantial reduction in his pain only occurred after he received intravenous morphine. On arrival to the ER chest pain severity was roughly 6-7/10. Chest discomfort persists not about 2/10 despite intravenous nitroglycerin. Intravenous heparin and Integrilin have also been initiated.  The electrocardiogram performed by EMS shows substantial downsloping ST segment depression in leads 12 and aVL V5 and V6 with T wave inversion in all those leads as well as V3 through V5 small Q waves are seen in leads V1 and V2 there is ST segment elevation in lead aVL. A followup electrocardiogram performed roughly 30 minutes later shows substantial improvement in the repolarization abnormalities with ST's back to the patient's previous baseline and upright T waves in all of the precordial leads. The initial point of care troponin I is negative  Currently he is awake alert and not in acute distress and feels notably better than in ER.Marland Kitchen He is referred for urgent cardiac catheterization.  PRE-OPERATIVE DIAGNOSIS:    Unstable Angina / ACS  PROCEDURES PERFORMED:    Left Heart Catheterization with Native Coronary & Graft Angiography  Left Ventriculography  PROCEDURE: Consent: Risks of procedure as well as the alternatives and risks of each were explained to the (patient/caregiver).  Consent for procedure obtained. Consent for signed by MD and patient with RN witness -- placed on chart.  PROCEDURE: The patient was brought to the 2nd Floor [facility] in the fasting state and prepped and draped in  the usual sterile fashion for Right groin access. Sterile technique was used including antiseptics, cap, gloves, gown, hand hygiene, mask and sheet.  Skin prep:  Chlorhexidine.  Time Out: Verified patient identification, verified procedure, site/side was marked, verified correct patient position, special equipment/implants available, medications/allergies/relevent history reviewed, required imaging and test results available.  Performed  Access: Right Common Femoral Artery; 5 Fr Sheath; Fluoroscopically guided,using Modified Seldinger technique. Diagnostic:  5 Fr Catheters advanced and exchanged over a wire.  Left Coronary Artery Angiography: unable to engage LM with EBU 3.5  Right Coronary Artery, SVG-RPDA, SVG-OM1 Angiography: JR4  LIMA-LAD Angiography: IMA catheter (over long-exchange wire after JR4 catheter redirected into Left Subclavian Artery.  LV Hemodynamics (LV Gram): Angled Pigtail  ANESTHESIA:   Local Lidocaine 18 ml SEDATION:  2 mg IV Versed, 50 mcg IV fentanyl ; Premedication: 5 mg PO Valium MEDICATIONS: Omnipaque Contrast --  Anti-Platelet Agent: Integrilin gtt.  Hemodynamics:  Central Aortic / Mean Pressures: 129/72 mmHg; 97 mmHg  Left Ventricular Pressures / EDP: 129/6 mmHg; 25 mmHg  Left Ventriculography:  EF: ~45-50 %  Wall Motion: Mild Anterolateral hypokinesis.  Coronary Anatomy:  Left Main: 100% Aorto-Ostial occlusion (previoulsy described) LAD: Fills via patent LIMA to mid-distal LAD; Antegrade flow reveals a "bifurcation into 2 equal sized vessels (LAD & D2); Retrograde flow reveals a large SP trunk distal to a very proximal D1, flow is noted to reach the ostially occluded Left Main with to-fro flow noted in the proximal Circumflex from the patent (new) SVG-OM1.  D1: Small to moderate caliber vesse; diffuse mild luminal irregularities.  D2: Moderate caliber vessel, covers distal anterolateral wall; mild luminal irregularities. Left Circumflex: Fills via SVG-OM1; Retrograde filling of native Circumflex reveals the graft attached to the largest of 3 branches from a Lateral OM2 then back fills up to the  proximal circumflex/occluded Left Main as well as down the AV Groove Circumflex that bifurcates into 2 small LPL branches.  The native Circumflex/OM system is otherwise relatively free of angiographically significant disease.  OM1: Large "trifurcating" lateral OM filled via the newly placed SVG-OM1; antegrade flow down the the largest "superior" branch reveals minimal luminal irregularities.  RCA: Leonides Schanz is a smaller moderate caliber vessel with diffuse luminal irregularities throughout. It terminates as what appears to be the Posterior Descending Artery with a small Posterior Lateral Branch; there was damping of the pressures with the insertion of the catheter which reveals likely police 40% ostial stenosis. There is no true evidence of to and fro flow from the vein graft.    Review of old films reveals that the RCA had a very high bifurcation into the posterior descending artery and Posterior Lateral Branch as a large RV Marginal Branch  RPDA:  small large-caliber vessel which tapers to the apex, diffuse luminal irregularities.   RPL Sysytem:The Right Posterior AV Groove Branch  fills via the SVG-RPL   Graft Anatomy:  LIMA-LAD: Widely patent, tortuous vessel.  SVG-distal RCA: the graft is patent although not clear what it is providing flow to. There is some retrograde flow up what appears to the native Right Coronary Artery.  SVG-OM1: Widely patent graft to extensive circumflex distribution. On some initial imaging there appeared to be a possible filling defect in the Lincoln proximal/ostial portion of this graft however with several manipulations it appeared to be free of any significant lesions.  5 Fr RFA Sheath to be removed in PACU Holding Area with direct manual pressure for hemostasis.  EBL:   < 10  ml  PATIENT DISPOSITION:    The patient was transferred to the PACU holding area in a hemodynamicaly stable, chest pain free condition.  The patient tolerated the procedure well, and  there were no complications.  The patient was stable before, during, and after the procedure.  POST-OPERATIVE DIAGNOSIS:    No angiographic evidence of new Obstructive CAD -- patent new SVG-OM1 with brisk TIMI 3 flow filling antegrade and retrograde perfusing the entire LCx territory.  Known Aorto-Ostial LM 100% occlusion -- unable to engage; with patent LIMA-LAD.  Mildly reduced EF with mild-moderately elevated LVEDP  PLAN OF CARE:  Given the dynamic ECG changes & symptoms will return to TCU for monitoring --   Continue Integrelin gtt x 18 hrs & restart Heparin 8 hrs post sheath pull, as I cannot exclude acute thrombus cleared with Integrilin & ASA as his Sx & ECG changes all but resolved with this Rx.  Continue to cycle cardiac biomarkers  IV NTG gtt overnight.  Will initiate DAPT with ASA & Plavix for prophylaxis  IV Lasix today & start PO Lasix in AM for elevated EDP that is likely a result of acute diastolic HF, potentially related to ACS.  TCU tonight, if stable tomorrow, will transfer to Tele with likely d/c Sunday.   Marykay Lex, M.D., M.S. [facility] [address] [place], [place]  [address]  [phone]  10/04/2012 10:36 AM

## 2012-10-04 NOTE — H&P (Signed)
[facility]       HISTORY AND PHYSICAL NOTE   Reason for admission: Unstable angina  Cardiologist: Nanetta Batty M.D.  HPI: This is a 66 y.o. male with a past medical history significant for extensive problems with coronary artery disease starting in January of this year. He presented with an acute anterolateral myocardial infarction. Cardiac catheterization showed a 99% ostial stenosis of the left main coronary artery with a mid LAD stenosis and moderate right coronary artery stenosis. He required treatment with an aortic balloon pump and then underwent emergency three-vessel bypass surgery (LIMA to LAD, SVG to OM, SVG to RCA by Dr. [name]). Postoperative left ventricular ejection fraction was estimated to be about 50-55%.  He returned on 2024-10-30with a non-ST segment elevation myocardial infarction. Repeat coronary driver she showed a total total occlusion of the left main coronary artery as well as total occlusion of the saphenous vein graft to the oblique marginal artery. There was a proximal M.D. artery stenosis that prevented adequate flow from the LIMA bypass to the LAD in a retrograde fashion to the left circumflex coronary artery. The bypass graft to the right coronary artery was patent. The patient continued to be very symptomatic on medical therapy and he underwent single vessel redo bypass surgery to the oblique marginal artery on October 14.  He was discharged from the hospital on October 17. He states that he has not really felt well following the second procedure. He continues to have what she describes as a "deep" retrosternal pain that is quite a different from his incisional pain. Felt particularly bad tonight. Roughly 2 hours prior to arrival into the emergency room she began experiencing progressively worsening chest pressure nausea and dyspnea he did not vomit. He took sublingual letter was sent home that provided minimal relief. He had  more relief when he received a second nitroglycerin from EMS. But substantial reduction in his pain only occurred after he received intravenous morphine. On arrival to the ER chest pain severity was roughly 6-7/10. Chest discomfort persists not about 2/10 despite intravenous nitroglycerin. Intravenous heparin and Integrilin have also been initiated.  The electrocardiogram performed by EMS shows substantial downsloping ST segment depression in leads 12 and aVL V5 and V6 with T wave inversion in all those leads as well as V3 through V5 small Q waves are seen in leads V1 and V2 there is ST segment elevation in lead aVL. A followup electrocardiogram performed roughly 30 minutes later shows substantial improvement in the repolarization abnormalities with ST's back to the patient's previous baseline and upright T waves in all of the precordial leads. The initial point of care troponin I is negative  Currently he is awake alert and not in acute distress but clearly uncomfortable and apprehensive.  PMHx:  Past Medical History  Diagnosis Date  . Hypertension   . Hypercholesterolemia   . Anginal pain   . DDD (degenerative disc disease), cervical   . Arthritis     "in my back and neck" (09/04/12)  . Malaria     "I've had it a couple times in the 1960's; almost died from it" (2012/09/04)  . PTSD (post-traumatic stress disorder)   . NSTEMI (non-ST elevated myocardial infarction) 11/2011    anterior  . NSTEMI (non-ST elevated myocardial infarction) 09/04/12  . Coronary artery disease    Past Surgical History  Procedure Date  . Back surgery   . Lumbar disc surgery 1980's?  Marland Kitchen  Tonsillectomy     "as a kid" (08/29/2012)  . Coronary artery bypass graft 12/02/2011    Procedure: CORONARY ARTERY BYPASS GRAFTING (CABG);  Surgeon: Alleen Borne, MD;  Location: [facility] OR;  Service: Open Heart Surgery;  Laterality: N/A;  . Cardiac catheterization 11/2011  . Cataract extraction w/ intraocular lens  implant,  bilateral 1990's  . Coronary artery bypass graft 09/19/2012    Procedure: REDO CORONARY ARTERY BYPASS GRAFTING (CABG);  Surgeon: Alleen Borne, MD;  Location: [facility] OR;  Service: Open Heart Surgery;  Laterality: N/A;    FAMHx: No family history on file.  SOCHx:  reports that he quit smoking about 10 months ago. His smoking use included Cigarettes. He has a 25 pack-year smoking history. He has never used smokeless tobacco. He reports that he drinks alcohol. He reports that he does not use illicit drugs.  ALLERGIES: No Known Allergies  ROS: Constitutional: negative for chills, fevers and night sweats Eyes: negative for visual disturbance Ears, nose, mouth, throat, and face: negative for epistaxis, hearing loss, sore throat and voice change Respiratory: positive for emphysema, negative for cough, hemoptysis, sputum, stridor and wheezing Cardiovascular: positive for chest pain, negative for claudication, irregular heart beat, orthopnea, palpitations, paroxysmal nocturnal dyspnea and syncope Gastrointestinal: positive for nausea, negative for abdominal pain, constipation, diarrhea, dysphagia, odynophagia and vomiting Genitourinary:negative for dysuria, hematuria and nocturia Hematologic/lymphatic: negative for bleeding and easy bruising Musculoskeletal:positive for Incisional pain, mild Neurological: negative for dizziness, gait problems, headaches, seizures, speech problems and vertigo Behavioral/Psych: negative Endocrine: negative  HOME MEDICATIONS:  (Not in a hospital admission)  HOSPITAL MEDICATIONS: I have reviewed the patient's current medications. Prior to Admission:  (Not in a hospital admission)  VITALS: Blood pressure 145/70, pulse 85, temperature 99.7 F (37.6 C), temperature source Oral, resp. rate 16, height 5\' 10"  (1.778 m), weight 83.915 kg (185 lb), SpO2 100.00%.  PHYSICAL EXAM: General appearance: alert, cooperative, mild distress and Color is slightly pale and  ashen Neck: no adenopathy, no carotid bruit, no JVD, supple, symmetrical, trachea midline, thyroid not enlarged, symmetric, no tenderness/mass/nodules and There is a bruit overlying the left subclavian artery Lungs: clear to auscultation bilaterally and The sternotomy scar is healing well Heart: regular rate and rhythm, S1, S2 normal, no murmur, click, rub or gallop Abdomen: soft, non-tender; bowel sounds normal; no masses,  no organomegaly Extremities: There is a 2+ pretibial edema in the left lower extremity and 1+ in the right lower showed Pulses: The left radial pulse appears weaker by palpation compared to the right, but there is no difference in blood pressure according in the 2 brachial arteries Skin: Skin color, texture, turgor normal. No rashes or lesions Neurologic: Alert and oriented X 3, normal strength and tone. Normal symmetric reflexes. Normal coordination and gait  LABS: Results for orders placed during the hospital encounter of 10/04/12 (from the past 48 hour(s))  CBC     Status: Abnormal   Collection Time   10/04/12  4:40 AM      Component Value Range Comment   WBC 9.7  4.0 - 10.5 K/uL    RBC 3.47 (*) 4.22 - 5.81 MIL/uL    Hemoglobin 10.0 (*) 13.0 - 17.0 g/dL    HCT 78.2 (*) 95.6 - 52.0 %    MCV 88.5  78.0 - 100.0 fL    MCH 28.8  26.0 - 34.0 pg    MCHC 32.6  30.0 - 36.0 g/dL    RDW 21.3 (*) 08.6 - 15.5 %  Platelets 384  150 - 400 K/uL   DIFFERENTIAL     Status: Abnormal   Collection Time   10/04/12  4:40 AM      Component Value Range Comment   Neutrophils Relative 79 (*) 43 - 77 %    Neutro Abs 7.6  1.7 - 7.7 K/uL    Lymphocytes Relative 14  12 - 46 %    Lymphs Abs 1.4  0.7 - 4.0 K/uL    Monocytes Relative 7  3 - 12 %    Monocytes Absolute 0.7  0.1 - 1.0 K/uL    Eosinophils Relative 0  0 - 5 %    Eosinophils Absolute 0.0  0.0 - 0.7 K/uL    Basophils Relative 0  0 - 1 %    Basophils Absolute 0.0  0.0 - 0.1 K/uL   PROTIME-INR     Status: Normal   Collection  Time   10/04/12  4:40 AM      Component Value Range Comment   Prothrombin Time 14.4  11.6 - 15.2 seconds    INR 1.14  0.00 - 1.49   APTT     Status: Normal   Collection Time   10/04/12  4:40 AM      Component Value Range Comment   aPTT 29  24 - 37 seconds   BASIC METABOLIC PANEL     Status: Abnormal   Collection Time   10/04/12  4:40 AM      Component Value Range Comment   Sodium 143  135 - 145 mEq/L    Potassium 4.2  3.5 - 5.1 mEq/L    Chloride 109  96 - 112 mEq/L    CO2 19  19 - 32 mEq/L    Glucose, Bld 130 (*) 70 - 99 mg/dL    BUN 20  6 - 23 mg/dL    Creatinine, Ser 7.82  0.50 - 1.35 mg/dL    Calcium 9.0  8.4 - 95.6 mg/dL    GFR calc non Af Amer 69 (*) >90 mL/min    GFR calc Af Amer 80 (*) >90 mL/min   TROPONIN I     Status: Normal   Collection Time   10/04/12  4:40 AM      Component Value Range Comment   Troponin I <0.30  <0.30 ng/mL   SAMPLE TO BLOOD BANK     Status: Normal   Collection Time   10/04/12  4:43 AM      Component Value Range Comment   Blood Bank Specimen SAMPLE AVAILABLE FOR TESTING      Sample Expiration 10/05/2012     POCT I-STAT TROPONIN I     Status: Normal   Collection Time   10/04/12  4:44 AM      Component Value Range Comment   Troponin i, poc 0.03  0.00 - 0.08 ng/mL    Comment 3            POCT I-STAT, CHEM 8     Status: Abnormal   Collection Time   10/04/12  4:46 AM      Component Value Range Comment   Sodium 144  135 - 145 mEq/L    Potassium 4.3  3.5 - 5.1 mEq/L    Chloride 112  96 - 112 mEq/L    BUN 20  6 - 23 mg/dL    Creatinine, Ser 2.13  0.50 - 1.35 mg/dL    Glucose, Bld 086 (*) 70 - 99 mg/dL  Calcium, Ion 1.16  1.13 - 1.30 mmol/L    TCO2 18  0 - 100 mmol/L    Hemoglobin 11.2 (*) 13.0 - 17.0 g/dL    HCT 01.0 (*) 27.2 - 52.0 %     IMAGING: Dg Chest Port 1 View  10/04/2012  *RADIOLOGY REPORT*  Clinical Data: Chest pain and shortness of breath.  PORTABLE CHEST - 1 VIEW  Comparison: Chest radiograph from 09/22/2012  Findings:  The lungs are well-aerated.  Mild vascular congestion is noted.  Mild recurrent left basilar atelectasis is suggested. There is no evidence of focal consolidation, pleural effusion or pneumothorax.  The cardiomediastinal silhouette is borderline enlarged; the patient is status post median sternotomy.  No acute osseous abnormalities are seen.  IMPRESSION: Mild vascular congestion and borderline cardiomegaly, with mild left basilar atelectasis; no focal airspace consolidation seen.   Original Report Authenticated By: Tonia Ghent, M.D.     IMPRESSION: 1. Unstable angina with electrocardiographic findings again suggestive of ischemia in the left circumflex coronary artery territory; the dynamic nature of his EKG changes suggests high risk for acute coronary event. He will continue on intravenous heparin and nitroglycerin and Integrilin with plans for early coronary angiography today. Will review his films with Dr. Bryan Lemma. At one point there was discussion of possible option to attempt to open the chronically occluded left main coronary artery and place a stent. This may be necessary if the newly placed vein graft to the oblique marginal artery is compromised. We'll also review the films for any signs of left subclavian artery stenosis which may be impairing left internal mammary artery flow since he has a bruit. Will also repeat an echocardiogram to see if there is evidence of significant mitral insufficiency which in turn may be related to papillary muscle dysfunction  Time Spent Directly with Patient: 45 minutes  Thurmon Fair, MD, [facility] Attending Cardiologist [facility] 10/04/2012, 6:28 AM

## 2012-10-04 NOTE — ED Notes (Signed)
Pt arrives by ems. Hx of 2 MI one in Jan and one in Oct of 2013. Pt had CABG 2 weeks ago. Cp started 2 hours ago and got progressively worse. Pt experiencing N and SOB. 12 lead reveals elevation in avR. Pt denies vomiting or syncope. Pt given 4 nitro and 324 asa pta. Pt appears ashen and shivering.also bilateral LE swelling. Pt is alert and oriented x4 .

## 2012-10-04 NOTE — Progress Notes (Signed)
Met pt's wife and stayed w/her until pt was stabilized and ready to receive company. Pt's wife asked for prayer and w/pt's permission Chaplain had prayer w/pt and family. Pt's family was thankful for visit.  Marjory Lies Chaplain

## 2012-10-04 NOTE — ED Notes (Signed)
Cardiologist at bedside.  

## 2012-10-04 NOTE — Progress Notes (Signed)
ANTICOAGULATION CONSULT NOTE - Follow up  Pharmacy Consult for Heparin Indication: chest pain/ACS  No Known Allergies  Patient Measurements: Height: 5\' 10"  (177.8 cm) Weight: 184 lb 11.9 oz (83.8 kg) IBW/kg (Calculated) : 73   Vital Signs: Temp: 98.2 F (36.8 C) (11/29 0745) Temp src: Oral (11/29 0745) BP: 125/70 mmHg (11/29 0900) Pulse Rate: 87  (11/29 0920)  Labs:  Basename 10/04/12 1007 10/04/12 0446 10/04/12 0440  HGB -- 11.2* 10.0*  HCT -- 33.0* 30.7*  PLT -- -- 384  APTT -- -- 29  LABPROT -- -- 14.4  INR -- -- 1.14  HEPARINUNFRC -- -- --  CREATININE -- 1.10 1.09  CKTOTAL -- -- --  CKMB -- -- --  TROPONINI 0.35* -- <0.30    Estimated Creatinine Clearance: 69.1 ml/min (by C-G formula based on Cr of 1.1). S/p CABG 11/09/2011 S/p redo CABG 09/19/2012  Medications:  ASA  Proscar  Lasix  Vicodin  Lopressor  Prilosec  KCl  Crestor  Hytrin  Assessment: 66 yo male with chest pain for Heparin and integrelin.  Heparin to be resumed 8 hours after sheath removed.  Integrelin to be continued 18 hours post cath.  Dose confirmed with Dr. Herbie Baltimore  Goal of Therapy:  Heparin level 0.3-0.5 while on Integrilin Monitor platelets by anticoagulation protocol: Yes   Plan:  Continue heparin at 1000 units/hr resuming at 1850 tonight.  Heparin level 6 hours later. Check heparin level and CBC in 6 hours later and adjust per goal.    Mickeal Skinner 10/04/2012,12:38 PM

## 2012-10-04 NOTE — Progress Notes (Signed)
UR Completed.  Manasseh Pittsley [name] [phone] 10/04/2012  

## 2012-10-04 NOTE — Progress Notes (Addendum)
ANTICOAGULATION CONSULT NOTE - Initial Consult  Pharmacy Consult for Heparin Indication: chest pain/ACS  No Known Allergies  Patient Measurements: Height: 5\' 10"  (177.8 cm) Weight: 185 lb (83.915 kg) IBW/kg (Calculated) : 73   Vital Signs: Temp: 99.7 F (37.6 C) (11/29 0452) Temp src: Oral (11/29 0452) BP: 114/82 mmHg (11/29 0442) Pulse Rate: 109  (11/29 0442)  Labs:  Basename 10/04/12 0446 10/04/12 0440  HGB 11.2* 10.0*  HCT 33.0* 30.7*  PLT -- 384  APTT -- 29  LABPROT -- 14.4  INR -- 1.14  HEPARINUNFRC -- --  CREATININE 1.10 --  CKTOTAL -- --  CKMB -- --  TROPONINI -- --    Estimated Creatinine Clearance: 69.1 ml/min (by C-G formula based on Cr of 1.1).   Medical History: Past Medical History  Diagnosis Date  . Hypertension   . Hypercholesterolemia   . Anginal pain   . DDD (degenerative disc disease), cervical   . Arthritis     "in my back and neck" (September 09, 2012)  . Malaria     "I've had it a couple times in the 1960's; almost died from it" (Sep 09, 2012)  . PTSD (post-traumatic stress disorder)   . NSTEMI (non-ST elevated myocardial infarction) 11/2011    anterior  . NSTEMI (non-ST elevated myocardial infarction) 09/09/12  . Coronary artery disease   . STEMI (ST elevation myocardial infarction)   S/p CABG 11/09/2011 S/p redo CABG 09/19/2012  Medications:  ASA  Proscar  Lasix  Vicodin  Lopressor  Prilosec  KCl  Crestor  Hytrin  Assessment: 66 yo male with chest pain for Heparin.  Integrilin bolus and infusion to begin as well.  Goal of Therapy:  Heparin level 0.3-0.5 while on Integrilin Monitor platelets by anticoagulation protocol: Yes   Plan:  Heparin 4000 units IV bolus, then 1000 units/hr Check heparin level and CBC in 6 hours.  Eddie Candle 10/04/2012,5:07 AM

## 2012-10-04 NOTE — ED Provider Notes (Signed)
History     CSN: [id number]  Arrival date & time 10/04/12  4098   First MD Initiated Contact with Patient 10/04/12 [phone]      Chief Complaint  Patient presents with  . Code STEMI    (Consider location/radiation/quality/duration/timing/severity/associated sxs/prior treatment) HPI 66 yo male with complicated past cardiac history presents to the ER via EMS with complaint of chest pain.  Pt with substernal chest pain over the last 3 nights, worsening last night in the last 2 hours. Pain tonight associated with shortness of breath, nausea. EMS with diffuse ST depressions and some elevation AVL and V2 in route. Patient has been using nitroglycerin sublingual that has relieved the pain somewhat as well as vicodin. Patient received 324 of aspirin and 2 sublingual from EMS which did not help the pain. She was diaphoretic and route and grey in color. Patient is 2 weeks out from CABG. Had his first CABG in January of this year. He has history of non-ST elevation MIs.  Past Medical History  Diagnosis Date  . Hypertension   . Hypercholesterolemia   . Anginal pain   . DDD (degenerative disc disease), cervical   . Arthritis     "in my back and neck" (Sep 09, 2012)  . Malaria     "I've had it a couple times in the 1960's; almost died from it" (2012-09-09)  . PTSD (post-traumatic stress disorder)   . NSTEMI (non-ST elevated myocardial infarction) 11/2011    anterior  . NSTEMI (non-ST elevated myocardial infarction) 09-09-2012  . Coronary artery disease   . STEMI (ST elevation myocardial infarction)     Past Surgical History  Procedure Date  . Back surgery   . Lumbar disc surgery 1980's?  . Tonsillectomy     "as a kid" (09-Sep-2012)  . Coronary artery bypass graft 12/02/2011    Procedure: CORONARY ARTERY BYPASS GRAFTING (CABG);  Surgeon: Alleen Borne, MD;  Location: [facility] OR;  Service: Open Heart Surgery;  Laterality: N/A;  . Cardiac catheterization 11/2011  . Cataract extraction w/ intraocular  lens  implant, bilateral 1990's  . Coronary artery bypass graft 09/19/2012    Procedure: REDO CORONARY ARTERY BYPASS GRAFTING (CABG);  Surgeon: Alleen Borne, MD;  Location: [facility] OR;  Service: Open Heart Surgery;  Laterality: N/A;    No family history on file.  History  Substance Use Topics  . Smoking status: Former Smoker -- 0.5 packs/day for 50 years    Types: Cigarettes    Quit date: 12/02/2011  . Smokeless tobacco: Never Used  . Alcohol Use: Yes     Comment: Sep 09, 2012 "haven't had a drink in > 25 years; never had problem w/it"      Review of Systems  Respiratory: Positive for shortness of breath.   Cardiovascular: Positive for chest pain, palpitations and leg swelling.  Musculoskeletal: Positive for arthralgias.  All other systems reviewed and are negative.     Allergies  Review of patient's allergies indicates no known allergies.  Home Medications   Current Outpatient Rx  Name  Route  Sig  Dispense  Refill  . ASPIRIN 325 MG PO TBEC   Oral   Take 1 tablet (325 mg total) by mouth daily.   30 tablet      . FINASTERIDE 5 MG PO TABS   Oral   Take 5 mg by mouth daily.         . FUROSEMIDE 20 MG PO TABS   Oral   Take 1 tablet (20 mg total)  by mouth 2 (two) times daily. For 5 days   10 tablet   0   . HYDROCODONE-ACETAMINOPHEN 5-500 MG PO TABS   Oral   Take 1 tablet by mouth every 6 (six) hours as needed (may take one or two tabs every 6 hours as needed for pain). For pain   40 tablet   0   . HYDROXYZINE HCL 25 MG PO TABS   Oral   Take 50 mg by mouth 3 (three) times daily as needed. For allergies.         Marland Kitchen METOPROLOL TARTRATE 25 MG PO TABS   Oral   Take 0.5 tablets (12.5 mg total) by mouth 2 (two) times daily.   30 tablet   1   . OMEPRAZOLE 20 MG PO CPDR   Oral   Take 20 mg by mouth 2 (two) times daily.         Marland Kitchen POTASSIUM CHLORIDE CRYS ER 20 MEQ PO TBCR   Oral   Take 1 tablet (20 mEq total) by mouth 2 (two) times daily. For 5 days   10  tablet   0   . ROSUVASTATIN CALCIUM 10 MG PO TABS   Oral   Take 20 mg by mouth daily at 6 PM.         . TERAZOSIN HCL 5 MG PO CAPS   Oral   Take 5 mg by mouth at bedtime.           BP 114/82  Pulse 109  Temp 99.7 F (37.6 C) (Oral)  Resp 20  Ht 5\' 10"  (1.778 m)  Wt 185 lb (83.915 kg)  BMI 26.54 kg/m2  SpO2 100%  Physical Exam  Nursing note and vitals reviewed. Constitutional: He is oriented to person, place, and time. He appears distressed.       Ill-appearing, diaphoretic, pale  HENT:  Head: Normocephalic and atraumatic.  Nose: Nose normal.  Mouth/Throat: Oropharynx is clear and moist.  Eyes: Conjunctivae normal and EOM are normal. Pupils are equal, round, and reactive to light.  Neck: Normal range of motion. Neck supple. No JVD present. No tracheal deviation present. No thyromegaly present.  Cardiovascular: Normal rate, regular rhythm, normal heart sounds and intact distal pulses.  Exam reveals no gallop and no friction rub.   No murmur heard. Pulmonary/Chest: Effort normal and breath sounds normal. No stridor. No respiratory distress. He has no wheezes. He has no rales. He exhibits no tenderness.       Recent CABG surgery incisions noted  Abdominal: Soft. Bowel sounds are normal. He exhibits no distension and no mass. There is no tenderness. There is no rebound and no guarding.  Musculoskeletal: Normal range of motion. He exhibits edema. He exhibits no tenderness.  Lymphadenopathy:    He has no cervical adenopathy.  Neurological: He is alert and oriented to person, place, and time. He exhibits normal muscle tone. Coordination normal.  Skin: No rash noted. He is diaphoretic. No erythema. There is pallor.  Psychiatric: He has a normal mood and affect. His behavior is normal. Judgment and thought content normal.    ED Course  Procedures (including critical care time)  CRITICAL CARE Performed by: Olivia Mackie   Total critical care time: 65  Critical care time  was exclusive of separately billable procedures and treating other patients.  Critical care was necessary to treat or prevent imminent or life-threatening deterioration.  Critical care was time spent personally by me on the following activities: development of treatment  plan with patient and/or surrogate as well as nursing, discussions with consultants, evaluation of patient's response to treatment, examination of patient, obtaining history from patient or surrogate, ordering and performing treatments and interventions, ordering and review of laboratory studies, ordering and review of radiographic studies, pulse oximetry and re-evaluation of patient's condition.  Labs Reviewed  CBC - Abnormal; Notable for the following:    RBC 3.47 (*)     Hemoglobin 10.0 (*)     HCT 30.7 (*)     RDW 15.8 (*)     All other components within normal limits  DIFFERENTIAL - Abnormal; Notable for the following:    Neutrophils Relative 79 (*)     All other components within normal limits  BASIC METABOLIC PANEL - Abnormal; Notable for the following:    Glucose, Bld 130 (*)     GFR calc non Af Amer 69 (*)     GFR calc Af Amer 80 (*)     All other components within normal limits  POCT I-STAT, CHEM 8 - Abnormal; Notable for the following:    Glucose, Bld 124 (*)     Hemoglobin 11.2 (*)     HCT 33.0 (*)     All other components within normal limits  PROTIME-INR  APTT  SAMPLE TO BLOOD BANK  POCT I-STAT TROPONIN I  TROPONIN I  HEPARIN LEVEL (UNFRACTIONATED)  CBC  MRSA PCR SCREENING  MRSA PCR SCREENING  TROPONIN I  TROPONIN I  TROPONIN I   Dg Chest Port 1 View  10/04/2012  *RADIOLOGY REPORT*  Clinical Data: Chest pain and shortness of breath.  PORTABLE CHEST - 1 VIEW  Comparison: Chest radiograph from 09/22/2012  Findings: The lungs are well-aerated.  Mild vascular congestion is noted.  Mild recurrent left basilar atelectasis is suggested. There is no evidence of focal consolidation, pleural effusion or  pneumothorax.  The cardiomediastinal silhouette is borderline enlarged; the patient is status post median sternotomy.  No acute osseous abnormalities are seen.  IMPRESSION: Mild vascular congestion and borderline cardiomegaly, with mild left basilar atelectasis; no focal airspace consolidation seen.   Original Report Authenticated By: Tonia Ghent, M.D.     Date: 10/04/2012  Rate: 95  Rhythm: normal sinus rhythm  QRS Axis: normal  Intervals: normal  ST/T Wave abnormalities: nonspecific ST changes  Conduction Disutrbances:none  Narrative Interpretation:   Old EKG Reviewed: unchanged    1. Chest pain       MDM  66 year old male initially called as a code STEMI based on his EMS EKG. STEMI was downgraded as his ST depressions and elevations had resolved on EKG done here in the emergency room along with improvement in his pain. Discuss case with [facility] will be admitting the patient. They request heparin and Integrilin drips. Patient and family updated on findings and plan.        Olivia Mackie, MD 10/04/12 [phone]

## 2012-10-04 NOTE — Interval H&P Note (Signed)
History and Physical Interval Note:  10/04/2012 8:53 AM  [name]  has presented today for surgery, with the diagnosis of Acute Coronary Syndrom / Unstable Angina. The various methods of treatment have been discussed with the patient and family. After consideration of risks, benefits and other options for treatment, the patient has consented to  Procedure(s) (LRB) with comments: LEFT HEART CATHETERIZATION WITH CORONARY/GRAFT ANGIOGRAM (N/A)  With Possible Percutaneous Coronary Intervention as a surgical intervention .  The patient's history has been reviewed, patient examined, no change in status, stable for surgery.  I have reviewed the patient's chart and labs.  Questions were answered to the patient's satisfaction.     Jaleeyah Munce W

## 2012-10-05 DIAGNOSIS — I2510 Atherosclerotic heart disease of native coronary artery without angina pectoris: Secondary | ICD-10-CM | POA: Diagnosis not present

## 2012-10-05 DIAGNOSIS — I200 Unstable angina: Secondary | ICD-10-CM | POA: Diagnosis not present

## 2012-10-05 DIAGNOSIS — R9431 Abnormal electrocardiogram [ECG] [EKG]: Secondary | ICD-10-CM | POA: Diagnosis not present

## 2012-10-05 DIAGNOSIS — I5031 Acute diastolic (congestive) heart failure: Secondary | ICD-10-CM | POA: Diagnosis present

## 2012-10-05 DIAGNOSIS — I249 Acute ischemic heart disease, unspecified: Secondary | ICD-10-CM | POA: Diagnosis present

## 2012-10-05 LAB — BASIC METABOLIC PANEL
BUN: 17 mg/dL (ref 6–23)
Calcium: 8.3 mg/dL — ABNORMAL LOW (ref 8.4–10.5)
GFR calc non Af Amer: >90 mL/min (ref >90)
Glucose, Bld: 89 mg/dL (ref 70–99)
Sodium: 142 mEq/L (ref 135–145)

## 2012-10-05 LAB — CBC
MCV: 87.7 fL (ref 78.0–100.0)
Platelets: 330 10*3/uL (ref 150–400)
RDW: 15.3 % (ref 11.5–15.5)
WBC: 7.4 10*3/uL (ref 4.0–10.5)

## 2012-10-05 LAB — HEPARIN LEVEL (UNFRACTIONATED)
Heparin Unfractionated: <0.1 IU/mL — ABNORMAL LOW (ref 0.30–0.70)
Heparin Unfractionated: 0.44 IU/mL (ref 0.30–0.70)

## 2012-10-05 LAB — TROPONIN I: Troponin I: <0.3 ng/mL (ref <0.30)

## 2012-10-05 MED ORDER — ISOSORBIDE MONONITRATE ER 30 MG PO TB24
30.0000 mg | ORAL_TABLET | Freq: Every day | ORAL | Status: DC
  Administered 2012-10-05 – 2012-10-07 (×3): 30 mg via ORAL
  Filled 2012-10-05 (×4): qty 1

## 2012-10-05 MED ORDER — HEPARIN (PORCINE) IN NACL 100-0.45 UNIT/ML-% IJ SOLN
1500.0000 [IU]/h | INTRAMUSCULAR | Status: AC
  Filled 2012-10-05 (×2): qty 250

## 2012-10-05 NOTE — Progress Notes (Signed)
ANTICOAGULATION CONSULT NOTE - Follow up  Pharmacy Consult for Heparin Indication: chest pain/ACS  No Known Allergies  Patient Measurements: Height: 5\' 10"  (177.8 cm) Weight: 184 lb 11.9 oz (83.8 kg) IBW/kg (Calculated) : 73   Vital Signs: Temp: 98.1 F (36.7 C) (11/29 2000) Temp src: Oral (11/29 2000) BP: 86/52 mmHg (11/30 0100) Pulse Rate: 68  (11/30 0100)  Labs:  Basename 10/05/12 0138 10/05/12 0113 10/04/12 1640 10/04/12 1007 10/04/12 0446 10/04/12 0440  HGB -- 9.3* -- -- 11.2* --  HCT -- 28.5* -- -- 33.0* 30.7*  PLT -- 330 -- -- -- 384  APTT -- -- -- -- -- 29  LABPROT -- -- -- -- -- 14.4  INR -- -- -- -- -- 1.14  HEPARINUNFRC -- <0.10* -- -- -- --  CREATININE -- -- -- -- 1.10 1.09  CKTOTAL -- -- -- -- -- --  CKMB -- -- -- -- -- --  TROPONINI <0.30 -- 0.48* 0.35* -- --    Estimated Creatinine Clearance: 69.1 ml/min (by C-G formula based on Cr of 1.1). S/p CABG 11/09/2011 S/p redo CABG 09/19/2012  Medications:  ASA  Proscar  Lasix  Vicodin  Lopressor  Prilosec  KCl  Crestor  Hytrin  Assessment: 66 yo male with chest pain on IV heparin and Integrelin s/p cath. Integrelin to be continued 18 hours post cath (until 05:00) today (11/30).   Heparin level (<0.1) is below-goal on 1000 units/hr. Platelets are stable.   Goal of Therapy:  Heparin level 0.3-0.5 while on Integrilin Monitor platelets by anticoagulation protocol: Yes   Plan:  1. Increase IV heparin to 1250 units/hr.  2. Heparin level in 6 hours.   Emeline Gins 10/05/2012,2:29 AM

## 2012-10-05 NOTE — Progress Notes (Signed)
ANTICOAGULATION CONSULT NOTE - Follow up  Pharmacy Consult for Heparin Indication: chest pain/ACS  No Known Allergies  Patient Measurements: Height: 5\' 10"  (177.8 cm) Weight: 184 lb 11.9 oz (83.8 kg) IBW/kg (Calculated) : 73   Vital Signs: Temp: 97.9 F (36.6 C) (11/30 0739) Temp src: Oral (11/30 0739) BP: 97/53 mmHg (11/30 0700) Pulse Rate: 69  (11/30 0700)  Labs:  Basename 10/05/12 0900 10/05/12 0503 10/05/12 0138 10/05/12 0113 10/04/12 1640 10/04/12 0446 10/04/12 0440  HGB -- -- -- 9.3* -- 11.2* --  HCT -- -- -- 28.5* -- 33.0* 30.7*  PLT -- -- -- 330 -- -- 384  APTT -- -- -- -- -- -- 29  LABPROT -- -- -- -- -- -- 14.4  INR -- -- -- -- -- -- 1.14  HEPARINUNFRC 0.20* -- -- <0.10* -- -- --  CREATININE -- 0.81 -- -- -- 1.10 1.09  CKTOTAL -- -- -- -- -- -- --  CKMB -- -- -- -- -- -- --  TROPONINI -- <0.30 <0.30 -- 0.48* -- --    Estimated Creatinine Clearance: 93.9 ml/min (by C-G formula based on Cr of 0.81). S/p CABG 11/09/2011 S/p redo CABG 09/19/2012  Medications:  ASA  Proscar  Lasix  Vicodin  Lopressor  Prilosec  KCl  Crestor  Hytrin  Assessment: 66 yo male with chest pain on IV heparin and Integrelin s/p cath. Integrelin stopped.  Heparin level (0.2) is below-goal on 1250 units/hr. Platelets are stable.   Goal of Therapy:  Heparin level 0.3-0.7 since Integrelin is stopped Monitor platelets by anticoagulation protocol: Yes   Plan:  1. Increase IV heparin to 1500 units/hr.  2. Heparin level in 6 hours.   Mickeal Skinner 10/05/2012,10:59 AM

## 2012-10-05 NOTE — Progress Notes (Signed)
Pt arrived to 4738 via wheelchair from 2900. Pt has no complaints of pain, VSS and pt oriented to 4700.  Will con't to monitor.

## 2012-10-05 NOTE — Progress Notes (Signed)
[facility] DAILY PROGRESS NOTE  NAME:  [name]   MRN: [id number] DOB:  1946-09-04   ADMIT DATE: 10/04/2012   Patient Description   66 y.o. male with PMH below: presented with substernal chest pressure & SOB c/w ACS/Unstable Angina ~6 wks post re-do CABG.      Past Medical History  Diagnosis Date  . Hypertension   . Hypercholesterolemia   . Anginal pain   . DDD (degenerative disc disease), cervical   . Arthritis     "in my back and neck" (09-12-2012)  . Malaria     "I've had it a couple times in the 1960's; almost died from it" (Sep 12, 2012)  . PTSD (post-traumatic stress disorder)   . NSTEMI (non-ST elevated myocardial infarction) 11/2011    anterior  . NSTEMI (non-ST elevated myocardial infarction) 2012-09-12  . Coronary artery disease     Clinical Course:  To cath lab yesterday -- no obvious lesions (new graft patent). Elevated LVEDP.  Continued on Heparin & Integrelin o/n along with NTG gtt.  IV lasix yesterday with good UOP.   Length of Stay:  LOS: 1 day   Subjective:   Today [name] feels much better.  Slept well last PM.  No more chest discomfort or SOB.  Objective:  Temp:  [97.9 F (36.6 C)-98.6 F (37 C)] 97.9 F (36.6 C) (11/30 0739) Pulse Rate:  [63-97] 69  (11/30 0700) Resp:  [2-22] 19  (11/30 0700) BP: (86-143)/(50-78) 97/53 mmHg (11/30 0700) SpO2:  [98 %-100 %] 99 % (11/30 0700) Weight change: -0.115 kg (-4.1 oz) Physical Exam: General appearance: alert, cooperative, appears stated age, no distress and pleasant mood & affect.  No longer pale. Neck: no adenopathy, no carotid bruit, no JVD and supple, symmetrical, trachea midline Lungs: clear to auscultation bilaterally, normal percussion bilaterally and non-labored Heart: regular rate and rhythm, S1, S2 normal, no murmur, click, rub or gallop Abdomen: soft, non-tender; bowel sounds normal; no masses,  no organomegaly Extremities: extremities normal, atraumatic, no  cyanosis or edema Pulses: 2+ and symmetric Neurologic: Grossly normal  Intake/Output from previous day: 11/29 0701 - 11/30 0700 In: 1781.8 [P.O.:720; I.V.:1061.8] Out: 2550 [Urine:2550]  Intake/Output Summary (Last 24 hours) at 10/05/12 0859 Last data filed at 10/05/12 0600  Gross per 24 hour  Intake 1775.76 ml  Output   2550 ml  Net -774.24 ml     Lab 10/05/12 0503 10/04/12 0446 10/04/12 0440  NA 142 144 143  K 3.4* 4.3 4.2  CL 109 112 109  CO2 24 -- 19  BUN 17 20 20   CREATININE 0.81 1.10 1.09     Lab 10/05/12 0113 10/04/12 0446 10/04/12 0440  WBC 7.4 -- 9.7  HGB 9.3* 11.2* 10.0*  HCT 28.5* 33.0* 30.7*  PLT 330 -- 384   No components found with this basename: TROP:3, BNP:3    Lab 10/05/12 0503 10/05/12 0138 10/04/12 1640 10/04/12 1007 10/04/12 0440  TROPONINI <0.30 <0.30 0.48* 0.35* <0.30    Imaging: No new Imaging.  Cath 11/29:  No angiographic evidence of new Obstructive CAD -- patent new SVG-OM1 with brisk TIMI 3 flow filling antegrade and retrograde perfusing the entire LCx territory.  Known Aorto-Ostial LM 100% occlusion -- unable to engage; with patent LIMA-LAD.  Mildly reduced EF with mild-moderately elevated LVEDP  ECG this AM - NSR, continues to have anterolateral TWI & mild ST depressions despite no Sx.    MAR Reviewed  Assessment/Plan:  Principal Problem:  *  Acute coronary syndrome  Active Problems:  S/P CABG x 3: LIMA TO LAD, SVG TO OM, SVG TO RCA., VG to OM occluded, jeopardized LCX, by cath 08/29/12  Unstable angina, on appropriate medical therapy, re-do CABG 09/19/12 X1 with SVG-OM 09/19/12  Acute diastolic HF (heart failure) - likely cause of ACS; resolved  Smoker  HTN (hypertension)  ICM, EF 40% Jan 2013, now 50-55% Oct 2013  Dyslipidemia  Mild troponin elevation - more likely due to delayed volume overload / acute diastolic HF (given elevated LVEDP leading to increased wall stress & microvascular / endocardial ischemia)   Integrelin  gtt complete - Heparin until tomorrow AM.  Wean off NTG gtt - change to PO Imdur  Is Lasix naive, will convert to PO lasix today (may need PM IV dose, if unresponsive).  BP is borderline hypotensive -- will need to see how it does off of NTG gtt; only on low dose BB  Have Added Plavix for additional prophylactic coverage.  On statin.  Ambulate & transfer to Tele today -- anticipate d/c as early as tomorrow if continues to progress well.  Marykay Lex, M.D., M.S. [facility] [address] [place], [place]  [address]  [phone] Pager # [phone] 10/05/2012 9:03 AM    Time Spent Directly with Patient:  20 minutes   HARDING,DAVID W, M.D., M.S. [facility] [address] [place], [place]  [address]  [phone]  10/05/2012 8:59 AM

## 2012-10-05 NOTE — Progress Notes (Signed)
ANTICOAGULATION CONSULT NOTE - Follow Up Consult  Pharmacy Consult for heparin Indication: chest pain/ACS  No Known Allergies  Patient Measurements: Height: 5\' 10"  (177.8 cm) Weight: 182 lb 1.6 oz (82.6 kg) IBW/kg (Calculated) : 73   Vital Signs: Temp: 97.9 F (36.6 C) (11/30 1333) Temp src: Oral (11/30 1333) BP: 105/66 mmHg (11/30 1333) Pulse Rate: 67  (11/30 1333)  Labs:  Basename 10/05/12 1738 10/05/12 0900 10/05/12 0503 10/05/12 0138 10/05/12 0113 10/04/12 1640 10/04/12 0446 10/04/12 0440  HGB -- -- -- -- 9.3* -- 11.2* --  HCT -- -- -- -- 28.5* -- 33.0* 30.7*  PLT -- -- -- -- 330 -- -- 384  APTT -- -- -- -- -- -- -- 29  LABPROT -- -- -- -- -- -- -- 14.4  INR -- -- -- -- -- -- -- 1.14  HEPARINUNFRC 0.44 0.20* -- -- <0.10* -- -- --  CREATININE -- -- 0.81 -- -- -- 1.10 1.09  CKTOTAL -- -- -- -- -- -- -- --  CKMB -- -- -- -- -- -- -- --  TROPONINI -- -- <0.30 <0.30 -- 0.48* -- --    Estimated Creatinine Clearance: 93.9 ml/min (by C-G formula based on Cr of 0.81).   Medications:  Scheduled:    . aspirin EC  325 mg Oral Daily  . atorvastatin  20 mg Oral q1800  . clopidogrel  75 mg Oral Q breakfast  . [EXPIRED] diazepam      . finasteride  5 mg Oral Daily  . [EXPIRED] furosemide      . furosemide  20 mg Oral BID  . isosorbide mononitrate  30 mg Oral Daily  . metoprolol  25 mg Oral BID  . pantoprazole  40 mg Oral Daily  . terazosin  5 mg Oral QHS   Infusions:    . sodium chloride 10 mL/hr (10/04/12 1500)  . [EXPIRED] eptifibatide 0.998 mcg/kg/min (10/04/12 1400)  . heparin    . [EXPIRED] nitroGLYCERIN 20 mcg/min (10/04/12 1200)  . [DISCONTINUED] heparin 1,250 Units/hr (10/05/12 4098)    Assessment: 66 yo male with chest pain /ACS is currently on therapeutic heparin.  Heparin level was 0.44  Goal of Therapy:  Heparin level 0.3-0.7 units/ml Monitor platelets by anticoagulation protocol: Yes   Plan:  1) Continue heparin at 1500 units/hr to be stopped at  0400 on 10/06/12   Jesseka Drinkard, Tsz-Yin 10/05/2012,6:04 PM

## 2012-10-05 NOTE — Progress Notes (Signed)
CARDIAC REHAB PHASE I   PRE:  Rate/Rhythm: SR 71  BP:  Supine: 113/58  Sitting:   Standing:    SaO2: 100 Room Air  MODE:  Ambulation: 460 ft   POST:  Rate/Rhythem: Sinus Rhythm 65  BP:  Supine: 111/51  Sitting  Standing:    SaO2: 100 Room air  Cardiac rehab phase 1 1330-1350  Order received and appreciated.  Patient ambulated in the hallway without complaints of chest pain.  Patient assisted back to bed with call light within reach. No additional education information requested by [name].  Permission granted by patient to contact about phase 2 after dsicharge.   Harlon Flor, Arta Bruce

## 2012-10-06 DIAGNOSIS — R9431 Abnormal electrocardiogram [ECG] [EKG]: Secondary | ICD-10-CM | POA: Diagnosis not present

## 2012-10-06 DIAGNOSIS — I200 Unstable angina: Secondary | ICD-10-CM | POA: Diagnosis not present

## 2012-10-06 DIAGNOSIS — D649 Anemia, unspecified: Secondary | ICD-10-CM | POA: Diagnosis present

## 2012-10-06 DIAGNOSIS — I2510 Atherosclerotic heart disease of native coronary artery without angina pectoris: Secondary | ICD-10-CM | POA: Diagnosis not present

## 2012-10-06 HISTORY — PX: CORONARY ANGIOPLASTY: SHX604

## 2012-10-06 LAB — BASIC METABOLIC PANEL
CO2: 24 mEq/L (ref 19–32)
Calcium: 8.5 mg/dL (ref 8.4–10.5)
Creatinine, Ser: 0.76 mg/dL (ref 0.50–1.35)
Glucose, Bld: 93 mg/dL (ref 70–99)

## 2012-10-06 LAB — CBC
MCH: 28.1 pg (ref 26.0–34.0)
MCV: 87.5 fL (ref 78.0–100.0)
Platelets: 306 10*3/uL (ref 150–400)
RDW: 15.3 % (ref 11.5–15.5)

## 2012-10-06 MED ORDER — POTASSIUM CHLORIDE CRYS ER 20 MEQ PO TBCR
40.0000 meq | EXTENDED_RELEASE_TABLET | Freq: Once | ORAL | Status: AC
  Administered 2012-10-06: 40 meq via ORAL
  Filled 2012-10-06: qty 2

## 2012-10-06 NOTE — Progress Notes (Signed)
I have seen and evaluated the patient this morning along with Corine Shelter, PA. I agree with his findings, examination as well as impression recommendations.  He is feeling well.  No further Angina or SOB.  Diuresed relatively well on PO Lasix.   Anticoagulants have completed -- unfortunately his Hgb has dropped -- will need to monitor this at least 1 more day to ensure that it is stable.  With brief SVT run o/n - unable to up-titrate BB dose due to low BP; keep close eye on this -- may indicate need to do so as OP.  He did not feel it.  I suspect his Unstable Angina was related to post-operative volume overload / acute diastolic HF & not true thrombotic ACS event. If Hgb continues to drift, will hold Plavix, since this was started for 2nd'ry prevention.  Marykay Lex, M.D., M.S. [facility] [address] [place], [place]  [address]  [phone] Pager # [phone] 10/06/2012 10:43 AM

## 2012-10-06 NOTE — Progress Notes (Signed)
Subjective:  No angina  Objective:  Vital Signs in the last 24 hours: Temp:  [97.9 F (36.6 C)-98.5 F (36.9 C)] 98.2 F (36.8 C) (12/01 0556) Pulse Rate:  [67-70] 70  (11/30 2003) Resp:  [18-20] 18  (12/01 0556) BP: (92-124)/(52-66) 92/52 mmHg (12/01 0556) SpO2:  [100 %] 100 % (12/01 0556) Weight:  [81.602 kg (179 lb 14.4 oz)-82.6 kg (182 lb 1.6 oz)] 81.602 kg (179 lb 14.4 oz) (12/01 0556)  Intake/Output from previous day:  Intake/Output Summary (Last 24 hours) at 10/06/12 1027 Last data filed at 10/06/12 0852  Gross per 24 hour  Intake    480 ml  Output   1325 ml  Net   -845 ml    Physical Exam: General appearance: alert, cooperative and no distress Lungs: clear to auscultation bilaterally Heart: regular rate and rhythm   Rate: 70  Rhythm: normal sinus rhythm and 18 beats of PSVT yesterday  Lab Results:  Basename 10/06/12 0549 10/05/12 0113  WBC 6.8 7.4  HGB 8.8* 9.3*  PLT 306 330    Basename 10/06/12 0549 10/05/12 0503  NA 139 142  K 3.5 3.4*  CL 107 109  CO2 24 24  GLUCOSE 93 89  BUN 13 17  CREATININE 0.76 0.81    Basename 10/05/12 0503 10/05/12 0138  TROPONINI <0.30 <0.30   Hepatic Function Panel No results found for this basename: PROT,ALBUMIN,AST,ALT,ALKPHOS,BILITOT,BILIDIR,IBILI in the last 72 hours No results found for this basename: CHOL in the last 72 hours  Basename 10/04/12 0440  INR 1.14    Imaging: Imaging results have been reviewed  Cardiac Studies:  Assessment/Plan:   Principal Problem:  *Acute coronary syndrome  Active Problems:  CAD, CABG X 09 Nov 2011. re-do CABG X1 09/19/12  Acute diastolic HF (heart failure) - likely cause of ACS; resolved  Anemia  Smoker  HTN (hypertension)  ICM, EF 40% Jan 2013, now 50-55% Oct 2013  Dyslipidemia   Plan- Per Dr Herbie Baltimore- replace K+, continue beta blocker, check stool guaiac and CBC in am.   Corine Shelter PA-C 10/06/2012, 10:27 AM

## 2012-10-06 NOTE — Progress Notes (Addendum)
Page PA, Pt does not have VTE. (1800) PA stated none at this time. Pt is ambulatory and anemic at this time,. Will continue to monitor

## 2012-10-07 DIAGNOSIS — I2510 Atherosclerotic heart disease of native coronary artery without angina pectoris: Secondary | ICD-10-CM | POA: Diagnosis not present

## 2012-10-07 DIAGNOSIS — E782 Mixed hyperlipidemia: Secondary | ICD-10-CM | POA: Diagnosis not present

## 2012-10-07 DIAGNOSIS — I2589 Other forms of chronic ischemic heart disease: Secondary | ICD-10-CM | POA: Diagnosis not present

## 2012-10-07 LAB — CBC
HCT: 30.1 % — ABNORMAL LOW (ref 39.0–52.0)
Hemoglobin: 9.5 g/dL — ABNORMAL LOW (ref 13.0–17.0)
MCH: 27.5 pg (ref 26.0–34.0)
MCHC: 31.6 g/dL (ref 30.0–36.0)
MCV: 87 fL (ref 78.0–100.0)
Platelets: 309 10*3/uL (ref 150–400)
RBC: 3.46 MIL/uL — ABNORMAL LOW (ref 4.22–5.81)
RDW: 15.1 % (ref 11.5–15.5)
WBC: 7.5 10*3/uL (ref 4.0–10.5)

## 2012-10-07 LAB — BASIC METABOLIC PANEL
BUN: 13 mg/dL (ref 6–23)
Calcium: 8.7 mg/dL (ref 8.4–10.5)
GFR calc non Af Amer: >90 mL/min (ref >90)
Glucose, Bld: 91 mg/dL (ref 70–99)

## 2012-10-07 MED ORDER — FUROSEMIDE 20 MG PO TABS: 20.0000 mg | ORAL_TABLET | Freq: Two times a day (BID) | ORAL | Status: DC

## 2012-10-07 MED ORDER — ROSUVASTATIN CALCIUM 10 MG PO TABS: 20.0000 mg | ORAL_TABLET | Freq: Every day | ORAL | Status: DC

## 2012-10-07 MED ORDER — NITROGLYCERIN 0.4 MG SL SUBL: 0.4000 mg | SUBLINGUAL_TABLET | SUBLINGUAL | Status: AC | PRN

## 2012-10-07 MED ORDER — CLOPIDOGREL BISULFATE 75 MG PO TABS: 75.0000 mg | ORAL_TABLET | Freq: Every day | ORAL | Status: DC

## 2012-10-07 MED ORDER — ISOSORBIDE MONONITRATE ER 30 MG PO TB24: 30.0000 mg | ORAL_TABLET | Freq: Every day | ORAL | Status: DC

## 2012-10-07 NOTE — Progress Notes (Signed)
Pt d/c ot home with wife. D/c instructions and medications reviewed with Pt and wife. Pt and wife state understanding. All Pt and wife questions answered

## 2012-10-07 NOTE — Discharge Summary (Signed)
Physician Discharge Summary  Patient ID: GAYNOR FERRERAS MRN: [id number] DOB/AGE: [birth year] 66 y.o.  Admit date: 10/04/2012 Discharge date: 10/07/2012  Admission Diagnoses: ACS  Discharge Diagnoses:  Principal Problem:  *Acute coronary syndrome  Active Problems:  CAD, CABG X 09 Nov 2011. re-do CABG X1 09/19/12  Smoker  HTN (hypertension)  ICM, EF 40% Jan 2013, now 50-55% Oct 2013  Dyslipidemia  Acute diastolic HF (heart failure) - likely cause of ACS; resolved  Anemia   Discharged Condition: stable  Hospital Course: This is a 66 y.o. male with a past medical history significant for extensive problems with coronary artery disease starting in January of this year, when he presented with an acute anterolateral myocardial infarction. Cardiac catheterization showed a 99% ostial stenosis of the left main coronary artery with a mid LAD stenosis and moderate right coronary artery stenosis. He required treatment with an aortic balloon pump and then underwent emergency three-vessel bypass surgery (LIMA to LAD, SVG to OM, SVG to RCA by Dr. [name]). Postoperative left ventricular ejection fraction was estimated to be about 50-55%.   He returned on October 24 with a non-ST segment elevation myocardial infarction. Repeat coronary cath  showed a  total occlusion of the left main coronary artery as well as total occlusion of the saphenous vein graft to the oblique marginal artery. There was a proximal M.D. artery stenosis that prevented adequate flow from the LIMA bypass to the LAD in a retrograde fashion to the left circumflex coronary artery. The bypass graft to the right coronary artery was patent. The patient continued to be very symptomatic on medical therapy and he underwent single vessel redo bypass surgery to the oblique marginal artery on October 14. He was discharged from the hospital on October 17. He stated that he had not really felt well following the second procedure. He continued to  have what he described as a "deep" retrosternal pain that was quite different from his incisional pain. He felt particularly bad the night he came to the ED. Roughly 2 hours prior to arrival into the emergency room, he began experiencing progressively worsening chest pressure, nausea and dyspnea. No vomiting. He had taken sublingual NTG at home that provided minimal relief. He had more relief when he received a second nitroglycerin from EMS, but substantial reduction in his pain only occurred after he received intravenous morphine. On arrival to the ER, chest pain severity was roughly 6-7/10. His chest discomfort persisted ~ 2/10 despite intravenous nitroglycerin. Intravenous heparin and Integrilin was also initiated.   The electrocardiogram performed by EMS showed substantial downsloping ST segment depression in leads 12 and aVL V5 and V6 with T wave inversion in all those leads as well as V3 through V5.  Small Q waves were seen in leads V1 and V2 there was ST segment elevation in lead aVL. A followup electrocardiogram performed roughly 30 minutes later showed substantial improvement in the repolarization abnormalities with ST's back to the patient's previous baseline and upright T waves in all of the precordial leads. The initial point of care troponin I was negative.  Subsequent troponins were elevated at 0.35 and 0.48. He then underwent urgent cardiac catheterization, performed by Dr. Herbie Baltimore.  The cath showed no angiographic evidence of new obstructive CAD, but did demonstrate mildly reduced EF with mild-moderately elevated LVEDP. In addition to IV NTG, Heparin and Integrelin, he was started on IV Lasix which resulted in good UOP. He was then switched to PO Lasix the following day. He diuresed  relatively well on PO Lasix. He denied further CP/SOB. He was weaned off NTG gtt and started on PO Imdur. Plavix was also added for additional prophylactic coverage. In addition, his Hgb dropped from 11.2, at time of  admission (10/04/12),  to 9.3 and further to 8.8, over a two day period. Stool guaiac was negative and a re-cheek CBC on 10/07/12 showed Hgb trending upward to 9.5.  He reported no further angina or SOB.   The pt was last seen and examined by Dr. Herbie Baltimore. He feels that he is stable and can be d/c home.   Consults: Cardiology. No other services consulted  Significant Diagnostic Studies:   Cardiac Panel (last 3 results)  Basename 10/05/12 0503 10/05/12 0138 10/04/12 1640  CKTOTAL -- -- --  CKMB -- -- --  TROPONINI <0.30 <0.30 0.48*  RELINDX -- -- --     CARDIAC CATHETERIZATION REPORT  Hemodynamics:  Central Aortic / Mean Pressures: 129/72 mmHg; 97 mmHg  Left Ventricular Pressures / EDP: 129/6 mmHg; 25 mmHg Left Ventriculography:  EF: ~45-50 %  Wall Motion: Mild Anterolateral hypokinesis. Coronary Anatomy:  Left Main: 100% Aorto-Ostial occlusion (previoulsy described) LAD: Fills via patent LIMA to mid-distal LAD; Antegrade flow reveals a "bifurcation into 2 equal sized vessels (LAD & D2); Retrograde flow reveals a large SP trunk distal to a very proximal D1, flow is noted to reach the ostially occluded Left Main with to-fro flow noted in the proximal Circumflex from the patent (new) SVG-OM1.  D1: Small to moderate caliber vesse; diffuse mild luminal irregularities.  D2: Moderate caliber vessel, covers distal anterolateral wall; mild luminal irregularities. Left Circumflex: Fills via SVG-OM1; Retrograde filling of native Circumflex reveals the graft attached to the largest of 3 branches from a Lateral OM2 then back fills up to the proximal circumflex/occluded Left Main as well as down the AV Groove Circumflex that bifurcates into 2 small LPL branches. The native Circumflex/OM system is otherwise relatively free of angiographically significant disease.  OM1: Large "trifurcating" lateral OM filled via the newly placed SVG-OM1; antegrade flow down the the largest "superior" branch reveals  minimal luminal irregularities. RCA: Leonides Schanz is a smaller moderate caliber vessel with diffuse luminal irregularities throughout. It terminates as what appears to be the Posterior Descending Artery with a small Posterior Lateral Branch; there was damping of the pressures with the insertion of the catheter which reveals likely police 40% ostial stenosis. There is no true evidence of to and fro flow from the vein graft.  Review of old films reveals that the RCA had a very high bifurcation into the posterior descending artery and Posterior Lateral Branch as a large RV Marginal Branch  RPDA: small large-caliber vessel which tapers to the apex, diffuse luminal irregularities.  RPL Sysytem:The Right Posterior AV Groove Branch fills via the SVG-RPL  Graft Anatomy:  LIMA-LAD: Widely patent, tortuous vessel.  SVG-distal RCA: the graft is patent although not clear what it is providing flow to. There is some retrograde flow up what appears to the native Right Coronary Artery.  SVG-OM1: Widely patent graft to extensive circumflex distribution. On some initial imaging there appeared to be a possible filling defect in the [place] proximal/ostial portion of this graft however with several manipulations it appeared to be free of any significant lesions. 5 Fr RFA Sheath to be removed in PACU Holding Area with direct manual pressure for hemostasis.  EBL: < 10 ml  PATIENT DISPOSITION:  The patient was transferred to the PACU holding area in a hemodynamicaly  stable, chest pain free condition.  The patient tolerated the procedure well, and there were no complications.  The patient was stable before, during, and after the procedure. POST-OPERATIVE DIAGNOSIS:  No angiographic evidence of new Obstructive CAD -- patent new SVG-OM1 with brisk TIMI 3 flow filling antegrade and retrograde perfusing the entire LCx territory.  Known Aorto-Ostial LM 100% occlusion -- unable to engage; with patent LIMA-LAD.  Mildly reduced EF with  mild-moderately elevated LVEDP PLAN OF CARE:  Given the dynamic ECG changes & symptoms will return to TCU for monitoring --  Continue Integrelin gtt x 18 hrs & restart Heparin 8 hrs post sheath pull, as I cannot exclude acute thrombus cleared with Integrilin & ASA as his Sx & ECG changes all but resolved with this Rx.  Continue to cycle cardiac biomarkers  IV NTG gtt overnight.  Will initiate DAPT with ASA & Plavix for prophylaxis  IV Lasix today & start PO Lasix in AM for elevated EDP that is likely a result of acute diastolic HF, potentially related to ACS.  TCU tonight, if stable tomorrow, will transfer to Tele with likely d/c Sunday. Marykay Lex, M.D., M.S.  [facility]  [address]  [place], [place] [address]  [phone]  10/04/2012  10:36 AM      Hemoglobin & Hematocrit     Component Value Date/Time   HGB 9.5* 10/07/2012 0500   HCT 30.1* 10/07/2012 0500   Treatments: see hospital course above  Discharge Exam: Blood pressure 106/66, pulse 62, temperature 98.1 F (36.7 C), temperature source Oral, resp. rate 18, height 5\' 10"  (1.778 m), weight 80.604 kg (177 lb 11.2 oz), SpO2 100.00%.   Disposition: 01-Home or Self Care  Discharge Orders    Future Appointments: Provider: Department: Dept Phone: Center:   10/15/2012 2:00 PM Alleen Borne, MD [facility] [phone] [facility]     Future Orders Please Complete By Expires   Amb Referral to Cardiac Rehabilitation      Diet - low sodium heart healthy      Increase activity slowly      Discharge instructions      Comments:   If weight gain of 3lbs in 2 days, take additional 20 mg of Lasix in the morning for 3 days.       Medication List     As of 10/07/2012 12:03 PM    TAKE these medications         aspirin 325 MG EC tablet   Take 1 tablet (325 mg total) by mouth daily.      clopidogrel 75 MG tablet   Commonly known as: PLAVIX    Take 1 tablet (75 mg total) by mouth daily with breakfast.      finasteride 5 MG tablet   Commonly known as: PROSCAR   Take 5 mg by mouth daily.      furosemide 20 MG tablet   Commonly known as: LASIX   Take 1 tablet (20 mg total) by mouth 2 (two) times daily.      isosorbide mononitrate 30 MG 24 hr tablet   Commonly known as: IMDUR   Take 1 tablet (30 mg total) by mouth daily.      metoprolol 50 MG tablet   Commonly known as: LOPRESSOR   Take 25 mg by mouth 2 (two) times daily.      nitroGLYCERIN 0.4 MG SL tablet   Commonly known as: NITROSTAT   Place  1 tablet (0.4 mg total) under the tongue every 5 (five) minutes x 3 doses as needed for chest pain.      omeprazole 20 MG capsule   Commonly known as: PRILOSEC   Take 20 mg by mouth 2 (two) times daily.      rosuvastatin 10 MG tablet   Commonly known as: CRESTOR   Take 2 tablets (20 mg total) by mouth daily at 6 PM.      terazosin 5 MG capsule   Commonly known as: HYTRIN   Take 5 mg by mouth at bedtime.           Follow-up Information    Follow up with HAGER, BRYAN, PA. On 10/14/2012. (follow up cardiology appointment is scheduled for Monday Dec. 9 at 1:40 pm)    Contact information:   [address] [place] [address] [phone]          Signed: Wilburt Finlay 10/07/2012, 12:03 PM  I saw and evaluated the patient on the morning of discharge along with Wilburt Finlay, PA. I agree with his Summary as noted above.  Mr. Tietze looks & feels well this AM.  Hgb improved (? Lab error yesterday).  BP & HR stable -- no events on TELE o/n. On BB, but not enough BP room for ACE-I at present. Will keep on long acting Nitrate.  I think he is ready for d/c -- would keep on standing dose of 20mg  Lasix --> write as BID, but will be taking daily unless additional PRN doses needed with Sliding Scale dosing for Wgt gain > 3lb (take bid until back to dry wgt).  Will need close clinic F//u.  Marykay Lex,  M.D., M.S. [facility] [address] [place], [place]  [address]  [phone] Pager # [phone] 10/09/2012 3:31 PM

## 2012-10-07 NOTE — Progress Notes (Signed)
[facility]  Subjective: No complaints. Denies SOB.  Objective: Vital signs in last 24 hours: Temp:  [97.6 F (36.4 C)-98.7 F (37.1 C)] 97.9 F (36.6 C) (12/02 0551) Pulse Rate:  [60-69] 60  (12/02 0551) Resp:  [17-18] 18  (12/02 0551) BP: (94-116)/(57-67) 94/67 mmHg (12/02 0551) SpO2:  [99 %-100 %] 100 % (12/02 0551) Weight:  [80.604 kg (177 lb 11.2 oz)] 80.604 kg (177 lb 11.2 oz) (12/02 0551) Last BM Date: 10/06/12  Intake/Output from previous day: 12/01 0701 - 12/02 0700 In: 840 [P.O.:840] Out: 1625 [Urine:1625] Intake/Output this shift:    Medications Current Facility-Administered Medications  Medication Dose Route Frequency Provider Last Rate Last Dose  . 0.9 %  sodium chloride infusion   Intravenous Continuous Marykay Lex, MD 10 mL/hr at 10/04/12 1500 10 mL/hr at 10/04/12 1500  . acetaminophen (TYLENOL) tablet 650 mg  650 mg Oral Q4H PRN Marykay Lex, MD      . aspirin EC tablet 325 mg  325 mg Oral Daily Marykay Lex, MD   325 mg at 10/06/12 1115  . atorvastatin (LIPITOR) tablet 20 mg  20 mg Oral q1800 Mihai Croitoru, MD   20 mg at 10/06/12 1725  . clopidogrel (PLAVIX) tablet 75 mg  75 mg Oral Q breakfast Marykay Lex, MD   75 mg at 10/07/12 0753  . finasteride (PROSCAR) tablet 5 mg  5 mg Oral Daily Mihai Croitoru, MD   5 mg at 10/06/12 1115  . furosemide (LASIX) tablet 20 mg  20 mg Oral BID Marykay Lex, MD   20 mg at 10/07/12 0753  . isosorbide mononitrate (IMDUR) 24 hr tablet 30 mg  30 mg Oral Daily Marykay Lex, MD   30 mg at 10/06/12 1115  . metoprolol tartrate (LOPRESSOR) tablet 25 mg  25 mg Oral BID Thurmon Fair, MD   25 mg at 10/06/12 2250  . nitroGLYCERIN (NITROSTAT) SL tablet 0.4 mg  0.4 mg Sublingual Q5 Min x 3 PRN Mihai Croitoru, MD      . ondansetron (ZOFRAN) injection 4 mg  4 mg Intravenous Q6H PRN Mihai Croitoru, MD      . pantoprazole (PROTONIX) EC tablet 40 mg  40 mg Oral Daily Mihai Croitoru, MD   40  mg at 10/06/12 1114  . [COMPLETED] potassium chloride SA (K-DUR,KLOR-CON) CR tablet 40 mEq  40 mEq Oral Once Abelino Derrick, [place]   40 mEq at 10/06/12 1114  . terazosin (HYTRIN) capsule 5 mg  5 mg Oral QHS Eda Paschal [place], [place]   5 mg at 10/05/12 2147    PE: General appearance: alert, cooperative and no distress Lungs: clear to auscultation bilaterally Heart: regular rate and rhythm and 1/6 Systolic murmur at LSB Extremities: No LEE Pulses: 2+ and symmetric Skin: warm and dry Neurologic: Grossly normal  Lab Results:   Basename 10/07/12 0500 10/06/12 0549 10/05/12 0113  WBC 7.5 6.8 7.4  HGB 9.5* 8.8* 9.3*  HCT 30.1* 27.4* 28.5*  PLT 309 306 330   BMET  Basename 10/07/12 0500 10/06/12 0549 10/05/12 0503  NA 139 139 142  K 3.5 3.5 3.4*  CL 106 107 109  CO2 22 24 24   GLUCOSE 91 93 89  BUN 13 13 17   CREATININE 0.71 0.76 0.81  CALCIUM 8.7 8.5 8.3*    Assessment/Plan Principal Problem:  *Acute coronary syndrome  Active Problems:  CAD, CABG X 09 Nov 2011. re-do CABG X1 09/19/12  Smoker  HTN (hypertension)  ICM, EF 40% Jan 2013, now 50-55% Oct 2013  Dyslipidemia  Acute diastolic HF (heart failure) - likely cause of ACS; resolved  Anemia  Plan: Net fluids -1.8 L. NSR on telemetry. HR and BP stable. Hgb is up at 9.5 today (8.8 yesterday). Stool guaiac negative. Consider PRN home Lasix. D/C home today.   LOS: 3 days    Wilna Pennie 10/07/2012 9:19 AM

## 2012-10-07 NOTE — Progress Notes (Addendum)
I have seen and evaluated the patient this morning along with Wilburt Finlay, PA. I agree with his findings, examination as well as impression recommendations.  Mr. Pavone looks & feels well this AM.   Hgb improved (? Lab error yesterday).    BP & HR stable -- no events on TELE o/n.  On BB, but not enough BP room for ACE-I at present.  Will keep on long acting Nitrate. I think he is ready for d/c -- would keep on standing dose of 20mg  Lasix --> write as BID, but will be taking daily unless additional PRN doses needed with Sliding Scale dosing for Wgt gain > 3lb (take bid until back to dry wgt).  Will need close clinic F//u.  Marykay Lex, M.D., M.S. [facility] [address] [place], [place]  [address]  [phone] Pager # [phone] 10/07/2012 10:23 AM

## 2012-10-07 NOTE — Progress Notes (Signed)
1040-1100 Cardiac Rehab Noted that pt is for discharge. Reviewed exercise guidelines with pt and wife. He has referral for Outpt. CRP in [place] and he is ready to get stated. I will send update card to them.

## 2012-10-11 ENCOUNTER — Other Ambulatory Visit: Payer: Self-pay | Admitting: *Deleted

## 2012-10-11 DIAGNOSIS — Z951 Presence of aortocoronary bypass graft: Secondary | ICD-10-CM

## 2012-10-14 ENCOUNTER — Ambulatory Visit
Admission: RE | Admit: 2012-10-14 | Discharge: 2012-10-14 | Disposition: A | Discharge status: Home / Self Care | Payer: Medicare Other | Source: Ambulatory Visit | Attending: Surgery | Admitting: Surgery

## 2012-10-14 ENCOUNTER — Ambulatory Visit (INDEPENDENT_AMBULATORY_CARE_PROVIDER_SITE_OTHER): Payer: Self-pay | Admitting: Physician Assistant

## 2012-10-14 ENCOUNTER — Ambulatory Visit: Payer: Medicare Other

## 2012-10-14 VITALS — BP 129/74 | HR 65 | Resp 18 | Ht 70.0 in | Wt 178.0 lb

## 2012-10-14 DIAGNOSIS — Z951 Presence of aortocoronary bypass graft: Secondary | ICD-10-CM | POA: Diagnosis not present

## 2012-10-14 DIAGNOSIS — I2510 Atherosclerotic heart disease of native coronary artery without angina pectoris: Secondary | ICD-10-CM

## 2012-10-14 DIAGNOSIS — Z79899 Other long term (current) drug therapy: Secondary | ICD-10-CM | POA: Diagnosis not present

## 2012-10-14 MED ORDER — HYDROCODONE-ACETAMINOPHEN 10-325 MG PO TABS: 1.0000 | ORAL_TABLET | Freq: Four times a day (QID) | ORAL | Status: DC | PRN

## 2012-10-14 NOTE — Progress Notes (Signed)
  HPI: Patient returns for routine postoperative follow-up having undergone Redo CABG x 1 on 09/19/2012. The patient's early postoperative recovery while in the hospital was unremarkable. Since hospital discharge patient was readmitted to the hospital with ACS.  He presented to the Emergency Department where workup was concerning for possible MI.  He underwent catheterization which showed patent SVG.  It was felt that acute HF was most likely the cause of ACS.  He was subsequently treated with Lasix and discharged home.  Since then the patient states that he is doing better.  He states he has no experienced anymore angina, but continues to have incisional pain.  He states he continues to be sore and is not able to lay down at night from the soreness across his chest.  He denies shortness of breath and LE edema.  He is ambulating without difficulty.  Finally he requests a refill on his pain medication.     Current Outpatient Prescriptions  Medication Sig Dispense Refill  . aspirin EC 325 MG EC tablet Take 1 tablet (325 mg total) by mouth daily.  30 tablet    . clopidogrel (PLAVIX) 75 MG tablet Take 1 tablet (75 mg total) by mouth daily with breakfast.  30 tablet  11  . finasteride (PROSCAR) 5 MG tablet Take 5 mg by mouth daily.      . furosemide (LASIX) 20 MG tablet Take 1 tablet (20 mg total) by mouth 2 (two) times daily.  60 tablet  5  . isosorbide mononitrate (IMDUR) 30 MG 24 hr tablet Take 1 tablet (30 mg total) by mouth daily.  30 tablet  5  . metoprolol (LOPRESSOR) 50 MG tablet Take 25 mg by mouth 2 (two) times daily.      . nitroGLYCERIN (NITROSTAT) 0.4 MG SL tablet Place 1 tablet (0.4 mg total) under the tongue every 5 (five) minutes x 3 doses as needed for chest pain.  25 tablet  5  . omeprazole (PRILOSEC) 20 MG capsule Take 20 mg by mouth 2 (two) times daily.      . rosuvastatin (CRESTOR) 10 MG tablet Take 2 tablets (20 mg total) by mouth daily at 6 PM.  30 tablet  5  . terazosin (HYTRIN) 5  MG capsule Take 5 mg by mouth at bedtime.        Physical Exam:  BP 129/74  Pulse 65  Resp 18  Ht 5\' 10"  (1.778 m)  Wt 178 lb (80.74 kg)  BMI 25.54 kg/m2  SpO2 99%  Gen; no apparent distress Heart: RRR Lungs: CTA bilaterally Skin: incisions clean and dry Ext: no LE edema  Diagnostic Tests:  CXR: no evidence of pneumothorax, or significant pleural effusions  Impression:  [name] is S/P Redo CABG x1.  He did have admission for ACS felt most likely to be related to acute HF exacerbation.  Since then the patient has been doing well other than some post operative incisional pain.  He was encouraged to continue increasing his activity level as tolerated.    Plan:  We will bring [name] back to clinic in one month.  He will be given a refill on his Norco 10/325mg  tablets to take one by mouth daily as needed for pain.  He was encouraged to only use this medication for severe pain.  Finally he was instructed to contact our office should his pain worsen or other problems develop.

## 2012-10-15 ENCOUNTER — Ambulatory Visit: Payer: Medicare Other | Admitting: Surgery

## 2012-10-16 ENCOUNTER — Emergency Department (HOSPITAL_COMMUNITY): Payer: Non-veteran care

## 2012-10-16 ENCOUNTER — Inpatient Hospital Stay (HOSPITAL_COMMUNITY)
Admission: EM | Admit: 2012-10-16 | Discharge: 2012-10-19 | DRG: 282 | Disposition: A | Discharge status: Home / Self Care | Payer: Non-veteran care | Attending: Internal Medicine | Admitting: Internal Medicine

## 2012-10-16 ENCOUNTER — Encounter (HOSPITAL_COMMUNITY): Payer: Self-pay | Admitting: Family Medicine

## 2012-10-16 DIAGNOSIS — I2510 Atherosclerotic heart disease of native coronary artery without angina pectoris: Secondary | ICD-10-CM | POA: Diagnosis present

## 2012-10-16 DIAGNOSIS — E876 Hypokalemia: Secondary | ICD-10-CM | POA: Diagnosis not present

## 2012-10-16 DIAGNOSIS — I255 Ischemic cardiomyopathy: Secondary | ICD-10-CM | POA: Diagnosis present

## 2012-10-16 DIAGNOSIS — Z951 Presence of aortocoronary bypass graft: Secondary | ICD-10-CM

## 2012-10-16 DIAGNOSIS — I249 Acute ischemic heart disease, unspecified: Secondary | ICD-10-CM

## 2012-10-16 DIAGNOSIS — N400 Enlarged prostate without lower urinary tract symptoms: Secondary | ICD-10-CM | POA: Diagnosis present

## 2012-10-16 DIAGNOSIS — K5900 Constipation, unspecified: Secondary | ICD-10-CM | POA: Diagnosis not present

## 2012-10-16 DIAGNOSIS — I214 Non-ST elevation (NSTEMI) myocardial infarction: Secondary | ICD-10-CM | POA: Diagnosis present

## 2012-10-16 DIAGNOSIS — Z87891 Personal history of nicotine dependence: Secondary | ICD-10-CM | POA: Diagnosis present

## 2012-10-16 DIAGNOSIS — F4310 Post-traumatic stress disorder, unspecified: Secondary | ICD-10-CM | POA: Diagnosis present

## 2012-10-16 DIAGNOSIS — E785 Hyperlipidemia, unspecified: Secondary | ICD-10-CM | POA: Diagnosis present

## 2012-10-16 DIAGNOSIS — Z8613 Personal history of malaria: Secondary | ICD-10-CM

## 2012-10-16 DIAGNOSIS — I200 Unstable angina: Secondary | ICD-10-CM | POA: Diagnosis not present

## 2012-10-16 DIAGNOSIS — I2589 Other forms of chronic ischemic heart disease: Secondary | ICD-10-CM | POA: Diagnosis present

## 2012-10-16 DIAGNOSIS — E7800 Pure hypercholesterolemia, unspecified: Secondary | ICD-10-CM | POA: Diagnosis present

## 2012-10-16 DIAGNOSIS — I10 Essential (primary) hypertension: Secondary | ICD-10-CM | POA: Diagnosis present

## 2012-10-16 HISTORY — DX: Atherosclerotic heart disease of native coronary artery without angina pectoris: I25.10

## 2012-10-16 LAB — CBC WITH DIFFERENTIAL/PLATELET
Basophils Absolute: 0 10*3/uL (ref 0.0–0.1)
Basophils Relative: 0 % (ref 0–1)
Eosinophils Absolute: 0 10*3/uL (ref 0.0–0.7)
HCT: 32.2 % — ABNORMAL LOW (ref 39.0–52.0)
Hemoglobin: 10.6 g/dL — ABNORMAL LOW (ref 13.0–17.0)
MCH: 27.7 pg (ref 26.0–34.0)
MCHC: 32.9 g/dL (ref 30.0–36.0)
Monocytes Absolute: 0.6 10*3/uL (ref 0.1–1.0)
Monocytes Relative: 8 % (ref 3–12)
RDW: 15.2 % (ref 11.5–15.5)

## 2012-10-16 LAB — TROPONIN I
Troponin I: 0.71 ng/mL (ref <0.30)
Troponin I: 1.08 ng/mL (ref <0.30)

## 2012-10-16 LAB — COMPREHENSIVE METABOLIC PANEL
Albumin: 3.4 g/dL — ABNORMAL LOW (ref 3.5–5.2)
BUN: 27 mg/dL — ABNORMAL HIGH (ref 6–23)
Calcium: 9.4 mg/dL (ref 8.4–10.5)
Creatinine, Ser: 0.97 mg/dL (ref 0.50–1.35)
Total Protein: 7.4 g/dL (ref 6.0–8.3)

## 2012-10-16 LAB — PROTIME-INR
INR: 1.09 (ref 0.00–1.49)
Prothrombin Time: 14 seconds (ref 11.6–15.2)

## 2012-10-16 LAB — GLUCOSE, CAPILLARY: Glucose-Capillary: 112 mg/dL — ABNORMAL HIGH (ref 70–99)

## 2012-10-16 LAB — HEPARIN LEVEL (UNFRACTIONATED): Heparin Unfractionated: 0.38 IU/mL (ref 0.30–0.70)

## 2012-10-16 LAB — D-DIMER, QUANTITATIVE: D-Dimer, Quant: 1.01 ug/mL-FEU — ABNORMAL HIGH (ref 0.00–0.48)

## 2012-10-16 MED ORDER — DIAZEPAM 5 MG PO TABS
5.0000 mg | ORAL_TABLET | ORAL | Status: AC
  Administered 2012-10-17: 5 mg via ORAL
  Filled 2012-10-16: qty 1

## 2012-10-16 MED ORDER — METOPROLOL TARTRATE 25 MG PO TABS
25.0000 mg | ORAL_TABLET | Freq: Two times a day (BID) | ORAL | Status: DC
  Administered 2012-10-16 – 2012-10-19 (×7): 25 mg via ORAL
  Filled 2012-10-16 (×8): qty 1

## 2012-10-16 MED ORDER — NITROGLYCERIN IN D5W 200-5 MCG/ML-% IV SOLN
2.0000 ug/min | Freq: Once | INTRAVENOUS | Status: AC
  Administered 2012-10-16: 5 ug/min via INTRAVENOUS

## 2012-10-16 MED ORDER — NITROGLYCERIN IN D5W 200-5 MCG/ML-% IV SOLN
INTRAVENOUS | Status: AC
  Administered 2012-10-16: 5 ug/min via INTRAVENOUS
  Filled 2012-10-16: qty 250

## 2012-10-16 MED ORDER — PANTOPRAZOLE SODIUM 40 MG PO TBEC
80.0000 mg | DELAYED_RELEASE_TABLET | Freq: Every day | ORAL | Status: DC
  Administered 2012-10-16 – 2012-10-19 (×4): 80 mg via ORAL
  Filled 2012-10-16: qty 1
  Filled 2012-10-16: qty 2
  Filled 2012-10-16 (×2): qty 1
  Filled 2012-10-16: qty 2
  Filled 2012-10-16: qty 1

## 2012-10-16 MED ORDER — HEPARIN BOLUS VIA INFUSION
4000.0000 [IU] | Freq: Once | INTRAVENOUS | Status: AC
  Administered 2012-10-16: 4000 [IU] via INTRAVENOUS

## 2012-10-16 MED ORDER — ALPRAZOLAM 0.25 MG PO TABS: 0.2500 mg | ORAL_TABLET | Freq: Two times a day (BID) | ORAL | Status: DC | PRN

## 2012-10-16 MED ORDER — ZOLPIDEM TARTRATE 5 MG PO TABS: 5.0000 mg | ORAL_TABLET | Freq: Every evening | ORAL | Status: DC | PRN

## 2012-10-16 MED ORDER — ASPIRIN 81 MG PO CHEW
324.0000 mg | CHEWABLE_TABLET | ORAL | Status: AC
  Administered 2012-10-17: 324 mg via ORAL
  Filled 2012-10-16: qty 4

## 2012-10-16 MED ORDER — SODIUM CHLORIDE 0.9 % IJ SOLN: 3.0000 mL | INTRAMUSCULAR | Status: DC | PRN

## 2012-10-16 MED ORDER — NITROGLYCERIN 0.4 MG SL SUBL: 0.4000 mg | SUBLINGUAL_TABLET | SUBLINGUAL | Status: DC | PRN

## 2012-10-16 MED ORDER — ACETAMINOPHEN 325 MG PO TABS: 650.0000 mg | ORAL_TABLET | ORAL | Status: DC | PRN

## 2012-10-16 MED ORDER — SODIUM CHLORIDE 0.9 % IV SOLN
INTRAVENOUS | Status: DC
  Administered 2012-10-16: 10 mL/h via INTRAVENOUS

## 2012-10-16 MED ORDER — HEPARIN (PORCINE) IN NACL 100-0.45 UNIT/ML-% IJ SOLN
1400.0000 [IU]/h | INTRAMUSCULAR | Status: DC
  Administered 2012-10-16 (×2): 1400 [IU]/h via INTRAVENOUS
  Filled 2012-10-16 (×5): qty 250

## 2012-10-16 MED ORDER — FINASTERIDE 5 MG PO TABS
5.0000 mg | ORAL_TABLET | Freq: Every day | ORAL | Status: DC
  Administered 2012-10-16 – 2012-10-19 (×4): 5 mg via ORAL
  Filled 2012-10-16 (×5): qty 1

## 2012-10-16 MED ORDER — INFLUENZA VIRUS VACC SPLIT PF IM SUSP
0.5000 mL | INTRAMUSCULAR | Status: DC
  Filled 2012-10-16: qty 0.5

## 2012-10-16 MED ORDER — CLOPIDOGREL BISULFATE 75 MG PO TABS
75.0000 mg | ORAL_TABLET | Freq: Every day | ORAL | Status: DC
  Administered 2012-10-17 – 2012-10-19 (×3): 75 mg via ORAL
  Filled 2012-10-16 (×4): qty 1

## 2012-10-16 MED ORDER — MORPHINE SULFATE 4 MG/ML IJ SOLN: 6.0000 mg | INTRAMUSCULAR | Status: DC | PRN

## 2012-10-16 MED ORDER — SODIUM CHLORIDE 0.9 % IV SOLN: 250.0000 mL | INTRAVENOUS | Status: DC | PRN

## 2012-10-16 MED ORDER — ASPIRIN EC 325 MG PO TBEC: 325.0000 mg | DELAYED_RELEASE_TABLET | Freq: Every day | ORAL | Status: DC

## 2012-10-16 MED ORDER — NITROGLYCERIN IN D5W 200-5 MCG/ML-% IV SOLN
20.0000 ug/min | INTRAVENOUS | Status: DC
  Administered 2012-10-16: 20 ug/min via INTRAVENOUS

## 2012-10-16 MED ORDER — ASPIRIN EC 81 MG PO TBEC
81.0000 mg | DELAYED_RELEASE_TABLET | Freq: Every day | ORAL | Status: DC
  Administered 2012-10-17 – 2012-10-19 (×3): 81 mg via ORAL
  Filled 2012-10-16 (×4): qty 1

## 2012-10-16 MED ORDER — SODIUM CHLORIDE 0.9 % IV SOLN: 1.0000 mL/kg/h | INTRAVENOUS | Status: DC

## 2012-10-16 MED ORDER — ONDANSETRON HCL 4 MG/2ML IJ SOLN
4.0000 mg | Freq: Once | INTRAMUSCULAR | Status: AC
  Administered 2012-10-16: 4 mg via INTRAVENOUS
  Filled 2012-10-16: qty 2

## 2012-10-16 MED ORDER — ATORVASTATIN CALCIUM 10 MG PO TABS
10.0000 mg | ORAL_TABLET | Freq: Every day | ORAL | Status: DC
  Administered 2012-10-16 – 2012-10-19 (×4): 10 mg via ORAL
  Filled 2012-10-16 (×5): qty 1

## 2012-10-16 MED ORDER — PNEUMOCOCCAL VAC POLYVALENT 25 MCG/0.5ML IJ INJ
0.5000 mL | INJECTION | INTRAMUSCULAR | Status: DC
  Filled 2012-10-16: qty 0.5

## 2012-10-16 MED ORDER — TERAZOSIN HCL 5 MG PO CAPS
5.0000 mg | ORAL_CAPSULE | Freq: Every day | ORAL | Status: DC
  Administered 2012-10-16 – 2012-10-18 (×3): 5 mg via ORAL
  Filled 2012-10-16 (×5): qty 1

## 2012-10-16 MED ORDER — SODIUM CHLORIDE 0.9 % IJ SOLN
3.0000 mL | Freq: Two times a day (BID) | INTRAMUSCULAR | Status: DC
  Administered 2012-10-16: 3 mL via INTRAVENOUS

## 2012-10-16 MED ORDER — EPTIFIBATIDE 75 MG/100ML IV SOLN: 2.0000 ug/kg/min | INTRAVENOUS | Status: DC

## 2012-10-16 MED ORDER — FUROSEMIDE 20 MG PO TABS
20.0000 mg | ORAL_TABLET | Freq: Two times a day (BID) | ORAL | Status: DC
  Administered 2012-10-16 – 2012-10-19 (×7): 20 mg via ORAL
  Filled 2012-10-16 (×10): qty 1

## 2012-10-16 MED ORDER — ONDANSETRON HCL 4 MG/2ML IJ SOLN: 4.0000 mg | Freq: Four times a day (QID) | INTRAMUSCULAR | Status: DC | PRN

## 2012-10-16 MED ORDER — MORPHINE SULFATE 4 MG/ML IJ SOLN
4.0000 mg | Freq: Once | INTRAMUSCULAR | Status: AC
  Administered 2012-10-16: 4 mg via INTRAVENOUS
  Filled 2012-10-16: qty 1

## 2012-10-16 MED ORDER — HYDROCODONE-ACETAMINOPHEN 10-325 MG PO TABS: 1.0000 | ORAL_TABLET | Freq: Four times a day (QID) | ORAL | Status: DC | PRN

## 2012-10-16 MED ORDER — MORPHINE SULFATE 2 MG/ML IJ SOLN: 2.0000 mg | INTRAMUSCULAR | Status: DC | PRN

## 2012-10-16 NOTE — ED Notes (Signed)
No pedal edema noted. Pt denies shob

## 2012-10-16 NOTE — ED Notes (Signed)
Pt sleeping. 

## 2012-10-16 NOTE — ED Notes (Signed)
Pt has had chest pain x 4 hours mid upper chest going in to jaw. Pt had Aspirin and 3 Ntg sl pta

## 2012-10-16 NOTE — ED Notes (Signed)
Troponin elevated.  PA paged

## 2012-10-16 NOTE — ED Notes (Signed)
Cardiology at bedside.

## 2012-10-16 NOTE — ED Notes (Signed)
Port CXR completed

## 2012-10-16 NOTE — ED Notes (Signed)
EKG repeated and shown to EDP.  

## 2012-10-16 NOTE — ED Notes (Signed)
Family to bedside.  EKG shown to Dr. Patria Mane

## 2012-10-16 NOTE — Progress Notes (Signed)
ANTICOAGULATION CONSULT NOTE - Initial Consult  Pharmacy Consult for heparin Indication: chest pain/ACS  No Known Allergies  Patient Measurements: Height: 5' 10.08" (178 cm) Weight: 177 lb 14.6 oz (80.7 kg) (Per 10/14/12 documentation) IBW/kg (Calculated) : 73.18  Heparin Dosing Weight: 81 kg   Vital Signs: Temp: 98.7 F (37.1 C) (12/11 0444) BP: 143/74 mmHg (12/11 0522) Pulse Rate: 85  (12/11 0522)  Labs:  [facility] 10/16/12 0502  HGB 10.6*  HCT 32.2*  PLT 258  APTT --  LABPROT --  INR --  HEPARINUNFRC --  CREATININE --  CKTOTAL --  CKMB --  TROPONINI --    Estimated Creatinine Clearance: 95.3 ml/min (by C-G formula based on Cr of 0.71).   Medical History: Past Medical History  Diagnosis Date  . Hypertension   . Hypercholesterolemia   . Anginal pain   . DDD (degenerative disc disease), cervical   . Arthritis     "in my back and neck" (2012-09-19)  . Malaria     "I've had it a couple times in the 1960's; almost died from it" (Sep 19, 2012)  . PTSD (post-traumatic stress disorder)   . NSTEMI (non-ST elevated myocardial infarction) 11/2011    anterior  . NSTEMI (non-ST elevated myocardial infarction) 09/19/12  . Coronary artery disease     Medications:  Scheduled:    . [COMPLETED]  morphine injection  4 mg Intravenous Once  . [COMPLETED] nitroGLYCERIN  2-200 mcg/min Intravenous Once  . [COMPLETED] ondansetron (ZOFRAN) IV  4 mg Intravenous Once    Assessment: 65 yo male presented with chest pain. Pharmacy to manage heparin. During previous hospitalization (11/29 - 12/1), patient with heparin level of 0.44 with infusion rate of 1500 units/hr (18.5 units/kg/hr).   Goal of Therapy:  Heparin level 0.3-0.7 units/ml Monitor platelets by anticoagulation protocol: Yes   Plan:  1. Heparin IV bolus of 4000 units x 1, then IV infusion at 1400 units/hr (17 units/kg/hr). 2. Heparin level in 6 hours. 3. Daily CBC, heparin level.   Emeline Gins 10/16/2012,5:28 AM

## 2012-10-16 NOTE — ED Notes (Signed)
Notified [name] PA of troponin result.  Pt to be NPO, order entered.   Called 2900 for report.  RN to call me back

## 2012-10-16 NOTE — H&P (Signed)
[name]. is an 65 y.o. male.    Chief Complaint: chest pain HPI:   [name] has a history coronary artery disease status post CABG and re-do this year. Rec'd a repeat heart catheterization approximately 2 weeks ago for abnormal EKG and at that time all of his grafts were patent. The patient reports he developed chest pressure and tightness with shortness of breath that began last pm around 2300, he would take NTG and pain would resolve for short period of time but then return.   He took pain medication as well as aspirin without relief, with continued returning of pain,  and thus he called EMS. EMS gave him nitroglycerin and he states mild improvement in his symptoms but still ongoing chest pain on arrival to the emergency department. EKG obtained by EMS demonstrates significant inferior lateral ST depression and T-wave inversions concerning for ischemia. The patient denies back pain. He reports ongoing discomfort in his jaw. He reports compliance with his medications. He denies nausea vomiting diarrhea. No fevers chills. No cough or congestion. No unilateral leg swelling. His pain was still moderate to severe, but with Heparin and IV NTG pain resolved.   EKG improved.  Currently pain free.  At home on Plavix, lasix, and imdur, in addition to BB.     Cardiac cath 10/04/12: Graft Anatomy:  LIMA-LAD: Widely patent, tortuous vessel.  SVG-distal RCA: the graft is patent although not clear what it is providing flow to. There is some retrograde flow up what appears to the native Right Coronary Artery.  SVG-OM1: Widely patent graft to extensive circumflex distribution. On some initial imaging there appeared to be a possible filling defect in the (?Very) [name] proximal/ostial portion of this graft however with several manipulations it appeared to be free of any significant lesions.  EF at cath 45-50%.    Past Medical History  Diagnosis Date  . Hypertension   . Hypercholesterolemia   . Anginal  pain   . DDD (degenerative disc disease), cervical   . Arthritis     "in my back and neck" (September 11, 2012)  . Malaria     "I've had it a couple times in the 1960's; almost died from it" (09-11-2012)  . PTSD (post-traumatic stress disorder)   . NSTEMI (non-ST elevated myocardial infarction) 11/2011    anterior  . NSTEMI (non-ST elevated myocardial infarction) 09-11-2012  . Coronary artery disease   . CAD, CABG X 09 Nov 2011. re-do CABG X1 09/19/12 12/04/2011    Past Surgical History  Procedure Date  . Back surgery   . Lumbar disc surgery 1980's?  . Tonsillectomy     "as a kid" (09/11/2012)  . Coronary artery bypass graft 12/02/2011    Procedure: CORONARY ARTERY BYPASS GRAFTING (CABG);  Surgeon: Alleen Borne, MD;  Location: [facility] OR;  Service: Open Heart Surgery;  Laterality: N/A;  . Cardiac catheterization 11/2011  . Cataract extraction w/ intraocular lens  implant, bilateral 1990's  . Coronary artery bypass graft 09/19/2012    Procedure: REDO CORONARY ARTERY BYPASS GRAFTING (CABG);  Surgeon: Alleen Borne, MD;  Location[facility] OR;  Service: Open Heart Surgery;  Laterality: N/A;    History reviewed. No pertinent family history. not significant to this admit. Social History:  reports that he quit smoking about 10 months ago. His smoking use included Cigarettes. He has a 25 pack-year smoking history. He has never used smokeless tobacco. He reports that he drinks alcohol. He reports that he does not use illicit  drugs. Married, wife is with him.  Allergies: No Known Allergies  Outpatient medications: aspirin 325 MG EC tablet     Take 1 tablet (325 mg total) by mouth daily.      clopidogrel 75 MG tablet     Commonly known as: PLAVIX     Take 1 tablet (75 mg total) by mouth daily with breakfast.     finasteride 5 MG tablet     Commonly known as: PROSCAR     Take 5 mg by mouth daily.     furosemide 20 MG tablet     Commonly known as: LASIX     Take 1 tablet (20 mg total) by mouth 2 (two)  times daily.     isosorbide mononitrate 30 MG 24 hr tablet     Commonly known as: IMDUR     Take 1 tablet (30 mg total) by mouth daily.     metoprolol 50 MG tablet     Commonly known as: LOPRESSOR     Take 25 mg by mouth 2 (two) times daily.     nitroGLYCERIN 0.4 MG SL tablet     Commonly known as: NITROSTAT     Place 1 tablet (0.4 mg total) under the tongue every 5 (five) minutes x 3 doses as needed for chest pain.     omeprazole 20 MG capsule     Commonly known as: PRILOSEC     Take 20 mg by mouth 2 (two) times daily.     Lipitor 10 mg daily             terazosin 5 MG capsule     Commonly known as: HYTRIN     Take 5 mg by mouth at bedtime.       Results for orders placed during the hospital encounter of 10/16/12 (from the past 48 hour(s))  CBC WITH DIFFERENTIAL     Status: Abnormal   Collection Time   10/16/12  5:02 AM      Component Value Range Comment   WBC 7.6  4.0 - 10.5 K/uL    RBC 3.83 (*) 4.22 - 5.81 MIL/uL    Hemoglobin 10.6 (*) 13.0 - 17.0 g/dL    HCT 16.1 (*) 09.6 - 52.0 %    MCV 84.1  78.0 - 100.0 fL    MCH 27.7  26.0 - 34.0 pg    MCHC 32.9  30.0 - 36.0 g/dL    RDW 04.5  40.9 - 81.1 %    Platelets 258  150 - 400 K/uL    Neutrophils Relative 76  43 - 77 %    Neutro Abs 5.8  1.7 - 7.7 K/uL    Lymphocytes Relative 15  12 - 46 %    Lymphs Abs 1.2  0.7 - 4.0 K/uL    Monocytes Relative 8  3 - 12 %    Monocytes Absolute 0.6  0.1 - 1.0 K/uL    Eosinophils Relative 0  0 - 5 %    Eosinophils Absolute 0.0  0.0 - 0.7 K/uL    Basophils Relative 0  0 - 1 %    Basophils Absolute 0.0  0.0 - 0.1 K/uL   COMPREHENSIVE METABOLIC PANEL     Status: Abnormal   Collection Time   10/16/12  5:02 AM      Component Value Range Comment   Sodium 139  135 - 145 mEq/L    Potassium 3.5  3.5 - 5.1 mEq/L    Chloride  105  96 - 112 mEq/L    CO2 19  19 - 32 mEq/L    Glucose, Bld 135 (*) 70 - 99 mg/dL    BUN 27 (*) 6 - 23 mg/dL    Creatinine, Ser 1.61  0.50 - 1.35 mg/dL    Calcium 9.4   8.4 - 10.5 mg/dL    Total Protein 7.4  6.0 - 8.3 g/dL    Albumin 3.4 (*) 3.5 - 5.2 g/dL    AST 14  0 - 37 U/L    ALT 18  0 - 53 U/L    Alkaline Phosphatase 90  39 - 117 U/L    Total Bilirubin 0.3  0.3 - 1.2 mg/dL    GFR calc non Af Amer 85 (*) >90 mL/min    GFR calc Af Amer >90  >90 mL/min   PROTIME-INR     Status: Normal   Collection Time   10/16/12  5:02 AM      Component Value Range Comment   Prothrombin Time 14.0  11.6 - 15.2 seconds    INR 1.09  0.00 - 1.49   TROPONIN I     Status: Abnormal   Collection Time   10/16/12  7:47 AM      Component Value Range Comment   Troponin I 0.71 (*) <0.30 ng/mL   D-DIMER, QUANTITATIVE     Status: Abnormal   Collection Time   10/16/12  9:01 AM      Component Value Range Comment   D-Dimer, Quant 1.01 (*) 0.00 - 0.48 ug/mL-FEU   CK TOTAL AND CKMB     Status: Abnormal   Collection Time   10/16/12  9:01 AM      Component Value Range Comment   Total CK 76  7 - 232 U/L    CK, MB 5.1 (*) 0.3 - 4.0 ng/mL    Relative Index RELATIVE INDEX IS INVALID  0.0 - 2.5   TROPONIN I     Status: Abnormal   Collection Time   10/16/12  9:01 AM      Component Value Range Comment   Troponin I 1.08 (*) <0.30 ng/mL    Dg Chest 2 View  10/14/2012  *RADIOLOGY REPORT*  Clinical Data: CABG and November 2013, follow-up  CHEST - 2 VIEW  Comparison: None.  Findings: No active infiltrate or effusion is seen.  Cardiomegaly is stable.  Median sternotomy sutures are noted.  There are degenerative changes throughout the thoracic spine.  IMPRESSION: Stable cardiomegaly.  No active lung disease.   Original Report Authenticated By: Dwyane Dee, M.D.    Dg Chest Portable 1 View  10/16/2012  *RADIOLOGY REPORT*  Clinical Data: Chest pain and shortness of breath.  PORTABLE CHEST - 1 VIEW  Comparison: 10/14/2012  Findings: Stable appearance of postoperative changes in the mediastinum.  Shallow inspiration.  Borderline heart size with normal pulmonary vascularity.  Suggestion of mild  atelectasis in the right lung base.  No focal consolidation.  No blunting of costophrenic angles.  No pneumothorax.  Mediastinal contours appear intact.  IMPRESSION: Shallow inspiration with atelectasis in the right lung base.   Original Report Authenticated By: Burman Nieves, M.D.     ROS: General:no colds or fevers, no weight changes Skin:no rashes or ulcers HEENT:no blurred vision, no congestion CV:see HPI, saw Dr. Laneta Simmers yesterday PUL:see HPI GI:no diarrhea constipation or melena, no indigestion GU:no hematuria, no dysuria MS:no joint pain, no claudication Neuro:no syncope, no lightheadedness Endo:no diabetes, no thyroid disease  Blood pressure 114/67, pulse 74, temperature 98.7 F (37.1 C), resp. rate 15, height 5' 10.08" (1.78 m), weight 80.7 kg (177 lb 14.6 oz), SpO2 100.00%. PE: General:alert and oriented, pleasant affect, frustrated at return of pain Skin:warm and dry, brisk capillary refill HEENT:normocephalic, sclera clear Neck:supple, no JVD, no bruits Heart:S1S2 RRR, no obvious M,G,R,Click  Lungs:clear without rales, rhonchi or wheezes Abd:+ BS, soft, non tender  Ext:no edema, 2+ pedal pulses bil Neuro:alert, oriented, MAE, follows commands    Assessment/Plan Principal Problem:  *NSTEMI 10/16/12 Active Problems:  CAD, CABG X 09 Nov 2011. re-do CABG X1 09/19/12  Smoker  HTN (hypertension)  ICM, EF 40% Jan 2013, now 50-55% Oct 2013  Dyslipidemia  BPH (benign prostatic hyperplasia)  PLAN: admit, inpt. Continue IV heparin and NTG.  Serial cardiac enzymes, will add ddimer.  Admit to step down.  Pls note troponin  Now 1.08.  Pt. NPO.  ddimer is elevated as well, 1.01.  [name],LAURA R 10/16/2012, 10:07 AM   Agree with note written by Nada Boozer RNP  Pt well known to me with recent 1 vessel re bypass (SVG to LCX-OM). Recathed 2 weeks ago with widely patent grafts. Now admitted with recurrent CP c/w [place] and dynamic EKG changes. Currently pain free on IV hep/NTG  with improved EKG. Exam benign. Will need recath tomorrow. No integrelin secondary to proximity to recent CABG.   Runell Gess 10/16/2012 6:53 PM

## 2012-10-16 NOTE — Progress Notes (Signed)
ANTICOAGULATION CONSULT NOTE - Follow UP  Pharmacy Consult for heparin Indication: chest pain/ACS  No Known Allergies  Patient Measurements: Height: 5\' 10"  (177.8 cm) Weight: 176 lb 5.9 oz (80 kg) IBW/kg (Calculated) : 73  Heparin Dosing Weight: 81 kg   Vital Signs: Temp: 98.6 F (37 C) (12/11 1200) Temp src: Oral (12/11 1200) BP: 107/84 mmHg (12/11 1200) Pulse Rate: 70  (12/11 1200)  Labs:  Basename 10/16/12 1137 10/16/12 0901 10/16/12 0747 10/16/12 0502  HGB -- -- -- 10.6*  HCT -- -- -- 32.2*  PLT -- -- -- 258  APTT -- -- -- --  LABPROT -- -- -- 14.0  INR -- -- -- 1.09  HEPARINUNFRC 0.38 -- -- --  CREATININE -- -- -- 0.97  CKTOTAL -- 76 -- --  CKMB -- 5.1* -- --  TROPONINI -- 1.08* 0.71* --    Estimated Creatinine Clearance: 78.4 ml/min (by C-G formula based on Cr of 0.97).   Medical History: Past Medical History  Diagnosis Date  . Hypertension   . Hypercholesterolemia   . Anginal pain   . DDD (degenerative disc disease), cervical   . Arthritis     "in my back and neck" (September 07, 2012)  . Malaria     "I've had it a couple times in the 1960's; almost died from it" (Sep 07, 2012)  . PTSD (post-traumatic stress disorder)   . NSTEMI (non-ST elevated myocardial infarction) 11/2011    anterior  . NSTEMI (non-ST elevated myocardial infarction) 09-07-12  . Coronary artery disease   . CAD, CABG X 09 Nov 2011. re-do CABG X1 09/19/12 12/04/2011    Medications:  Scheduled:     . aspirin EC  81 mg Oral Daily  . atorvastatin  10 mg Oral Daily  . clopidogrel  75 mg Oral Q breakfast  . finasteride  5 mg Oral Daily  . furosemide  20 mg Oral BID  . [COMPLETED] heparin  4,000 Units Intravenous Once  . influenza  inactive virus vaccine  0.5 mL Intramuscular Tomorrow-1000  . metoprolol  25 mg Oral BID  . [COMPLETED]  morphine injection  4 mg Intravenous Once  . [COMPLETED] nitroGLYCERIN  2-200 mcg/min Intravenous Once  . [COMPLETED] ondansetron (ZOFRAN) IV  4 mg  Intravenous Once  . pantoprazole  80 mg Oral Daily  . pneumococcal 23 valent vaccine  0.5 mL Intramuscular Tomorrow-1000  . terazosin  5 mg Oral QHS  . [DISCONTINUED] aspirin EC  325 mg Oral Daily   Assessment: 66 yo male presented with chest pain. Pharmacy asked to start IV heparin. During previous hospitalization (11/29 - 12/1), patient with heparin level of 0.44 with infusion rate of 1500 units/hr (18.5 units/kg/hr).   He had a recent re-do CABG and now presents with ongoing chest pain.  The plan is eventual cardiac cath.  His repeat heparin level is within goal range at 0.38 IU/ml on 1400 units/hr.  No noted bleeding.  Goal of Therapy:  Heparin level 0.3-0.7 units/ml Monitor platelets by anticoagulation protocol: Yes   Plan:  1. Will continue the Heparin infusion at 1400 units/hr (17 units/kg/hr). 2. Heparin level and CBC daily.   Nadara Mustard, PharmD., MS Clinical Pharmacist Pager:  [phone] Thank you for allowing pharmacy to be part of this patients care team. 10/16/2012,1:05 PM

## 2012-10-16 NOTE — ED Provider Notes (Signed)
History     CSN: [id number]  Arrival date & time 10/16/12  4403   First MD Initiated Contact with Patient 10/16/12 0444      Chief Complaint  Patient presents with  . Chest Pain    The history is provided by the patient.   the patient has a history coronary artery disease status post CABG x2 this year.  Get a repeat heart catheterization approximately 2 weeks ago for abnormal EMS EKG and at that time all of his grafts were patent.  The patient reports he developed chest pressure and tightness with shortness of breath that began this morning.  He took nitroglycerin as well as aspirin without relief and thus he called EMS.  EMS gave him nitroglycerin and he states mild improvement in his symptoms but still ongoing chest pain on arrival to the emergency department.  EKG obtained by EMS demonstrates significant inferior lateral ST depression and T-wave inversions concerning for ischemia.  The patient denies back pain.  He reports ongoing discomfort in his jaw.  He reports compliance with his medications.  He denies nausea vomiting diarrhea.  No fevers chills.  No cough or congestion.  No unilateral leg swelling.  His pain is still moderate to severe at this time.  Past Medical History  Diagnosis Date  . Hypertension   . Hypercholesterolemia   . Anginal pain   . DDD (degenerative disc disease), cervical   . Arthritis     "in my back and neck" (09-08-2012)  . Malaria     "I've had it a couple times in the 1960's; almost died from it" (09/08/2012)  . PTSD (post-traumatic stress disorder)   . NSTEMI (non-ST elevated myocardial infarction) 11/2011    anterior  . NSTEMI (non-ST elevated myocardial infarction) 09/08/2012  . Coronary artery disease     Past Surgical History  Procedure Date  . Back surgery   . Lumbar disc surgery 1980's?  . Tonsillectomy     "as a kid" (09-08-12)  . Coronary artery bypass graft 12/02/2011    Procedure: CORONARY ARTERY BYPASS GRAFTING (CABG);  Surgeon:  Alleen Borne, MD;  Location: [facility] OR;  Service: Open Heart Surgery;  Laterality: N/A;  . Cardiac catheterization 11/2011  . Cataract extraction w/ intraocular lens  implant, bilateral 1990's  . Coronary artery bypass graft 09/19/2012    Procedure: REDO CORONARY ARTERY BYPASS GRAFTING (CABG);  Surgeon: Alleen Borne, MD;  Location: [facility] OR;  Service: Open Heart Surgery;  Laterality: N/A;    History reviewed. No pertinent family history.  History  Substance Use Topics  . Smoking status: Former Smoker -- 0.5 packs/day for 50 years    Types: Cigarettes    Quit date: 12/02/2011  . Smokeless tobacco: Never Used  . Alcohol Use: Yes     Comment: September 08, 2012 "haven't had a drink in > 25 years; never had problem w/it"      Review of Systems  Cardiovascular: Positive for chest pain.  All other systems reviewed and are negative.    Allergies  Review of patient's allergies indicates no known allergies.  Home Medications   Current Outpatient Rx  Name  Route  Sig  Dispense  Refill  . ASPIRIN 325 MG PO TBEC   Oral   Take 1 tablet (325 mg total) by mouth daily.   30 tablet      . ATORVASTATIN CALCIUM 10 MG PO TABS   Oral   Take 10 mg by mouth daily.         Marland Kitchen  CLOPIDOGREL BISULFATE 75 MG PO TABS   Oral   Take 1 tablet (75 mg total) by mouth daily with breakfast.   30 tablet   11   . FINASTERIDE 5 MG PO TABS   Oral   Take 5 mg by mouth daily.         . FUROSEMIDE 20 MG PO TABS   Oral   Take 1 tablet (20 mg total) by mouth 2 (two) times daily.   60 tablet   5   . HYDROCODONE-ACETAMINOPHEN 10-325 MG PO TABS   Oral   Take 1 tablet by mouth every 6 (six) hours as needed for pain.   30 tablet   0   . ISOSORBIDE MONONITRATE ER 30 MG PO TB24   Oral   Take 1 tablet (30 mg total) by mouth daily.   30 tablet   5   . METOPROLOL TARTRATE 50 MG PO TABS   Oral   Take 25 mg by mouth 2 (two) times daily.         Marland Kitchen NITROGLYCERIN 0.4 MG SL SUBL   Sublingual   Place 1  tablet (0.4 mg total) under the tongue every 5 (five) minutes x 3 doses as needed for chest pain.   25 tablet   5   . OMEPRAZOLE 20 MG PO CPDR   Oral   Take 20 mg by mouth 2 (two) times daily.         Marland Kitchen TERAZOSIN HCL 5 MG PO CAPS   Oral   Take 5 mg by mouth at bedtime.           BP 103/64  Pulse 72  Temp 98.7 F (37.1 C)  Resp 18  Ht 5' 10.08" (1.78 m)  Wt 177 lb 14.6 oz (80.7 kg)  BMI 25.47 kg/m2  SpO2 100%  Physical Exam  Nursing note and vitals reviewed. Constitutional: He is oriented to person, place, and time. He appears well-developed and well-nourished.  HENT:  Head: Normocephalic and atraumatic.  Eyes: EOM are normal.  Neck: Normal range of motion.  Cardiovascular: Normal rate, regular rhythm, normal heart sounds and intact distal pulses.   Pulmonary/Chest: Effort normal and breath sounds normal. No respiratory distress.  Abdominal: Soft. He exhibits no distension. There is no tenderness.  Musculoskeletal: Normal range of motion.  Neurological: He is alert and oriented to person, place, and time.  Skin: Skin is warm and dry.  Psychiatric: He has a normal mood and affect. Judgment normal.    ED Course  Procedures (including critical care time)  CRITICAL CARE Performed by: Lyanne Co Total critical care time: 35 Critical care time was exclusive of separately billable procedures and treating other patients. Critical care was necessary to treat or prevent imminent or life-threatening deterioration. Critical care was time spent personally by me on the following activities: development of treatment plan with patient and/or surrogate as well as nursing, discussions with consultants, evaluation of patient's response to treatment, examination of patient, obtaining history from patient or surrogate, ordering and performing treatments and interventions, ordering and review of laboratory studies, ordering and review of radiographic studies, pulse oximetry and  re-evaluation of patient's condition.   Labs Reviewed  CBC WITH DIFFERENTIAL - Abnormal; Notable for the following:    RBC 3.83 (*)     Hemoglobin 10.6 (*)     HCT 32.2 (*)     All other components within normal limits  COMPREHENSIVE METABOLIC PANEL - Abnormal; Notable for the following:  Glucose, Bld 135 (*)     BUN 27 (*)     Albumin 3.4 (*)     GFR calc non Af Amer 85 (*)     All other components within normal limits  PROTIME-INR  HEPARIN LEVEL (UNFRACTIONATED)  TROPONIN I   Dg Chest 2 View  10/14/2012  *RADIOLOGY REPORT*  Clinical Data: CABG and November 2013, follow-up  CHEST - 2 VIEW  Comparison: None.  Findings: No active infiltrate or effusion is seen.  Cardiomegaly is stable.  Median sternotomy sutures are noted.  There are degenerative changes throughout the thoracic spine.  IMPRESSION: Stable cardiomegaly.  No active lung disease.   Original Report Authenticated By: Dwyane Dee, M.D.    Dg Chest Portable 1 View  10/16/2012  *RADIOLOGY REPORT*  Clinical Data: Chest pain and shortness of breath.  PORTABLE CHEST - 1 VIEW  Comparison: 10/14/2012  Findings: Stable appearance of postoperative changes in the mediastinum.  Shallow inspiration.  Borderline heart size with normal pulmonary vascularity.  Suggestion of mild atelectasis in the right lung base.  No focal consolidation.  No blunting of costophrenic angles.  No pneumothorax.  Mediastinal contours appear intact.  IMPRESSION: Shallow inspiration with atelectasis in the right lung base.   Original Report Authenticated By: Burman Nieves, M.D.     Date: 10/16/2012 0445  Rate: 89  Rhythm: normal sinus rhythm  QRS Axis: normal  Intervals: normal  ST/T Wave abnormalities: normal  Conduction Disutrbances: none  Narrative Interpretation:   Old EKG Reviewed: No significant changes noted   Date: 10/16/2012 0529am  Rate: 79  Rhythm: normal sinus rhythm  QRS Axis: normal  Intervals: normal  ST/T Wave abnormalities: normal   Conduction Disutrbances: none  Narrative Interpretation:   Old EKG Reviewed: No significant changes noted    1. Acute coronary syndrome       MDM  The patient's EKG normalized on arrival of emergency department.  This is the exact same presentation as his presentation hospital 2 weeks ago which point his repeat heart catheterization demonstrated patent grafts.  It's unclear to me why his EKG is dated what appears to be ischemic changes for EMS yet resolved on arrival to the emergency [place] despite his ongoing chest pain.  Some of this may represent transient occlusion or coronary spasm.  I discussed his case with his cardiology team who will be down in the emergency department to evaluate the patient.  At this time his chest pain has resolved with morphine and nitro drip.  The patient is on heparin drip.  His vital signs are stable.  Troponin is pending.        Lyanne Co, MD 10/16/12 [phone]

## 2012-10-16 NOTE — ED Notes (Signed)
Multiple family members at bedside

## 2012-10-16 NOTE — ED Notes (Signed)
Report was given to 2900

## 2012-10-17 ENCOUNTER — Encounter (HOSPITAL_COMMUNITY)
Admission: EM | Disposition: A | Discharge status: Home / Self Care | Payer: Self-pay | Source: Home / Self Care | Attending: Internal Medicine

## 2012-10-17 DIAGNOSIS — Z951 Presence of aortocoronary bypass graft: Secondary | ICD-10-CM | POA: Diagnosis not present

## 2012-10-17 DIAGNOSIS — I2510 Atherosclerotic heart disease of native coronary artery without angina pectoris: Secondary | ICD-10-CM | POA: Diagnosis not present

## 2012-10-17 DIAGNOSIS — I214 Non-ST elevation (NSTEMI) myocardial infarction: Secondary | ICD-10-CM | POA: Diagnosis not present

## 2012-10-17 HISTORY — PX: LEFT HEART CATHETERIZATION WITH CORONARY/GRAFT ANGIOGRAM: SHX5450

## 2012-10-17 LAB — CBC
Hemoglobin: 9.6 g/dL — ABNORMAL LOW (ref 13.0–17.0)
Platelets: 264 10*3/uL (ref 150–400)
RBC: 3.51 MIL/uL — ABNORMAL LOW (ref 4.22–5.81)

## 2012-10-17 LAB — PROTIME-INR
INR: 1.24 (ref 0.00–1.49)
Prothrombin Time: 15.4 seconds — ABNORMAL HIGH (ref 11.6–15.2)

## 2012-10-17 LAB — LIPID PANEL
Total CHOL/HDL Ratio: 2.6 RATIO
VLDL: 18 mg/dL (ref 0–40)

## 2012-10-17 LAB — BASIC METABOLIC PANEL
CO2: 24 mEq/L (ref 19–32)
Calcium: 8.4 mg/dL (ref 8.4–10.5)
Chloride: 108 mEq/L (ref 96–112)
GFR calc Af Amer: >90 mL/min (ref >90)
Sodium: 140 mEq/L (ref 135–145)

## 2012-10-17 LAB — GLUCOSE, CAPILLARY: Glucose-Capillary: 83 mg/dL (ref 70–99)

## 2012-10-17 LAB — HEPARIN LEVEL (UNFRACTIONATED): Heparin Unfractionated: 0.63 IU/mL (ref 0.30–0.70)

## 2012-10-17 SURGERY — LEFT HEART CATHETERIZATION WITH CORONARY/GRAFT ANGIOGRAM
Anesthesia: LOCAL

## 2012-10-17 MED ORDER — LIDOCAINE HCL (PF) 1 % IJ SOLN
INTRAMUSCULAR | Status: AC
  Filled 2012-10-17: qty 30

## 2012-10-17 MED ORDER — ISOSORBIDE MONONITRATE ER 30 MG PO TB24
30.0000 mg | ORAL_TABLET | Freq: Every day | ORAL | Status: DC
  Administered 2012-10-17 – 2012-10-19 (×3): 30 mg via ORAL
  Filled 2012-10-17 (×3): qty 1

## 2012-10-17 MED ORDER — PNEUMOCOCCAL VAC POLYVALENT 25 MCG/0.5ML IJ INJ
0.5000 mL | INJECTION | INTRAMUSCULAR | Status: AC
  Filled 2012-10-17: qty 0.5

## 2012-10-17 MED ORDER — SODIUM CHLORIDE 0.9 % IV SOLN
1.0000 mL/kg/h | INTRAVENOUS | Status: AC
  Administered 2012-10-17: 1 mL/kg/h via INTRAVENOUS

## 2012-10-17 MED ORDER — MIDAZOLAM HCL 2 MG/2ML IJ SOLN
INTRAMUSCULAR | Status: AC
  Filled 2012-10-17: qty 2

## 2012-10-17 MED ORDER — FENTANYL CITRATE 0.05 MG/ML IJ SOLN
INTRAMUSCULAR | Status: AC
  Filled 2012-10-17: qty 2

## 2012-10-17 MED ORDER — RANOLAZINE ER 500 MG PO TB12
500.0000 mg | ORAL_TABLET | Freq: Two times a day (BID) | ORAL | Status: DC
  Administered 2012-10-17 (×2): 500 mg via ORAL
  Filled 2012-10-17 (×5): qty 1

## 2012-10-17 MED ORDER — INFLUENZA VIRUS VACC SPLIT PF IM SUSP
0.5000 mL | INTRAMUSCULAR | Status: AC
  Filled 2012-10-17: qty 0.5

## 2012-10-17 MED ORDER — NITROGLYCERIN 0.2 MG/ML ON CALL CATH LAB
INTRAVENOUS | Status: AC
  Filled 2012-10-17: qty 1

## 2012-10-17 MED ORDER — HEPARIN (PORCINE) IN NACL 2-0.9 UNIT/ML-% IJ SOLN
INTRAMUSCULAR | Status: AC
  Filled 2012-10-17: qty 1000

## 2012-10-17 NOTE — Progress Notes (Signed)
[facility] DAILY PROGRESS NOTE  NAME:  [name].   MRN: [id number] DOB:  January 15, 1946   ADMIT DATE: 10/16/2012   Patient Description   66 y.o. male with PMH below: presented with severe, progressive SSCP -- ECG with AnteroSeptal TWI & Troponin level + for NSTEMI.    Past Medical History  Diagnosis Date  . Hypertension   . Hypercholesterolemia   . Anginal pain   . DDD (degenerative disc disease), cervical   . Arthritis     "in my back and neck" (09-20-2012)  . Malaria     "I've had it a couple times in the 1960's; almost died from it" (Sep 20, 2012)  . PTSD (post-traumatic stress disorder)   . NSTEMI (non-ST elevated myocardial infarction) 11/2011    anterior  . NSTEMI (non-ST elevated myocardial infarction) 09-20-12  . Coronary artery disease   . CAD, CABG X 09 Nov 2011. re-do CABG X1 09/19/12 12/04/2011   Clinical Course: Length of Stay:  LOS: 1 day   Subjective:   Today [name]. notes a slight substernal pressure - worse with lying supine than sitting up.  NoSOB  Objective:  Temp:  [98.2 F (36.8 C)-98.6 F (37 C)] 98.4 F (36.9 C) (12/12 0730) Pulse Rate:  [52-77] 56  (12/12 0730) Resp:  [10-24] 18  (12/12 0730) BP: (69-131)/(36-84) 103/57 mmHg (12/12 0730) SpO2:  [96 %-100 %] 99 % (12/12 0730) Weight:  [80 kg (176 lb 5.9 oz)-81 kg (178 lb 9.2 oz)] 81 kg (178 lb 9.2 oz) (12/12 0500) Weight change: -0.7 kg (-1 lb 8.7 oz) Physical Exam: General appearance: alert, cooperative, appears stated age and no distress Neck: no adenopathy, no carotid bruit, no JVD and supple, symmetrical, trachea midline Lungs: clear to auscultation bilaterally, normal percussion bilaterally and initially mild basal rales - cleared with cough. Heart: regular rate and rhythm, S1, S2 normal, no murmur, click, rub or gallop and no rub Abdomen: soft, non-tender; bowel sounds normal; no masses,  no organomegaly Extremities: extremities normal,  atraumatic, no cyanosis or edema Pulses: 2+ and symmetric Neurologic: Grossly normal  Intake/Output from previous day: 12/11 0701 - 12/12 0700 In: 1176.9 [P.O.:360; I.V.:816.9] Out: 700 [Urine:700]  Intake/Output Summary (Last 24 hours) at 10/17/12 0933 Last data filed at 10/17/12 0800  Gross per 24 hour  Intake 1276.88 ml  Output    700 ml  Net 576.88 ml     Lab 10/17/12 0522 10/16/12 0502  NA 140 139  K 3.6 3.5  CL 108 105  CO2 24 19  BUN 19 27*  CREATININE 0.83 0.97     Lab 10/17/12 0522 10/16/12 0502  WBC 6.4 7.6  HGB 9.6* 10.6*  HCT 30.4* 32.2*  PLT 264 258   No components found with this basename: TROP:3, BNP:3   Lab 10/16/12 2038 10/16/12 1629 10/16/12 0901 10/16/12 0747  TROPONINI 1.46* 1.60* 1.08* 0.71*    Imaging: ECG- resolved Anteroseptal TWI from admission ECG.  MAR Reviewed  Assessment/Plan:  Principal Problem:  *NSTEMI 10/16/12 Active Problems:  Smoker  HTN (hypertension)  ICM, EF 40% Jan 2013, now 50-55% Oct 2013  Dyslipidemia  BPH (benign prostatic hyperplasia)  CAD, CABG X 09 Nov 2011. re-do CABG X1 09/19/12  At this point with positive Troponin levels and dynamic TWI, we have to presume CAD related myocardial injury with a TIMI Risk Score of at least 4-- LHC/Angiography is the most definitive option, so I agree with LHC today.  ECG changes would suggest LAD distribution ischemia, leaving the possibility of proximal LAD/Diag disease retrograde from the LIMA -- with no LM to approach from, we would not have an percutaneous option.  Yet another possibility is Dressler's Pericarditis -- ~ 1 month post MI & CABG, Sx are improved with sitting up, however, I do not hear a rub on exam. -- Will order echo to look for pericardial effusion.  Continue to up-titrate cardiac medications.  Cardiac Cath procedure discussed with R/B/A/I explained in detail.  Pt agrees to proceed.  Time Spent Directly with Patient:  25 minutes   Izzy Doubek  W, M.D., M.S. [facility] [address] [place], [place]  [address]  [phone]  10/17/2012 9:33 AM

## 2012-10-17 NOTE — CV Procedure (Signed)
[facility] CARDIAC CATHETERIZATION REPORT  NAME:  [name].   MRN: [id number] DOB:  [birth year]   ADMIT DATE: 10/16/2012 Procedure Date: 10/17/2012  INTERVENTIONAL CARDIOLOGIST: Marykay Lex, M.D., MS PRIMARY CARE PROVIDER: Provider Not In System PRIMARY CARDIOLOGIST: Runell Gess, MD   PATIENT:  [name]. is a 66 y.o. male with recent re-do CABG in early November 2013 to revascularize a large lateral OM Branch/Circumflex after the SVG-OM from Jan 2013(for a NSTEMI, LIMA-LAD, SVG-RPL & SVG-OM) was found to be occluded along with the native Left Main.  His LIMA is quite tortuous & attaches to a diminutive bifurcating distal LAD.  There is a significant early mid LAD lesion retrograde from the LIMA attachment that would jeopardize flow the the Circumflex system, therefore a new SVG was placed on Sep 19, 2012 after his October NSTEMI.   He then presented in late November with SSx concerning for ACS with dynamic lateral ST-T changes on ECG.  Urgent angiography failed to reveal a culprit lesion & a patent new SVG.  He was discharge in stable condition with plan to optimize medical therapy. He has progressed slowly, but presented yet again on 12/11 with recurrent ACS symptoms & ruled in for a small NSTEMI.  This time, he had dynamic septal T wave inversions.   He is referred for re-look angiography.  PRE-OPERATIVE DIAGNOSIS:    NSTEMI  Known CAD - with redo CABG  PROCEDURES PERFORMED:    Left Heart Catheterization with Native and Graft Angiography  PROCEDURE: Consent:  Risks of procedure as well as the alternatives and risks of each were explained to the (patient/caregiver).  Consent for procedure obtained. Consent for signed by MD and patient with RN witness -- placed on chart.  PROCEDURE: The patient was brought to the 2nd Floor [facility] in the fasting state and prepped and draped in the usual sterile fashion for  Right groin access. Sterile technique was used including antiseptics, cap, gloves, gown, hand hygiene, mask and sheet.  Skin prep: Chlorhexidine.  Time Out: Verified patient identification, verified procedure, site/side was marked, verified correct patient position, special equipment/implants available, medications/allergies/relevent history reviewed, required imaging and test results available.  Performed  Access: Right Common Femoral Artery; 5 Fr Sheath - fluoroscopically guided modified Seldinger's technique. Diagnostic:  5Fr catheters advanced and exchanged over a wire; JR4-IMA catheter over long-exchange wire.  Left Coronary Artery Angiography: Not performed -- known occluded  Right Coronary Artery,SVG-RPDA, SVG-OM1 Angiography: JR4  LIMA-LAD Angiography: IMA catheter (over long-exchange wire after JR4 catheter redirected into Left Subclavian Artery.   LV Hemodynamics (LV Gram): Angled Pigtail  ANESTHESIA:   Local Lidocaine 18 ml SEDATION:  2 mg IV Versed, 25 mcg IV fentanyl ; Premedication: 5mg  PO Valium MEDICATIONS: Omnipaque Contrast 80ml  Hemodynamics:  Central Aortic / Mean Pressures: 136/66 mmHg, mean 93 mmHg  Left Ventricular Pressures / EDP: 133/6 mmHg; EDP 22 mmHg  Left Ventriculography:  Not performed.  Coronary Anatomy:  Left Main: 100% Aorto-Ostial occlusion (previoulsy described) LAD: Fills via patent LIMA to mid-distal LAD; Antegrade flow reveals a "bifurcation into 2 equal sized vessels (LAD & D2); Retrograde flow reveals a large SP trunk distal to a very proximal D1, flow is noted to reach the ostially occluded Left Main.  There is (as was seen, but not noted previously) a ~90% mid LAD stenosis just after a large Septal trunk as well as a small D1 with ostial ~80% stenosis  both of which are jeopardizied by the Mid LAD lesion inhibiting retrograde flow.  Not approachable percutaneously. D1: Small to moderate caliber vesse; diffuse mild luminal irregularities.  D2:  Moderate caliber vessel, covers distal anterolateral wall; mild luminal irregularities. Left Circumflex: Fills via SVG-OM1; Retrograde filling of native Circumflex reveals the graft attached to the largest of 3 branches from a Lateral OM2 then back fills up to the proximal circumflex/occluded Left Main as well as down the AV Groove Circumflex that bifurcates into 2 small LPL branches. The native Circumflex/OM system is otherwise relatively free of angiographically significant disease.  OM1: Large "trifurcating" lateral OM filled via the newly placed SVG-OM1; antegrade flow down the the largest "superior" branch reveals minimal luminal irregularities.  RCA:  Small to moderate caliber vessel with diffuse luminal irregularities throughout. It terminates as what appears to be the Posterior Descending Artery with a small Posterior Lateral Branch; there was damping of the pressures with the insertion of the catheter which reveals likely police 40% ostial stenosis. There is no true evidence of to and fro flow from the vein graft.  RPDA: small large-caliber vessel which tapers to the apex, diffuse luminal irregularities.  RPL Sysytem:The Right Posterior AV Groove Branch fills via the SVG-RPL  Graft Anatomy:  LIMA-LAD: Widely patent, tortuous vessel.  SVG-RPL: the graft is patent to what appears to be a small caliber PL branch with retrograde flow up what appears to ostium of the native Right Coronary Artery but not down the PDA.  Minimal luminal irregularities in a moderate caliber SVG.  SVG-OM1: Widely patent graft to extensive circumflex distribution.   5 Fr RFA Sheath to be removed in PACU Holding Area with direct manual pressure for hemostasis.  EBL:   < 15 ml  PATIENT DISPOSITION:    The patient was transferred to the PACU holding area in a hemodynamicaly stable, chest pain free condition.  The patient tolerated the procedure well, and there were no complications.  The patient was stable before,  during, and after the procedure.  POST-OPERATIVE DIAGNOSIS:    No obvious culprit lesion, no new stenosis to explain resting Angina & NSTEMI.    Jeopardized proximal LAD Septal perforator could explain dynamic Septal TWI on ECG - but this is not an acute finding and is not approachable.  Mildly elevated LVEDP post-hydration.  PLAN OF CARE:  Monitor overnight and up-titrate anti-anginal medications -- add Ranexa to Imdur.    The positional nature of his chest pain suggests the possibility of pericarditis, would allow overnight hydration for contrast washout, and start NSAID therapy in AM.  Will need to ambulate in the AM and reassess, but anticipate that he should be ready for discharge.   Marykay Lex, M.D., M.S. [facility] [address] [place], [place]  [address]  [phone]  10/17/2012 8:16 PM

## 2012-10-17 NOTE — Brief Op Note (Signed)
10/16/2012 - 10/17/2012  11:25 AM  PATIENT:  [name].  66 y.o. male with complex history of CABG in Jan 2013 after MI(severe MV Disease with 99% ostial LM --> presented in October 2013 with NSTEMI --> occluded SVG-OM & 100% LM occlusion --> redo CABG SVG-OM --> ACS presentation 11/28 (~2 weeks post-CABG) with cath showing patent grafts --> d/c on Med Rx.  Presented yesterday with similar ACS like Sx & Troponin + c/w NSTEMI.  Referred for cardiac cath with Native & Graft Angiography.  PRE-OPERATIVE DIAGNOSIS:  NSTEMI  POST-OPERATIVE DIAGNOSIS: No appreciable change from anatomy noted on 11/29 cath.  Hemodynamics:  LV: 133/6 mmHg; EDP 22 mmHg AoP: 136/66 mmHg, mean 93 mmHg  PROCEDURE:  Procedure(s) (LRB) with comments: LEFT HEART CATHETERIZATION WITH CORONARY/GRAFT ANGIOGRAM (N/A)  SURGEON:  Surgeon(s) and Role:    * Marykay Lex, MD - Primary  ANESTHESIA:   local and IV sedation; 2mg  Versed, 25 mcg Fentanyl  EBL: < 10  LOCAL MEDICATIONS USED:  LIDOCAINE 18 ml  PROCEDURE:  5Fr RFA access - fluoroscopically guided, modified Seldinger Technique; JR4 for RCA, SVG-RPL & SVG-OM angiography -- directed over wire to LSubclavian -- > IMA catheter for LIMA-LAD angiography; Angled Pigtail for LV Hemodynamics. Sheath to be removed in PACU Holding based upon ACT.  DICTATION: .Note written in EPIC  PLAN OF CARE: Overnight admission -- Check 2D Echo, up-titrate anti-anginal meds & initiate Rx for Pericarditis in AM to avoid post-contrast Renal failure  PATIENT DISPOSITION:  PACU - hemodynamically stable.   Delay start of Pharmacological VTE agent (>24hrs) due to surgical blood loss or risk of bleeding: not applicable  Dannon Nguyenthi W, M.D., M.S. [facility] [address] [place], [place]  [address]  [phone] Pager # [phone] 10/17/2012 11:33 AM

## 2012-10-17 NOTE — Progress Notes (Signed)
ANTICOAGULATION CONSULT NOTE - Follow UP  Pharmacy Consult for heparin Indication: chest pain/ACS  No Known Allergies  Patient Measurements: Height: 5\' 10"  (177.8 cm) Weight: 178 lb 9.2 oz (81 kg) IBW/kg (Calculated) : 73  Heparin Dosing Weight: 81 kg   Vital Signs: Temp: 98.4 F (36.9 C) (12/12 0730) Temp src: Oral (12/12 0730) BP: 103/57 mmHg (12/12 0730) Pulse Rate: 56  (12/12 0730)  Labs:  Basename 10/17/12 0522 10/16/12 2038 10/16/12 1629 10/16/12 1137 10/16/12 0901 10/16/12 0502  HGB 9.6* -- -- -- -- 10.6*  HCT 30.4* -- -- -- -- 32.2*  PLT 264 -- -- -- -- 258  APTT -- -- -- -- -- --  LABPROT 15.4* -- -- -- -- 14.0  INR 1.24 -- -- -- -- 1.09  HEPARINUNFRC 0.63 -- -- 0.38 -- --  CREATININE 0.83 -- -- -- -- 0.97  CKTOTAL -- -- -- -- 76 --  CKMB -- -- -- -- 5.1* --  TROPONINI -- 1.46* 1.60* -- 1.08* --    Estimated Creatinine Clearance: 91.6 ml/min (by C-G formula based on Cr of 0.83).   Medical History: Past Medical History  Diagnosis Date  . Hypertension   . Hypercholesterolemia   . Anginal pain   . DDD (degenerative disc disease), cervical   . Arthritis     "in my back and neck" (September 28, 2012)  . Malaria     "I've had it a couple times in the 1960's; almost died from it" (09-28-2012)  . PTSD (post-traumatic stress disorder)   . NSTEMI (non-ST elevated myocardial infarction) 11/2011    anterior  . NSTEMI (non-ST elevated myocardial infarction) Sep 28, 2012  . Coronary artery disease   . CAD, CABG X 09 Nov 2011. re-do CABG X1 09/19/12 12/04/2011    Medications:  Scheduled:     . [COMPLETED] aspirin  324 mg Oral Pre-Cath  . aspirin EC  81 mg Oral Daily  . atorvastatin  10 mg Oral Daily  . clopidogrel  75 mg Oral Q breakfast  . diazepam  5 mg Oral On Call  . finasteride  5 mg Oral Daily  . furosemide  20 mg Oral BID  . influenza  inactive virus vaccine  0.5 mL Intramuscular Tomorrow-1000  . metoprolol  25 mg Oral BID  . pantoprazole  80 mg Oral Daily   . pneumococcal 23 valent vaccine  0.5 mL Intramuscular Tomorrow-1000  . sodium chloride  3 mL Intravenous Q12H  . terazosin  5 mg Oral QHS  . [DISCONTINUED] aspirin EC  325 mg Oral Daily   Assessment: 66 yo male presented with chest pain. Pharmacy asked to start IV heparin. During previous hospitalization (11/29 - 12/1), patient with heparin level of 0.44 with infusion rate of 1500 units/hr (18.5 units/kg/hr).   He had a recent re-do CABG and presented this admit  with ongoing chest pain.  The plan is eventual cardiac cath.  His  heparin level this AM is 0.63 which is within goal range on 1400 units/hr.  No noted bleeding.  Goal of Therapy:  Heparin level 0.3-0.7 units/ml Monitor platelets by anticoagulation protocol: Yes   Plan:  1. Will continue the Heparin infusion at 1400 units/hr (17 units/kg/hr). 2. Heparin level and CBC daily.   Thank you for allowing pharmacy to be part of this patients care team. Noah Delaine, RPh Clinical Pharmacist Pager: [phone] 10/17/2012,9:10 AM

## 2012-10-18 DIAGNOSIS — I300 Acute nonspecific idiopathic pericarditis: Secondary | ICD-10-CM | POA: Diagnosis not present

## 2012-10-18 DIAGNOSIS — I2510 Atherosclerotic heart disease of native coronary artery without angina pectoris: Secondary | ICD-10-CM | POA: Diagnosis not present

## 2012-10-18 DIAGNOSIS — I2589 Other forms of chronic ischemic heart disease: Secondary | ICD-10-CM | POA: Diagnosis not present

## 2012-10-18 HISTORY — PX: TRANSTHORACIC ECHOCARDIOGRAM: SHX275

## 2012-10-18 LAB — POCT ACTIVATED CLOTTING TIME: Activated Clotting Time: 160 seconds

## 2012-10-18 LAB — CBC
HCT: 29.9 % — ABNORMAL LOW (ref 39.0–52.0)
MCH: 26.7 pg (ref 26.0–34.0)
MCHC: 30.8 g/dL (ref 30.0–36.0)
MCV: 86.9 fL (ref 78.0–100.0)
RDW: 15.3 % (ref 11.5–15.5)

## 2012-10-18 LAB — BASIC METABOLIC PANEL
BUN: 15 mg/dL (ref 6–23)
Creatinine, Ser: 0.8 mg/dL (ref 0.50–1.35)
GFR calc Af Amer: >90 mL/min (ref >90)
GFR calc non Af Amer: >90 mL/min (ref >90)

## 2012-10-18 MED ORDER — IBUPROFEN 600 MG PO TABS
600.0000 mg | ORAL_TABLET | Freq: Three times a day (TID) | ORAL | Status: DC
  Administered 2012-10-18 – 2012-10-19 (×5): 600 mg via ORAL
  Filled 2012-10-18 (×7): qty 1

## 2012-10-18 MED ORDER — RANOLAZINE ER 500 MG PO TB12
1000.0000 mg | ORAL_TABLET | Freq: Two times a day (BID) | ORAL | Status: DC
  Administered 2012-10-18 – 2012-10-19 (×3): 1000 mg via ORAL
  Filled 2012-10-18 (×4): qty 2

## 2012-10-18 NOTE — Progress Notes (Signed)
  Echocardiogram 2D Echocardiogram has been performed.  Aser Nylund 10/18/2012, 10:01 AM

## 2012-10-18 NOTE — Progress Notes (Signed)
CARDIAC REHAB PHASE I   PRE:  Rate/Rhythm: 59 Sb    BP: sitting 86/60    SaO2: 98 RA  MODE:  Ambulation: 510 ft   POST:  Rate/Rhythm: 68    BP: sitting 110/62     SaO2: 100 RA  Tolerated well. Slight dizziness walking (BP low before walk). No CP. No ed needed. Pt still struggling with communication btn [facility] and [facility]. Will talk with [facility].  [id number]  [name] CES, ACSM

## 2012-10-18 NOTE — Progress Notes (Signed)
[facility]  Subjective: Doing better.  Chest is a little sore.  Objective: Vital signs in last 24 hours: Temp:  [98 F (36.7 C)-98.7 F (37.1 C)] 98.4 F (36.9 C) (12/13 0352) Pulse Rate:  [50-68] 57  (12/13 0352) Resp:  [14-20] 14  (12/13 0352) BP: (88-153)/(35-70) 93/48 mmHg (12/13 0352) SpO2:  [99 %-100 %] 100 % (12/13 0352) Weight:  [80.5 kg (177 lb 7.5 oz)] 80.5 kg (177 lb 7.5 oz) (12/13 0511) Last BM Date: 10/17/12  Intake/Output from previous day: 12/12 0701 - 12/13 0700 In: 1224 [P.O.:720; I.V.:504] Out: 1100 [Urine:1100] Intake/Output this shift:    Medications Current Facility-Administered Medications  Medication Dose Route Frequency Provider Last Rate Last Dose  . 0.9 %  sodium chloride infusion   Intravenous Continuous Nada Boozer, NP   10 mL/hr at 10/16/12 1150  . acetaminophen (TYLENOL) tablet 650 mg  650 mg Oral Q4H PRN Nada Boozer, NP      . ALPRAZolam Prudy Feeler) tablet 0.25 mg  0.25 mg Oral BID PRN Nada Boozer, NP      . aspirin EC tablet 81 mg  81 mg Oral Daily Nada Boozer, NP   81 mg at 10/17/12 0906  . atorvastatin (LIPITOR) tablet 10 mg  10 mg Oral Daily Nada Boozer, NP   10 mg at 10/17/12 0906  . clopidogrel (PLAVIX) tablet 75 mg  75 mg Oral Q breakfast Nada Boozer, NP   75 mg at 10/18/12 0735  . finasteride (PROSCAR) tablet 5 mg  5 mg Oral Daily Nada Boozer, NP   5 mg at 10/17/12 4540  . furosemide (LASIX) tablet 20 mg  20 mg Oral BID Nada Boozer, NP   20 mg at 10/18/12 0735  . HYDROcodone-acetaminophen (NORCO) 10-325 MG per tablet 1 tablet  1 tablet Oral Q6H PRN Nada Boozer, NP      . influenza  inactive virus vaccine (FLUZONE/FLUARIX) injection 0.5 mL  0.5 mL Intramuscular Tomorrow-1000 Chrystie Nose, MD      . isosorbide mononitrate (IMDUR) 24 hr tablet 30 mg  30 mg Oral Daily Marykay Lex, MD   30 mg at 10/17/12 1502  . metoprolol tartrate (LOPRESSOR) tablet 25 mg  25 mg Oral BID Nada Boozer, NP   25 mg at  10/17/12 2150  . morphine 2 MG/ML injection 2 mg  2 mg Intravenous Q2H PRN Nada Boozer, NP      . nitroGLYCERIN (NITROSTAT) SL tablet 0.4 mg  0.4 mg Sublingual Q5 Min x 3 PRN Nada Boozer, NP      . ondansetron [facility]) injection 4 mg  4 mg Intravenous Q6H PRN Nada Boozer, NP      . pantoprazole (PROTONIX) EC tablet 80 mg  80 mg Oral Daily Nada Boozer, NP   80 mg at 10/17/12 0907  . pneumococcal 23 valent vaccine (PNU-IMMUNE) injection 0.5 mL  0.5 mL Intramuscular Tomorrow-1000 Chrystie Nose, MD      . ranolazine (RANEXA) 12 hr tablet 500 mg  500 mg Oral BID Marykay Lex, MD   500 mg at 10/17/12 2150  . terazosin (HYTRIN) capsule 5 mg  5 mg Oral QHS Nada Boozer, NP   5 mg at 10/17/12 2150  . zolpidem (AMBIEN) tablet 5 mg  5 mg Oral QHS PRN Nada Boozer, NP        PE: General appearance: alert, cooperative and no distress Lungs: clear to auscultation bilaterally Heart: regular rate and rhythm, S1, S2 normal, no murmur,  click, rub or gallop Extremities: No LEE Pulses: Radials 2+ and symmetric.  2+ PTs.  DPs 0 Skin: Warm and dry.  Right groin without ecchymosis, hematoma or bruit. Neurologic: Grossly normal  Lab Results:   Basename 10/18/12 0440 10/17/12 0522 10/16/12 0502  WBC 5.6 6.4 7.6  HGB 9.2* 9.6* 10.6*  HCT 29.9* 30.4* 32.2*  PLT 237 264 258   BMET  Basename 10/17/12 0522 10/16/12 0502  NA 140 139  K 3.6 3.5  CL 108 105  CO2 24 19  GLUCOSE 109* 135*  BUN 19 27*  CREATININE 0.83 0.97  CALCIUM 8.4 9.4   PT/INR  Basename 10/17/12 0522 10/16/12 0502  LABPROT 15.4* 14.0  INR 1.24 1.09   Cholesterol  Basename 10/17/12 0522  CHOL 93   Lipid Panel     Component Value Date/Time   CHOL 93 10/17/2012 0522   TRIG 90 10/17/2012 0522   HDL 36* 10/17/2012 0522   CHOLHDL 2.6 10/17/2012 0522   VLDL 18 10/17/2012 0522   LDLCALC 39 10/17/2012 0522    Cardiac Panel (last 3 results)  Basename 10/16/12 2038 10/16/12 1629 10/16/12 0901  CKTOTAL -- -- 76  CKMB  -- -- 5.1*  TROPONINI 1.46* 1.60* 1.08*  RELINDX -- -- RELATIVE INDEX IS INVALID    Studies/Results: Left Ventriculography: Not performed.  Coronary Anatomy:  Left Main: 100% Aorto-Ostial occlusion (previoulsy described) LAD: Fills via patent LIMA to mid-distal LAD; Antegrade flow reveals a "bifurcation into 2 equal sized vessels (LAD & D2); Retrograde flow reveals a large SP trunk distal to a very proximal D1, flow is noted to reach the ostially occluded Left Main. There is (as was seen, but not noted previously) a ~90% mid LAD stenosis just after a large Septal trunk as well as a small D1 with ostial ~80% stenosis both of which are jeopardizied by the Mid LAD lesion inhibiting retrograde flow. Not approachable percutaneously.  D1: Small to moderate caliber vesse; diffuse mild luminal irregularities.  D2: Moderate caliber vessel, covers distal anterolateral wall; mild luminal irregularities. Left Circumflex: Fills via SVG-OM1; Retrograde filling of native Circumflex reveals the graft attached to the largest of 3 branches from a Lateral OM2 then back fills up to the proximal circumflex/occluded Left Main as well as down the AV Groove Circumflex that bifurcates into 2 small LPL branches. The native Circumflex/OM system is otherwise relatively free of angiographically significant disease.  OM1: Large "trifurcating" lateral OM filled via the newly placed SVG-OM1; antegrade flow down the the largest "superior" branch reveals minimal luminal irregularities. RCA: Small to moderate caliber vessel with diffuse luminal irregularities throughout. It terminates as what appears to be the Posterior Descending Artery with a small Posterior Lateral Branch; there was damping of the pressures with the insertion of the catheter which reveals likely police 40% ostial stenosis. There is no true evidence of to and fro flow from the vein graft.  RPDA: small large-caliber vessel which tapers to the apex, diffuse luminal  irregularities.  RPL Sysytem:The Right Posterior AV Groove Branch fills via the SVG-RPL  Graft Anatomy:  LIMA-LAD: Widely patent, tortuous vessel.  SVG-RPL: the graft is patent to what appears to be a small caliber PL branch with retrograde flow up what appears to ostium of the native Right Coronary Artery but not down the PDA. Minimal luminal irregularities in a moderate caliber SVG. SVG-OM1: Widely patent graft to extensive circumflex distribution.  5 Fr RFA Sheath to be removed in PACU Holding Area with direct manual pressure  for hemostasis.  EBL: < 15 ml  PATIENT DISPOSITION:  The patient was transferred to the PACU holding area in a hemodynamicaly stable, chest pain free condition.  The patient tolerated the procedure well, and there were no complications.  The patient was stable before, during, and after the procedure. POST-OPERATIVE DIAGNOSIS:  No obvious culprit lesion, no new stenosis to explain resting Angina & NSTEMI.  Jeopardized proximal LAD Septal perforator could explain dynamic Septal TWI on ECG - but this is not an acute finding and is not approachable.  Mildly elevated LVEDP post-hydration. PLAN OF CARE:  Monitor overnight and up-titrate anti-anginal medications -- add Ranexa to Imdur.  The positional nature of his chest pain suggests the possibility of pericarditis, would allow overnight hydration for contrast washout, and start NSAID therapy in AM.  Will need to ambulate in the AM and reassess, but anticipate that he should be ready for discharge. Marykay Lex, M.D., M.S.  [facility]  [address]  [place], [place] [address]  [phone]  10/17/2012  8:16 PM  Assessment/Plan  Principal Problem:  *NSTEMI 10/16/12 Active Problems:  CAD, CABG X 09 Nov 2011. re-do CABG X1 09/19/12  Smoker  HTN (hypertension)  ICM, EF 40% Jan 2013, now 50-55% Oct 2013  Dyslipidemia  BPH (benign prostatic hyperplasia)  Plan:S/P LHC  revealing jeopardized proximal LAD Septal perforator which may explain dynamic Septal TWI on ECG.  Not an acute finding and is not approachable according to Dr. Herbie Baltimore.  Ranexa added.   Adding Ibuprofen due to concerns for pericarditis.   Mildly hypotensive.  BMET pending.  Likely DC home today or tomorrow.      LOS: 2 days    HAGER, BRYAN 10/18/2012 7:44 AM   Patient seen and examined. Agree with assessment and plan. Cines reviewed.  ? if small very proximal diagonal is the culprit vessel contributing to recurrent events, not amenable to PCI.  Will titrate ranexa to 1000 mg bid. Will check P2Y12 to asses for plavix responsivess and change to brilinta if not. Can transfer today.   Lennette Bihari, MD, [facility] 10/18/2012 8:19 AM

## 2012-10-19 DIAGNOSIS — I2510 Atherosclerotic heart disease of native coronary artery without angina pectoris: Secondary | ICD-10-CM | POA: Diagnosis not present

## 2012-10-19 DIAGNOSIS — I2589 Other forms of chronic ischemic heart disease: Secondary | ICD-10-CM | POA: Diagnosis not present

## 2012-10-19 DIAGNOSIS — I300 Acute nonspecific idiopathic pericarditis: Secondary | ICD-10-CM | POA: Diagnosis not present

## 2012-10-19 LAB — CBC
MCH: 27 pg (ref 26.0–34.0)
MCHC: 31.9 g/dL (ref 30.0–36.0)
Platelets: 250 10*3/uL (ref 150–400)

## 2012-10-19 MED ORDER — LISINOPRIL 2.5 MG PO TABS: 1.2500 mg | ORAL_TABLET | Freq: Every day | ORAL | Status: DC

## 2012-10-19 MED ORDER — RANOLAZINE ER 1000 MG PO TB12: 1000.0000 mg | ORAL_TABLET | Freq: Two times a day (BID) | ORAL | Status: DC

## 2012-10-19 MED ORDER — LISINOPRIL 2.5 MG PO TABS
1.2500 mg | ORAL_TABLET | Freq: Every day | ORAL | Status: DC
  Administered 2012-10-19: 1.25 mg via ORAL
  Filled 2012-10-19: qty 0.5

## 2012-10-19 MED ORDER — IBUPROFEN 600 MG PO TABS: 600.0000 mg | ORAL_TABLET | Freq: Three times a day (TID) | ORAL | Status: DC

## 2012-10-19 MED ORDER — ASPIRIN 81 MG PO TBEC: 81.0000 mg | DELAYED_RELEASE_TABLET | Freq: Every day | ORAL | Status: DC

## 2012-10-19 NOTE — Progress Notes (Signed)
Subjective: No pain, no complaints, tired easily with walking in hall yesterday.  Objective: Vital signs in last 24 hours: Temp:  [98 F (36.7 C)-98.4 F (36.9 C)] 98 F (36.7 C) (12/14 0414) Pulse Rate:  [56-62] 56  (12/14 0414) Resp:  [17-18] 18  (12/14 0414) BP: (98-127)/(51-61) 98/51 mmHg (12/14 0414) SpO2:  [100 %] 100 % (12/14 0414) Weight change:  Last BM Date: 10/17/12 Intake/Output from previous day:+600 12/13 0701 - 12/14 0700 In: 600 [P.O.:600] Out: -  Intake/Output this shift:    PE: General:alert and oriented, MAE, follows commands Heart:S1S2 RRR Lungs:clear without rales or rhonchi Abd:+ BS,soft  non tender Ext:no edema.    Lab Results:  [facility] 10/19/12 0730 10/18/12 0440  WBC 5.0 5.6  HGB 10.3* 9.2*  HCT 32.3* 29.9*  PLT 250 237   BMET  Basename 10/18/12 0847 10/17/12 0522  NA 141 140  K 3.4* 3.6  CL 108 108  CO2 23 24  GLUCOSE 83 109*  BUN 15 19  CREATININE 0.80 0.83  CALCIUM 8.7 8.4    Basename 10/16/12 2038 10/16/12 1629  TROPONINI 1.46* 1.60*    Lab Results  Component Value Date   CHOL 93 10/17/2012   HDL 36* 10/17/2012   LDLCALC 39 10/17/2012   TRIG 90 10/17/2012   CHOLHDL 2.6 10/17/2012   Lab Results  Component Value Date   HGBA1C 6.0* 09/03/2012     Lab Results  Component Value Date   TSH 2.229 09/10/2012    Hepatic Function Panel No results found for this basename: PROT,ALBUMIN,AST,ALT,ALKPHOS,BILITOT,BILIDIR,IBILI in the last 72 hours  Basename 10/17/12 0522  CHOL 93   No results found for this basename: PROTIME in the last 72 hours    EKG: Orders placed during the hospital encounter of 10/16/12  . EKG 12-LEAD  . EKG 12-LEAD  . EKG 12-LEAD  . EKG 12-LEAD  . EKG 12-LEAD  . EKG 12-LEAD  . EKG 12-LEAD  . EKG 12-LEAD  . EKG  . EKG 12-LEAD    Studies/Results: Echo 10/18/12:  - Left ventricle: The cavity size was normal. Wall thickness was increased in a pattern of mild LVH. Systolic function was  mildly to moderately reduced. The estimated ejection fraction was in the range of 40% to 45%. Moderate hypokinesis of the mid-distalanterior and apical myocardium. Severe hypokinesis of the distalinferior myocardium. Features are consistent with a pseudonormal left ventricular filling pattern, with concomitant abnormal relaxation and increased filling pressure (grade 2 diastolic dysfunction). Doppler parameters are consistent with elevated mean left atrial filling pressure. - Ventricular septum: Septal motion showed paradox. These changes are consistent with a post-thoracotomy state. - Left atrium: The atrium was moderately to severely dilated. - Right ventricle: The cavity size was mildly dilated. Systolic function was mildly reduced. - Right atrium: The atrium was moderately dilated.      Medications: I have reviewed the patient's current medications.    Marland Kitchen aspirin EC  81 mg Oral Daily  . atorvastatin  10 mg Oral Daily  . clopidogrel  75 mg Oral Q breakfast  . finasteride  5 mg Oral Daily  . furosemide  20 mg Oral BID  . ibuprofen  600 mg Oral TID WC  . influenza  inactive virus vaccine  0.5 mL Intramuscular Tomorrow-1000  . isosorbide mononitrate  30 mg Oral Daily  . metoprolol  25 mg Oral BID  . pantoprazole  80 mg Oral Daily  . pneumococcal 23 valent vaccine  0.5 mL Intramuscular Tomorrow-1000  .  ranolazine  1,000 mg Oral BID  . terazosin  5 mg Oral QHS   Assessment/Plan: Principal Problem:  *NSTEMI 10/16/12 Active Problems:  CAD, CABG X 09 Nov 2011. re-do CABG X1 09/19/12,   Smoker  HTN (hypertension)  ICM, EF 40% Jan 2013,  50-55% Oct 2013, now 40-45% with apical infarct  Dyslipidemia  BPH (benign prostatic hyperplasia)  PLAN: Per Dr. Tresa Endo "Cines reviewed. ? if small very proximal diagonal is the culprit vessel contributing to recurrent events, not amenable to PCI. Will titrate ranexa to 1000 mg bid. Will check P2Y12 to asses for plavix responsivess and change  to brilinta if not. "  P2Y12 is 234 prior to cath it was 273, please see note with lab.  ? Change to brilinta?  Prior to CABG EF 50-55% at cath now by echo 40-45% with apical infarct and by cath 10/04/12  EF 45-50%  Ambulate, ? Discharge tomorrow.  LOS: 3 days   [name],LAURA R 10/19/2012, 9:02 AM   I have seen and examined the patient along with [facility] R NP.  I have reviewed the chart, notes and new data.  I agree with PNP's note.  Key new complaints: no angina or dyspnea when walking the hall Key examination changes: no signs of CHF and no arrhythmia Key new findings / data: echo shows anteroapical infarction, LVEF   PLAN: I think he has had a stuttering infarction of the distal LAD artery and that the vessel we presumed to be the LAD was truly a distal diagonal artery. The distal LAD appears totally occluded and there is no way to approach its revascularization either or percutaneously or surgically.Hopefully he has completed the infarction. He is tolerating the mild reduction in LVEF well. Angina free on current regimen. Start low dose ACE inh for declining LVEF and plan to gradually titrate up beta blocker and ACEi. These may be limited by low BP. Watch for a few hours after first dose of ACEi.  He may need antidepressant therapy. Reassess as outpatient.  Thurmon Fair, MD, [facility]

## 2012-10-19 NOTE — Progress Notes (Signed)
Assessment unchanged. Discussed D/C instructions with pt. Verbalized instructions. Pt received Rx and samples of Ranexa. IV removed and tele D/C'd. Pt left via W/C accompanied by NT.

## 2012-10-19 NOTE — Progress Notes (Signed)
CARDIAC REHAB PHASE I   PRE:  Rate/Rhythm: 61 SR    BP: sitting 92/56    SaO2:   MODE:  Ambulation: 1000 ft   POST:  Rate/Rhythm: 75 SR    BP: sitting 100/60     SaO2:   Tolerated well. No cp, feels well. Not as tired today. Feels yesterday's fatigue was somewhat a mental/feeling depressed component. Feels better today knowing more of what is going on with his heart. Discussed ntg use/calling 911. Also [facility].  [phone]   Harriet Masson CES, ACSM

## 2012-10-20 ENCOUNTER — Inpatient Hospital Stay (HOSPITAL_COMMUNITY)
Admission: AD | Admit: 2012-10-20 | Discharge: 2012-10-25 | Disposition: A | Discharge status: Home / Self Care | Payer: Non-veteran care | Source: Home / Self Care | Attending: Cardiovascular Disease | Admitting: Cardiovascular Disease

## 2012-10-20 ENCOUNTER — Other Ambulatory Visit: Payer: Self-pay

## 2012-10-20 ENCOUNTER — Encounter (HOSPITAL_COMMUNITY): Payer: Self-pay | Admitting: Cardiology

## 2012-10-20 ENCOUNTER — Emergency Department (HOSPITAL_COMMUNITY): Payer: Non-veteran care

## 2012-10-20 DIAGNOSIS — I10 Essential (primary) hypertension: Secondary | ICD-10-CM | POA: Diagnosis present

## 2012-10-20 DIAGNOSIS — R9431 Abnormal electrocardiogram [ECG] [EKG]: Secondary | ICD-10-CM | POA: Diagnosis present

## 2012-10-20 DIAGNOSIS — Z9189 Other specified personal risk factors, not elsewhere classified: Secondary | ICD-10-CM

## 2012-10-20 DIAGNOSIS — I200 Unstable angina: Secondary | ICD-10-CM

## 2012-10-20 DIAGNOSIS — E7800 Pure hypercholesterolemia, unspecified: Secondary | ICD-10-CM | POA: Diagnosis present

## 2012-10-20 DIAGNOSIS — N400 Enlarged prostate without lower urinary tract symptoms: Secondary | ICD-10-CM | POA: Diagnosis present

## 2012-10-20 DIAGNOSIS — R079 Chest pain, unspecified: Secondary | ICD-10-CM | POA: Diagnosis not present

## 2012-10-20 DIAGNOSIS — E785 Hyperlipidemia, unspecified: Secondary | ICD-10-CM | POA: Diagnosis present

## 2012-10-20 DIAGNOSIS — D649 Anemia, unspecified: Secondary | ICD-10-CM | POA: Diagnosis present

## 2012-10-20 DIAGNOSIS — I214 Non-ST elevation (NSTEMI) myocardial infarction: Secondary | ICD-10-CM | POA: Diagnosis not present

## 2012-10-20 DIAGNOSIS — I2510 Atherosclerotic heart disease of native coronary artery without angina pectoris: Secondary | ICD-10-CM | POA: Diagnosis present

## 2012-10-20 DIAGNOSIS — Z87891 Personal history of nicotine dependence: Secondary | ICD-10-CM

## 2012-10-20 DIAGNOSIS — Z77098 Contact with and (suspected) exposure to other hazardous, chiefly nonmedicinal, chemicals: Secondary | ICD-10-CM

## 2012-10-20 DIAGNOSIS — E876 Hypokalemia: Secondary | ICD-10-CM | POA: Diagnosis not present

## 2012-10-20 DIAGNOSIS — Z951 Presence of aortocoronary bypass graft: Secondary | ICD-10-CM | POA: Diagnosis not present

## 2012-10-20 DIAGNOSIS — I2589 Other forms of chronic ischemic heart disease: Secondary | ICD-10-CM | POA: Diagnosis present

## 2012-10-20 DIAGNOSIS — I25810 Atherosclerosis of coronary artery bypass graft(s) without angina pectoris: Secondary | ICD-10-CM | POA: Diagnosis present

## 2012-10-20 DIAGNOSIS — I252 Old myocardial infarction: Secondary | ICD-10-CM

## 2012-10-20 DIAGNOSIS — F4310 Post-traumatic stress disorder, unspecified: Secondary | ICD-10-CM | POA: Diagnosis present

## 2012-10-20 DIAGNOSIS — I255 Ischemic cardiomyopathy: Secondary | ICD-10-CM | POA: Diagnosis present

## 2012-10-20 LAB — CBC WITH DIFFERENTIAL/PLATELET
Eosinophils Relative: 1 % (ref 0–5)
HCT: 32.7 % — ABNORMAL LOW (ref 39.0–52.0)
Lymphocytes Relative: 20 % (ref 12–46)
Lymphs Abs: 2.1 10*3/uL (ref 0.7–4.0)
MCV: 83.4 fL (ref 78.0–100.0)
Monocytes Absolute: 0.9 10*3/uL (ref 0.1–1.0)
RBC: 3.92 MIL/uL — ABNORMAL LOW (ref 4.22–5.81)
WBC: 10.8 10*3/uL — ABNORMAL HIGH (ref 4.0–10.5)

## 2012-10-20 LAB — POCT I-STAT, CHEM 8
BUN: 19 mg/dL (ref 6–23)
Creatinine, Ser: 0.9 mg/dL (ref 0.50–1.35)
Hemoglobin: 11.6 g/dL — ABNORMAL LOW (ref 13.0–17.0)
Potassium: 3.4 mEq/L — ABNORMAL LOW (ref 3.5–5.1)
Sodium: 141 mEq/L (ref 135–145)

## 2012-10-20 LAB — BASIC METABOLIC PANEL
CO2: 20 mEq/L (ref 19–32)
Chloride: 104 mEq/L (ref 96–112)
Sodium: 137 mEq/L (ref 135–145)

## 2012-10-20 LAB — PROTIME-INR: Prothrombin Time: 14.6 seconds (ref 11.6–15.2)

## 2012-10-20 LAB — POCT I-STAT TROPONIN I

## 2012-10-20 LAB — TROPONIN I: Troponin I: 2.3 ng/mL (ref <0.30)

## 2012-10-20 MED ORDER — MORPHINE SULFATE 2 MG/ML IJ SOLN
INTRAMUSCULAR | Status: AC
Start: 2012-10-20 — End: 2012-10-20
  Administered 2012-10-20: 2 mg via INTRAVENOUS
  Filled 2012-10-20: qty 1

## 2012-10-20 MED ORDER — HYDROCODONE-ACETAMINOPHEN 10-325 MG PO TABS
1.0000 | ORAL_TABLET | Freq: Four times a day (QID) | ORAL | Status: DC | PRN
  Administered 2012-10-20: 1 via ORAL
  Filled 2012-10-20: qty 1

## 2012-10-20 MED ORDER — HEPARIN (PORCINE) IN NACL 100-0.45 UNIT/ML-% IJ SOLN
1400.0000 [IU]/h | INTRAMUSCULAR | Status: DC
  Administered 2012-10-20: 1000 [IU]/h via INTRAVENOUS
  Filled 2012-10-20 (×4): qty 250

## 2012-10-20 MED ORDER — RANOLAZINE ER 500 MG PO TB12
1000.0000 mg | ORAL_TABLET | Freq: Two times a day (BID) | ORAL | Status: DC
  Administered 2012-10-20 – 2012-10-25 (×10): 1000 mg via ORAL
  Filled 2012-10-20 (×11): qty 2

## 2012-10-20 MED ORDER — NITROGLYCERIN 0.4 MG SL SUBL: 0.4000 mg | SUBLINGUAL_TABLET | SUBLINGUAL | Status: DC | PRN

## 2012-10-20 MED ORDER — ASPIRIN 81 MG PO CHEW
324.0000 mg | CHEWABLE_TABLET | Freq: Once | ORAL | Status: AC
  Administered 2012-10-20: 160 mg via ORAL

## 2012-10-20 MED ORDER — MORPHINE SULFATE 2 MG/ML IJ SOLN
2.0000 mg | INTRAMUSCULAR | Status: DC | PRN
  Administered 2012-10-20 (×2): 2 mg via INTRAVENOUS
  Filled 2012-10-20 (×2): qty 1

## 2012-10-20 MED ORDER — NITROGLYCERIN IN D5W 200-5 MCG/ML-% IV SOLN: 3.0000 ug/min | INTRAVENOUS | Status: DC

## 2012-10-20 MED ORDER — PNEUMOCOCCAL VAC POLYVALENT 25 MCG/0.5ML IJ INJ
0.5000 mL | INJECTION | INTRAMUSCULAR | Status: AC
  Filled 2012-10-20: qty 0.5

## 2012-10-20 MED ORDER — SODIUM CHLORIDE 0.9 % IV BOLUS (SEPSIS)
500.0000 mL | Freq: Once | INTRAVENOUS | Status: AC
Start: 2012-10-20 — End: 2012-10-20
  Administered 2012-10-20: 500 mL via INTRAVENOUS

## 2012-10-20 MED ORDER — INFLUENZA VIRUS VACC SPLIT PF IM SUSP
0.5000 mL | INTRAMUSCULAR | Status: AC
  Filled 2012-10-20: qty 0.5

## 2012-10-20 MED ORDER — ATORVASTATIN CALCIUM 10 MG PO TABS
10.0000 mg | ORAL_TABLET | Freq: Every day | ORAL | Status: DC
  Administered 2012-10-20 – 2012-10-24 (×5): 10 mg via ORAL
  Filled 2012-10-20 (×6): qty 1

## 2012-10-20 MED ORDER — TERAZOSIN HCL 5 MG PO CAPS
5.0000 mg | ORAL_CAPSULE | Freq: Every day | ORAL | Status: DC
  Administered 2012-10-20 – 2012-10-23 (×4): 5 mg via ORAL
  Filled 2012-10-20 (×5): qty 1

## 2012-10-20 MED ORDER — CARVEDILOL 3.125 MG PO TABS
3.1250 mg | ORAL_TABLET | Freq: Two times a day (BID) | ORAL | Status: DC
  Administered 2012-10-20 – 2012-10-25 (×10): 3.125 mg via ORAL
  Filled 2012-10-20 (×13): qty 1

## 2012-10-20 MED ORDER — ASPIRIN 300 MG RE SUPP: 300.0000 mg | RECTAL | Status: DC

## 2012-10-20 MED ORDER — ASPIRIN EC 81 MG PO TBEC
81.0000 mg | DELAYED_RELEASE_TABLET | Freq: Every day | ORAL | Status: DC
  Administered 2012-10-21 – 2012-10-25 (×5): 81 mg via ORAL
  Filled 2012-10-20 (×5): qty 1

## 2012-10-20 MED ORDER — MORPHINE SULFATE 4 MG/ML IJ SOLN
INTRAMUSCULAR | Status: AC
  Administered 2012-10-20: 4 mg
  Filled 2012-10-20: qty 1

## 2012-10-20 MED ORDER — BIOTENE DRY MOUTH MT LIQD
15.0000 mL | Freq: Two times a day (BID) | OROMUCOSAL | Status: DC
  Administered 2012-10-20 – 2012-10-25 (×10): 15 mL via OROMUCOSAL

## 2012-10-20 MED ORDER — ONDANSETRON HCL 4 MG/2ML IJ SOLN
4.0000 mg | Freq: Four times a day (QID) | INTRAMUSCULAR | Status: DC | PRN
  Administered 2012-10-20 – 2012-10-21 (×2): 4 mg via INTRAVENOUS
  Filled 2012-10-20 (×4): qty 2

## 2012-10-20 MED ORDER — ASPIRIN 81 MG PO CHEW
CHEWABLE_TABLET | ORAL | Status: AC
  Administered 2012-10-20: 160 mg via ORAL
  Filled 2012-10-20: qty 2

## 2012-10-20 MED ORDER — POTASSIUM CHLORIDE CRYS ER 20 MEQ PO TBCR
40.0000 meq | EXTENDED_RELEASE_TABLET | Freq: Once | ORAL | Status: AC
  Administered 2012-10-20: 40 meq via ORAL
  Filled 2012-10-20: qty 2

## 2012-10-20 MED ORDER — AMLODIPINE BESYLATE 5 MG PO TABS
5.0000 mg | ORAL_TABLET | Freq: Every day | ORAL | Status: DC
  Administered 2012-10-21: 5 mg via ORAL
  Filled 2012-10-20 (×3): qty 1

## 2012-10-20 MED ORDER — NITROGLYCERIN IN D5W 200-5 MCG/ML-% IV SOLN
2.0000 ug/min | INTRAVENOUS | Status: DC
  Administered 2012-10-20: 55 ug/min via INTRAVENOUS
  Administered 2012-10-20: 35 ug/min via INTRAVENOUS
  Administered 2012-10-22: 10 ug/min via INTRAVENOUS
  Filled 2012-10-20: qty 250

## 2012-10-20 MED ORDER — CLOPIDOGREL BISULFATE 75 MG PO TABS
75.0000 mg | ORAL_TABLET | Freq: Every day | ORAL | Status: DC
  Administered 2012-10-21 – 2012-10-25 (×5): 75 mg via ORAL
  Filled 2012-10-20 (×8): qty 1

## 2012-10-20 MED ORDER — NITROGLYCERIN IN D5W 200-5 MCG/ML-% IV SOLN
INTRAVENOUS | Status: AC
  Administered 2012-10-20: 10 ug
  Filled 2012-10-20: qty 250

## 2012-10-20 MED ORDER — ACETAMINOPHEN 325 MG PO TABS: 650.0000 mg | ORAL_TABLET | ORAL | Status: DC | PRN

## 2012-10-20 MED ORDER — HEPARIN BOLUS VIA INFUSION
4000.0000 [IU] | Freq: Once | INTRAVENOUS | Status: AC
  Administered 2012-10-20: 4000 [IU] via INTRAVENOUS

## 2012-10-20 MED ORDER — PANTOPRAZOLE SODIUM 40 MG PO TBEC
40.0000 mg | DELAYED_RELEASE_TABLET | Freq: Every day | ORAL | Status: DC
  Administered 2012-10-20 – 2012-10-25 (×6): 40 mg via ORAL
  Filled 2012-10-20 (×6): qty 1

## 2012-10-20 MED ORDER — ASPIRIN EC 81 MG PO TBEC: 81.0000 mg | DELAYED_RELEASE_TABLET | Freq: Every day | ORAL | Status: DC

## 2012-10-20 MED ORDER — SODIUM CHLORIDE 0.9 % IV SOLN
INTRAVENOUS | Status: DC
  Administered 2012-10-22: 1000 mL via INTRAVENOUS

## 2012-10-20 MED ORDER — FINASTERIDE 5 MG PO TABS
5.0000 mg | ORAL_TABLET | Freq: Every day | ORAL | Status: DC
  Administered 2012-10-21 – 2012-10-25 (×5): 5 mg via ORAL
  Filled 2012-10-20 (×5): qty 1

## 2012-10-20 MED ORDER — ASPIRIN 81 MG PO CHEW: 324.0000 mg | CHEWABLE_TABLET | ORAL | Status: DC

## 2012-10-20 NOTE — ED Notes (Signed)
Nitro up to 50 mcg per [facility] dr

## 2012-10-20 NOTE — ED Notes (Signed)
Nitro up to 30 pt states starting to ease wife at bedside Vernona Rieger at beside

## 2012-10-20 NOTE — Progress Notes (Signed)
ANTICOAGULATION CONSULT NOTE - Initial Consult  Pharmacy Consult for heparin Indication: chest pain/ACS/STEMI  No Known Allergies  Patient Measurements: Height: 5\' 10"  (177.8 cm) Weight: 183 lb 3.2 oz (83.1 kg) IBW/kg (Calculated) : 73  Heparin Dosing Weight: 80.3 kg  Vital Signs: Temp: 98 F (36.7 C) (12/15 2000) Temp src: Oral (12/15 2000) BP: 106/58 mmHg (12/15 2030) Pulse Rate: 63  (12/15 2030)  Labs:  Basename 10/20/12 2044 10/20/12 1456 10/20/12 1346 10/20/12 1335 10/19/12 0730 10/18/12 0847 10/18/12 0440  HGB -- -- 11.6* 10.7* -- -- --  HCT -- -- 34.0* 32.7* 32.3* -- --  PLT -- -- -- 262 250 -- 237  APTT -- -- -- 32 -- -- --  LABPROT -- -- -- 14.6 -- -- --  INR -- -- -- 1.16 -- -- --  HEPARINUNFRC 0.27* -- -- -- -- -- --  CREATININE -- -- 0.90 0.97 -- 0.80 --  CKTOTAL -- -- -- -- -- -- --  CKMB -- -- -- -- -- -- --  TROPONINI -- 0.41* -- -- -- -- --    Estimated Creatinine Clearance: 84.5 ml/min (by C-G formula based on Cr of 0.9).   Medical History: Past Medical History  Diagnosis Date  . Hypertension   . Hypercholesterolemia   . Anginal pain   . DDD (degenerative disc disease), cervical   . Arthritis     "in my back and neck" (2012-09-05)  . Malaria     "I've had it a couple times in the 1960's; almost died from it" (September 05, 2012)  . PTSD (post-traumatic stress disorder)   . NSTEMI (non-ST elevated myocardial infarction) 11/2011    anterior  . NSTEMI (non-ST elevated myocardial infarction) Sep 05, 2012  . Coronary artery disease   . CAD, CABG X 09 Nov 2011. re-do CABG X1 09/19/12 12/04/2011    Medications:  Prescriptions prior to admission  Medication Sig Dispense Refill  . aspirin EC 81 MG EC tablet Take 1 tablet (81 mg total) by mouth daily.      Marland Kitchen atorvastatin (LIPITOR) 10 MG tablet Take 10 mg by mouth daily.      . clopidogrel (PLAVIX) 75 MG tablet Take 1 tablet (75 mg total) by mouth daily with breakfast.  30 tablet  11  . finasteride (PROSCAR) 5  MG tablet Take 5 mg by mouth daily.      . furosemide (LASIX) 20 MG tablet Take 1 tablet (20 mg total) by mouth 2 (two) times daily.  60 tablet  5  . HYDROcodone-acetaminophen (NORCO) 10-325 MG per tablet Take 1 tablet by mouth every 6 (six) hours as needed for pain.  30 tablet  0  . ibuprofen (ADVIL,MOTRIN) 600 MG tablet Take 1 tablet (600 mg total) by mouth 3 (three) times daily with meals.  30 tablet  0  . isosorbide mononitrate (IMDUR) 30 MG 24 hr tablet Take 1 tablet (30 mg total) by mouth daily.  30 tablet  5  . lisinopril (PRINIVIL,ZESTRIL) 2.5 MG tablet Take 0.5 tablets (1.25 mg total) by mouth daily.  30 tablet  1  . metoprolol (LOPRESSOR) 50 MG tablet Take 25 mg by mouth 2 (two) times daily.      . nitroGLYCERIN (NITROSTAT) 0.4 MG SL tablet Place 1 tablet (0.4 mg total) under the tongue every 5 (five) minutes x 3 doses as needed for chest pain.  25 tablet  5  . omeprazole (PRILOSEC) 20 MG capsule Take 20 mg by mouth 2 (two) times daily.      Marland Kitchen  ranolazine (RANEXA) 1000 MG SR tablet Take 1 tablet (1,000 mg total) by mouth 2 (two) times daily.  60 tablet  11  . terazosin (HYTRIN) 5 MG capsule Take 5 mg by mouth at bedtime.        Assessment: 66 y/oM on heparin for chest pain/unstable angina. No bleeding noted; CBC stable. Received 4000 unit bolus. Heparin level slightly subtherapeutic.   Goal of Therapy:  Heparin level 0.3-0.7 units/ml Monitor platelets by anticoagulation protocol: Yes   Plan:  1. Increase heparin to infusion at 1200 units/hr 2. 6 hr heparin level  3. Daily heparin level, CBC  Thank you,  Brett Fairy, PharmD, BCPS 10/20/2012 9:48 PM

## 2012-10-20 NOTE — Discharge Summary (Signed)
Physician Discharge Summary  Patient ID: [name]. MRN: [id number] DOB/AGE: Dec 26, 1945 66 y.o.  Admit date: 10/16/2012 Discharge date: 10/19/2012  Discharge Diagnoses:  Principal Problem:  *NSTEMI 10/16/12 Active Problems:  CAD, CABG X 09 Nov 2011. re-do CABG X1 09/19/12,   Smoker  HTN (hypertension)  ICM, EF 40% Jan 2013,  50-55% Oct 2013, now 40-45% with apical infarct  Dyslipidemia  BPH (benign prostatic hyperplasia)   PROCEDURES: cardiac cath 10/17/12 by Dr. Herbie Baltimore   Discharged Condition: good  Hospital Course: [name] with a history coronary artery disease status post CABG and re-do this year. Rec'd a repeat heart catheterization approximately 2 weeks ago for abnormal EKG and at that time all of his grafts were patent. The patient reports he developed chest pressure and tightness with shortness of breath that began last pm around 2300, he would take NTG and pain would resolve for short period of time but then return. He took pain medication as well as aspirin without relief, with continued returning of pain, he called EMS. EMS gave him nitroglycerin and he states mild improvement in his symptoms but still ongoing chest pain on arrival to the emergency department. EKG obtained by EMS demonstrates significant inferior lateral ST depression and T-wave inversions concerning for ischemia.  With morphine pain resolved on IV NTG and IV Heparin.  Admitted to Stepdown, and troponin I and ckmb were positive for MI.  Thought to place him on integrelin but due to recent CABG he was unable to receive Integrelin.  Pt underwent cardiac cath the next AM revealing No obvious culprit lesion, no new stenosis to explain resting Angina & NSTEMI.  Jeopardized proximal LAD Septal perforator could explain dynamic Septal TWI on ECG - but this is not an acute finding and is not approachable. Per Dr. Tresa Endo "Cines reviewed. ? if small very proximal diagonal is the culprit vessel contributing to recurrent  events, not amenable to PCI.    Mildly elevated LVEDP post-hydration.  Ranexa was added to Imdur for angina.  Echo was done with decrease of EF to 40-45% and apical infarct.  Pt progressed well and by 10/19/12 he had no further pain, ACE was added to meds and pt ambulated without complications.  It was noted prior to cath P2Y12 was 273, discussed possibility of changing Plavix to Brilinta but pt preferred to stay with Plavix and follow up P2Y12 was 234.  Also prior to discharge we realized his Ranexa would cost the pt. $300.00 per month through Medicare, He has a prescription to take to [place], but in the mean time he was given 8 weeks worth of Ranexa samples.  He was seen and evaluated by Dr. Royann Shivers and  Was found stable for discharge.   Consults: None  Significant Diagnostic Studies:  BMET    Component Value Date/Time   NA 141 10/18/2012 0847   K 3.4* 10/18/2012 0847   CL 108 10/18/2012 0847   CO2 23 10/18/2012 0847   GLUCOSE 83 10/18/2012 0847   BUN 15 10/18/2012 0847   CREATININE 0.80 10/18/2012 0847   CALCIUM 8.7 10/18/2012 0847   GFRNONAA >90 10/18/2012 0847   GFRAA >90 10/18/2012 0847    CBC    Component Value Date/Time   WBC 5.0 10/19/2012 0730   RBC 3.82* 10/19/2012 0730   HGB 10.3* 10/19/2012 0730   HCT 32.3* 10/19/2012 0730   PLT 250 10/19/2012 0730   MCV 84.6 10/19/2012 0730   MCH 27.0 10/19/2012 0730   MCHC 31.9  10/19/2012 0730   RDW 15.2 10/19/2012 0730   LYMPHSABS 1.2 10/16/2012 0502   MONOABS 0.6 10/16/2012 0502   EOSABS 0.0 10/16/2012 0502   BASOSABS 0.0 10/16/2012 0502    Pk troponin 1.61  2D Echo 10/18/12: Left ventricle: The cavity size was normal. Wall thickness was increased in a pattern of mild LVH. Systolic function was mildly to moderately reduced. The estimated ejection fraction was in the range of 40% to 45%. Moderate hypokinesis of the mid-distalanterior and apical myocardium. Severe hypokinesis of the distalinferior myocardium. Features  are consistent with a pseudonormal left ventricular filling pattern, with concomitant abnormal relaxation and increased filling pressure (grade 2 diastolic dysfunction). Doppler parameters are consistent with elevated mean left atrial filling pressure. - Ventricular septum: Septal motion showed paradox. These changes are consistent with a post-thoracotomy state. - Left atrium: The atrium was moderately to severely dilated. - Right ventricle: The cavity size was mildly dilated. Systolic function was mildly reduced. - Right atrium: The atrium was moderately dilated.  PORTABLE CHEST - 1 VIEW 10/16/12 Comparison: 10/14/2012  Findings: Stable appearance of postoperative changes in the  mediastinum. Shallow inspiration. Borderline heart size with  normal pulmonary vascularity. Suggestion of mild atelectasis in  the right lung base. No focal consolidation. No blunting of  costophrenic angles. No pneumothorax. Mediastinal contours appear  intact.  IMPRESSION:  Shallow inspiration with atelectasis in the right lung base.   Discharge Exam: Blood pressure 134/73, pulse 61, temperature 97.3 F (36.3 C), temperature source Oral, resp. rate 18, height 5\' 10"  (1.778 m), weight 80.5 kg (177 lb 7.5 oz), SpO2 100.00%.   Exam AM of discharge:PE: General:alert and oriented, MAE, follows commands  Heart:S1S2 RRR  Lungs:clear without rales or rhonchi  Abd:+ BS,soft non tender  Ext:no edema  Disposition: 01-Home or Self Care  Discharge Orders    Future Appointments: Provider: Department: Dept Phone: Center:   11/12/2012 12:30 PM Alleen Borne, MD [facility] [phone] [facility]       Medication List     As of 10/20/2012 12:20 PM    TAKE these medications         aspirin 81 MG EC tablet   Take 1 tablet (81 mg total) by mouth daily.      atorvastatin 10 MG tablet   Commonly known as: LIPITOR   Take 10 mg by mouth daily.      clopidogrel 75 MG  tablet   Commonly known as: PLAVIX   Take 1 tablet (75 mg total) by mouth daily with breakfast.      finasteride 5 MG tablet   Commonly known as: PROSCAR   Take 5 mg by mouth daily.      furosemide 20 MG tablet   Commonly known as: LASIX   Take 1 tablet (20 mg total) by mouth 2 (two) times daily.      HYDROcodone-acetaminophen 10-325 MG per tablet   Commonly known as: NORCO   Take 1 tablet by mouth every 6 (six) hours as needed for pain.      ibuprofen 600 MG tablet   Commonly known as: ADVIL,MOTRIN   Take 1 tablet (600 mg total) by mouth 3 (three) times daily with meals.      isosorbide mononitrate 30 MG 24 hr tablet   Commonly known as: IMDUR   Take 1 tablet (30 mg total) by mouth daily.      lisinopril 2.5 MG tablet   Commonly known as: PRINIVIL,ZESTRIL   Take  0.5 tablets (1.25 mg total) by mouth daily.      metoprolol 50 MG tablet   Commonly known as: LOPRESSOR   Take 25 mg by mouth 2 (two) times daily.      nitroGLYCERIN 0.4 MG SL tablet   Commonly known as: NITROSTAT   Place 1 tablet (0.4 mg total) under the tongue every 5 (five) minutes x 3 doses as needed for chest pain.      omeprazole 20 MG capsule   Commonly known as: PRILOSEC   Take 20 mg by mouth 2 (two) times daily.      ranolazine 1000 MG SR tablet   Commonly known as: RANEXA   Take 1 tablet (1,000 mg total) by mouth 2 (two) times daily.      terazosin 5 MG capsule   Commonly known as: HYTRIN   Take 5 mg by mouth at bedtime.           Follow-up Information    Follow up with Runell Gess, MD. (the office will call with date and time- appointment with either Dr. Allyson Sabal or one of his NP, PAs)    Contact information:   [address] [place] [address] [phone]         DISCHARGE INSTRUCTIONS: Call [facility] if any bleeding, swelling or drainage at cath site.  May shower, no tub baths for 48 hours for groin sticks.   No driving for at  least 1 week and according to Dr. Sharee Pimple instructions.  Heart Healthy diet   Signed: [name],LAURA R 10/20/2012, 12:20 PM

## 2012-10-20 NOTE — ED Provider Notes (Signed)
History     CSN: [id number]  Arrival date & time 10/20/12  1337   First MD Initiated Contact with Patient 10/20/12 1330      Chief Complaint  Patient presents with  . Code STEMI     Patient is a 66 y.o. male presenting with chest pain. The history is provided by the patient.  Chest Pain Episode onset: this morning. Chest pain occurs constantly. The chest pain is worsening. The pain is associated with exertion. The severity of the pain is severe. The quality of the pain is described as pressure-like. The pain does not radiate. Chest pain is worsened by exertion. Primary symptoms include shortness of breath, nausea and dizziness. Pertinent negatives for primary symptoms include no fever.  Dizziness also occurs with nausea and weakness.   Associated symptoms include near-syncope and weakness. He tried nitroglycerin and aspirin for the symptoms.     Past Medical History  Diagnosis Date  . Hypertension   . Hypercholesterolemia   . Anginal pain   . DDD (degenerative disc disease), cervical   . Arthritis     "in my back and neck" (08-31-12)  . Malaria     "I've had it a couple times in the 1960's; almost died from it" (Aug 31, 2012)  . PTSD (post-traumatic stress disorder)   . NSTEMI (non-ST elevated myocardial infarction) 11/2011    anterior  . NSTEMI (non-ST elevated myocardial infarction) 31-Aug-2012  . Coronary artery disease   . CAD, CABG X 09 Nov 2011. re-do CABG X1 09/19/12 12/04/2011    Past Surgical History  Procedure Date  . Back surgery   . Lumbar disc surgery 1980's?  . Tonsillectomy     "as a kid" (08/31/12)  . Coronary artery bypass graft 12/02/2011    Procedure: CORONARY ARTERY BYPASS GRAFTING (CABG);  Surgeon: Alleen Borne, MD;  Location: [facility] OR;  Service: Open Heart Surgery;  Laterality: N/A;  . Cardiac catheterization 11/2011  . Cataract extraction w/ intraocular lens  implant, bilateral 1990's  . Coronary artery bypass graft 09/19/2012    Procedure: REDO  CORONARY ARTERY BYPASS GRAFTING (CABG);  Surgeon: Alleen Borne, MD;  Location: [facility] OR;  Service: Open Heart Surgery;  Laterality: N/A;    No family history on file.  History  Substance Use Topics  . Smoking status: Former Smoker -- 0.5 packs/day for 50 years    Types: Cigarettes    Quit date: 12/02/2011  . Smokeless tobacco: Never Used  . Alcohol Use: Yes     Comment: August 31, 2012 "haven't had a drink in > 25 years; never had problem w/it"      Review of Systems  Constitutional: Negative for fever.  Respiratory: Positive for shortness of breath.   Cardiovascular: Positive for chest pain and near-syncope.  Gastrointestinal: Positive for nausea. Negative for blood in stool.  Musculoskeletal: Negative for back pain.  Skin: Negative for color change.  Neurological: Positive for dizziness and weakness.  Psychiatric/Behavioral: Negative for agitation.  All other systems reviewed and are negative.    Allergies  Review of patient's allergies indicates no known allergies.  Home Medications   Current Outpatient Rx  Name  Route  Sig  Dispense  Refill  . ASPIRIN 81 MG PO TBEC   Oral   Take 1 tablet (81 mg total) by mouth daily.         . ATORVASTATIN CALCIUM 10 MG PO TABS   Oral   Take 10 mg by mouth daily.         Marland Kitchen  CLOPIDOGREL BISULFATE 75 MG PO TABS   Oral   Take 1 tablet (75 mg total) by mouth daily with breakfast.   30 tablet   11   . FINASTERIDE 5 MG PO TABS   Oral   Take 5 mg by mouth daily.         . FUROSEMIDE 20 MG PO TABS   Oral   Take 1 tablet (20 mg total) by mouth 2 (two) times daily.   60 tablet   5   . HYDROCODONE-ACETAMINOPHEN 10-325 MG PO TABS   Oral   Take 1 tablet by mouth every 6 (six) hours as needed for pain.   30 tablet   0   . IBUPROFEN 600 MG PO TABS   Oral   Take 1 tablet (600 mg total) by mouth 3 (three) times daily with meals.   30 tablet   0     Stop 11/30/11   . ISOSORBIDE MONONITRATE ER 30 MG PO TB24   Oral   Take 1  tablet (30 mg total) by mouth daily.   30 tablet   5   . LISINOPRIL 2.5 MG PO TABS   Oral   Take 0.5 tablets (1.25 mg total) by mouth daily.   30 tablet   1   . METOPROLOL TARTRATE 50 MG PO TABS   Oral   Take 25 mg by mouth 2 (two) times daily.         Marland Kitchen NITROGLYCERIN 0.4 MG SL SUBL   Sublingual   Place 1 tablet (0.4 mg total) under the tongue every 5 (five) minutes x 3 doses as needed for chest pain.   25 tablet   5   . OMEPRAZOLE 20 MG PO CPDR   Oral   Take 20 mg by mouth 2 (two) times daily.         Marland Kitchen RANOLAZINE ER 1000 MG PO TB12   Oral   Take 1 tablet (1,000 mg total) by mouth 2 (two) times daily.   60 tablet   11   . TERAZOSIN HCL 5 MG PO CAPS   Oral   Take 5 mg by mouth at bedtime.           Pulse 78  Resp 24  Ht 5\' 10"  (1.778 m)  Wt 177 lb (80.287 kg)  BMI 25.40 kg/m2  SpO2 100%   Physical Exam CONSTITUTIONAL: Well developed/well nourished, ill appearing HEAD AND FACE: Normocephalic/atraumatic EYES: EOMI/PERRL ENMT: Mucous membranes moist NECK: supple no meningeal signs CV: S1/S2 noted, no murmurs/rubs/gallops noted LUNGS: Lungs are clear to auscultation bilaterally, no apparent distress ABDOMEN: soft, nontender, no rebound or guarding NEURO: Pt is awake/alert, moves all extremitiesx4 EXTREMITIES: pulses normal, full ROM SKIN: warm, color normal PSYCH:anxious  ED Course  Procedures   CRITICAL CARE Performed by: Joya Gaskins   Total critical care time: 33  Critical care time was exclusive of separately billable procedures and treating other patients.  Critical care was necessary to treat or prevent imminent or life-threatening deterioration.  Critical care was time spent personally by me on the following activities: development of treatment plan with patient and/or surrogate as well as nursing, discussions with consultants, evaluation of patient's response to treatment, examination of patient, obtaining history from patient or  surrogate, ordering and performing treatments and interventions, ordering and review of laboratory studies, ordering and review of radiographic studies, pulse oximetry and re-evaluation of patient's condition.    Labs Reviewed  BASIC METABOLIC PANEL  CBC WITH DIFFERENTIAL  PROTIME-INR  APTT   Pt seen on arrival for concern for STEMI D/w dr Kirke Corin at [phone], he will review EKG and recent cath reports (he had recent cath and admission by [facility]) D/w dr Benedict Needy at 1344, he knows patient and will see him in 20 minutes Chart reveals recent cardiac cath that did not show culprit lesion Awaiting call back from STEMI physician 1:52 PM D/w dr Kirke Corin, he is awaiting to review EKG 1:54 PM D/w dr Kirke Corin, will not activate cath lab, he does not feel this is a STEMI 2:33 PM Pt to be admitted to [facility] cardiology.  He has been placed on NTG and heparin drip for unstable angina/EKG changes Cardiology to admit patient  MDM  Nursing notes including past medical history and social history reviewed and considered in documentation Previous records reviewed and considered - recent cardiac cath noted xrays reviewed and considered Labs/vital reviewed and considered          Date: 10/20/2012  Rate: 79  Rhythm: normal sinus rhythm  QRS Axis: normal  Intervals: normal  ST/T Wave abnormalities: ST elevations anteriorly, ST depressions inferiorly and ST depressions laterally  Conduction Disutrbances:none  Narrative Interpretation:   Old EKG Reviewed: changes noted    Joya Gaskins, MD 10/20/12 1437

## 2012-10-20 NOTE — Progress Notes (Signed)
ANTICOAGULATION CONSULT NOTE - Initial Consult  Pharmacy Consult for heparin Indication: chest pain/ACS/STEMI  No Known Allergies  Patient Measurements: Height: 5\' 10"  (177.8 cm) Weight: 177 lb (80.287 kg) IBW/kg (Calculated) : 73  Heparin Dosing Weight: 80.3 kg  Vital Signs: BP: 112/66 mmHg (12/15 1430) Pulse Rate: 72  (12/15 1430)  Labs:  Basename 10/20/12 1346 10/20/12 1335 10/19/12 0730 10/18/12 0847 10/18/12 0440  HGB 11.6* 10.7* -- -- --  HCT 34.0* 32.7* 32.3* -- --  PLT -- 262 250 -- 237  APTT -- 32 -- -- --  LABPROT -- 14.6 -- -- --  INR -- 1.16 -- -- --  HEPARINUNFRC -- -- -- -- --  CREATININE 0.90 0.97 -- 0.80 --  CKTOTAL -- -- -- -- --  CKMB -- -- -- -- --  TROPONINI -- -- -- -- --    Estimated Creatinine Clearance: 84.5 ml/min (by C-G formula based on Cr of 0.9).   Medical History: Past Medical History  Diagnosis Date  . Hypertension   . Hypercholesterolemia   . Anginal pain   . DDD (degenerative disc disease), cervical   . Arthritis     "in my back and neck" (09-15-12)  . Malaria     "I've had it a couple times in the 1960's; almost died from it" (09-15-2012)  . PTSD (post-traumatic stress disorder)   . NSTEMI (non-ST elevated myocardial infarction) 11/2011    anterior  . NSTEMI (non-ST elevated myocardial infarction) 09/15/2012  . Coronary artery disease   . CAD, CABG X 09 Nov 2011. re-do CABG X1 09/19/12 12/04/2011    Medications:   (Not in a hospital admission)  Assessment: 66 y/oM on heparin for chest pain/unstable angina.  Heparin started per MD, now pharmacy consulted to manage. No bleeding noted; CBC stable. Received 4000 unit bolus.    Goal of Therapy:  Heparin level 0.3-0.7 units/ml Monitor platelets by anticoagulation protocol: Yes   Plan:  1. Heparin infusion at 950 units/hr 2. 6 hr heparin level  3. Daily heparin level, CBC    Doris Cheadle, PharmD Clinical Pharmacist Pager: [phone] Phone:  [phone] 10/20/2012 3:03 PM

## 2012-10-20 NOTE — ED Notes (Signed)
Dr Bebe Shaggy at bedside pt placed on monitor, o2 at 2  and Zoll 2 ivs

## 2012-10-20 NOTE — ED Notes (Signed)
[name] w/ [facility] ordering morphine which was given

## 2012-10-20 NOTE — ED Notes (Signed)
Cp that started this am dropped off by wife states had bypass  on and [place] is his cards

## 2012-10-20 NOTE — H&P (Signed)
[name]. is an 66 y.o. male.    Cardiologist: Dr. Allyson Sabal  [facility]: [facility]   Chief Complaint: severe chest pain 15/10  HPI: 66 year old white married male with history of coronary artery disease with bypass grafting in January of 2013 and redo bypass grafting 09/19/2012.  Since discharge from his redo bypass grafting patient has had 2 admissions with cardiac catheterizations. Both of these admissions were with non-ST elevation MI.  He was discharged yesterday 10/19/2012 on increased medication regimen of Ranexa twice a day, Imdur 60 daily and lisinopril 1.25 twice a day.    Cardiac catheterization 10/04/2012 revealed a patent LIMA to the LAD vein graft to the distal RCA Was patent there was some retrograde flow up what appears to be the native RCA. The vein graft to OM 1 was widely patent graft to extensive circumflex distribution. Peak troponin at that time was 0.48.  Patient was readmitted 10/16/2012 with abnormal EKG with lateral and inferior T wave depression that was significant.  Troponin peaked at  1.61 and cardiac catheterization 10/17/2012 revealed no obvious culprit lesion new stenosis jeopardized proximal LAD septal perforator could explain dynamic septal T wave inversions on EKG but was not acute finding nor was it approachable.  2-D echo was done with EF now 40-45% and appearance of apical infarct.  Patient left the hospital around 7 PM last evening.  He had had no pain for 2 days was able to ambulate in the hall without complications.   Patient is here in the emergency room now with severe midsternal chest pain rated 15/10, there is radiation to jaws.  He has mild nausea no vomiting no diaphoresis and no awareness of shortness of breath but respiration are 30.   Pain began around 0400 and would improve with sl NTG and hydrocodone.  But each time it would return it was more severe.  EKG has ST elevation in aVR and V1 that is new from previous EKGs additionally has significant  T-wave inversions in his inferior lateral leads.  Patient has been started on IV nitroglycerin currently at 50 mics per minute and has had a total of 10 mg of IV morphine. IV heparin is being hung now.  Will admit for further management.  Patient was not taken to the Cath Lab emergently even with ST elevation secondary to frequent caths and now patient has improvement in his EKG.  The pain he has presented with the last 3 times now is more significant than the chest pain he had prior to his redo bypass grafting.  This appears to be coronary spasm.     Past Medical History  Diagnosis Date  . Hypertension   . Hypercholesterolemia   . Anginal pain   . DDD (degenerative disc disease), cervical   . Arthritis     "in my back and neck" (09-15-12)  . Malaria     "I've had it a couple times in the 1960's; almost died from it" (2012/09/15)  . PTSD (post-traumatic stress disorder)   . NSTEMI (non-ST elevated myocardial infarction) 11/2011    anterior  . NSTEMI (non-ST elevated myocardial infarction) 09/15/12  . Coronary artery disease   . CAD, CABG X 09 Nov 2011. re-do CABG X1 09/19/12 12/04/2011    Past Surgical History  Procedure Date  . Back surgery   . Lumbar disc surgery 1980's?  . Tonsillectomy     "as a kid" (15-Sep-2012)  . Coronary artery bypass graft 12/02/2011    Procedure: CORONARY ARTERY  BYPASS GRAFTING (CABG);  Surgeon: Alleen Borne, MD;  Location: [facility] OR;  Service: Open Heart Surgery;  Laterality: N/A;  . Cardiac catheterization 11/2011  . Cataract extraction w/ intraocular lens  implant, bilateral 1990's  . Coronary artery bypass graft 09/19/2012    Procedure: REDO CORONARY ARTERY BYPASS GRAFTING (CABG);  Surgeon: Alleen Borne, MD;  Location: [facility] OR;  Service: Open Heart Surgery;  Laterality: N/A;    History reviewed. No pertinent family history. not significant to this admission Social History:  reports that he quit smoking about 10 months ago. His smoking use included  Cigarettes. He has a 25 pack-year smoking history. He has never used smokeless tobacco. He reports that he drinks alcohol. He reports that he does not use illicit drugs. married his wife is with him in the emergency room.  Allergies: No Known Allergies  Outpatient Medications:  Results for orders placed during the hospital encounter of 10/20/12 (from the past 48 hour(s))  BASIC METABOLIC PANEL     Status: Abnormal   Collection Time   10/20/12  1:35 PM      Component Value Range Comment   Sodium 137  135 - 145 mEq/L    Potassium 3.3 (*) 3.5 - 5.1 mEq/L    Chloride 104  96 - 112 mEq/L    CO2 20  19 - 32 mEq/L    Glucose, Bld 104 (*) 70 - 99 mg/dL    BUN 20  6 - 23 mg/dL    Creatinine, Ser 1.61  0.50 - 1.35 mg/dL    Calcium 9.3  8.4 - 09.6 mg/dL    GFR calc non Af Amer 85 (*) >90 mL/min    GFR calc Af Amer >90  >90 mL/min   CBC WITH DIFFERENTIAL     Status: Abnormal   Collection Time   10/20/12  1:35 PM      Component Value Range Comment   WBC 10.8 (*) 4.0 - 10.5 K/uL    RBC 3.92 (*) 4.22 - 5.81 MIL/uL    Hemoglobin 10.7 (*) 13.0 - 17.0 g/dL    HCT 04.5 (*) 40.9 - 52.0 %    MCV 83.4  78.0 - 100.0 fL    MCH 27.3  26.0 - 34.0 pg    MCHC 32.7  30.0 - 36.0 g/dL    RDW 81.1 (*) 91.4 - 15.5 %    Platelets 262  150 - 400 K/uL    Neutrophils Relative 71  43 - 77 %    Neutro Abs 7.7  1.7 - 7.7 K/uL    Lymphocytes Relative 20  12 - 46 %    Lymphs Abs 2.1  0.7 - 4.0 K/uL    Monocytes Relative 9  3 - 12 %    Monocytes Absolute 0.9  0.1 - 1.0 K/uL    Eosinophils Relative 1  0 - 5 %    Eosinophils Absolute 0.1  0.0 - 0.7 K/uL    Basophils Relative 0  0 - 1 %    Basophils Absolute 0.0  0.0 - 0.1 K/uL   PROTIME-INR     Status: Normal   Collection Time   10/20/12  1:35 PM      Component Value Range Comment   Prothrombin Time 14.6  11.6 - 15.2 seconds    INR 1.16  0.00 - 1.49   APTT     Status: Normal   Collection Time   10/20/12  1:35 PM      Component  Value Range Comment   aPTT 32   24 - 37 seconds   POCT I-STAT TROPONIN I     Status: Normal   Collection Time   10/20/12  1:44 PM      Component Value Range Comment   Troponin i, poc 0.02  0.00 - 0.08 ng/mL    Comment 3            POCT I-STAT, CHEM 8     Status: Abnormal   Collection Time   10/20/12  1:46 PM      Component Value Range Comment   Sodium 141  135 - 145 mEq/L    Potassium 3.4 (*) 3.5 - 5.1 mEq/L    Chloride 107  96 - 112 mEq/L    BUN 19  6 - 23 mg/dL    Creatinine, Ser 1.61  0.50 - 1.35 mg/dL    Glucose, Bld 096 (*) 70 - 99 mg/dL    Calcium, Ion 0.45  4.09 - 1.30 mmol/L    TCO2 19  0 - 100 mmol/L    Hemoglobin 11.6 (*) 13.0 - 17.0 g/dL    HCT 81.1 (*) 91.4 - 52.0 %    Dg Chest Port 1 View  10/20/2012  *RADIOLOGY REPORT*  Clinical Data: Chest pain.  Coronary artery disease.  PORTABLE CHEST - 1 VIEW  Comparison: Chest x-ray dated 10/16/2012 and 10/14/2012  Findings: Heart size and vascularity are normal considering the AP portable technique.  Lungs are clear.  No effusions.  No acute osseous abnormality.  IMPRESSION: No acute abnormality.   Original Report Authenticated By: Francene Boyers, M.D.     ROS: General:no colds or fevers, no weight changes Skin:no rashes or ulcers HEENT:no blurred vision, no congestion CV:see HPI PUL:see HPI GI:no diarrhea constipation or melena, no indigestion GU:no hematuria, no dysuria MS:no joint pain, no claudication Neuro:no syncope, no lightheadedness Endo:no diabetes, no thyroid disease   Blood pressure 112/66, pulse 72, resp. rate 16, height 5\' 10"  (1.778 m), weight 80.287 kg (177 lb), SpO2 100.00%. PE: General:alert and oriented, BP 138/70, severe chest pain on initial assessment, color pale, tearful Skin:warm and dry brisk capillary refill HEENT:normocephalic, sclera clear Neck:supple, mild JVD, no bruits Heart:S1S2 RRR,  No obvious murmur, gallup or rub Lungs:clear without rales rhonchi or wheezes  NWG:[id number] BS, soft, non tender Ext:no edema 2+ pedal  pulses Neuro:alert and oriented, MAE, follows commands    Assessment/Plan Principal Problem:  *Acute coronary syndrome  Active Problems:  S/P CABG x 3: LIMA TO LAD, SVG TO OM, SVG TO RCA., VG to OM occluded, jeopardized LCX, by cath 08/29/12  CAD, CABG X 09 Nov 2011. re-do CABG X1 09/19/12,   Unstable angina, on appropriate medical therapy, re-do CABG 09/19/12 X1 with SVG-OM 09/19/12  Abnormal EKG, marked diffuse downsloping ST depression,ST elevation AVR and V1, 10/20/12  HTN (hypertension)  ICM, EF 40% Jan 2013,  50-55% Oct 2013, now 40-45% with apical infarct  Dyslipidemia  BPH (benign prostatic hyperplasia)  Anemia  PLAN: Severe unstable angina, with acute EKG changes.  3rd episode since re-do CABG.  EKG improved after treatment.  IV heparin, IV NTG , serial cardiac  Enzymes.  Medication changes per Dr. Royann Shivers.  Pain almost resolved.  EKG with improved t waves mild st elevation continues.  [name],LAURA R 10/20/2012, 2:58 PM   I have seen and examined the patient along with [facility] R, NP.  I have reviewed the chart, notes and new data.  I agree with NP's note.  3rd episode with acute coronary sd in last 30 days: similar presentation with severe angina, marked ST segment changes (ST elevation in V1 and aVR and severe ST depression in the inferior and lateral leads) in this patient with total left main coronary occlusion,  s/p CABG in January (LIMA to LAD and SVG to RCA and SVG to LCX) and redo SVG to LCX-OM in November, documented by repeat angiography to have patent grafts (redo CABG 11/07, cath for NSTEMI 11/29, cath for NSTEMI 12/11). Last echo showed findings c/w distal LAD territory ischemia/infarction. His symptoms and ECG changes resolved quickly after high dose IV NTG. He is on statin, ASA, clopidogrel, metoprolol, ranolazine, long acting nitrates and very low dose ACEi (as BP permits). He reportedly did not tolerate ticagrelor (dyspnea). He is a former  smoker.  PLAN: Admit to ICU. Expect he will "rule in" again. Keep on IV NTG and add heparin. Add amlodipine, dc metoprolol and start carvedilol for presumed LIMA spasm - why this should begin to be a problem only after redo CABG and why it would be so severe as to cause infarction remains unexplained. Cath deferred based on results of two recent angiograms.  Thurmon Fair, MD, [facility] [phone] 10/20/2012, 3:10 PM

## 2012-10-20 NOTE — ED Notes (Signed)
Has had 2 baby 3 nitro and 2 pain

## 2012-10-20 NOTE — ED Notes (Signed)
Pt got 2 baby asa as he had taken 2 before coming to er

## 2012-10-20 NOTE — ED Notes (Signed)
Pt c/o left jaw pain

## 2012-10-20 NOTE — ED Notes (Signed)
Vernona Rieger w/ [place] here

## 2012-10-20 NOTE — Progress Notes (Signed)
MD paged in regards to pt c/o chest pain, and no UOP on last shift. MD notified of pt situation, IV fluid and morphine order placed. Nitro started back at 25 mcg. Pt VSS, will continue to monitor.

## 2012-10-20 NOTE — ED Notes (Signed)
Nitro up to 20 or 6 cc

## 2012-10-20 NOTE — ED Notes (Signed)
Nitro drip started at 10 mcg 3 cc per laura w/ [employer]

## 2012-10-21 LAB — HEPARIN LEVEL (UNFRACTIONATED)
Heparin Unfractionated: 0.22 IU/mL — ABNORMAL LOW (ref 0.30–0.70)
Heparin Unfractionated: 0.45 IU/mL (ref 0.30–0.70)

## 2012-10-21 LAB — CBC
HCT: 27.7 % — ABNORMAL LOW (ref 39.0–52.0)
Hemoglobin: 8.8 g/dL — ABNORMAL LOW (ref 13.0–17.0)
MCH: 26.7 pg (ref 26.0–34.0)
MCHC: 31.8 g/dL (ref 30.0–36.0)
MCV: 83.9 fL (ref 78.0–100.0)
RBC: 3.3 MIL/uL — ABNORMAL LOW (ref 4.22–5.81)

## 2012-10-21 LAB — BASIC METABOLIC PANEL
BUN: 16 mg/dL (ref 6–23)
CO2: 21 mEq/L (ref 19–32)
Chloride: 108 mEq/L (ref 96–112)
Glucose, Bld: 93 mg/dL (ref 70–99)
Potassium: 3.4 mEq/L — ABNORMAL LOW (ref 3.5–5.1)
Sodium: 139 mEq/L (ref 135–145)

## 2012-10-21 LAB — TROPONIN I
Troponin I: 5.22 ng/mL (ref <0.30)
Troponin I: 4.88 ng/mL (ref <0.30)
Troponin I: 4.03 ng/mL (ref <0.30)

## 2012-10-21 LAB — LIPID PANEL
HDL: 36 mg/dL — ABNORMAL LOW (ref >39)
LDL Cholesterol: 52 mg/dL (ref 0–99)
Total CHOL/HDL Ratio: 2.8 RATIO
Triglycerides: 71 mg/dL (ref <150)

## 2012-10-21 LAB — CK TOTAL AND CKMB (NOT AT ARMC)
CK, MB: 14.2 ng/mL (ref 0.3–4.0)
Total CK: 137 U/L (ref 7–232)

## 2012-10-21 LAB — IRON AND TIBC
Iron: 15 ug/dL — ABNORMAL LOW (ref 42–135)
Saturation Ratios: 4 % — ABNORMAL LOW (ref 20–55)
TIBC: 338 ug/dL (ref 215–435)

## 2012-10-21 LAB — RETICULOCYTES: Retic Ct Pct: 1.9 % (ref 0.4–3.1)

## 2012-10-21 LAB — VITAMIN B12: Vitamin B-12: 475 pg/mL (ref 211–911)

## 2012-10-21 LAB — FOLATE: Folate: 11.4 ng/mL

## 2012-10-21 MED ORDER — ALPRAZOLAM 0.25 MG PO TABS: 0.2500 mg | ORAL_TABLET | Freq: Three times a day (TID) | ORAL | Status: DC | PRN

## 2012-10-21 MED ORDER — ALPRAZOLAM 0.5 MG PO TABS
0.5000 mg | ORAL_TABLET | Freq: Once | ORAL | Status: AC
  Administered 2012-10-21: 0.5 mg via ORAL
  Filled 2012-10-21: qty 1

## 2012-10-21 MED ORDER — SALINE SPRAY 0.65 % NA SOLN
1.0000 | NASAL | Status: DC | PRN
  Filled 2012-10-21: qty 44

## 2012-10-21 MED ORDER — POTASSIUM CHLORIDE CRYS ER 20 MEQ PO TBCR
40.0000 meq | EXTENDED_RELEASE_TABLET | Freq: Once | ORAL | Status: AC
  Administered 2012-10-21: 40 meq via ORAL
  Filled 2012-10-21: qty 2

## 2012-10-21 MED ORDER — HEPARIN BOLUS VIA INFUSION
1000.0000 [IU] | Freq: Once | INTRAVENOUS | Status: AC
  Administered 2012-10-21: 1000 [IU] via INTRAVENOUS
  Filled 2012-10-21: qty 1000

## 2012-10-21 NOTE — Progress Notes (Signed)
ANTICOAGULATION CONSULT NOTE  - Follow UP  Pharmacy Consult for heparin Indication: chest pain/ACS/STEMI  No Known Allergies  Patient Measurements: Height: 5\' 10"  (177.8 cm) Weight: 186 lb 1.1 oz (84.4 kg) IBW/kg (Calculated) : 73  Heparin Dosing Weight: 80.3 kg  Vital Signs: Temp: 98 F (36.7 C) (12/16 1600) Temp src: Oral (12/16 1600) BP: 91/48 mmHg (12/16 1500) Pulse Rate: 60  (12/16 1600)  Labs:  Basename 10/21/12 1611 10/21/12 1610 10/21/12 0940 10/21/12 0936 10/21/12 0221 10/20/12 1346 10/20/12 1335 10/19/12 0730  HGB -- 8.8* -- -- 8.8* -- -- --  HCT -- 28.4* -- -- 27.7* 34.0* -- --  PLT -- -- -- -- 220 -- 262 250  APTT -- -- -- -- -- -- 32 --  LABPROT -- -- -- -- -- -- 14.6 --  INR -- -- -- -- -- -- 1.16 --  HEPARINUNFRC -- 0.45 0.36 -- 0.22* -- -- --  CREATININE -- -- -- -- 0.85 0.90 0.97 --  CKTOTAL 137 -- -- -- -- -- -- --  CKMB 14.2* -- -- -- -- -- -- --  TROPONINI 4.88* -- -- 5.22* 3.18* -- -- --   Estimated Creatinine Clearance: 89.5 ml/min (by C-G formula based on Cr of 0.85).  Medical History: Past Medical History  Diagnosis Date  . Hypertension   . Hypercholesterolemia   . Anginal pain   . DDD (degenerative disc disease), cervical   . Arthritis     "in my back and neck" (09/16/12)  . Malaria     "I've had it a couple times in the 1960's; almost died from it" (09-16-2012)  . PTSD (post-traumatic stress disorder)   . NSTEMI (non-ST elevated myocardial infarction) 11/2011    anterior  . NSTEMI (non-ST elevated myocardial infarction) September 16, 2012  . Coronary artery disease   . CAD, CABG X 09 Nov 2011. re-do CABG X1 09/19/12 12/04/2011   Medications:  Prescriptions prior to admission  Medication Sig Dispense Refill  . aspirin EC 81 MG EC tablet Take 1 tablet (81 mg total) by mouth daily.      Marland Kitchen atorvastatin (LIPITOR) 10 MG tablet Take 10 mg by mouth daily.      . clopidogrel (PLAVIX) 75 MG tablet Take 1 tablet (75 mg total) by mouth daily with  breakfast.  30 tablet  11  . finasteride (PROSCAR) 5 MG tablet Take 5 mg by mouth daily.      . furosemide (LASIX) 20 MG tablet Take 1 tablet (20 mg total) by mouth 2 (two) times daily.  60 tablet  5  . HYDROcodone-acetaminophen (NORCO) 10-325 MG per tablet Take 1 tablet by mouth every 6 (six) hours as needed for pain.  30 tablet  0  . ibuprofen (ADVIL,MOTRIN) 600 MG tablet Take 1 tablet (600 mg total) by mouth 3 (three) times daily with meals.  30 tablet  0  . isosorbide mononitrate (IMDUR) 30 MG 24 hr tablet Take 1 tablet (30 mg total) by mouth daily.  30 tablet  5  . lisinopril (PRINIVIL,ZESTRIL) 2.5 MG tablet Take 0.5 tablets (1.25 mg total) by mouth daily.  30 tablet  1  . metoprolol (LOPRESSOR) 50 MG tablet Take 25 mg by mouth 2 (two) times daily.      . nitroGLYCERIN (NITROSTAT) 0.4 MG SL tablet Place 1 tablet (0.4 mg total) under the tongue every 5 (five) minutes x 3 doses as needed for chest pain.  25 tablet  5  . omeprazole (PRILOSEC) 20 MG capsule Take  20 mg by mouth 2 (two) times daily.      . ranolazine (RANEXA) 1000 MG SR tablet Take 1 tablet (1,000 mg total) by mouth 2 (two) times daily.  60 tablet  11  . terazosin (HYTRIN) 5 MG capsule Take 5 mg by mouth at bedtime.       Assessment: 66 y/oM on heparin for chest pain/unstable angina/CAD. No bleeding noted; and his H/H has remained stable today.  His heparin level is within the therapeutic range at 0.45 IU/ml.     Heparin level therapeutic this AM on 1400 units/hr.  Goal of Therapy:  Heparin level 0.3-0.7 units/ml Monitor platelets by anticoagulation protocol: Yes   Plan:  1. Continue IV heparin at current rate of 1400 units/hr. 2. Daily heparin level, CBC 3. F/U anemia panel, stool guaiac, and s/s bleeding.   Nadara Mustard, PharmD., MS Clinical Pharmacist Pager:  [phone] Thank you for allowing pharmacy to be part of this patients care team.  10/21/2012 5:57 PM

## 2012-10-21 NOTE — Progress Notes (Signed)
Subjective: Feels nauseated, and tired -- didn't sleep well. Scared.  Objective: Vital signs in last 24 hours: Temp:  [97.8 F (36.6 C)-98.6 F (37 C)] 98.6 F (37 C) (12/16 0754) Pulse Rate:  [60-79] 61  (12/16 0800) Resp:  [10-25] 12  (12/16 0800) BP: (79-138)/(48-80) 94/48 mmHg (12/16 0800) SpO2:  [98 %-100 %] 99 % (12/16 0800) FiO2 (%):  [2 %] 2 % (12/15 1800) Weight:  [80.287 kg (177 lb)-84.4 kg (186 lb 1.1 oz)] 84.4 kg (186 lb 1.1 oz) (12/16 0600) Weight change:    Intake/Output from previous day:  +604 12/15 0701 - 12/16 0700 In: 1461.8 [P.O.:1000; I.V.:459.8; IV Piggyback:2] Out: 830 [Urine:830] Intake/Output this shift: Total I/O In: 27 [I.V.:27] Out: -   PE: General: emotional, fatigued, pale, but no acute distress Heart: RRR, nl S1/S2; no M/R/G, no JVD Lungs: scattered rhonchi that clear with cough; non-labored. Abd: soft/NT/ND with ~NABS, no HSM Ext: no C/C/E, 2+ pulses Neuro: A&O x 3, CN II-XII grossly intact.   Lab Results:  Basename 10/21/12 0221 10/20/12 1346 10/20/12 1335  WBC 7.8 -- 10.8*  HGB 8.8* 11.6* --  HCT 27.7* 34.0* --  PLT 220 -- 262   BMET  Basename 10/21/12 0221 10/20/12 1346 10/20/12 1335  NA 139 141 --  K 3.4* 3.4* --  CL 108 107 --  CO2 21 -- 20  GLUCOSE 93 102* --  BUN 16 19 --  CREATININE 0.85 0.90 --  CALCIUM 8.5 -- 9.3    Basename 10/21/12 0221 10/20/12 2044  TROPONINI 3.18* 2.30*    Lab Results  Component Value Date   CHOL 102 10/21/2012   HDL 36* 10/21/2012   LDLCALC 52 10/21/2012   TRIG 71 10/21/2012   CHOLHDL 2.8 10/21/2012   Lab Results  Component Value Date   HGBA1C 6.0* 09/03/2012     Lab Results  Component Value Date   TSH 2.229 09/10/2012    Hepatic Function Panel No results found for this basename: PROT,ALBUMIN,AST,ALT,ALKPHOS,BILITOT,BILIDIR,IBILI in the last 72 hours  Basename 10/21/12 0221  CHOL 102   No results found for this basename: PROTIME in the last 72 hours   EKG: NSR 80, LVH  with mild ST depression in I, aVL & ~0.5 mm STE in aVR. -- dramatic improvement from presenting ECG.  Studies/Results: Dg Chest Port 1 View  10/20/2012  *RADIOLOGY REPORT*  Clinical Data: Chest pain.  Coronary artery disease.  PORTABLE CHEST - 1 VIEW  Comparison: Chest x-ray dated 10/16/2012 and 10/14/2012  Findings: Heart size and vascularity are normal considering the AP portable technique.  Lungs are clear.  No effusions.  No acute osseous abnormality.  IMPRESSION: No acute abnormality.   Original Report Authenticated By: Francene Boyers, M.D.     Medications: I have reviewed the patient's current medications.    Marland Kitchen amLODipine  5 mg Oral Daily  . antiseptic oral rinse  15 mL Mouth Rinse BID  . aspirin EC  81 mg Oral Daily  . atorvastatin  10 mg Oral q1800  . carvedilol  3.125 mg Oral BID WC  . clopidogrel  75 mg Oral Q breakfast  . finasteride  5 mg Oral Daily  . influenza  inactive virus vaccine  0.5 mL Intramuscular Tomorrow-1000  . pantoprazole  40 mg Oral QAC breakfast  . pneumococcal 23 valent vaccine  0.5 mL Intramuscular Tomorrow-1000  . ranolazine  1,000 mg Oral BID  . terazosin  5 mg Oral QHS   Assessment/Plan: Principal Problem:  *Acute coronary  syndrome  Active Problems:  S/P CABG x 3: LIMA TO LAD, SVG TO OM, SVG TO RCA., VG to OM occluded, jeopardized LCX, by cath 08/29/12  CAD, CABG X 09 Nov 2011. re-do CABG X1 09/19/12,   Unstable angina, on appropriate medical therapy, re-do CABG 09/19/12 X1 with SVG-OM 09/19/12  Abnormal EKG, marked diffuse downsloping ST depression,ST elevation AVR and V1, 10/20/12  HTN (hypertension)  ICM, EF 40% Jan 2013,  50-55% Oct 2013, now 40-45% with apical infarct  Dyslipidemia  BPH (benign prostatic hyperplasia)  Anemia  PLAN: + NSTEMI, prob spasm from EKG and severity of pain.  Today H/H have dropped.  Will check anemia panel, stool. Currently on Heparin and NTG at 10 mcg down from 50 mcg/min on admit.    LOS: 1 day   DarrellDarrell Green  R 10/21/2012, 9:16 AM  I have seen and evaluated (my exam above) the patient this morning along with Nada Boozer, NP. I agree with her findings, examination as well as impression recommendations.   NSTEMI - I have reviewed all of his Angiography and ECGs / recent echo -- all lead to consideration of LAD distribution ischemia.  ECG today is dramatically improved from yesterday & no surprise that the Troponins are notably more elevated (will add CKB for more specificity & infarct size eval).  His entire Left Coronary Circulation is 100% graft dependent & both grafts are patent -- The LIMA graft is extremely tortuous with a >360 degree turn prior to touching down to a diminutive distal LAD just prior to a bifurcation.  The LAD itself does not reach the apex, a significant SP1 is compromised by a ~90% stenosis in the mid LAD reducing retrograde flow (but is now potentially getting retrograde flow from the new SVG-OM graft that is widely patent).  Unfortunately, due to the severity of the ostial LM disease at the time of his original presentation, we do not have great imaging of the distal LAD territory as a baseline (but it did not appear to be large distally).    With rapid resolution of symptoms & ECG changes, the Dx of spasm seems to be the leading option -- my concern is with such tortuosity in the LIMA graft, could the vessel kink & lead to spasm that would jeopardize the entire LAD distribution.  The apical LAD WMA on recent Echo (not seen of LVGram) seems new, also corroborating the LAD perfusion concern.  Unfortunately, there are no percutaneous options (aside from retrograde CTO attempt at the ostial LM via the new SVG -- will discuss with our CTO colleagues re feasibility)  I have consulted Dr. Laneta Simmers, for his opinions on the LIMA graft -- how possible would it be to "pull up the slack" in the LIMA graft to decrease the likelihood of kinking related spasm.  For now, will treat medically with  Heparin/Nitrates + Ranexa & adjust medications to treat spasm as per Dr. Erin Hearing plan.  Would keep on IV NTG overnight tonite & monitor in ICUTCU until Troponin levels plateau.  Bedrest today - ambulate tomorrow.  If there are no other feasible options for Surgical or Percutaneous treatment -- ? Consider referral for transplant evaluation.  Will discuss with Dr. Gala Romney as well.  Hgb low today -- rechecking, hemeoccult stool today.    His BP is borderline - but has had elevated LVEDP (certainly will have in post MI setting), avoid Fluid boluses.  Antiemetics & PPI  Valerya Maxton W, M.D., M.S. [facility]  [address] [place], [place]  [address]  [phone] Pager # [phone] 10/21/2012 10:17 AM

## 2012-10-21 NOTE — Progress Notes (Signed)
ANTICOAGULATION CONSULT NOTE - Initial Consult  Pharmacy Consult for heparin Indication: chest pain/ACS/STEMI  No Known Allergies  Patient Measurements: Height: 5\' 10"  (177.8 cm) Weight: 186 lb 1.1 oz (84.4 kg) IBW/kg (Calculated) : 73  Heparin Dosing Weight: 80.3 kg  Vital Signs: Temp: 98.6 F (37 C) (12/16 0754) Temp src: Oral (12/16 0754) BP: 101/57 mmHg (12/16 1100) Pulse Rate: 66  (12/16 1100)  Labs:  Basename 10/21/12 0940 10/21/12 0936 10/21/12 0221 10/20/12 2044 10/20/12 1346 10/20/12 1335 10/19/12 0730  HGB -- -- 8.8* -- 11.6* -- --  HCT -- -- 27.7* -- 34.0* 32.7* --  PLT -- -- 220 -- -- 262 250  APTT -- -- -- -- -- 32 --  LABPROT -- -- -- -- -- 14.6 --  INR -- -- -- -- -- 1.16 --  HEPARINUNFRC 0.36 -- 0.22* 0.27* -- -- --  CREATININE -- -- 0.85 -- 0.90 0.97 --  CKTOTAL -- -- -- -- -- -- --  CKMB -- -- -- -- -- -- --  TROPONINI -- 5.22* 3.18* 2.30* -- -- --    Estimated Creatinine Clearance: 89.5 ml/min (by C-G formula based on Cr of 0.85).   Medical History: Past Medical History  Diagnosis Date  . Hypertension   . Hypercholesterolemia   . Anginal pain   . DDD (degenerative disc disease), cervical   . Arthritis     "in my back and neck" (08/30/12)  . Malaria     "I've had it a couple times in the 1960's; almost died from it" (2012/08/30)  . PTSD (post-traumatic stress disorder)   . NSTEMI (non-ST elevated myocardial infarction) 11/2011    anterior  . NSTEMI (non-ST elevated myocardial infarction) 2012-08-30  . Coronary artery disease   . CAD, CABG X 09 Nov 2011. re-do CABG X1 09/19/12 12/04/2011    Medications:  Prescriptions prior to admission  Medication Sig Dispense Refill  . aspirin EC 81 MG EC tablet Take 1 tablet (81 mg total) by mouth daily.      Marland Kitchen atorvastatin (LIPITOR) 10 MG tablet Take 10 mg by mouth daily.      . clopidogrel (PLAVIX) 75 MG tablet Take 1 tablet (75 mg total) by mouth daily with breakfast.  30 tablet  11  . finasteride  (PROSCAR) 5 MG tablet Take 5 mg by mouth daily.      . furosemide (LASIX) 20 MG tablet Take 1 tablet (20 mg total) by mouth 2 (two) times daily.  60 tablet  5  . HYDROcodone-acetaminophen (NORCO) 10-325 MG per tablet Take 1 tablet by mouth every 6 (six) hours as needed for pain.  30 tablet  0  . ibuprofen (ADVIL,MOTRIN) 600 MG tablet Take 1 tablet (600 mg total) by mouth 3 (three) times daily with meals.  30 tablet  0  . isosorbide mononitrate (IMDUR) 30 MG 24 hr tablet Take 1 tablet (30 mg total) by mouth daily.  30 tablet  5  . lisinopril (PRINIVIL,ZESTRIL) 2.5 MG tablet Take 0.5 tablets (1.25 mg total) by mouth daily.  30 tablet  1  . metoprolol (LOPRESSOR) 50 MG tablet Take 25 mg by mouth 2 (two) times daily.      . nitroGLYCERIN (NITROSTAT) 0.4 MG SL tablet Place 1 tablet (0.4 mg total) under the tongue every 5 (five) minutes x 3 doses as needed for chest pain.  25 tablet  5  . omeprazole (PRILOSEC) 20 MG capsule Take 20 mg by mouth 2 (two) times daily.      Marland Kitchen  ranolazine (RANEXA) 1000 MG SR tablet Take 1 tablet (1,000 mg total) by mouth 2 (two) times daily.  60 tablet  11  . terazosin (HYTRIN) 5 MG capsule Take 5 mg by mouth at bedtime.        Assessment: 66 y/oM on heparin for chest pain/unstable angina/CAD. No bleeding noted; but noted drop in Hgb.  Platelet count stable.    Heparin level therapeutic this AM on 1400 units/hr.  Goal of Therapy:  Heparin level 0.3-0.7 units/ml Monitor platelets by anticoagulation protocol: Yes   Plan:  1. Continue IV heparin at current rate of 1400 units/hr. 2. 6 hr heparin level  3. Daily heparin level, CBC 4. F/U anemia panel, stool guaiac, and s/s bleeding.   Reece Leader, Pharm D 10/21/2012 11:37 AM

## 2012-10-21 NOTE — Progress Notes (Signed)
ANTICOAGULATION CONSULT NOTE - Follow Up Consult  Pharmacy Consult for heparin Indication: chest pain/ACS/STEMI  No Known Allergies  Patient Measurements: Height: 5\' 10"  (177.8 cm) Weight: 183 lb 3.2 oz (83.1 kg) IBW/kg (Calculated) : 73  Heparin Dosing Weight: 80.3 kg  Vital Signs: Temp: 98.3 F (36.8 C) (12/16 0000) Temp src: Oral (12/16 0000) BP: 92/55 mmHg (12/16 0000) Pulse Rate: 71  (12/16 0000)  Labs:  Basename 10/21/12 0221 10/20/12 2044 10/20/12 1456 10/20/12 1346 10/20/12 1335 10/19/12 0730 10/18/12 0847  HGB 8.8* -- -- 11.6* -- -- --  HCT 27.7* -- -- 34.0* 32.7* -- --  PLT 220 -- -- -- 262 250 --  APTT -- -- -- -- 32 -- --  LABPROT -- -- -- -- 14.6 -- --  INR -- -- -- -- 1.16 -- --  HEPARINUNFRC 0.22* 0.27* -- -- -- -- --  CREATININE -- -- -- 0.90 0.97 -- 0.80  CKTOTAL -- -- -- -- -- -- --  CKMB -- -- -- -- -- -- --  TROPONINI -- 2.30* 0.41* -- -- -- --    Estimated Creatinine Clearance: 84.5 ml/min (by C-G formula based on Cr of 0.9).   Medical History: Past Medical History  Diagnosis Date  . Hypertension   . Hypercholesterolemia   . Anginal pain   . DDD (degenerative disc disease), cervical   . Arthritis     "in my back and neck" (2012-09-20)  . Malaria     "I've had it a couple times in the 1960's; almost died from it" (2012-09-20)  . PTSD (post-traumatic stress disorder)   . NSTEMI (non-ST elevated myocardial infarction) 11/2011    anterior  . NSTEMI (non-ST elevated myocardial infarction) 2012/09/20  . Coronary artery disease   . CAD, CABG X 09 Nov 2011. re-do CABG X1 09/19/12 12/04/2011    Medications:  Prescriptions prior to admission  Medication Sig Dispense Refill  . aspirin EC 81 MG EC tablet Take 1 tablet (81 mg total) by mouth daily.      Marland Kitchen atorvastatin (LIPITOR) 10 MG tablet Take 10 mg by mouth daily.      . clopidogrel (PLAVIX) 75 MG tablet Take 1 tablet (75 mg total) by mouth daily with breakfast.  30 tablet  11  . finasteride  (PROSCAR) 5 MG tablet Take 5 mg by mouth daily.      . furosemide (LASIX) 20 MG tablet Take 1 tablet (20 mg total) by mouth 2 (two) times daily.  60 tablet  5  . HYDROcodone-acetaminophen (NORCO) 10-325 MG per tablet Take 1 tablet by mouth every 6 (six) hours as needed for pain.  30 tablet  0  . ibuprofen (ADVIL,MOTRIN) 600 MG tablet Take 1 tablet (600 mg total) by mouth 3 (three) times daily with meals.  30 tablet  0  . isosorbide mononitrate (IMDUR) 30 MG 24 hr tablet Take 1 tablet (30 mg total) by mouth daily.  30 tablet  5  . lisinopril (PRINIVIL,ZESTRIL) 2.5 MG tablet Take 0.5 tablets (1.25 mg total) by mouth daily.  30 tablet  1  . metoprolol (LOPRESSOR) 50 MG tablet Take 25 mg by mouth 2 (two) times daily.      . nitroGLYCERIN (NITROSTAT) 0.4 MG SL tablet Place 1 tablet (0.4 mg total) under the tongue every 5 (five) minutes x 3 doses as needed for chest pain.  25 tablet  5  . omeprazole (PRILOSEC) 20 MG capsule Take 20 mg by mouth 2 (two) times daily.      Marland Kitchen  ranolazine (RANEXA) 1000 MG SR tablet Take 1 tablet (1,000 mg total) by mouth 2 (two) times daily.  60 tablet  11  . terazosin (HYTRIN) 5 MG capsule Take 5 mg by mouth at bedtime.        Assessment: 66 y/oM on heparin for chest pain/unstable angina. Heparin level (0.22) is below-goal on 1200 units/hr. No problem with line / infusion per RN. Hgb trending down, no signs/symptoms of bleeding per RN.   Goal of Therapy:  Heparin level 0.3-0.7 units/ml Monitor platelets by anticoagulation protocol: Yes   Plan:  1. Heparin IV bolus of 1000 units x 1, then increase IV infusion to 1400 units/hr.  2. Heparin level in 6 hours.   Lorre Munroe, PharmD 10/21/2012 3:35 AM

## 2012-10-21 NOTE — Progress Notes (Signed)
Discussed with Dr. Herbie Baltimore Will plan for Nuc study in AM to evaluate for ischemia-  Troponin continues to climb.

## 2012-10-21 NOTE — Care Management Note (Signed)
    Page 1 of 1   10/21/2012     10:35:24 AM   CARE MANAGEMENT NOTE 10/21/2012  Patient:  [name], [name]   Account Number:  [id number]  Date Initiated:  10/21/2012  Documentation initiated by:  Junius Creamer  Subjective/Objective Assessment:   adm w angina, pos troponins     Action/Plan:   lives w wife, pcp is [facility]   Anticipated DC Date:     Anticipated DC Plan:        DC Planning Services  CM consult      Choice offered to / List presented to:             Status of service:   Medicare Important Message given?   (If response is "NO", the following Medicare IM given date fields will be blank) Date Medicare IM given:   Date Additional Medicare IM given:    Discharge Disposition:    Per UR Regulation:  Reviewed for med. necessity/level of care/duration of stay  If discussed at Long Length of Stay Meetings, dates discussed:    Comments:  12/16 10:34a debbie Joshva Labreck rn,bsn [phone]

## 2012-10-21 NOTE — Progress Notes (Signed)
Drop in H/H, will check anemia panel, added K dur for hypokalemia today.

## 2012-10-22 LAB — CBC
HCT: 26.9 % — ABNORMAL LOW (ref 39.0–52.0)
Hemoglobin: 8.5 g/dL — ABNORMAL LOW (ref 13.0–17.0)
MCHC: 31.6 g/dL (ref 30.0–36.0)
MCV: 83.5 fL (ref 78.0–100.0)
RDW: 15.6 % — ABNORMAL HIGH (ref 11.5–15.5)

## 2012-10-22 LAB — BASIC METABOLIC PANEL
BUN: 13 mg/dL (ref 6–23)
Chloride: 107 mEq/L (ref 96–112)
Creatinine, Ser: 0.95 mg/dL (ref 0.50–1.35)
GFR calc non Af Amer: 85 mL/min — ABNORMAL LOW (ref >90)
Glucose, Bld: 86 mg/dL (ref 70–99)
Potassium: 3.9 mEq/L (ref 3.5–5.1)

## 2012-10-22 LAB — HEPARIN LEVEL (UNFRACTIONATED): Heparin Unfractionated: 0.52 IU/mL (ref 0.30–0.70)

## 2012-10-22 MED ORDER — HEPARIN SODIUM (PORCINE) 5000 UNIT/ML IJ SOLN
5000.0000 [IU] | Freq: Three times a day (TID) | INTRAMUSCULAR | Status: DC
  Administered 2012-10-22 – 2012-10-25 (×10): 5000 [IU] via SUBCUTANEOUS
  Filled 2012-10-22 (×13): qty 1

## 2012-10-22 MED ORDER — FERROUS SULFATE 325 (65 FE) MG PO TABS
325.0000 mg | ORAL_TABLET | Freq: Three times a day (TID) | ORAL | Status: DC
  Administered 2012-10-22 – 2012-10-25 (×7): 325 mg via ORAL
  Filled 2012-10-22 (×14): qty 1

## 2012-10-22 MED ORDER — AMLODIPINE BESYLATE 2.5 MG PO TABS
2.5000 mg | ORAL_TABLET | Freq: Every day | ORAL | Status: DC
  Administered 2012-10-22: 5 mg via ORAL
  Administered 2012-10-23 – 2012-10-24 (×2): 2.5 mg via ORAL
  Filled 2012-10-22 (×3): qty 1

## 2012-10-22 NOTE — Progress Notes (Signed)
Subjective:  No complaints. Denies CP/SOB. Nausea has resolved.  Objective:  Vital Signs in the last 24 hours: Temp:  [98 F (36.7 C)-98.7 F (37.1 C)] 98.5 F (36.9 C) (12/17 0400) Pulse Rate:  [55-74] 56  (12/17 0700) Resp:  [12-22] 14  (12/17 0700) BP: (78-125)/(45-64) 96/58 mmHg (12/17 0700) SpO2:  [97 %-100 %] 100 % (12/17 0700) Weight:  [82.7 kg (182 lb 5.1 oz)] 82.7 kg (182 lb 5.1 oz) (12/17 0500)  Intake/Output from previous day:  Intake/Output Summary (Last 24 hours) at 10/22/12 0754 Last data filed at 10/22/12 0700  Gross per 24 hour  Intake   1608 ml  Output   1300 ml  Net    308 ml    Physical Exam: General appearance: alert, cooperative and no distress Lungs: clear to auscultation bilaterally Heart: regular rate and rhythm, S1, S2 normal, no murmur, click, rub or gallop Abdomen: soft, non-tender; bowel sounds normal; no masses,  no organomegaly Extremities: No LEE Pulses: 2+ and symmetric Skin: Skin color, texture, turgor normal. No rashes or lesions or warm and dry Neurologic: Grossly normal   Rate: 60  Rhythm: normal sinus rhythm  Lab Results:  Basename 10/22/12 0515 10/21/12 1610 10/21/12 0221  WBC 7.3 -- 7.8  HGB 8.5* 8.8* --  PLT 218 -- 220    Basename 10/22/12 0515 10/21/12 0221  NA 140 139  K 3.9 3.4*  CL 107 108  CO2 23 21  GLUCOSE 86 93  BUN 13 16  CREATININE 0.95 0.85    Basename 10/21/12 2148 10/21/12 1611  TROPONINI 4.03* 4.88*   Hepatic Function Panel No results found for this basename: PROT,ALBUMIN,AST,ALT,ALKPHOS,BILITOT,BILIDIR,IBILI in the last 72 hours  Basename 10/21/12 0221  CHOL 102    Basename 10/20/12 1335  INR 1.16    Imaging: Imaging results have been reviewed  Cardiac Studies:  Assessment/Plan:   Principal Problem:  *Acute coronary syndrome  Active Problems:  CAD, CABG X 09 Nov 2011. re-do CABG X1 09/19/12,   Anemia  HTN (hypertension)  ICM, EF 40% Jan 2013,  50-55% Oct 2013, now 40-45% with  apical infarct  Dyslipidemia  BPH (benign prostatic hyperplasia)  S/P CABG x 3: LIMA TO LAD, SVG TO OM, SVG TO RCA., VG to OM occluded, jeopardized LCX, by cath 08/29/12  Unstable angina, on appropriate medical therapy, re-do CABG 09/19/12 X1 with SVG-OM 09/19/12  Abnormal EKG, marked diffuse downsloping ST depression,ST elevation AVR and V1, 10/20/12  Plan: Currently CP free. Plan for Thallium redistribution study tomorrow. H/H continues to downtrend 8.5/26.9 (8.8/28.4 yesterday and 11.6/34.0 on admission). Will order stool guaiac test today and will continue to monitor. Will give iron supplements. Heparin has been d/c for now. K+ has improved from yesterday to normal at 3.9.  BP and HR stable.    Darrell Shelter PA-C 10/22/2012, 7:54 AM   I have seen and examined the patient along with Darrell Shelter PA-C.  I have reviewed the chart, notes and new data.  I agree with PA's note.  Key new complaints: no further nausea or CP Key examination changes: no signs of HF; no overt bleeding, soft abdomen. Low BP limits vasodilator use Key new findings / data: Fe deficiency  PLAN: 1. I think the appropriate imaging study is a rest redistribution Thallium study -  I do not think we need additional confirmation of apical ischemia and worry about coronary steal in this unusual coronary situation. Information re: extent of viable myocardium in the distal LAD distribution would  be of benefit. 2. No overt evidence of bleeding. Note Hgb was in 9-10 range on last admission. Current drop in Hgb may be dilutional + Fe deficiency. At this point IV heparin of questionable benefit (about 72h since onset of NSTEMI, TnI trending down and symptom free). Continue prophylactic dose heparin. 3. Management of his ischemic heart disease is a big challenge as there are no good revascularization options.  Thurmon Fair, MD, [facility] [phone] 10/22/2012, 8:53 AM

## 2012-10-23 ENCOUNTER — Inpatient Hospital Stay (HOSPITAL_COMMUNITY): Payer: Non-veteran care

## 2012-10-23 LAB — CBC
MCH: 26.4 pg (ref 26.0–34.0)
MCV: 83.2 fL (ref 78.0–100.0)
Platelets: 212 10*3/uL (ref 150–400)
RDW: 15.6 % — ABNORMAL HIGH (ref 11.5–15.5)
WBC: 7.7 10*3/uL (ref 4.0–10.5)

## 2012-10-23 MED ORDER — DOCUSATE SODIUM 100 MG PO CAPS
200.0000 mg | ORAL_CAPSULE | Freq: Every day | ORAL | Status: DC
  Administered 2012-10-23 – 2012-10-24 (×2): 200 mg via ORAL
  Filled 2012-10-23 (×3): qty 2

## 2012-10-23 MED ORDER — CLOPIDOGREL BISULFATE 75 MG PO TABS: 75.0000 mg | ORAL_TABLET | Freq: Every day | ORAL | Status: DC

## 2012-10-23 MED ORDER — THALLOUS CHLORIDE TL 201 1 MCI/ML IV SOLN
3.0000 | Freq: Once | INTRAVENOUS | Status: AC | PRN
  Administered 2012-10-23: 3 via INTRAVENOUS

## 2012-10-23 MED ORDER — ISOSORBIDE MONONITRATE ER 60 MG PO TB24
60.0000 mg | ORAL_TABLET | Freq: Every day | ORAL | Status: DC
  Administered 2012-10-23 – 2012-10-25 (×3): 60 mg via ORAL
  Filled 2012-10-23 (×3): qty 1

## 2012-10-23 MED ORDER — POLYETHYLENE GLYCOL 3350 17 G PO PACK
17.0000 g | PACK | Freq: Every day | ORAL | Status: DC | PRN
  Filled 2012-10-23: qty 1

## 2012-10-23 NOTE — Progress Notes (Signed)
Subjective: No complaints except no bm  Objective: Vital signs in last 24 hours: Temp:  [98.2 F (36.8 C)-98.8 F (37.1 C)] 98.2 F (36.8 C) (12/18 0400) Pulse Rate:  [50-74] 56  (12/18 0700) Resp:  [13-25] 20  (12/18 0700) BP: (77-114)/(38-65) 103/53 mmHg (12/18 0700) SpO2:  [94 %-100 %] 100 % (12/18 0700) Weight:  [81.4 kg (179 lb 7.3 oz)] 81.4 kg (179 lb 7.3 oz) (12/18 0500) Weight change: -1.3 kg (-2 lb 13.9 oz)   Intake/Output from previous day: -969 12/17 0701 - 12/18 0700 In: 876.5 [P.O.:540; I.V.:336.5] Out: 1860 [Urine:1860] Intake/Output this shift:    PE: General:alert and oriented, pleasant affect Heart:S1S2 RRR Lungs:clear, non-labored, no R/R/W Abd:+ BS, soft, non tender Ext:no edema    Lab Results:  [facility] 10/23/12 0519 10/22/12 0515  WBC 7.7 7.3  HGB 8.8* 8.5*  HCT 27.7* 26.9*  PLT 212 218   BMET  Basename 10/22/12 0515 10/21/12 0221  NA 140 139  K 3.9 3.4*  CL 107 108  CO2 23 21  GLUCOSE 86 93  BUN 13 16  CREATININE 0.95 0.85  CALCIUM 8.7 8.5    Basename 10/21/12 2148 10/21/12 1611  TROPONINI 4.03* 4.88*    Lab Results  Component Value Date   CHOL 102 10/21/2012   HDL 36* 10/21/2012   LDLCALC 52 10/21/2012   TRIG 71 10/21/2012   CHOLHDL 2.8 10/21/2012   Lab Results  Component Value Date   HGBA1C 6.0* 09/03/2012     Lab Results  Component Value Date   TSH 2.229 09/10/2012    Studies/Results: No results found.  Medications: I have reviewed the patient's current medications.    Marland Kitchen amLODipine  2.5 mg Oral Daily  . antiseptic oral rinse  15 mL Mouth Rinse BID  . aspirin EC  81 mg Oral Daily  . atorvastatin  10 mg Oral q1800  . carvedilol  3.125 mg Oral BID WC  . clopidogrel  75 mg Oral Q breakfast  . ferrous sulfate  325 mg Oral TID WC  . finasteride  5 mg Oral Daily  . heparin subcutaneous  5,000 Units Subcutaneous Q8H  . pantoprazole  40 mg Oral QAC breakfast  . ranolazine  1,000 mg Oral BID  . terazosin  5 mg Oral  QHS   Assessment/Plan: Principal Problem:  *Acute coronary syndrome  Active Problems:  S/P CABG x 3: LIMA TO LAD, SVG TO OM, SVG TO RCA., VG to OM occluded, jeopardized LCX, by cath 08/29/12  CAD, CABG X 09 Nov 2011. re-do CABG X1 09/19/12,   Unstable angina, on appropriate medical therapy, re-do CABG 09/19/12 X1 with SVG-OM 09/19/12  Abnormal EKG, marked diffuse downsloping ST depression,ST elevation AVR and V1, 10/20/12  HTN (hypertension)  ICM, EF 40% Jan 2013,  50-55% Oct 2013, now 40-45% with apical infarct  Dyslipidemia  BPH (benign prostatic hyperplasia)  Anemia  PLAN:for thallium viability scan today. complains of constipation, add colace miralax up to [facility], ? Phase 1 cardiac rehab, ? Transfer to SDU?  Hgb at 8.8. Off heparin.  BP low stable, with NTG still at 10 mcg/min,  ? Resume imdur and d/c NTG? And then increase Norvasc  LOS: 3 days   [name],LAURA R 10/23/2012, 8:01 AM   ATTENDING ATTESTATION:  I have seen and examined the patient along with Nada Boozer, NP.  I have reviewed the chart, notes and new data.  I agree with Laura's findings, examination & recommendations as noted above.   Key new  complaints: constipated.  Still has yet to walk - no CP/SOB @ rest.  Key examination changes: stable exam  Key new findings / data: H/H stable; Troponin peaked @ 4.88 now on its way down; ECG no longer has "ischemic changes"  PLAN:  Convert to PO Nitrate from IV  Transfer to Tele to allow free ambulation.  Thalium ST today per Dr. Erin Hearing recommendation.  BP stable - tolerating Amlodipine & Coreg, would try to increase Amlodipine to 5mg  prior to d/c (once NTG gtt off).  Statin, ASA & Plavix for existing CAD; Ranexa @ max dose  Continue to review cath films with additional colleagues to determine if any potential invasive options may be available pending results of Thalium study.  Marykay Lex, M.D., M.S. [facility] [address] [place], [place]  [address]  [phone]  10/23/2012 10:37 AM

## 2012-10-24 ENCOUNTER — Encounter (HOSPITAL_COMMUNITY): Payer: Self-pay | Admitting: Cardiology

## 2012-10-24 DIAGNOSIS — Z77098 Contact with and (suspected) exposure to other hazardous, chiefly nonmedicinal, chemicals: Secondary | ICD-10-CM

## 2012-10-24 LAB — CBC
Hemoglobin: 9.2 g/dL — ABNORMAL LOW (ref 13.0–17.0)
MCHC: 31.9 g/dL (ref 30.0–36.0)
RDW: 15.7 % — ABNORMAL HIGH (ref 11.5–15.5)

## 2012-10-24 LAB — BASIC METABOLIC PANEL
GFR calc Af Amer: >90 mL/min (ref >90)
GFR calc non Af Amer: 88 mL/min — ABNORMAL LOW (ref >90)
Potassium: 3.4 mEq/L — ABNORMAL LOW (ref 3.5–5.1)
Sodium: 139 mEq/L (ref 135–145)

## 2012-10-24 MED ORDER — AMLODIPINE BESYLATE 5 MG PO TABS
5.0000 mg | ORAL_TABLET | Freq: Every day | ORAL | Status: DC
  Administered 2012-10-25: 5 mg via ORAL
  Filled 2012-10-24: qty 1

## 2012-10-24 MED ORDER — THALLOUS CHLORIDE TL 201 1 MCI/ML IV SOLN
3.0000 | Freq: Once | INTRAVENOUS | Status: AC | PRN
  Administered 2012-10-23: 3 via INTRAVENOUS

## 2012-10-24 MED ORDER — THALLOUS CHLORIDE TL 201 1 MCI/ML IV SOLN
1.0000 | Freq: Once | INTRAVENOUS | Status: AC | PRN
  Administered 2012-10-23: 1 via INTRAVENOUS

## 2012-10-24 MED ORDER — ZOLPIDEM TARTRATE 5 MG PO TABS: 5.0000 mg | ORAL_TABLET | Freq: Every evening | ORAL | Status: DC | PRN

## 2012-10-24 MED ORDER — TERAZOSIN HCL 1 MG PO CAPS
1.0000 mg | ORAL_CAPSULE | Freq: Every day | ORAL | Status: DC
Start: 2012-10-24 — End: 2012-10-25
  Administered 2012-10-24: 1 mg via ORAL
  Filled 2012-10-24 (×2): qty 1

## 2012-10-24 NOTE — Progress Notes (Signed)
Subjective:  Some chest soreness, no angina overnight.  Objective:  Vital Signs in the last 24 hours: Temp:  [97.9 F (36.6 C)-98.7 F (37.1 C)] 98.2 F (36.8 C) (12/19 0554) Pulse Rate:  [55-70] 70  (12/19 0554) Resp:  [16-21] 20  (12/19 0554) BP: (96-123)/(43-65) 123/65 mmHg (12/19 0554) SpO2:  [98 %-100 %] 100 % (12/19 0554) Weight:  [81.2 kg (179 lb 0.2 oz)-81.4 kg (179 lb 7.3 oz)] 81.2 kg (179 lb 0.2 oz) (12/19 0554)  Intake/Output from previous day:  Intake/Output Summary (Last 24 hours) at 10/24/12 0853 Last data filed at 10/24/12 0843  Gross per 24 hour  Intake    318 ml  Output    300 ml  Net     18 ml    Physical Exam: General appearance: alert, cooperative and no distress Lungs: decreased breath sounds Rt base Heart: regular rate and rhythm   Rate: 70  Rhythm: normal sinus rhythm  Lab Results:  Basename 10/24/12 0600 10/23/12 0519  WBC 6.7 7.7  HGB 9.2* 8.8*  PLT 237 212    Basename 10/24/12 0600 10/22/12 0515  NA 139 140  K 3.4* 3.9  CL 105 107  CO2 25 23  GLUCOSE 94 86  BUN 16 13  CREATININE 0.88 0.95    Basename 10/21/12 2148 10/21/12 1611  TROPONINI 4.03* 4.88*   Hepatic Function Panel No results found for this basename: PROT,ALBUMIN,AST,ALT,ALKPHOS,BILITOT,BILIDIR,IBILI in the last 72 hours No results found for this basename: CHOL in the last 72 hours No results found for this basename: INR in the last 72 hours  Imaging: Imaging results have been reviewed  Cardiac Studies:  Assessment/Plan:   Principal Problem:  *NSTEMI 10/20/12- (just discharged 10/20/12 after NSTEMI)  Active Problems:  CAD, CABG X 09 Nov 2011. 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  Consider stopping Hytrin and adding Norvasc.    Corine Shelter PA-C 10/24/2012, 8:53 AM   I have seen and examined the patient along with Corine Shelter PA-C.  I have reviewed the chart, notes and new data.  I agree with PA's note.  Key new findings / data: The Thallium viability study shows little evidence of viable tissue in areas of LAD and circumflex coronary artery scarring. The apex appears well perfused.  PLAN: Findings suggest there would be little benefit from additional revascularization attempts. The extensive ECG changes during pain suggested large myocardial territory in jeopardy. The only way I think we can explain this discordance is if he has functional obstruction (such as spasm) rather than fixed/atherosclerotic obstruction. Increase amlodipine, decrease terazosin.  Thurmon Fair, MD, [facility] [phone] 10/24/2012, 3:37 PM

## 2012-10-25 LAB — CBC
Hemoglobin: 9.2 g/dL — ABNORMAL LOW (ref 13.0–17.0)
RBC: 3.45 MIL/uL — ABNORMAL LOW (ref 4.22–5.81)

## 2012-10-25 MED ORDER — CARVEDILOL 3.125 MG PO TABS: 3.1250 mg | ORAL_TABLET | Freq: Two times a day (BID) | ORAL | Status: DC

## 2012-10-25 MED ORDER — AMLODIPINE BESYLATE 5 MG PO TABS: 5.0000 mg | ORAL_TABLET | Freq: Every day | ORAL | Status: DC

## 2012-10-25 MED ORDER — TERAZOSIN HCL 1 MG PO CAPS: 1.0000 mg | ORAL_CAPSULE | Freq: Every day | ORAL | Status: DC

## 2012-10-25 MED ORDER — ISOSORBIDE MONONITRATE ER 60 MG PO TB24: 60.0000 mg | ORAL_TABLET | Freq: Every day | ORAL | Status: DC

## 2012-10-25 MED ORDER — FERROUS SULFATE 325 (65 FE) MG PO TABS: 325.0000 mg | ORAL_TABLET | Freq: Three times a day (TID) | ORAL | Status: AC

## 2012-10-25 MED ORDER — ACETAMINOPHEN 325 MG PO TABS: 650.0000 mg | ORAL_TABLET | ORAL | Status: DC | PRN

## 2012-10-25 NOTE — Discharge Summary (Signed)
Patient ID: [name].,  MRN: [id number], DOB/AGE: 1946-05-07 66 y.o.  Admit date: 10/20/2012 Discharge date: 10/25/2012  Primary Care Provider:  Primary Cardiologist: Dr Allyson Sabal  Discharge Diagnoses  Principal Problem:  *NSTEMI 10/20/12- (just discharged 10/20/12 after NSTEM 12/11/13I)  Active Problems:  CAD, CABG X 09 Nov 2011. 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 male with a past medical history significant for extensive problems with coronary artery disease starting in January of this year. He presented with an acute anterolateral myocardial infarction. Cardiac catheterization showed a 99% ostial stenosis of the left main coronary artery with a mid LAD stenosis and moderate right coronary artery stenosis. He required treatment with an aortic balloon pump and then underwent emergency three-vessel bypass surgery (LIMA to LAD, SVG to OM, SVG to RCA by Dr. [name]). Postoperative left ventricular ejection fraction was estimated to be about 50-55%.     He returned on October 24 with a non-ST segment elevation myocardial infarction. Repeat coronary driver she showed a total total occlusion of the left main coronary artery as well as total occlusion of the saphenous vein graft to the oblique marginal artery. There was a proximal M.D. artery stenosis that prevented adequate flow from the LIMA bypass to the LAD in a retrograde fashion to the left circumflex coronary artery. The bypass graft to the right coronary artery was patent. The patient continued to be very  symptomatic on medical therapy and he underwent single vessel redo bypass surgery to the oblique marginal artery on October 14.     He was discharged from the hospital on October 17. He states that he has not really felt well following the second procedure. He was admitted 10/04/12-10/07/12 with a small NSTEMI. Cath 10/04/12 showed no obvious culprit lesion. He was admitted again 10/16/12- 10/20/12 with NSTEMI with positive Troponin and EKG changes. Cath 10/16/12 again showed no obvious culprit lesion and he was discharged 10/20/12 but cam back later that same day with a NSTEMI, documented by EKG changes and Troponin elevation. He was Rx'd medically. Cines were reviewed. EKG changes suggested an anterior ischemia. Thallium redistribution study was done and showed scar, no viable myocardium The plan is for treatment of presumed coronary spasm. We decreased his Hytrin and added Norvasc. His Imdur was increased and his beta blocker changed. He will continue Ranexa 1gm BID.  We are holding his ACE as his B/P is soft. He has ambulated in the halls without chest pain and we feel he can be discharged 10/25/12.  Discharge Vitals:  Blood pressure 126/59, pulse 59, temperature 98.3 F (36.8 C), temperature source Oral, resp. rate 18, height 5\' 10"  (1.778 m), weight 80.831 kg (178 lb 3.2 oz), SpO2 100.00%.    Labs: Results for orders placed during the hospital encounter of 10/20/12 (from the past 48 hour(s))  CBC     Status: Abnormal   Collection Time   10/24/12  6:00 AM      Component Value Range Comment   WBC 6.7  4.0 - 10.5 K/uL    RBC  3.48 (*) 4.22 - 5.81 MIL/uL    Hemoglobin 9.2 (*) 13.0 - 17.0 g/dL    HCT 16.1 (*) 09.6 - 52.0 %    MCV 82.8  78.0 - 100.0 fL    MCH 26.4  26.0 - 34.0 pg    MCHC 31.9  30.0 - 36.0 g/dL    RDW 04.5 (*) 40.9 - 15.5 %    Platelets 237  150 - 400 K/uL   BASIC METABOLIC PANEL     Status: Abnormal   Collection Time   10/24/12  6:00 AM      Component Value Range Comment    Sodium 139  135 - 145 mEq/L    Potassium 3.4 (*) 3.5 - 5.1 mEq/L    Chloride 105  96 - 112 mEq/L    CO2 25  19 - 32 mEq/L    Glucose, Bld 94  70 - 99 mg/dL    BUN 16  6 - 23 mg/dL    Creatinine, Ser 8.11  0.50 - 1.35 mg/dL    Calcium 8.9  8.4 - 91.4 mg/dL    GFR calc non Af Amer 88 (*) >90 mL/min    GFR calc Af Amer >90  >90 mL/min   CBC     Status: Abnormal   Collection Time   10/25/12  4:45 AM      Component Value Range Comment   WBC 6.8  4.0 - 10.5 K/uL    RBC 3.45 (*) 4.22 - 5.81 MIL/uL    Hemoglobin 9.2 (*) 13.0 - 17.0 g/dL    HCT 78.2 (*) 95.6 - 52.0 %    MCV 82.3  78.0 - 100.0 fL    MCH 26.7  26.0 - 34.0 pg    MCHC 32.4  30.0 - 36.0 g/dL    RDW 21.3 (*) 08.6 - 15.5 %    Platelets 233  150 - 400 K/uL     Disposition:  Follow-up Information    Follow up with Runell Gess, MD. (office will call you)    Contact information:   [address] [place] [address] [phone]    He will see Wilburt Finlay PA in my abscence in a week but I can follow him up in the office afterwards  Corine Shelter PA.      Discharge Medications:    Medication List     As of 10/25/2012 10:20 AM    STOP taking these medications         furosemide 20 MG tablet   Commonly known as: LASIX      lisinopril 2.5 MG tablet   Commonly known as: PRINIVIL,ZESTRIL      metoprolol 50 MG tablet   Commonly known as: LOPRESSOR      TAKE these medications         acetaminophen 325 MG tablet   Commonly known as: TYLENOL   Take 2 tablets (650 mg total) by mouth every 4 (four) hours as needed.      amLODipine 5 MG tablet   Commonly known as: NORVASC   Take 1 tablet (5 mg total) by mouth daily.      aspirin 81 MG EC tablet   Take 1 tablet (81 mg total) by mouth daily.      atorvastatin 10 MG tablet   Commonly known as: LIPITOR   Take 10 mg by mouth daily.      carvedilol 3.125 MG tablet   Commonly known as: COREG   Take  1 tablet (3.125 mg total) by mouth 2 (two) times  daily with a meal.      clopidogrel 75 MG tablet   Commonly known as: PLAVIX   Take 1 tablet (75 mg total) by mouth daily with breakfast.      ferrous sulfate 325 (65 FE) MG tablet   Take 1 tablet (325 mg total) by mouth 3 (three) times daily with meals.      finasteride 5 MG tablet   Commonly known as: PROSCAR   Take 5 mg by mouth daily.      HYDROcodone-acetaminophen 10-325 MG per tablet   Commonly known as: NORCO   Take 1 tablet by mouth every 6 (six) hours as needed for pain.      ibuprofen 600 MG tablet   Commonly known as: ADVIL,MOTRIN   Take 1 tablet (600 mg total) by mouth 3 (three) times daily with meals.      isosorbide mononitrate 60 MG 24 hr tablet   Commonly known as: IMDUR   Take 1 tablet (60 mg total) by mouth daily.      nitroGLYCERIN 0.4 MG SL tablet   Commonly known as: NITROSTAT   Place 1 tablet (0.4 mg total) under the tongue every 5 (five) minutes x 3 doses as needed for chest pain.      omeprazole 20 MG capsule   Commonly known as: PRILOSEC   Take 20 mg by mouth 2 (two) times daily.      ranolazine 1000 MG SR tablet   Commonly known as: RANEXA   Take 1 tablet (1,000 mg total) by mouth 2 (two) times daily.      terazosin 1 MG capsule   Commonly known as: HYTRIN   Take 1 capsule (1 mg total) by mouth at bedtime.        Duration of Discharge Encounter: Greater than 30 minutes including physician time.  Signed, Corine Shelter PA-C 10/25/2012 10:20 AM  I have seen and evaluated the patient this morning along with Corine Shelter, PA. I agree with his summary,   Mr. Fanelli has been walking up & down the halls with no further Anginal symptoms. His BP & HR are stable.  Unfortunately, it appears that the LAD territory is diffusely scarred, leaving little to be done from a percutaneous approach. Angiographically, no true culprit lesion can be found, leaving mechanical - spasm vs. Kinking as the leading Dx for etiology.  We have shifted his medical Rx to a  Spasm regimen - increased CCB dose & full dose Ranexa.  I agree that he should be ready for discharge, but will need close f/u.  I spent at least 10 min personally examining & counseling the patient.  .dhsig

## 2012-10-25 NOTE — Progress Notes (Signed)
Pt d/c to home with family. D/c instructions and medications reviewed with Pt. Pt states understanding. [name] Pt questions answered .

## 2012-10-25 NOTE — Progress Notes (Signed)
Subjective:  No chest pain with ambulation.  Objective:  Vital Signs in the last 24 hours: Temp:  [97.6 F (36.4 C)-98.3 F (36.8 C)] 98.3 F (36.8 C) (12/20 0443) Pulse Rate:  [60-74] 60  (12/20 0443) Resp:  [16-18] 18  (12/20 0443) BP: (102-118)/(61-78) 102/61 mmHg (12/20 0443) SpO2:  [98 %-100 %] 100 % (12/20 0443) Weight:  [80.831 kg (178 lb 3.2 oz)] 80.831 kg (178 lb 3.2 oz) (12/20 0443)  Intake/Output from previous day:  Intake/Output Summary (Last 24 hours) at 10/25/12 5284 Last data filed at 10/25/12 1324  Gross per 24 hour  Intake    480 ml  Output   1101 ml  Net   -621 ml    Physical Exam: General appearance: alert, cooperative and no distress Lungs: clear to auscultation bilaterally Heart: regular rate and rhythm   Rate: 60  Rhythm: normal sinus rhythm  Lab Results:  Basename 10/25/12 0445 10/24/12 0600  WBC 6.8 6.7  HGB 9.2* 9.2*  PLT 233 237    Basename 10/24/12 0600  NA 139  K 3.4*  CL 105  CO2 25  GLUCOSE 94  BUN 16  CREATININE 0.88   No results found for this basename: TROPONINI:2,CK,MB:2 in the last 72 hours Hepatic Function Panel No results found for this basename: PROT,ALBUMIN,AST,ALT,ALKPHOS,BILITOT,BILIDIR,IBILI in the last 72 hours No results found for this basename: CHOL in the last 72 hours No results found for this basename: INR in the last 72 hours  Imaging: Imaging results have been reviewed  Cardiac Studies:  Assessment/Plan:   Principal Problem:  *NSTEMI 10/20/12- (just discharged 10/20/12 after NSTEMI) Active Problems:  CAD, CABG X 09 Nov 2011. re-do CABG X1 09/19/12, cath 12/11/3- medical Rx  Abnormal EKG, marked diffuse downsloping ST depression,ST elevation AVR and V1, 10/20/12  ICM, EF 40% Jan 2013,  50-55% Oct 2013,  40-45% with apical infarct 10/18/12  Former smoker  HTN (hypertension)  Dyslipidemia  BPH (benign prostatic hyperplasia)  Anemia, heme negative 10/06/12  Hx of agent Orange exposure, he is followed  at the [place]   Plan-Thallium showed scar, no obvious viability. Plan is medical Rx for presumed coronary spasm. Norvasc increased and Hytrin decreased. Should be OK for discharge today with plans for early follow up, ( I can see) It appears Norvasc is new- B/P is soft, will discharge on 5mg  daily.  Corine Shelter PA-C 10/25/2012, 9:23 AM  I have seen and evaluated the patient this morning along with Corine Shelter, PA. I agree with his findings, examination as well as impression recommendations.  Mr. Ginyard has been walking up & down the halls with no further Anginal symptoms.  His BP & HR are stable.  Unfortunately, it appears that the LAD territory is diffusely scarred, leaving little to be done from a percutaneous approach.  Angiographically, no true culprit lesion can be found, leaving mechanical - spasm vs. Kinking as the leading Dx for etiology.  We have shifted his medical Rx to a Spasm regimen - increased CCB dose & full dose Ranexa.  I agree that he should be ready for discharge, but will need close f/u.  I spent at least 10 min personally examining & counseling the patient.  Marykay Lex, M.D., M.S. [facility] [address] [place], [place]  [address]  [phone] Pager # [phone] 10/25/2012 10:19 AM

## 2012-10-29 DIAGNOSIS — R079 Chest pain, unspecified: Secondary | ICD-10-CM | POA: Diagnosis not present

## 2012-10-30 ENCOUNTER — Inpatient Hospital Stay (HOSPITAL_COMMUNITY)
Admission: EM | Admit: 2012-10-30 | Discharge: 2012-11-01 | DRG: 247 | Disposition: A | Discharge status: Home / Self Care | Payer: Medicare Other | Attending: Cardiovascular Disease | Admitting: Cardiovascular Disease

## 2012-10-30 ENCOUNTER — Encounter (HOSPITAL_COMMUNITY): Payer: Self-pay

## 2012-10-30 ENCOUNTER — Emergency Department (HOSPITAL_COMMUNITY): Payer: Medicare Other

## 2012-10-30 DIAGNOSIS — N400 Enlarged prostate without lower urinary tract symptoms: Secondary | ICD-10-CM | POA: Diagnosis present

## 2012-10-30 DIAGNOSIS — E785 Hyperlipidemia, unspecified: Secondary | ICD-10-CM | POA: Diagnosis present

## 2012-10-30 DIAGNOSIS — Z79899 Other long term (current) drug therapy: Secondary | ICD-10-CM

## 2012-10-30 DIAGNOSIS — I2589 Other forms of chronic ischemic heart disease: Secondary | ICD-10-CM | POA: Diagnosis present

## 2012-10-30 DIAGNOSIS — F4310 Post-traumatic stress disorder, unspecified: Secondary | ICD-10-CM | POA: Diagnosis present

## 2012-10-30 DIAGNOSIS — Z7902 Long term (current) use of antithrombotics/antiplatelets: Secondary | ICD-10-CM | POA: Diagnosis not present

## 2012-10-30 DIAGNOSIS — I10 Essential (primary) hypertension: Secondary | ICD-10-CM | POA: Diagnosis present

## 2012-10-30 DIAGNOSIS — I2510 Atherosclerotic heart disease of native coronary artery without angina pectoris: Secondary | ICD-10-CM | POA: Diagnosis present

## 2012-10-30 DIAGNOSIS — I214 Non-ST elevation (NSTEMI) myocardial infarction: Secondary | ICD-10-CM | POA: Diagnosis present

## 2012-10-30 DIAGNOSIS — I255 Ischemic cardiomyopathy: Secondary | ICD-10-CM | POA: Diagnosis present

## 2012-10-30 DIAGNOSIS — R079 Chest pain, unspecified: Secondary | ICD-10-CM | POA: Diagnosis not present

## 2012-10-30 DIAGNOSIS — I25810 Atherosclerosis of coronary artery bypass graft(s) without angina pectoris: Secondary | ICD-10-CM | POA: Diagnosis present

## 2012-10-30 DIAGNOSIS — Z951 Presence of aortocoronary bypass graft: Secondary | ICD-10-CM | POA: Diagnosis not present

## 2012-10-30 DIAGNOSIS — I252 Old myocardial infarction: Secondary | ICD-10-CM

## 2012-10-30 DIAGNOSIS — M5030 Other cervical disc degeneration, unspecified cervical region: Secondary | ICD-10-CM | POA: Diagnosis present

## 2012-10-30 DIAGNOSIS — D649 Anemia, unspecified: Secondary | ICD-10-CM | POA: Diagnosis present

## 2012-10-30 DIAGNOSIS — R9431 Abnormal electrocardiogram [ECG] [EKG]: Secondary | ICD-10-CM | POA: Diagnosis present

## 2012-10-30 DIAGNOSIS — Z77098 Contact with and (suspected) exposure to other hazardous, chiefly nonmedicinal, chemicals: Secondary | ICD-10-CM

## 2012-10-30 DIAGNOSIS — Z7982 Long term (current) use of aspirin: Secondary | ICD-10-CM | POA: Diagnosis not present

## 2012-10-30 DIAGNOSIS — F411 Generalized anxiety disorder: Secondary | ICD-10-CM | POA: Diagnosis present

## 2012-10-30 DIAGNOSIS — Z87891 Personal history of nicotine dependence: Secondary | ICD-10-CM | POA: Diagnosis not present

## 2012-10-30 DIAGNOSIS — I200 Unstable angina: Secondary | ICD-10-CM | POA: Diagnosis not present

## 2012-10-30 LAB — COMPREHENSIVE METABOLIC PANEL
ALT: 14 U/L (ref 0–53)
CO2: 20 mEq/L (ref 19–32)
Calcium: 9.2 mg/dL (ref 8.4–10.5)
GFR calc Af Amer: 78 mL/min — ABNORMAL LOW (ref >90)
GFR calc non Af Amer: 67 mL/min — ABNORMAL LOW (ref >90)
Glucose, Bld: 88 mg/dL (ref 70–99)
Sodium: 142 mEq/L (ref 135–145)
Total Bilirubin: 0.2 mg/dL — ABNORMAL LOW (ref 0.3–1.2)

## 2012-10-30 LAB — POCT I-STAT, CHEM 8
BUN: 20 mg/dL (ref 6–23)
Calcium, Ion: 1.17 mmol/L (ref 1.13–1.30)
Chloride: 112 mEq/L (ref 96–112)
HCT: 33 % — ABNORMAL LOW (ref 39.0–52.0)
Potassium: 3.5 mEq/L (ref 3.5–5.1)
Sodium: 144 mEq/L (ref 135–145)

## 2012-10-30 LAB — TROPONIN I
Troponin I: 1.28 ng/mL (ref <0.30)
Troponin I: 1.58 ng/mL (ref <0.30)
Troponin I: <0.3 ng/mL (ref <0.30)

## 2012-10-30 LAB — CBC WITH DIFFERENTIAL/PLATELET
Eosinophils Relative: 1 % (ref 0–5)
HCT: 33.2 % — ABNORMAL LOW (ref 39.0–52.0)
Lymphocytes Relative: 24 % (ref 12–46)
Lymphs Abs: 2 10*3/uL (ref 0.7–4.0)
MCV: 84.1 fL (ref 78.0–100.0)
Monocytes Absolute: 0.9 10*3/uL (ref 0.1–1.0)
RBC: 3.95 MIL/uL — ABNORMAL LOW (ref 4.22–5.81)
WBC: 8.3 10*3/uL (ref 4.0–10.5)

## 2012-10-30 LAB — HEPARIN LEVEL (UNFRACTIONATED)
Heparin Unfractionated: 0.73 IU/mL — ABNORMAL HIGH (ref 0.30–0.70)
Heparin Unfractionated: 0.49 IU/mL (ref 0.30–0.70)

## 2012-10-30 LAB — POCT I-STAT TROPONIN I: Troponin i, poc: 0.01 ng/mL (ref 0.00–0.08)

## 2012-10-30 LAB — PRO B NATRIURETIC PEPTIDE: Pro B Natriuretic peptide (BNP): 7014 pg/mL — ABNORMAL HIGH (ref 0–125)

## 2012-10-30 MED ORDER — HEPARIN (PORCINE) IN NACL 100-0.45 UNIT/ML-% IJ SOLN
16.0000 [IU]/kg/h | INTRAMUSCULAR | Status: DC
  Administered 2012-10-30: 16 [IU]/kg/h via INTRAVENOUS
  Filled 2012-10-30: qty 250

## 2012-10-30 MED ORDER — NITROGLYCERIN IN D5W 200-5 MCG/ML-% IV SOLN
INTRAVENOUS | Status: AC
  Administered 2012-10-30: 5 ug via INTRAVENOUS
  Filled 2012-10-30: qty 250

## 2012-10-30 MED ORDER — SODIUM CHLORIDE 0.9 % IV BOLUS (SEPSIS)
500.0000 mL | Freq: Once | INTRAVENOUS | Status: AC
  Administered 2012-10-30: 500 mL via INTRAVENOUS

## 2012-10-30 MED ORDER — PANTOPRAZOLE SODIUM 40 MG PO TBEC
40.0000 mg | DELAYED_RELEASE_TABLET | Freq: Every day | ORAL | Status: DC
  Administered 2012-10-30 – 2012-11-01 (×3): 40 mg via ORAL
  Filled 2012-10-30 (×3): qty 1

## 2012-10-30 MED ORDER — CLOPIDOGREL BISULFATE 75 MG PO TABS
75.0000 mg | ORAL_TABLET | Freq: Every day | ORAL | Status: DC
  Administered 2012-10-30 – 2012-10-31 (×2): 75 mg via ORAL
  Filled 2012-10-30 (×3): qty 1

## 2012-10-30 MED ORDER — MORPHINE SULFATE 2 MG/ML IJ SOLN: 2.0000 mg | INTRAMUSCULAR | Status: DC | PRN

## 2012-10-30 MED ORDER — NITROGLYCERIN IN D5W 200-5 MCG/ML-% IV SOLN: 2.0000 ug/min | INTRAVENOUS | Status: DC

## 2012-10-30 MED ORDER — ONDANSETRON HCL 4 MG/2ML IJ SOLN
4.0000 mg | Freq: Four times a day (QID) | INTRAMUSCULAR | Status: DC | PRN
  Administered 2012-10-30: 4 mg via INTRAVENOUS
  Filled 2012-10-30: qty 2

## 2012-10-30 MED ORDER — SODIUM CHLORIDE 0.9 % IV SOLN: INTRAVENOUS | Status: DC

## 2012-10-30 MED ORDER — HEPARIN BOLUS VIA INFUSION
4000.0000 [IU] | Freq: Once | INTRAVENOUS | Status: AC
  Administered 2012-10-30: 4000 [IU] via INTRAVENOUS

## 2012-10-30 MED ORDER — ACETAMINOPHEN 325 MG PO TABS: 650.0000 mg | ORAL_TABLET | ORAL | Status: DC | PRN

## 2012-10-30 MED ORDER — CARVEDILOL 3.125 MG PO TABS
3.1250 mg | ORAL_TABLET | Freq: Two times a day (BID) | ORAL | Status: DC
  Administered 2012-10-30 – 2012-11-01 (×5): 3.125 mg via ORAL
  Filled 2012-10-30 (×7): qty 1

## 2012-10-30 MED ORDER — ONDANSETRON HCL 4 MG/2ML IJ SOLN
4.0000 mg | Freq: Once | INTRAMUSCULAR | Status: AC
  Administered 2012-10-30: 4 mg via INTRAVENOUS
  Filled 2012-10-30: qty 2

## 2012-10-30 MED ORDER — RANOLAZINE ER 500 MG PO TB12
1000.0000 mg | ORAL_TABLET | Freq: Two times a day (BID) | ORAL | Status: DC
  Administered 2012-10-30 – 2012-11-01 (×4): 1000 mg via ORAL
  Filled 2012-10-30 (×6): qty 2

## 2012-10-30 MED ORDER — HEPARIN (PORCINE) IN NACL 100-0.45 UNIT/ML-% IJ SOLN
1200.0000 [IU]/h | INTRAMUSCULAR | Status: DC
  Administered 2012-10-30 – 2012-10-31 (×2): 1200 [IU]/h via INTRAVENOUS
  Filled 2012-10-30 (×4): qty 250

## 2012-10-30 MED ORDER — FERROUS SULFATE 325 (65 FE) MG PO TABS
325.0000 mg | ORAL_TABLET | Freq: Three times a day (TID) | ORAL | Status: DC
  Administered 2012-10-30 – 2012-11-01 (×8): 325 mg via ORAL
  Filled 2012-10-30 (×10): qty 1

## 2012-10-30 MED ORDER — MORPHINE SULFATE 4 MG/ML IJ SOLN
4.0000 mg | Freq: Once | INTRAMUSCULAR | Status: AC
  Administered 2012-10-30: 4 mg via INTRAVENOUS
  Filled 2012-10-30: qty 1

## 2012-10-30 MED ORDER — NITROGLYCERIN IN D5W 200-5 MCG/ML-% IV SOLN
2.0000 ug/min | Freq: Once | INTRAVENOUS | Status: AC
  Administered 2012-10-30: 5 ug via INTRAVENOUS

## 2012-10-30 MED ORDER — AMLODIPINE BESYLATE 5 MG PO TABS
5.0000 mg | ORAL_TABLET | Freq: Two times a day (BID) | ORAL | Status: DC
  Administered 2012-10-30 – 2012-10-31 (×3): 5 mg via ORAL
  Filled 2012-10-30 (×7): qty 1

## 2012-10-30 MED ORDER — FINASTERIDE 5 MG PO TABS
5.0000 mg | ORAL_TABLET | Freq: Every day | ORAL | Status: DC
  Administered 2012-10-30 – 2012-11-01 (×3): 5 mg via ORAL
  Filled 2012-10-30 (×3): qty 1

## 2012-10-30 MED ORDER — ASPIRIN EC 81 MG PO TBEC
81.0000 mg | DELAYED_RELEASE_TABLET | Freq: Every day | ORAL | Status: DC
  Administered 2012-10-30 – 2012-10-31 (×2): 81 mg via ORAL
  Filled 2012-10-30 (×2): qty 1

## 2012-10-30 MED ORDER — ATORVASTATIN CALCIUM 10 MG PO TABS
10.0000 mg | ORAL_TABLET | Freq: Every day | ORAL | Status: DC
  Administered 2012-10-30 – 2012-10-31 (×2): 10 mg via ORAL
  Filled 2012-10-30 (×3): qty 1

## 2012-10-30 MED ORDER — DIAZEPAM 2 MG PO TABS: 2.0000 mg | ORAL_TABLET | Freq: Three times a day (TID) | ORAL | Status: DC | PRN

## 2012-10-30 MED ORDER — TERAZOSIN HCL 1 MG PO CAPS
1.0000 mg | ORAL_CAPSULE | Freq: Every day | ORAL | Status: DC
  Administered 2012-10-30: 1 mg via ORAL
  Filled 2012-10-30 (×4): qty 1

## 2012-10-30 MED ORDER — HEPARIN (PORCINE) IN NACL 100-0.45 UNIT/ML-% IJ SOLN
1300.0000 [IU]/h | INTRAMUSCULAR | Status: DC
  Filled 2012-10-30: qty 250

## 2012-10-30 NOTE — ED Notes (Signed)
Consulting MD at bedside

## 2012-10-30 NOTE — ED Notes (Signed)
Transported to 2900 via stretcher with portable monitor

## 2012-10-30 NOTE — Progress Notes (Signed)
10/30/12 0000  Clinical Encounter Type  Visited With Patient and family together  Visit Type Code;ED   Provided emotional support for wife while waiting on news about her husband's condition. The Code-stemi status was cancelled. Veryl Speak

## 2012-10-30 NOTE — H&P (Signed)
[facility]    ADMISSION HISTORY & PHYSICAL   Chief Complaint:  Recurrent Angina  Cardiologist: Runell Gess  Primary Care Physician: Provider Not In System  HPI: This is a very unfortunate 66 y.o. male, who is well known to our service due to several recent admissions similar to this current episode for acute coronary syndrome.   he is a history of an anterior "non-STEMI" in January 2013 was found to have severe ostial left main disease as well as right coronary artery disease and underwent CABG x3. He did well up until late October where he read presented with a non-ST elevation MI and was found to have an occluded vein graft to the OM. After a washout period for antiplatelet agents, he underwent redo CABG with a vein graft to the OM on November 14th.  This he has had 3 admissions for acute coronary syndrome and abnormal ECG is with mild troponin elevation of the first one incessantly not isolation rise to 2 onset next 2 admissions. Of both the first 2 occasions he did undergo cardiac catheterization which revealed patent grafts with no clear culprit lesion. His last admission was notable for profound for 5 mm ST depressions in the inferior and lateral leads, but was not taken to the Cath Lab as his EKG had notably better and symptoms resolved with nitroglycerin and morphine emergency room. He says will he underwent a thallium rest stress and redistribution study demonstrating essentially anterior and anterolateral scar with minimal reversibility. The presumed diagnosis was that of spasm potentially in relating the LAD distribution versus possible option and LAD disease not adequately revascularized as he has an occluded native left main and severe mid LAD disease inhibiting retrograde flow to the proximal LAD branches of a septal perforator and diagonal branch.  With each admission his medications have been adjusted, the last discharge was 5 days ago on twice a  day Ranexa and 5 mg amlodipine as well as Imdur 60 mg. His EKG although resolved and was ambulating in the without difficulty prior to discharge. He has been under significant amount of emotional stress due to his recurrent episodes as they both feel in the the symptoms began to come on he was doing relatively well until roughly 2:00 in the middle 24th, he had a pretty significant episode of in nausea and emesis after taking medications on into stomach. His symptoms then began to rise with a sense of palpitations in his chest and dyspnea that was followed by a chest pressure that radiated to his jaw.  The symptom was not as severe as it was on the most recent hospitalization this time, being 8/10 versus 10/10.  He had minimal relief with nitroglycerin, but continued to deny the pain was getting worse.  He was finally that his symptoms would not improve, so he reluctantly came to the emergency room tonight with his wife and daughter. Upon arrival his symptoms did get better initially with nitroglycerin, but have not fully subsided until he received morphine IV. As was the case during his last admission, his presenting ECG was abnormal, albeit not as dramatic: Sinus rhythm with a roughly 2 mm ST depressions in 2 and aVF, 2 mm elevations in aVR, less than 1 mm depressions in the lateral precordial leads. Due to concern for the abnormal ECG in the patient's extensive history, I felt it appropriate for the patient admitted directly to the cardiology service for management.  PMHx:  Past Medical History  Diagnosis Date  . Hypertension   . Hypercholesterolemia   . Anginal pain -- with 3 ACS/non-STEMI admission since redo CABG on November 14.   . DDD (degenerative disc disease), cervical   . Arthritis     "in my back and neck" (September 26, 2012)  . Malaria     "I've had it a couple times in the 1960's; almost died from it" (09/26/2012)  . PTSD (post-traumatic stress disorder)   . NSTEMI (non-ST elevated myocardial  infarction) 11/2011    anterior  . NSTEMI (non-ST elevated myocardial infarction) 2012-09-26  . Coronary artery disease   . CAD, CABG X 09 Nov 2011. re-do CABG X1 09/19/12 12/04/2011    Past Surgical History  Procedure Date  . Back surgery   . Lumbar disc surgery 1980's?  . Tonsillectomy     "as a kid" (09-26-2012)  . Coronary artery bypass graft 12/02/2011    Procedure: CORONARY ARTERY BYPASS GRAFTING (CABG);  Surgeon: Alleen Borne, MD;  Location: [facility] OR;  Service: Open Heart Surgery;  Laterality: N/A;  . Cardiac catheterization 11/2011  . Cataract extraction w/ intraocular lens  implant, bilateral 1990's  . Coronary artery bypass graft 09/19/2012    Procedure: REDO CORONARY ARTERY BYPASS GRAFTING (CABG);  Surgeon: Alleen Borne, MD;  Location: [facility] OR;  Service: Open Heart Surgery;  Laterality: N/A;    FAMHx:  History reviewed. No pertinent family history.  SOCHx:  he is a former Teacher, music, with several chores and it. He did have some Agent Orange exposure.   reports that he quit smoking about 10 months ago. His smoking use included Cigarettes. He has a 25 pack-year smoking history. He has never used smokeless tobacco. He reports that he drinks alcohol. He reports that he does not use illicit drugs.  ALLERGIES:  No Known Allergies  ROS: A comprehensive review of systems was negative except for: Cardiovascular: positive for chest pressure/discomfort, irregular heart beat, orthopnea and Dyspnea Gastrointestinal: positive for nausea, vomiting and Usually related to taking medications without eating Genitourinary: positive for frequency Behavioral/Psych: positive for anxiety and Likely PTSD related symptoms, living in fear of recurrent ACS episodes; very poor sleep -less than 4 hours a night on average.  chronic back pain   HOME MEDS: acetaminophen 325 MG tablet   Commonly known as: TYLENOL   Take 2 tablets (650 mg total) by mouth every 4 (four) hours as needed.      amLODipine 5 MG tablet    Commonly known as: NORVASC    Take 1 tablet (5 mg total) by mouth daily.     aspirin 81 MG EC tablet    Take 1 tablet (81 mg total) by mouth daily.     atorvastatin 10 MG tablet    Commonly known as: LIPITOR    Take 10 mg by mouth daily.     carvedilol 3.125 MG tablet    Commonly known as: COREG    Take 1 tablet (3.125 mg total) by mouth 2 (two) times daily with a meal.     clopidogrel 75 MG tablet    Commonly known as: PLAVIX    Take 1 tablet (75 mg total) by mouth daily with breakfast.     ferrous sulfate 325 (65 FE) MG tablet    Take 1 tablet (325 mg total) by mouth 3 (three) times daily with meals.     finasteride 5 MG tablet    Commonly known as: PROSCAR    Take 5 mg by mouth daily.  HYDROcodone-acetaminophen 10-325 MG per tablet    Commonly known as: NORCO    Take 1 tablet by mouth every 6 (six) hours as needed for pain.     ibuprofen 600 MG tablet    Commonly known as: ADVIL,MOTRIN    Take 1 tablet (600 mg total) by mouth 3 (three) times daily with meals.     isosorbide mononitrate 60 MG 24 hr tablet    Commonly known as: IMDUR    Take 1 tablet (60 mg total) by mouth daily.     nitroGLYCERIN 0.4 MG SL tablet    Commonly known as: NITROSTAT    Place 1 tablet (0.4 mg total) under the tongue every 5 (five) minutes x 3 doses as needed for chest pain.     omeprazole 20 MG capsule    Commonly known as: PRILOSEC    Take 20 mg by mouth 2 (two) times daily.     ranolazine 1000 MG SR tablet    Commonly known as: RANEXA    Take 1 tablet (1,000 mg total) by mouth 2 (two) times daily.     terazosin 1 MG capsule    Commonly known as: HYTRIN    Take 1 capsule (1 mg total) by mouth at bedtime.    IMAGING: Results for orders placed during the hospital encounter of 10/30/12 (from the past 48 hour(s))  CBC WITH DIFFERENTIAL     Status: Abnormal   Collection Time   10/30/12 12:20 AM      Component Value Range Comment   WBC 8.3  4.0 - 10.5  K/uL    RBC 3.95 (*) 4.22 - 5.81 MIL/uL    Hemoglobin 10.5 (*) 13.0 - 17.0 g/dL    HCT 54.0 (*) 98.1 - 52.0 %    MCV 84.1  78.0 - 100.0 fL    MCH 26.6  26.0 - 34.0 pg    MCHC 31.6  30.0 - 36.0 g/dL    RDW 19.1 (*) 47.8 - 15.5 %    Platelets 269  150 - 400 K/uL    Neutrophils Relative 65  43 - 77 %    Neutro Abs 5.4  1.7 - 7.7 K/uL    Lymphocytes Relative 24  12 - 46 %    Lymphs Abs 2.0  0.7 - 4.0 K/uL    Monocytes Relative 10  3 - 12 %    Monocytes Absolute 0.9  0.1 - 1.0 K/uL    Eosinophils Relative 1  0 - 5 %    Eosinophils Absolute 0.1  0.0 - 0.7 K/uL    Basophils Relative 0  0 - 1 %    Basophils Absolute 0.0  0.0 - 0.1 K/uL   COMPREHENSIVE METABOLIC PANEL     Status: Abnormal   Collection Time   10/30/12 12:20 AM      Component Value Range Comment   Sodium 142  135 - 145 mEq/L    Potassium 3.5  3.5 - 5.1 mEq/L    Chloride 109  96 - 112 mEq/L    CO2 20  19 - 32 mEq/L    Glucose, Bld 88  70 - 99 mg/dL    BUN 21  6 - 23 mg/dL    Creatinine, Ser 2.95  0.50 - 1.35 mg/dL    Calcium 9.2  8.4 - 62.1 mg/dL    Total Protein 7.0  6.0 - 8.3 g/dL    Albumin 3.3 (*) 3.5 - 5.2 g/dL    AST 13  0 - 37 U/L    ALT 14  0 - 53 U/L    Alkaline Phosphatase 72  39 - 117 U/L    Total Bilirubin 0.2 (*) 0.3 - 1.2 mg/dL    GFR calc non Af Amer 67 (*) >90 mL/min    GFR calc Af Amer 78 (*) >90 mL/min   TROPONIN I     Status: Normal   Collection Time   10/30/12 12:20 AM      Component Value Range Comment   Troponin I <0.30  <0.30 ng/mL   POCT I-STAT TROPONIN I     Status: Normal   Collection Time   10/30/12 12:22 AM      Component Value Range Comment   Troponin i, poc 0.01  0.00 - 0.08 ng/mL    Comment 3            POCT I-STAT, CHEM 8     Status: Abnormal   Collection Time   10/30/12 12:24 AM      Component Value Range Comment   Sodium 144  135 - 145 mEq/L    Potassium 3.5  3.5 - 5.1 mEq/L    Chloride 112  96 - 112 mEq/L    BUN 20  6 - 23 mg/dL    Creatinine, Ser 1.61  0.50 - 1.35  mg/dL    Glucose, Bld 88  70 - 99 mg/dL    Calcium, Ion 0.96  0.45 - 1.30 mmol/L    TCO2 19  0 - 100 mmol/L    Hemoglobin 11.2 (*) 13.0 - 17.0 g/dL    HCT 40.9 (*) 81.1 - 52.0 %   POCT I-STAT TROPONIN I     Status: Normal   Collection Time   10/30/12  2:11 AM      Component Value Range Comment   Troponin i, poc 0.02  0.00 - 0.08 ng/mL    Comment 3             Dg Chest Port 1 View  10/30/2012  *RADIOLOGY REPORT*  Clinical Data: Chest pain, history coronary disease post CABG, hypertension  PORTABLE CHEST - 1 VIEW  Comparison: Portable exam 0041 hours compared to 10/20/2012  Findings: Enlargement of cardiac silhouette post CABG. Mediastinal contours and pulmonary vascularity normal. Atherosclerotic calcification aortic arch. Minimal chronic accentuation of perihilar and basilar markings. No acute failure or consolidation. No pleural effusion or pneumothorax. Bones unremarkable.  IMPRESSION: Enlargement of cardiac silhouette post CABG. No acute abnormalities.   Original Report Authenticated By: Ulyses Southward, M.D.     VITALS: Blood pressure 126/74, pulse 83, temperature 97.7 F (36.5 C), resp. rate 14, height 5\' 10"  (1.778 m), weight 81.647 kg (180 lb), SpO2 100.00%.  EXAM: General appearance: alert, cooperative, appears stated age, mild distress and Depressed/dejected appearing; sitting upright in bed Neck: no adenopathy, no carotid bruit, no JVD, supple, symmetrical, trachea midline and thyroid not enlarged, symmetric, no tenderness/mass/nodules Lungs: clear to auscultation bilaterally, normal percussion bilaterally and Nonlabored Heart: regular rate and rhythm, S1, S2 normal, no murmur, click, rub or gallop Abdomen: soft, non-tender; bowel sounds normal; no masses,  no organomegaly Extremities: Normal atraumatic, no clubbing or cyanosis. 1-2+ pitting to bilateral ankles to mid calf Pulses: 2+ and symmetric Skin: Skin color, texture, turgor normal. No rashes or lesions or Mild ecchymoses  from recent blood draws Neurologic: Alert and oriented X 3, normal strength and tone. Normal symmetric reflexes. Normal coordination and gait  IMPRESSION: Principal Problem:  *Acute coronary syndrome  Active Problems:  S/P CABG x 3: LIMA TO LAD, SVG TO OM, SVG TO RCA., VG to OM occluded, jeopardized LCX, by cath 08/29/12 -- Re-Do CABG with SVG-OM (09/2012)  Unstable angina, on appropriate medical therapy, re-do CABG 09/19/12 X1 with SVG-OM 09/19/12  HTN (hypertension)  ICM, EF 40% Jan 2013,  50-55% Oct 2013,  40-45% with apical infarct 10/18/12  Dyslipidemia  Anxiety as acute reaction to exceptional stress  BPH (benign prostatic hyperplasia)  Abnormal EKG, marked diffuse downsloping ST depression,ST elevation AVR and V1, 10/20/12  This is a very distressing situation would now he get a fourth admission for Acute Coronary Syndrome symptoms in less than a month, and only a month and a half post redo CABG. He has had 2 catheterizations showing no obvious culprit lesion for resting angina, and has had a thallium rest stress and redistribution study showing no evidence of ischemia with only anterior and anterolateral scar. I do not feel he requires urgent cardiac catheterization this current time. But I will put him on IV heparin and nitroglycerin and noted to get him completely pain-free. I will provide when necessary morphine for additional pain relief.  There is certainly some component of anxiety, as expected in this situation.   I will provide when necessary and Valium for sedation and also to help him sleep as he said to poor sleep recently.   Increase amlodipine to 5 mg twice a day, hold the Imdur until off the nitroglycerin drip which we're currently weaning off tomorrow.  We will continue to cycle cardiac markers and EKGs. Already on telemetry the EKG changes are. To look less pronounced as his symptoms improve.  He has no active CHF symptoms nor does he have any findings on chest  x-ray to suggest that there is actually any significant CHF. Therefore we'll hold off on any aggressive diuresis.  I will discuss his case with Dr. Allyson Sabal in the morning my considerations of this patient. The difficult to elicit as to whether or not we yet again to back to the cardiac catheterization lab to look for a culprit lesion. The EKGs on this occasion as the were the last occasion were different than the first 2. Perhaps another catheterization is warranted if there are further options to be pursued. Unfortunately, there is unlikely to be any percutaneous options.  I did spend a little time talking the patient about the possibility of considering transfer to a different facility for treatment modalities such as [facility], or even possible consideration for transplant evaluation given his recurrent intractable anginal admissions. I will defer judgment as to the immediate plans to Dr. Allyson Sabal who will see him today while rounding.  He will be on IV heparin and PPI for prophylaxis. He is on aspirin Plavix beta blocker Ranexa as well as statin for cardiac care.   Marykay Lex, M.D., M.S. [facility] [address] [place], [place]  [address]  [phone] Pager # [phone] 10/30/2012 4:31 AM

## 2012-10-30 NOTE — Progress Notes (Signed)
ANTICOAGULATION CONSULT NOTE - Initial Consult  Pharmacy Consult for heparin Indication: chest pain/ACS  No Known Allergies  Patient Measurements: Height: 5\' 10"  (177.8 cm) Weight: 180 lb (81.647 kg) IBW/kg (Calculated) : 73   Vital Signs: Temp: 97.9 F (36.6 C) (12/25 0800) Temp src: Oral (12/25 0800) BP: 115/61 mmHg (12/25 1000) Pulse Rate: 76  (12/25 1000)  Labs:  Basename 10/30/12 0919 10/30/12 0024 10/30/12 0020  HGB -- 11.2* 10.5*  HCT -- 33.0* 33.2*  PLT -- -- 269  APTT -- -- --  LABPROT -- -- --  INR -- -- --  HEPARINUNFRC 0.73* -- --  CREATININE -- 1.10 1.12  CKTOTAL -- -- --  CKMB -- -- --  TROPONINI -- -- <0.30    Estimated Creatinine Clearance: 69.1 ml/min (by C-G formula based on Cr of 1.1).   Scheduled:     . amLODipine  5 mg Oral BID  . aspirin EC  81 mg Oral Daily  . atorvastatin  10 mg Oral q1800  . carvedilol  3.125 mg Oral BID WC  . clopidogrel  75 mg Oral Q breakfast  . ferrous sulfate  325 mg Oral TID WC  . finasteride  5 mg Oral Daily  . [COMPLETED] heparin  4,000 Units Intravenous Once  . [COMPLETED]  morphine injection  4 mg Intravenous Once  . [COMPLETED]  morphine injection  4 mg Intravenous Once  . [COMPLETED] nitroGLYCERIN  2-200 mcg/min Intravenous Once  . [COMPLETED] ondansetron (ZOFRAN) IV  4 mg Intravenous Once  . pantoprazole  40 mg Oral Daily  . ranolazine  1,000 mg Oral BID  . [COMPLETED] sodium chloride  500 mL Intravenous Once  . terazosin  1 mg Oral QHS   Infusions:     . heparin 1,300 Units/hr (10/30/12 0550)  . nitroGLYCERIN 25 mcg/min (10/30/12 0550)  . [DISCONTINUED] heparin 16 Units/kg/hr (10/30/12 0132)    Assessment: 66yo male with complex cardiac hx including recent CABG redo and two subsequent cardiac caths, c/o CP radiating to jaw, initially called as code STEMI though cancelled upon arrival to ED.  Patient on heparin and intiall level is above goal (HL=0.73).   Goal of Therapy:  Heparin level  0.3-0.7 units/ml Monitor platelets by anticoagulation protocol: Yes   Plan:   -Decrease heparin to 1200 units/hr -Heparin level in 6 hours -CBC daily  Harland German, Pharm D 10/30/2012 10:18 AM

## 2012-10-30 NOTE — ED Notes (Addendum)
Per EMS, pt started having some chest pain (tightness) that radiated to his jaw; pain initially 9/10; pt has taken 5 nitros of 3 at home and 2 with EMS; pt also had 324 ASA as well as 6mg  of morphine; pt has a cardiac history and was recently cath; STEMI called in the field;

## 2012-10-30 NOTE — ED Provider Notes (Signed)
History     CSN: [id number]  Arrival date & time 10/30/12  0013   First MD Initiated Contact with Patient 10/30/12 0015      Chief Complaint  Patient presents with  . Chest Pain    (Consider location/radiation/quality/duration/timing/severity/associated sxs/prior treatment) HPI Pt with substernal chest pain radiating to bl jaw similar to past MI. Sensation started at 1400 12/24. Pt given 6 morphine and ntg x 3 with aspirin en route. Pain now 3/10. EMS activated code STEMI.  Past Medical History  Diagnosis Date  . Hypertension   . Hypercholesterolemia   . Anginal pain   . DDD (degenerative disc disease), cervical   . Arthritis     "in my back and neck" (2012-09-11)  . Malaria     "I've had it a couple times in the 1960's; almost died from it" (09/11/2012)  . PTSD (post-traumatic stress disorder)   . NSTEMI (non-ST elevated myocardial infarction) 11/2011    anterior  . NSTEMI (non-ST elevated myocardial infarction) 09/11/12  . Coronary artery disease   . CAD, CABG X 09 Nov 2011. re-do CABG X1 09/19/12 12/04/2011    Past Surgical History  Procedure Date  . Back surgery   . Lumbar disc surgery 1980's?  . Tonsillectomy     "as a kid" (11-Sep-2012)  . Coronary artery bypass graft 12/02/2011    Procedure: CORONARY ARTERY BYPASS GRAFTING (CABG);  Surgeon: Alleen Borne, MD;  Location: [facility] OR;  Service: Open Heart Surgery;  Laterality: N/A;  . Cardiac catheterization 11/2011  . Cataract extraction w/ intraocular lens  implant, bilateral 1990's  . Coronary artery bypass graft 09/19/2012    Procedure: REDO CORONARY ARTERY BYPASS GRAFTING (CABG);  Surgeon: Alleen Borne, MD;  Location: [facility] OR;  Service: Open Heart Surgery;  Laterality: N/A;    History reviewed. No pertinent family history.  History  Substance Use Topics  . Smoking status: Former Smoker -- 0.5 packs/day for 50 years    Types: Cigarettes    Quit date: 12/02/2011  . Smokeless tobacco: Never Used  . Alcohol Use:  Yes     Comment: September 11, 2012 "haven't had a drink in > 25 years; never had problem w/it"      Review of Systems  Constitutional: Negative for fever and chills.  Respiratory: Negative for shortness of breath.   Cardiovascular: Positive for chest pain. Negative for palpitations and leg swelling.  Gastrointestinal: Positive for nausea and vomiting. Negative for abdominal pain and diarrhea.  Skin: Negative for rash and wound.  Neurological: Negative for weakness, numbness and headaches.    Allergies  Review of patient's allergies indicates no known allergies.  Home Medications   Current Outpatient Rx  Name  Route  Sig  Dispense  Refill  . ACETAMINOPHEN 325 MG PO TABS   Oral   Take 2 tablets (650 mg total) by mouth every 4 (four) hours as needed.         Marland Kitchen AMLODIPINE BESYLATE 5 MG PO TABS   Oral   Take 1 tablet (5 mg total) by mouth daily.   30 tablet   5   . ASPIRIN 81 MG PO TBEC   Oral   Take 1 tablet (81 mg total) by mouth daily.         . ATORVASTATIN CALCIUM 10 MG PO TABS   Oral   Take 10 mg by mouth daily.         Marland Kitchen CARVEDILOL 3.125 MG PO TABS   Oral  Take 1 tablet (3.125 mg total) by mouth 2 (two) times daily with a meal.   60 tablet   5   . CLOPIDOGREL BISULFATE 75 MG PO TABS   Oral   Take 1 tablet (75 mg total) by mouth daily with breakfast.   30 tablet   11   . FERROUS SULFATE 325 (65 FE) MG PO TABS   Oral   Take 1 tablet (325 mg total) by mouth 3 (three) times daily with meals.   30 tablet   5   . FINASTERIDE 5 MG PO TABS   Oral   Take 5 mg by mouth daily.         Marland Kitchen HYDROCODONE-ACETAMINOPHEN 10-325 MG PO TABS   Oral   Take 1 tablet by mouth every 6 (six) hours as needed for pain.   30 tablet   0   . IBUPROFEN 600 MG PO TABS   Oral   Take 1 tablet (600 mg total) by mouth 3 (three) times daily with meals.   30 tablet   0     Stop 11/30/11   . ISOSORBIDE MONONITRATE ER 60 MG PO TB24   Oral   Take 1 tablet (60 mg total) by mouth  daily.   30 tablet   5   . NITROGLYCERIN 0.4 MG SL SUBL   Sublingual   Place 1 tablet (0.4 mg total) under the tongue every 5 (five) minutes x 3 doses as needed for chest pain.   25 tablet   5   . OMEPRAZOLE 20 MG PO CPDR   Oral   Take 20 mg by mouth 2 (two) times daily.         Marland Kitchen RANOLAZINE ER 1000 MG PO TB12   Oral   Take 1 tablet (1,000 mg total) by mouth 2 (two) times daily.   60 tablet   11   . TERAZOSIN HCL 1 MG PO CAPS   Oral   Take 1 capsule (1 mg total) by mouth at bedtime.   30 capsule   5     BP 126/74  Pulse 83  Temp 97.7 F (36.5 C)  Resp 14  Ht 5\' 10"  (1.778 m)  Wt 180 lb (81.647 kg)  BMI 25.83 kg/m2  SpO2 100%  Physical Exam  Nursing note and vitals reviewed. Constitutional: He is oriented to person, place, and time. He appears well-developed and well-nourished. No distress.  HENT:  Head: Normocephalic and atraumatic.  Mouth/Throat: Oropharynx is clear and moist.  Eyes: EOM are normal. Pupils are equal, round, and reactive to light.  Neck: Normal range of motion. Neck supple.  Cardiovascular: Normal rate and regular rhythm.   Pulmonary/Chest: Effort normal and breath sounds normal. No respiratory distress. He has no wheezes. He has no rales. He exhibits no tenderness.  Abdominal: Soft. Bowel sounds are normal. He exhibits no distension and no mass. There is no tenderness. There is no rebound and no guarding.  Musculoskeletal: Normal range of motion. He exhibits no edema and no tenderness.       1+ bl lower ext swelling. No tenderness  Neurological: He is alert and oriented to person, place, and time.       Moves all ext, sensation intact  Skin: Skin is warm and dry. No rash noted. No erythema.  Psychiatric: He has a normal mood and affect. His behavior is normal.    ED Course  Procedures (including critical care time)  Labs Reviewed  CBC WITH DIFFERENTIAL -  Abnormal; Notable for the following:    RBC 3.95 (*)     Hemoglobin 10.5 (*)      HCT 33.2 (*)     RDW 17.3 (*)     All other components within normal limits  COMPREHENSIVE METABOLIC PANEL - Abnormal; Notable for the following:    Albumin 3.3 (*)     Total Bilirubin 0.2 (*)     GFR calc non Af Amer 67 (*)     GFR calc Af Amer 78 (*)     All other components within normal limits  POCT I-STAT, CHEM 8 - Abnormal; Notable for the following:    Hemoglobin 11.2 (*)     HCT 33.0 (*)     All other components within normal limits  TROPONIN I  POCT I-STAT TROPONIN I  POCT I-STAT TROPONIN I   Dg Chest Port 1 View  10/30/2012  *RADIOLOGY REPORT*  Clinical Data: Chest pain, history coronary disease post CABG, hypertension  PORTABLE CHEST - 1 VIEW  Comparison: Portable exam 0041 hours compared to 10/20/2012  Findings: Enlargement of cardiac silhouette post CABG. Mediastinal contours and pulmonary vascularity normal. Atherosclerotic calcification aortic arch. Minimal chronic accentuation of perihilar and basilar markings. No acute failure or consolidation. No pleural effusion or pneumothorax. Bones unremarkable.  IMPRESSION: Enlargement of cardiac silhouette post CABG. No acute abnormalities.   Original Report Authenticated By: Ulyses Southward, M.D.    CRITICAL CARE Performed by: Ranae Palms, Massiel Stipp   Total critical care time: 20  Critical care time was exclusive of separately billable procedures and treating other patients.  Critical care was necessary to treat or prevent imminent or life-threatening deterioration.  Critical care was time spent personally by me on the following activities: development of treatment plan with patient and/or surrogate as well as nursing, discussions with consultants, evaluation of patient's response to treatment, examination of patient, obtaining history from patient or surrogate, ordering and performing treatments and interventions, ordering and review of laboratory studies, ordering and review of radiographic studies, pulse oximetry and re-evaluation of  patient's condition.     Date: 10/30/2012  Rate: 85  Rhythm: normal sinus rhythm  QRS Axis: normal  Intervals: normal  ST/T Wave abnormalities: ST depressions inferiorly, ST depressions laterally and ST seg elevation aVR  Conduction Disutrbances:none  Narrative Interpretation:   Old EKG Reviewed: unchanged  Similar EKG 10/27/12  MDM  Dr Milas Kocher met pt in ED. Reviewed EKG and most recent cath. Called off code STEMI.    Discussed with Dr Herbie Baltimore. Advised NTG gtt and heparin. Dr Herbie Baltimore in ED eval pt. Will admit to step down     Loren Racer, MD 10/30/12 [id number]

## 2012-10-30 NOTE — Progress Notes (Signed)
ANTICOAGULATION CONSULT NOTE - Follow Up Consult  Pharmacy Consult for Heparin Indication: chest pain/ACS  No Known Allergies  Patient Measurements: Height: 5\' 10"  (177.8 cm) Weight: 180 lb (81.647 kg) IBW/kg (Calculated) : 73   Vital Signs: Temp: 98 F (36.7 C) (12/25 1600) Temp src: Oral (12/25 1600) BP: 96/49 mmHg (12/25 1600) Pulse Rate: 68  (12/25 1600)  Labs:  Basename 10/30/12 1655 10/30/12 1245 10/30/12 0919 10/30/12 0024 10/30/12 0020  HGB -- -- -- 11.2* 10.5*  HCT -- -- -- 33.0* 33.2*  PLT -- -- -- -- 269  APTT -- -- -- -- --  LABPROT -- -- -- -- --  INR -- -- -- -- --  HEPARINUNFRC 0.49 -- 0.73* -- --  CREATININE -- -- -- 1.10 1.12  CKTOTAL -- -- -- -- --  CKMB -- -- -- -- --  TROPONINI 1.58* 1.28* 1.24* -- --    Estimated Creatinine Clearance: 69.1 ml/min (by C-G formula based on Cr of 1.1).   Assessment: Pt is a 66 y/o M with complex cardiac history including recent CABG redo and two subsequent cardiac caths, with new c/o CP radiating to jaw, initially code STEMI, cancelled upon arrival to ED. HL at 1655 on 1200 units/hr in 0.49. Will continue current heparin infusion rate and assess AM heparin level. Troponin 1.58<1.28<1.24. Scr 1.1, CrCl ~ 65-70 mL/min. No current bleeding noted.   Goal of Therapy:  Heparin level 0.3-0.7 units/ml Monitor platelets by anticoagulation protocol: Yes   Plan:  - Continue heparin drip at 1200 units/hr - Daily CBC/HL - Monitor for bleeding - f/u Cardiology plans  Abran Duke, PharmD Clinical Pharmacist Phone: [phone] Pager: [phone] 10/30/2012 6:26 PM

## 2012-10-30 NOTE — Progress Notes (Signed)
ANTICOAGULATION CONSULT NOTE - Initial Consult  Pharmacy Consult for heparin Indication: chest pain/ACS  No Known Allergies  Patient Measurements: Height: 5\' 10"  (177.8 cm) Weight: 180 lb (81.647 kg) IBW/kg (Calculated) : 73   Vital Signs: Temp: 98.2 F (36.8 C) (12/25 0520) Temp src: Oral (12/25 0520) BP: 107/61 mmHg (12/25 0520) Pulse Rate: 75  (12/25 0520)  Labs:  Basename 10/30/12 0024 10/30/12 0020  HGB 11.2* 10.5*  HCT 33.0* 33.2*  PLT -- 269  APTT -- --  LABPROT -- --  INR -- --  HEPARINUNFRC -- --  CREATININE 1.10 1.12  CKTOTAL -- --  CKMB -- --  TROPONINI -- <0.30    Estimated Creatinine Clearance: 69.1 ml/min (by C-G formula based on Cr of 1.1).   Medical History: Past Medical History  Diagnosis Date  . Hypertension   . Hypercholesterolemia   . Anginal pain   . DDD (degenerative disc disease), cervical   . Arthritis     "in my back and neck" (18-Sep-2012)  . Malaria     "I've had it a couple times in the 1960's; almost died from it" (09/18/12)  . PTSD (post-traumatic stress disorder)   . NSTEMI (non-ST elevated myocardial infarction) 11/2011    anterior  . NSTEMI (non-ST elevated myocardial infarction) 09/18/12  . Coronary artery disease   . CAD, CABG X 09 Nov 2011. re-do CABG X1 09/19/12 12/04/2011    Medications:  Prescriptions prior to admission  Medication Sig Dispense Refill  . acetaminophen (TYLENOL) 325 MG tablet Take 2 tablets (650 mg total) by mouth every 4 (four) hours as needed.      Marland Kitchen amLODipine (NORVASC) 5 MG tablet Take 1 tablet (5 mg total) by mouth daily.  30 tablet  5  . aspirin EC 81 MG EC tablet Take 1 tablet (81 mg total) by mouth daily.      Marland Kitchen atorvastatin (LIPITOR) 10 MG tablet Take 10 mg by mouth daily.      . carvedilol (COREG) 3.125 MG tablet Take 1 tablet (3.125 mg total) by mouth 2 (two) times daily with a meal.  60 tablet  5  . clopidogrel (PLAVIX) 75 MG tablet Take 1 tablet (75 mg total) by mouth daily with  breakfast.  30 tablet  11  . ferrous sulfate 325 (65 FE) MG tablet Take 1 tablet (325 mg total) by mouth 3 (three) times daily with meals.  30 tablet  5  . finasteride (PROSCAR) 5 MG tablet Take 5 mg by mouth daily.      Marland Kitchen HYDROcodone-acetaminophen (NORCO) 10-325 MG per tablet Take 1 tablet by mouth every 6 (six) hours as needed for pain.  30 tablet  0  . ibuprofen (ADVIL,MOTRIN) 600 MG tablet Take 1 tablet (600 mg total) by mouth 3 (three) times daily with meals.  30 tablet  0  . isosorbide mononitrate (IMDUR) 60 MG 24 hr tablet Take 1 tablet (60 mg total) by mouth daily.  30 tablet  5  . nitroGLYCERIN (NITROSTAT) 0.4 MG SL tablet Place 1 tablet (0.4 mg total) under the tongue every 5 (five) minutes x 3 doses as needed for chest pain.  25 tablet  5  . omeprazole (PRILOSEC) 20 MG capsule Take 20 mg by mouth 2 (two) times daily.      . ranolazine (RANEXA) 1000 MG SR tablet Take 1 tablet (1,000 mg total) by mouth 2 (two) times daily.  60 tablet  11  . terazosin (HYTRIN) 1 MG capsule Take 1 capsule (  1 mg total) by mouth at bedtime.  30 capsule  5   Scheduled:    . amLODipine  5 mg Oral BID  . aspirin EC  81 mg Oral Daily  . atorvastatin  10 mg Oral q1800  . carvedilol  3.125 mg Oral BID WC  . clopidogrel  75 mg Oral Q breakfast  . ferrous sulfate  325 mg Oral TID WC  . finasteride  5 mg Oral Daily  . [COMPLETED] heparin  4,000 Units Intravenous Once  . [COMPLETED]  morphine injection  4 mg Intravenous Once  . [COMPLETED]  morphine injection  4 mg Intravenous Once  . [COMPLETED] nitroGLYCERIN  2-200 mcg/min Intravenous Once  . [COMPLETED] ondansetron (ZOFRAN) IV  4 mg Intravenous Once  . pantoprazole  40 mg Oral Daily  . ranolazine  1,000 mg Oral BID  . [COMPLETED] sodium chloride  500 mL Intravenous Once  . terazosin  1 mg Oral QHS   Infusions:    . heparin    . nitroGLYCERIN    . [DISCONTINUED] heparin 16 Units/kg/hr (10/30/12 0132)    Assessment: 66yo male with complex cardiac hx  including recent CABG redo and two subsequent cardiac caths, c/o CP radiating to jaw, initially called as code STEMI though cancelled upon arrival to ED, to begin heparin while awaiting decisions Re: cath vs transfer to explore other options.  Goal of Therapy:  Heparin level 0.3-0.7 units/ml Monitor platelets by anticoagulation protocol: Yes   Plan:  EDP ordered heparin 4000 units x1 followed by gtt at 1300 units/hr; on recent admissions this rate was approximately therapeutic so will continue gtt at current rate and monitor heparin levels and CBC.  Colleen Can PharmD BCPS 10/30/2012,5:38 AM

## 2012-10-30 NOTE — Progress Notes (Signed)
Subjective:  Currently pain free on IV hep/ntg  Objective:  Temp:  [97.7 F (36.5 C)-98.2 F (36.8 C)] 97.9 F (36.6 C) (12/25 0800) Pulse Rate:  [70-90] 70  (12/25 0800) Resp:  [14-27] 16  (12/25 0700) BP: (97-128)/(54-77) 100/54 mmHg (12/25 0800) SpO2:  [95 %-100 %] 95 % (12/25 0800) Weight:  [81.647 kg (180 lb)] 81.647 kg (180 lb) (12/25 0022) Weight change:   Intake/Output from previous day: 12/24 0701 - 12/25 0700 In: 41 [I.V.:41] Out: 500 [Urine:500]  Intake/Output from this shift: Total I/O In: 20.5 [I.V.:20.5] Out: -   Physical Exam: General appearance: alert and cooperative Neck: no adenopathy, no carotid bruit, no JVD, supple, symmetrical, trachea midline and thyroid not enlarged, symmetric, no tenderness/mass/nodules Lungs: clear to auscultation bilaterally Heart: regular rate and rhythm, S1, S2 normal, no murmur, click, rub or gallop Extremities: extremities normal, atraumatic, no cyanosis or edema  Lab Results: Results for orders placed during the hospital encounter of 10/30/12 (from the past 48 hour(s))  CBC WITH DIFFERENTIAL     Status: Abnormal   Collection Time   10/30/12 12:20 AM      Component Value Range Comment   WBC 8.3  4.0 - 10.5 K/uL    RBC 3.95 (*) 4.22 - 5.81 MIL/uL    Hemoglobin 10.5 (*) 13.0 - 17.0 g/dL    HCT 95.6 (*) 38.7 - 52.0 %    MCV 84.1  78.0 - 100.0 fL    MCH 26.6  26.0 - 34.0 pg    MCHC 31.6  30.0 - 36.0 g/dL    RDW 56.4 (*) 33.2 - 15.5 %    Platelets 269  150 - 400 K/uL    Neutrophils Relative 65  43 - 77 %    Neutro Abs 5.4  1.7 - 7.7 K/uL    Lymphocytes Relative 24  12 - 46 %    Lymphs Abs 2.0  0.7 - 4.0 K/uL    Monocytes Relative 10  3 - 12 %    Monocytes Absolute 0.9  0.1 - 1.0 K/uL    Eosinophils Relative 1  0 - 5 %    Eosinophils Absolute 0.1  0.0 - 0.7 K/uL    Basophils Relative 0  0 - 1 %    Basophils Absolute 0.0  0.0 - 0.1 K/uL   COMPREHENSIVE METABOLIC PANEL     Status: Abnormal   Collection Time   10/30/12  12:20 AM      Component Value Range Comment   Sodium 142  135 - 145 mEq/L    Potassium 3.5  3.5 - 5.1 mEq/L    Chloride 109  96 - 112 mEq/L    CO2 20  19 - 32 mEq/L    Glucose, Bld 88  70 - 99 mg/dL    BUN 21  6 - 23 mg/dL    Creatinine, Ser 9.51  0.50 - 1.35 mg/dL    Calcium 9.2  8.4 - 88.4 mg/dL    Total Protein 7.0  6.0 - 8.3 g/dL    Albumin 3.3 (*) 3.5 - 5.2 g/dL    AST 13  0 - 37 U/L    ALT 14  0 - 53 U/L    Alkaline Phosphatase 72  39 - 117 U/L    Total Bilirubin 0.2 (*) 0.3 - 1.2 mg/dL    GFR calc non Af Amer 67 (*) >90 mL/min    GFR calc Af Amer 78 (*) >90 mL/min   TROPONIN I  Status: Normal   Collection Time   10/30/12 12:20 AM      Component Value Range Comment   Troponin I <0.30  <0.30 ng/mL   POCT I-STAT TROPONIN I     Status: Normal   Collection Time   10/30/12 12:22 AM      Component Value Range Comment   Troponin i, poc 0.01  0.00 - 0.08 ng/mL    Comment 3            POCT I-STAT, CHEM 8     Status: Abnormal   Collection Time   10/30/12 12:24 AM      Component Value Range Comment   Sodium 144  135 - 145 mEq/L    Potassium 3.5  3.5 - 5.1 mEq/L    Chloride 112  96 - 112 mEq/L    BUN 20  6 - 23 mg/dL    Creatinine, Ser 1.91  0.50 - 1.35 mg/dL    Glucose, Bld 88  70 - 99 mg/dL    Calcium, Ion 4.78  2.95 - 1.30 mmol/L    TCO2 19  0 - 100 mmol/L    Hemoglobin 11.2 (*) 13.0 - 17.0 g/dL    HCT 62.1 (*) 30.8 - 52.0 %   POCT I-STAT TROPONIN I     Status: Normal   Collection Time   10/30/12  2:11 AM      Component Value Range Comment   Troponin i, poc 0.02  0.00 - 0.08 ng/mL    Comment 3              Imaging: Imaging results have been reviewed  Assessment/Plan:   1. Principal Problem: 2.  *Acute coronary syndrome  3. Active Problems: 4.  S/P CABG x 3: LIMA TO LAD, SVG TO OM, SVG TO RCA., VG to OM occluded, jeopardized LCX, by cath 08/29/12 -- Re-Do CABG with SVG-OM (09/2012) 5.  Unstable angina, on appropriate medical therapy, re-do CABG 09/19/12 X1  with SVG-OM 09/19/12 6.  Abnormal EKG, marked diffuse downsloping ST depression,ST elevation AVR and V1, 10/20/12 7.  HTN (hypertension) 8.  ICM, EF 40% Jan 2013,  50-55% Oct 2013,  40-45% with apical infarct 10/18/12 9.  Dyslipidemia 10.  BPH (benign prostatic hyperplasia) 11.  Anxiety as acute reaction to exceptional stress 12.   Time Spent Directly with Patient:  20 minutes  Length of Stay:  LOS: 0 days   Pt well known to me. Multiple recurrent admissions for [facility]/ NSTEMI with EKG changes/ Pt has had re do CABG within last 6 weeks (SVG to LCX-OM) for total LM. He has been cathed 2 times since without a 'culprit lesion'. He has had a functional study w/o ischemia. I have personally reviewed his anatomy and can not determine a reason for his recurrent CP. Currently on IV hep/NTG and pain free. EKG this AM NSSSTW changes. Enz neg. May need to transfer to [facility] for further evaluation. Exam benign.  Runell Gess 10/30/2012, 8:21 AM

## 2012-10-30 NOTE — Progress Notes (Signed)
Troponin level- 1.24. Relayed to PA with no order.

## 2012-10-30 NOTE — ED Notes (Signed)
Pt currently c/o back pain;

## 2012-10-31 ENCOUNTER — Encounter (HOSPITAL_COMMUNITY)
Admission: EM | Disposition: A | Discharge status: Home / Self Care | Payer: Self-pay | Source: Home / Self Care | Attending: Cardiovascular Disease

## 2012-10-31 ENCOUNTER — Other Ambulatory Visit: Payer: Self-pay

## 2012-10-31 DIAGNOSIS — I214 Non-ST elevation (NSTEMI) myocardial infarction: Secondary | ICD-10-CM | POA: Diagnosis not present

## 2012-10-31 DIAGNOSIS — I252 Old myocardial infarction: Secondary | ICD-10-CM | POA: Diagnosis not present

## 2012-10-31 DIAGNOSIS — R079 Chest pain, unspecified: Secondary | ICD-10-CM | POA: Diagnosis not present

## 2012-10-31 DIAGNOSIS — I25810 Atherosclerosis of coronary artery bypass graft(s) without angina pectoris: Secondary | ICD-10-CM | POA: Diagnosis not present

## 2012-10-31 DIAGNOSIS — I2510 Atherosclerotic heart disease of native coronary artery without angina pectoris: Secondary | ICD-10-CM | POA: Diagnosis not present

## 2012-10-31 HISTORY — PX: PERCUTANEOUS CORONARY STENT INTERVENTION (PCI-S): SHX5485

## 2012-10-31 LAB — CBC
Hemoglobin: 9.6 g/dL — ABNORMAL LOW (ref 13.0–17.0)
MCH: 26.7 pg (ref 26.0–34.0)
MCHC: 31.4 g/dL (ref 30.0–36.0)
Platelets: 207 10*3/uL (ref 150–400)
RDW: 17.7 % — ABNORMAL HIGH (ref 11.5–15.5)

## 2012-10-31 LAB — PROTIME-INR
INR: 1.2 (ref 0.00–1.49)
Prothrombin Time: 15 seconds (ref 11.6–15.2)

## 2012-10-31 LAB — BASIC METABOLIC PANEL
BUN: 15 mg/dL (ref 6–23)
CO2: 21 mEq/L (ref 19–32)
Calcium: 8.5 mg/dL (ref 8.4–10.5)
Chloride: 109 mEq/L (ref 96–112)
Creatinine, Ser: 0.86 mg/dL (ref 0.50–1.35)
Glucose, Bld: 105 mg/dL — ABNORMAL HIGH (ref 70–99)

## 2012-10-31 LAB — GLUCOSE, CAPILLARY: Glucose-Capillary: 90 mg/dL (ref 70–99)

## 2012-10-31 SURGERY — PERCUTANEOUS CORONARY STENT INTERVENTION (PCI-S)
Anesthesia: LOCAL

## 2012-10-31 MED ORDER — DIAZEPAM 5 MG PO TABS
5.0000 mg | ORAL_TABLET | ORAL | Status: AC
Start: 2012-10-31 — End: 2012-10-31
  Administered 2012-10-31: 5 mg via ORAL
  Filled 2012-10-31: qty 1

## 2012-10-31 MED ORDER — POTASSIUM CHLORIDE CRYS ER 20 MEQ PO TBCR
40.0000 meq | EXTENDED_RELEASE_TABLET | Freq: Once | ORAL | Status: AC
  Administered 2012-10-31: 40 meq via ORAL
  Filled 2012-10-31: qty 2

## 2012-10-31 MED ORDER — ATROPINE SULFATE 1 MG/ML IJ SOLN
INTRAMUSCULAR | Status: AC
  Filled 2012-10-31: qty 1

## 2012-10-31 MED ORDER — MIDAZOLAM HCL 2 MG/2ML IJ SOLN
INTRAMUSCULAR | Status: AC
  Filled 2012-10-31: qty 2

## 2012-10-31 MED ORDER — MORPHINE SULFATE 2 MG/ML IJ SOLN: 2.0000 mg | INTRAMUSCULAR | Status: DC | PRN

## 2012-10-31 MED ORDER — CLOPIDOGREL BISULFATE 75 MG PO TABS
75.0000 mg | ORAL_TABLET | Freq: Every day | ORAL | Status: DC
  Administered 2012-11-01: 75 mg via ORAL
  Filled 2012-10-31: qty 1

## 2012-10-31 MED ORDER — NITROGLYCERIN 0.2 MG/ML ON CALL CATH LAB
INTRAVENOUS | Status: AC
  Filled 2012-10-31: qty 1

## 2012-10-31 MED ORDER — SODIUM CHLORIDE 0.9 % IV SOLN: INTRAVENOUS | Status: AC

## 2012-10-31 MED ORDER — SODIUM CHLORIDE 0.9 % IV SOLN
INTRAVENOUS | Status: DC
  Administered 2012-10-31: 11:00:00 via INTRAVENOUS

## 2012-10-31 MED ORDER — ACETAMINOPHEN 325 MG PO TABS: 650.0000 mg | ORAL_TABLET | ORAL | Status: DC | PRN

## 2012-10-31 MED ORDER — FENTANYL CITRATE 0.05 MG/ML IJ SOLN
INTRAMUSCULAR | Status: AC
  Filled 2012-10-31: qty 2

## 2012-10-31 MED ORDER — ONDANSETRON HCL 4 MG/2ML IJ SOLN
4.0000 mg | Freq: Four times a day (QID) | INTRAMUSCULAR | Status: DC | PRN
  Administered 2012-10-31: 4 mg via INTRAVENOUS
  Filled 2012-10-31: qty 2

## 2012-10-31 MED ORDER — BIVALIRUDIN 250 MG IV SOLR
INTRAVENOUS | Status: AC
  Filled 2012-10-31: qty 250

## 2012-10-31 MED ORDER — HEPARIN (PORCINE) IN NACL 2-0.9 UNIT/ML-% IJ SOLN
INTRAMUSCULAR | Status: AC
  Filled 2012-10-31: qty 2000

## 2012-10-31 MED ORDER — LIDOCAINE HCL (PF) 1 % IJ SOLN
INTRAMUSCULAR | Status: AC
  Filled 2012-10-31: qty 30

## 2012-10-31 MED ORDER — ASPIRIN EC 325 MG PO TBEC
325.0000 mg | DELAYED_RELEASE_TABLET | Freq: Every day | ORAL | Status: DC
  Administered 2012-11-01: 325 mg via ORAL
  Filled 2012-10-31: qty 1

## 2012-10-31 NOTE — Progress Notes (Signed)
ANTICOAGULATION CONSULT NOTE - Follow Up Consult  Pharmacy Consult for heparin Indication: chest pain/ACS  No Known Allergies  Patient Measurements: Height: 5\' 10"  (177.8 cm) Weight: 185 lb 3 oz (84 kg) IBW/kg (Calculated) : 73    Vital Signs: Temp: 98.4 F (36.9 C) (12/26 0817) Temp src: Oral (12/26 0817) BP: 105/61 mmHg (12/26 0821) Pulse Rate: 67  (12/26 0821)  Labs:  Basename 10/31/12 0600 10/30/12 1655 10/30/12 1245 10/30/12 0919 10/30/12 0024 10/30/12 0020  HGB 9.6* -- -- -- 11.2* --  HCT 30.6* -- -- -- 33.0* 33.2*  PLT 207 -- -- -- -- 269  APTT -- -- -- -- -- --  LABPROT -- -- -- -- -- --  INR -- -- -- -- -- --  HEPARINUNFRC 0.42 0.49 -- 0.73* -- --  CREATININE 0.86 -- -- -- 1.10 1.12  CKTOTAL -- -- -- -- -- --  CKMB -- -- -- -- -- --  TROPONINI -- 1.58* 1.28* 1.24* -- --    Estimated Creatinine Clearance: 88.4 ml/min (by C-G formula based on Cr of 0.86).   Medications:  Scheduled:    . amLODipine  5 mg Oral BID  . aspirin EC  81 mg Oral Daily  . atorvastatin  10 mg Oral q1800  . carvedilol  3.125 mg Oral BID WC  . clopidogrel  75 mg Oral Q breakfast  . ferrous sulfate  325 mg Oral TID WC  . finasteride  5 mg Oral Daily  . pantoprazole  40 mg Oral Daily  . [COMPLETED] potassium chloride  40 mEq Oral Once  . ranolazine  1,000 mg Oral BID  . terazosin  1 mg Oral QHS    Assessment: Pt is a 66 y/o M with complex cardiac history including recent CABG redo and two subsequent cardiac caths. Pt presents with new c/o CP radiating to jaw, positive troponin and ST depression. Pharmacy to manage heparin. HL therapeutic at 0.42.  No bleeding noted.   Goal of Therapy:  Heparin level 0.3-0.7 units/ml Monitor platelets by anticoagulation protocol: Yes   Plan:  1. Continue heparin gtt of 1200 units/hour 2. Monitor for s/sx of bleeding 3. CBC daily  Bola A. Wandra Feinstein D Clinical Pharmacist Pager:[phone] Phone [phone] 10/31/2012 9:28 AM

## 2012-10-31 NOTE — Progress Notes (Signed)
Pt refused to watch CCTV video. 

## 2012-10-31 NOTE — H&P (Signed)
.    Pt was reexamined and existing H & P reviewed. No changes found.  Runell Gess, MD [facility] 10/31/2012 5:43 PM

## 2012-10-31 NOTE — Progress Notes (Signed)
Pt transported to CATH lab.

## 2012-10-31 NOTE — Progress Notes (Signed)
Subjective:  No chest pain. Pt tearful "depressed"  Objective:  Vital Signs in the last 24 hours: Temp:  [97.9 F (36.6 C)-98.9 F (37.2 C)] 98.7 F (37.1 C) (12/26 0400) Pulse Rate:  [57-76] 67  (12/26 0700) Resp:  [15-33] 17  (12/26 0700) BP: (88-115)/(39-66) 108/54 mmHg (12/26 0700) SpO2:  [92 %-100 %] 98 % (12/26 0700) Weight:  [84 kg (185 lb 3 oz)] 84 kg (185 lb 3 oz) (12/26 0500)  Intake/Output from previous day:  Intake/Output Summary (Last 24 hours) at 10/31/12 0817 Last data filed at 10/31/12 0700  Gross per 24 hour  Intake 860.17 ml  Output   1250 ml  Net -389.83 ml    Physical Exam: General appearance: alert, cooperative and no distress Lungs: clear to auscultation bilaterally Heart: regular rate and rhythm   Rate: 68  Rhythm: normal sinus rhythm  Lab Results:  Basename 10/31/12 0600 10/30/12 0024 10/30/12 0020  WBC 7.8 -- 8.3  HGB 9.6* 11.2* --  PLT 207 -- 269    Basename 10/31/12 0600 10/30/12 0024 10/30/12 0020  NA 140 144 --  K 3.2* 3.5 --  CL 109 112 --  CO2 21 -- 20  GLUCOSE 105* 88 --  BUN 15 20 --  CREATININE 0.86 1.10 --    Basename 10/30/12 1655 10/30/12 1245  TROPONINI 1.58* 1.28*   Hepatic Function Panel  Basename 10/30/12 0020  PROT 7.0  ALBUMIN 3.3*  AST 13  ALT 14  ALKPHOS 72  BILITOT 0.2*  BILIDIR --  IBILI --   No results found for this basename: CHOL in the last 72 hours No results found for this basename: INR in the last 72 hours  Imaging: Imaging results have been reviewed  Cardiac Studies:  Assessment/Plan:   Principal Problem:  *NSTEMI, 3d episode this month Active Problems:  CAD, CABG X 09 Nov 2011. 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 Replace K+ this am.    Corine Shelter PA-C 10/31/2012, 8:17 AM  NSTEMI, on IV hep/ntg. For PCI SVG-OM today. Discussed case with Dr. Laneta Simmers.  Agree with note written by Corine Shelter [facility]  Nanetta Batty J 10/31/2012 12:40 PM

## 2012-10-31 NOTE — Progress Notes (Signed)
Pt on heparin drip. Pt often lie on hand occluding IV where heparin infusing. IV site alternated , pt not compliant with instructions. Educated  but non-compliant at intervals.

## 2012-10-31 NOTE — Op Note (Signed)
[name]. is a 66 y.o. male    [id number] LOCATION:  FACILITY: [facility]  PHYSICIAN: Nanetta Batty, M.D. 1946/09/06   DATE OF PROCEDURE:  10/31/2012  DATE OF DISCHARGE:  [facility]  CARDIAC CATHETERIZATION     History obtained from chart review.66 y.o. male with a past medical history significant for acute anterior STEMI in January of 2013. EKG at that time showed marked anterolateral ST depression and cardiac enzymes were positive. Code STEMI called and cath showed 99% ostial Left Main, moderate mid LAD stenosis, and moderate RCA stenosis. LV gram shows anteroapical hypokinesis. IABP inserted for anatomy and ongoing chest pain. He was taken emergently to the OR and underwent 3 vessel CABG (LIMA to LAD, SVG to OM and SVG to RCA). He had been doing well until 08/29/12 and he presented with NSTEMI. Cardiac cath revealed LM: occluded  LIMA to LAD: patent supplies LAD and retrograde to LCX with 80% prox LAD stenosis prior to LIMA insertion jeopardizing LCX; RCA: scattered native disease with patent SVG to RCA;SVG to OM: occluded  EF 50 - 55% Dr. Tresa Endo felt medical management would be best at this point. Dr. Laneta Simmers also reviewed the films and plan was if medical management failure then he would need Re-do CABG. [name] underwent redo CABG of his circumflex OM branch by Dr. Laneta Simmers roughly 6 weeks ago has had several omission since that time with unstable angina, non-ST segment elevation myocardial infarction. Multiple coronary catheterizations are shown a "kinked" SVG just proximal to the anastomosis of the circumflex OM graft. Because of ongoing pain the patient presents now for attempt at percutaneous revascularization. I discussed with Dr. Laneta Simmers prior to the procedure about a potential third redo bypass graft and he said this was not an option.   PROCEDURE DESCRIPTION:    The patient was brought to the second floor  [facility] cath lab in the  postabsorptive state. He was  premedicated with Valium 5 mg by mouth, IV Versed and fentanyl.Marland Kitchen His right groin was prepped and shaved in usual sterile fashion. Xylocaine 1% was used  for local anesthesia. A 7 French sheath was inserted into the right common femoral  artery using standard Seldinger technique. The patient was already on aspirin Plavix. He received Angiomax bolus with an ACT of 388. A total of 130 cc of contrast was administered to the patient. Using a 7 [place] guide catheter along with an 014, 190 cm left choice PT to guidewire and a 1.5/8 mm long balloon the tandem stenoses in the distal portion of the SVG to the OM were crossed with great difficulty and inflations were performed. Following this dilatation was performed with 20 by 12 mm balloon. A 2 5 x 23 mm long expedition drug-eluting stent was then positioned and deployed at 16 atmospheres (2.73 mm) resulting in reduction of tandem 95% lesions to 0% residual with excellent flow. The patient tolerated the procedure well without hemodynamic relative sequelae.   HEMODYNAMICS:    AO SYSTOLIC/AO DIASTOLIC: 115/63    IMPRESSION: Successful PCI and stenting of a recently placed SVG to OM in the setting of acute coronary syndrome/non-ST segment elevation myocardial infarction. He will be treated with aspirin and Plavix. The guidewire and guide catheter were removed and the sheath was secured to the patient left the Cath Lab in stable condition. Gently hydrated and discharged home in the morning.  [name], [facility] 10/31/2012 6:59 PM

## 2012-11-01 DIAGNOSIS — I25810 Atherosclerosis of coronary artery bypass graft(s) without angina pectoris: Secondary | ICD-10-CM | POA: Diagnosis not present

## 2012-11-01 DIAGNOSIS — I214 Non-ST elevation (NSTEMI) myocardial infarction: Secondary | ICD-10-CM | POA: Diagnosis not present

## 2012-11-01 DIAGNOSIS — I252 Old myocardial infarction: Secondary | ICD-10-CM | POA: Diagnosis not present

## 2012-11-01 DIAGNOSIS — I2510 Atherosclerotic heart disease of native coronary artery without angina pectoris: Secondary | ICD-10-CM | POA: Diagnosis not present

## 2012-11-01 DIAGNOSIS — Z951 Presence of aortocoronary bypass graft: Secondary | ICD-10-CM | POA: Diagnosis not present

## 2012-11-01 DIAGNOSIS — R079 Chest pain, unspecified: Secondary | ICD-10-CM | POA: Diagnosis not present

## 2012-11-01 LAB — PLATELET INHIBITION P2Y12: Platelet Function  P2Y12: 267 [PRU] (ref 194–418)

## 2012-11-01 LAB — CBC
HCT: 29.8 % — ABNORMAL LOW (ref 39.0–52.0)
Hemoglobin: 9.5 g/dL — ABNORMAL LOW (ref 13.0–17.0)
MCH: 26.9 pg (ref 26.0–34.0)
MCV: 84.4 fL (ref 78.0–100.0)
RBC: 3.53 MIL/uL — ABNORMAL LOW (ref 4.22–5.81)

## 2012-11-01 LAB — BASIC METABOLIC PANEL
CO2: 21 mEq/L (ref 19–32)
Calcium: 8.8 mg/dL (ref 8.4–10.5)
GFR calc non Af Amer: 89 mL/min — ABNORMAL LOW (ref >90)
Potassium: 3.5 mEq/L (ref 3.5–5.1)
Sodium: 138 mEq/L (ref 135–145)

## 2012-11-01 LAB — TROPONIN I: Troponin I: 0.8 ng/mL

## 2012-11-01 MED ORDER — ASPIRIN 325 MG PO TBEC: 325.0000 mg | DELAYED_RELEASE_TABLET | Freq: Every day | ORAL | Status: DC

## 2012-11-01 MED ORDER — AMLODIPINE BESYLATE 5 MG PO TABS
5.0000 mg | ORAL_TABLET | Freq: Every day | ORAL | Status: DC
  Administered 2012-11-01: 5 mg via ORAL
  Filled 2012-11-01: qty 1

## 2012-11-01 MED FILL — Dextrose Inj 5%: INTRAVENOUS | Qty: 50 | Status: AC

## 2012-11-01 NOTE — Discharge Summary (Signed)
Physician Discharge Summary  Patient ID: [name]. MRN: [id number] DOB/AGE: 11-Sep-1946 66 y.o.  Admit date: 10/30/2012 Discharge date: 11/01/2012  Admission Diagnoses: NSTEMI  Discharge Diagnoses:  Principal Problem:  *NSTEMI, 3d episode this month Active Problems:  CAD, CABG X 09 Nov 2011. re-do CABG X1 09/19/12, cath 12/11/3- medical Rx  Abnormal EKG, marked diffuse downsloping ST depression,ST elevation AVR and V1  HTN (hypertension)  ICM, EF 40% Jan 2013,  50-55% Oct 2013,  40-45% with apical infarct 10/18/12  Dyslipidemia  BPH (benign prostatic hyperplasia)  Anemia, heme negative 10/06/12  Hx of agent Orange exposure, he is followed at the [place]  Anxiety as acute reaction to exceptional stress   Discharged Condition: stable  Hospital Course: This is a 66 y.o. male with several recent admissions for ACS. He has a past medical history significant for extensive problems with coronary artery disease starting in January of this year. He presented with an acute anterolateral myocardial infarction. Cardiac catheterization showed a 99% ostial stenosis of the left main coronary artery with a mid LAD stenosis and moderate right coronary artery stenosis. He required treatment with an aortic balloon pump and then underwent emergency three-vessel bypass surgery (LIMA to LAD, SVG to OM, SVG to RCA by Dr. [name]). Postoperative left ventricular ejection fraction was estimated to be about 50-55%.   He returned on October 24 with a non-ST segment elevation myocardial infarction. Repeat coronary cath showed a total  occlusion of the left main coronary artery as well as total occlusion of the saphenous vein graft to the oblique marginal artery. There was a proximal M.D. artery stenosis that prevented adequate flow from the LIMA bypass to the LAD in a retrograde fashion to the left circumflex coronary artery. The bypass graft to the right coronary artery was patent. The patient continued to be  very symptomatic on medical therapy and he underwent single vessel redo bypass surgery to the oblique marginal artery on October 14. He was discharged from the hospital on October 17 .He then returned on the November 29, with a complaint of SSCP and was admitted until 12/2 with a small NSTEMI. Cath at that time  showed no obvious culprit lesion. He was admitted again 10/16/12- 10/20/12 with NSTEMI with positive Troponin and EKG changes. Repeat cath on 10/16/12 again showed no obvious culprit lesion and he was discharged 10/20/12, but came back later that same day with a NSTEMI, documented by EKG changes and Troponin elevation. He was Rx'd medically. Cines were reviewed. EKG changes suggested an anterior ischemia. Thallium redistribution study was done and showed scar, no viable myocardium.  He was presumed to have coronary spasms. He was discharged home on 12/20 with changes to his medications. His Hytrin was increased and Norvasc was added. His Imdur was increased, his beta blocker changed, and he was ordered to continue Ranexa.  He returned once again on 12/25 with chest pain and positive enzymes. Multiple past coronary caths had demonstrated a "kinked" SVG just proximal to the anastomosis of the circumflex OM graft. It was decided to attempt stenting the segment. On 12/26, he underwent successful PCI and stenting of the SVG to OM. He left the cath lab in stable condition. On POD #1, he was pain free and troponins were down-trending.  He was last seen and evaluated by Dr. Royann Shivers, who felt that he was stable for discharge home with ASA and Plavix.  OP follow has been arranged with Wilburt Finlay on 11/15/12.  Consults: None  Significant Diagnostic Studies:  Multiple prior caths on recent admission have demonstrated a kink in the SVG to the OM  Treatments: See Hospital Course  Discharge Exam: Blood pressure 104/65, pulse 63, temperature 98.5 F (36.9 C), temperature source Oral, resp. rate 18, height 5\' 10"   (1.778 m), weight 84 kg (185 lb 3 oz), SpO2 97.00%.  Disposition: 01-Home or Self Care  Discharge Orders    Future Appointments: Provider: Department: Dept Phone: Center:   11/12/2012 12:30 PM Alleen Borne, MD [facility] [phone] [facility]     Future Orders Please Complete By Expires   Diet - low sodium heart healthy      Increase activity slowly      Discharge instructions      Comments:   No driving for 3 days and no lifting more than a 1/2 gallon of milk       Medication List     As of 11/01/2012  2:11 PM    STOP taking these medications         ibuprofen 600 MG tablet   Commonly known as: ADVIL,MOTRIN      isosorbide mononitrate 60 MG 24 hr tablet   Commonly known as: IMDUR      TAKE these medications         acetaminophen 325 MG tablet   Commonly known as: TYLENOL   Take 2 tablets (650 mg total) by mouth every 4 (four) hours as needed.      amLODipine 5 MG tablet   Commonly known as: NORVASC   Take 1 tablet (5 mg total) by mouth daily.      aspirin 325 MG EC tablet   Take 1 tablet (325 mg total) by mouth daily.      atorvastatin 10 MG tablet   Commonly known as: LIPITOR   Take 10 mg by mouth daily.      carvedilol 3.125 MG tablet   Commonly known as: COREG   Take 1 tablet (3.125 mg total) by mouth 2 (two) times daily with a meal.      clopidogrel 75 MG tablet   Commonly known as: PLAVIX   Take 1 tablet (75 mg total) by mouth daily with breakfast.      ferrous sulfate 325 (65 FE) MG tablet   Take 1 tablet (325 mg total) by mouth 3 (three) times daily with meals.      finasteride 5 MG tablet   Commonly known as: PROSCAR   Take 5 mg by mouth daily.      HYDROcodone-acetaminophen 10-325 MG per tablet   Commonly known as: NORCO   Take 1 tablet by mouth every 6 (six) hours as needed for pain.      nitroGLYCERIN 0.4 MG SL tablet   Commonly known as: NITROSTAT   Place 1 tablet (0.4 mg total) under the tongue  every 5 (five) minutes x 3 doses as needed for chest pain.      omeprazole 20 MG capsule   Commonly known as: PRILOSEC   Take 20 mg by mouth 2 (two) times daily.      ranolazine 1000 MG SR tablet   Commonly known as: RANEXA   Take 1 tablet (1,000 mg total) by mouth 2 (two) times daily.      terazosin 1 MG capsule   Commonly known as: HYTRIN   Take 1 capsule (1 mg total) by mouth at bedtime.           Follow-up  Information    Follow up with Victorya Hillman, PA. On 11/15/2012. (2:00 pm)    Contact information:   [address] [place] [address] [phone]          Signed: Wilburt Finlay 11/01/2012, 2:11 PM

## 2012-11-01 NOTE — Progress Notes (Signed)
Utilization Review Completed.  11/01/2012  

## 2012-11-01 NOTE — Progress Notes (Signed)
CARDIAC REHAB PHASE I   PRE:  Rate/Rhythm: 67 SR    BP: sitting 109/47    SaO2: 96 RA  MODE:  Ambulation: 1050 ft   POST:  Rate/Rhythm: 79 SR    BP: sitting 104/54     SaO2:   Quick, independent, long walk. No c/o. Denied CP/SOB/dizziness. Sts he feels good except "still weak" from 6 weeks of inactivity. Pt is hesitant to be too hopeful that this was the problem. Ed completed/reviewed. Hopeful to get exercising and into CRPII now.  [phone]  Harriet Masson CES, ACSM

## 2012-11-01 NOTE — Progress Notes (Signed)
Discharged home accompanied by wife, discharged instructions and belongings with pt., stable.

## 2012-11-01 NOTE — Progress Notes (Signed)
Sheath pulled from right groin. Small amount of bruising at suture site present previous to removal. Pressure held for 20 minutes by [facility] RN with assistance from this RN. Vitals WDL during and post procedure.  Pressure dressing applied and bedrest instructions given with teach back. R groin Level 1 due to small amount of bruising

## 2012-11-01 NOTE — Progress Notes (Signed)
[facility]  Subjective: No complaints at this time.  Objective: Vital signs in last 24 hours: Temp:  [97.7 F (36.5 C)-98.8 F (37.1 C)] 98.8 F (37.1 C) (12/27 0725) Pulse Rate:  [61-71] 61  (12/27 0725) Resp:  [14-30] 22  (12/27 0725) BP: (81-126)/(39-76) 95/52 mmHg (12/27 0725) SpO2:  [93 %-100 %] 99 % (12/27 0725) Last BM Date: 10/29/12  Intake/Output from previous day: 12/26 0701 - 12/27 0700 In: 1133 [I.V.:1133] Out: 1400 [Urine:1400] Intake/Output this shift: Total I/O In: -  Out: 275 [Urine:275]  Medications Current Facility-Administered Medications  Medication Dose Route Frequency Provider Last Rate Last Dose  . acetaminophen (TYLENOL) tablet 650 mg  650 mg Oral Q4H PRN Runell Gess, MD      . amLODipine (NORVASC) tablet 5 mg  5 mg Oral BID Marykay Lex, MD   5 mg at 10/31/12 0934  . aspirin EC tablet 325 mg  325 mg Oral Daily Runell Gess, MD      . atorvastatin (LIPITOR) tablet 10 mg  10 mg Oral q1800 Marykay Lex, MD   10 mg at 10/31/12 1702  . carvedilol (COREG) tablet 3.125 mg  3.125 mg Oral BID WC Marykay Lex, MD   3.125 mg at 10/31/12 1713  . clopidogrel (PLAVIX) tablet 75 mg  75 mg Oral Q breakfast Runell Gess, MD      . diazepam (VALIUM) tablet 2 mg  2 mg Oral Q8H PRN Marykay Lex, MD      . ferrous sulfate tablet 325 mg  325 mg Oral TID WC Marykay Lex, MD   325 mg at 10/31/12 1700  . finasteride (PROSCAR) tablet 5 mg  5 mg Oral Daily Marykay Lex, MD   5 mg at 10/31/12 0935  . morphine 2 MG/ML injection 2 mg  2 mg Intravenous Q1H PRN Runell Gess, MD      . ondansetron [facility]) injection 4 mg  4 mg Intravenous Q6H PRN Runell Gess, MD   4 mg at 10/31/12 2306  . pantoprazole (PROTONIX) EC tablet 40 mg  40 mg Oral Daily Marykay Lex, MD   40 mg at 10/31/12 [phone]  . ranolazine (RANEXA) 12 hr tablet 1,000 mg  1,000 mg Oral BID Marykay Lex, MD   1,000 mg at 10/31/12 0936  .  terazosin (HYTRIN) capsule 1 mg  1 mg Oral QHS Marykay Lex, MD   1 mg at 10/30/12 2119    PE: General appearance: alert, cooperative and no distress Lungs: clear to auscultation bilaterally Heart: regular rate and rhythm, S1, S2 normal, no murmur, click, rub or gallop Extremities: No LEE Pulses: 2+ and symmetric Skin: Right groin without hematoma, ecchymosis and nontender. Neurologic: Grossly normal  Lab Results:   Basename 11/01/12 0500 10/31/12 0600 10/30/12 0024 10/30/12 0020  WBC 7.4 7.8 -- 8.3  HGB 9.5* 9.6* 11.2* --  HCT 29.8* 30.6* 33.0* --  PLT 200 207 -- 269   BMET  Basename 11/01/12 0500 10/31/12 0600 10/30/12 0024 10/30/12 0020  NA 138 140 144 --  K 3.5 3.2* 3.5 --  CL 106 109 112 --  CO2 21 21 -- 20  GLUCOSE 91 105* 88 --  BUN 13 15 20  --  CREATININE 0.86 0.86 1.10 --  CALCIUM 8.8 8.5 -- 9.2   PT/INR  Basename 10/31/12 1128  LABPROT 15.0  INR 1.20   Cholesterol No results found for this basename:  CHOL in the last 72 hours Cardiac Enzymes Cardiac Panel (last 3 results)  Basename 10/30/12 1655 10/30/12 1245 10/30/12 0919  CKTOTAL -- -- --  CKMB -- -- --  TROPONINI 1.58* 1.28* 1.24*  RELINDX -- -- --    Studies/Results: @RISRSLT2 @   Assessment/Plan   Principal Problem:  *NSTEMI, 3d episode this month Active Problems:  CAD, CABG X 09 Nov 2011. re-do CABG X1 09/19/12, cath 12/11/3- medical Rx  Abnormal EKG, marked diffuse downsloping ST depression,ST elevation AVR and V1  HTN (hypertension)  ICM, EF 40% Jan 2013,  50-55% Oct 2013,  40-45% with apical infarct 10/18/12  Dyslipidemia  BPH (benign prostatic hyperplasia)  Anemia, heme negative 10/06/12  Hx of agent Orange exposure, he is followed at the [place]  Anxiety as acute reaction to exceptional stress  Plan:  S/P left heart cath resulting in successful PCI and stenting of the SVG to OM.  The patient states he noticed a difference right after the procedure.  ASA, plavix.  Troponin peak 1.58.   Rechecking this AM.  BP on the soft side.   On very little hypertensive meds- coreg 3.125, amlodipine 5mg  bid.  Ambulate this AM.  Possible DC home later today.      LOS: 2 days    [name], [name] 11/01/2012 8:10 AM  I have seen and examined the patient along with [name], BRYAN, PA.  I have reviewed the chart, notes and new data.  I agree with PA's note.  He had a severe LCX graft stenosis and revascularizing it should make a big difference in symptoms and outcome.  PLAN: DC home later today if no angina when walking. Reduce amlodipine - it now appears that spasm was probably not an important part of his problem.  Darrell Fair, MD, [facility] [phone] 11/01/2012, 8:36 AM

## 2012-11-12 ENCOUNTER — Ambulatory Visit: Payer: Self-pay | Admitting: Surgery

## 2012-11-13 ENCOUNTER — Ambulatory Visit: Payer: Medicare Other | Admitting: Surgery

## 2012-11-15 DIAGNOSIS — I2510 Atherosclerotic heart disease of native coronary artery without angina pectoris: Secondary | ICD-10-CM | POA: Diagnosis not present

## 2012-11-15 DIAGNOSIS — I10 Essential (primary) hypertension: Secondary | ICD-10-CM | POA: Diagnosis not present

## 2012-11-15 DIAGNOSIS — E782 Mixed hyperlipidemia: Secondary | ICD-10-CM | POA: Diagnosis not present

## 2012-11-25 ENCOUNTER — Ambulatory Visit (INDEPENDENT_AMBULATORY_CARE_PROVIDER_SITE_OTHER): Payer: Self-pay | Admitting: Surgical

## 2012-11-25 VITALS — BP 147/80 | HR 83 | Resp 20 | Ht 70.0 in | Wt 185.0 lb

## 2012-11-25 DIAGNOSIS — Z951 Presence of aortocoronary bypass graft: Secondary | ICD-10-CM

## 2012-11-25 DIAGNOSIS — I2510 Atherosclerotic heart disease of native coronary artery without angina pectoris: Secondary | ICD-10-CM

## 2012-11-25 NOTE — Progress Notes (Signed)
[address]            [place] [address]          [phone]       [name]. [place] Medical Record #[id number] Date of Birth: 1946-07-19  Thurmon Fair, MD Provider Not In System  Chief Complaint:   PostOp Follow Up Visit   History of Present Illness: The patient is seen on today's date in followup. Most recently he has undergone stent placement of the most recent saphenous vein graft to the obtuse marginal. This was done on 10/31/2012 by Dr. Allyson Sabal. Currently he feels very well and has had no further symptoms. He is having no surgically related issues at this time.              History  Smoking status  . Former Smoker -- 0.5 packs/day for 50 years  . Types: Cigarettes  . Quit date: 12/02/2011  Smokeless tobacco  . Never Used       No Known Allergies  Current Outpatient Prescriptions  Medication Sig Dispense Refill  . acetaminophen (TYLENOL) 325 MG tablet Take 2 tablets (650 mg total) by mouth every 4 (four) hours as needed.      Marland Kitchen amLODipine (NORVASC) 5 MG tablet Take 1 tablet (5 mg total) by mouth daily.  30 tablet  5  . aspirin EC 325 MG EC tablet Take 1 tablet (325 mg total) by mouth daily.  30 tablet    . atorvastatin (LIPITOR) 10 MG tablet Take 10 mg by mouth daily.      . carvedilol (COREG) 3.125 MG tablet Take 1 tablet (3.125 mg total) by mouth 2 (two) times daily with a meal.  60 tablet  5  . clopidogrel (PLAVIX) 75 MG tablet Take 1 tablet (75 mg total) by mouth daily with breakfast.  30 tablet  11  . ferrous sulfate 325 (65 FE) MG tablet Take 1 tablet (325 mg total) by mouth 3 (three) times daily with meals.  30 tablet  5  . finasteride (PROSCAR) 5 MG tablet Take 5 mg by mouth daily.      Marland Kitchen HYDROcodone-acetaminophen (NORCO) 10-325 MG per tablet Take 1 tablet by mouth every 6 (six) hours as needed for pain.  30 tablet  0  . nitroGLYCERIN (NITROSTAT) 0.4 MG SL tablet Place 1 tablet (0.4 mg total) under  the tongue every 5 (five) minutes x 3 doses as needed for chest pain.  25 tablet  5  . omeprazole (PRILOSEC) 20 MG capsule Take 20 mg by mouth 2 (two) times daily.      Marland Kitchen terazosin (HYTRIN) 1 MG capsule Take 1 capsule (1 mg total) by mouth at bedtime.  30 capsule  5       Physical Exam: BP 147/80  Pulse 83  Resp 20  Ht 5\' 10"  (1.778 m)  Wt 185 lb (83.915 kg)  BMI 26.54 kg/m2  SpO2 97%  General appearance: alert, cooperative and no distress Heart: regular rate and rhythm Lungs: clear to auscultation bilaterally Extremities: Mild lower extremity edema right greater than left Wound: Incisions are well-healed  Diagnostic Studies & Laboratory data:         Recent Radiology Findings: No results found.    Recent Labs: Lab Results  Component Value Date   WBC 7.4 11/01/2012   HGB 9.5* 11/01/2012   HCT 29.8* 11/01/2012  PLT 200 11/01/2012   GLUCOSE 91 11/01/2012   CHOL 102 10/21/2012   TRIG 71 10/21/2012   HDL 36* 10/21/2012   LDLCALC 52 10/21/2012   ALT 14 10/30/2012   AST 13 10/30/2012   NA 138 11/01/2012   K 3.5 11/01/2012   CL 106 11/01/2012   CREATININE 0.86 11/01/2012   BUN 13 11/01/2012   CO2 21 11/01/2012   TSH 2.229 09/10/2012   INR 1.20 10/31/2012   HGBA1C 6.0* 09/03/2012      Assessment / Plan:  We will see again on a when necessary basis for any surgical needs. He is quite stable at this time.          Edwena Mayorga E 11/25/2012 2:07 PM

## 2012-11-25 NOTE — Patient Instructions (Signed)
Follow up as needed

## 2012-11-26 ENCOUNTER — Ambulatory Visit: Payer: Self-pay | Admitting: Surgery

## 2012-12-13 DIAGNOSIS — I10 Essential (primary) hypertension: Secondary | ICD-10-CM | POA: Diagnosis not present

## 2012-12-13 DIAGNOSIS — E782 Mixed hyperlipidemia: Secondary | ICD-10-CM | POA: Diagnosis not present

## 2012-12-13 DIAGNOSIS — I2510 Atherosclerotic heart disease of native coronary artery without angina pectoris: Secondary | ICD-10-CM | POA: Diagnosis not present

## 2012-12-19 ENCOUNTER — Ambulatory Visit (HOSPITAL_COMMUNITY): Payer: Medicare Other

## 2012-12-23 ENCOUNTER — Encounter (HOSPITAL_COMMUNITY): Payer: Medicare Other

## 2012-12-25 ENCOUNTER — Encounter (HOSPITAL_COMMUNITY): Payer: Medicare Other

## 2012-12-26 ENCOUNTER — Encounter (HOSPITAL_COMMUNITY)
Admission: RE | Admit: 2012-12-26 | Discharge: 2012-12-26 | Disposition: A | Discharge status: Home / Self Care | Payer: Medicare Other | Source: Ambulatory Visit | Attending: Cardiovascular Disease | Admitting: Cardiovascular Disease

## 2012-12-26 DIAGNOSIS — I214 Non-ST elevation (NSTEMI) myocardial infarction: Secondary | ICD-10-CM | POA: Insufficient documentation

## 2012-12-26 DIAGNOSIS — I2510 Atherosclerotic heart disease of native coronary artery without angina pectoris: Secondary | ICD-10-CM | POA: Insufficient documentation

## 2012-12-26 DIAGNOSIS — I10 Essential (primary) hypertension: Secondary | ICD-10-CM | POA: Insufficient documentation

## 2012-12-26 DIAGNOSIS — Z5189 Encounter for other specified aftercare: Secondary | ICD-10-CM | POA: Insufficient documentation

## 2012-12-26 NOTE — Progress Notes (Signed)
Cardiac Rehab Medication Review by a Pharmacist  Does the patient  feel that his/her medications are working for him/her?  yes  Has the patient been experiencing any side effects to the medications prescribed?  no  Does the patient measure his/her own blood pressure or blood glucose at home?  Sometimes takes own blood pressure at own. Patient tells me he was was diagnosed with DM at the [facility]. He is currently not on any medication and does not check his blood sugars at home.   Does the patient have any problems obtaining medications due to transportation or finances?   no  Understanding of regimen: good Understanding of indications: good Potential of compliance: good   Franchot Erichsen 12/26/2012 8:16 AM

## 2012-12-27 ENCOUNTER — Encounter (HOSPITAL_COMMUNITY): Payer: Medicare Other

## 2012-12-30 ENCOUNTER — Encounter (HOSPITAL_COMMUNITY)
Admission: RE | Admit: 2012-12-30 | Discharge: 2012-12-30 | Disposition: A | Discharge status: Home / Self Care | Payer: Medicare Other | Source: Ambulatory Visit | Attending: Cardiovascular Disease | Admitting: Cardiovascular Disease

## 2012-12-30 DIAGNOSIS — I2510 Atherosclerotic heart disease of native coronary artery without angina pectoris: Secondary | ICD-10-CM | POA: Diagnosis not present

## 2012-12-30 DIAGNOSIS — I10 Essential (primary) hypertension: Secondary | ICD-10-CM | POA: Diagnosis not present

## 2012-12-30 DIAGNOSIS — I214 Non-ST elevation (NSTEMI) myocardial infarction: Secondary | ICD-10-CM | POA: Diagnosis not present

## 2012-12-30 DIAGNOSIS — Z5189 Encounter for other specified aftercare: Secondary | ICD-10-CM | POA: Diagnosis not present

## 2012-12-30 LAB — GLUCOSE, CAPILLARY: Glucose-Capillary: 94 mg/dL (ref 70–99)

## 2012-12-30 NOTE — Progress Notes (Signed)
Pt started cardiac rehab today.  Pt tolerated light exercise without difficulty.  VSS, telemetry- NSR, occ. PVC.  Pt PHQ-9 score =9.  Pt reports long term PTSD which he states is currently under control.  He receives treatment at [facility] when symptoms active.   Pt oriented to exercise equipment and routine.  Understanding verbalized.

## 2013-01-01 ENCOUNTER — Encounter (HOSPITAL_COMMUNITY)
Admission: RE | Admit: 2013-01-01 | Discharge: 2013-01-01 | Disposition: A | Discharge status: Home / Self Care | Payer: Medicare Other | Source: Ambulatory Visit | Attending: Cardiovascular Disease | Admitting: Cardiovascular Disease

## 2013-01-03 ENCOUNTER — Encounter (HOSPITAL_COMMUNITY)
Admission: RE | Admit: 2013-01-03 | Discharge: 2013-01-03 | Disposition: A | Discharge status: Home / Self Care | Payer: Medicare Other | Source: Ambulatory Visit | Attending: Cardiovascular Disease | Admitting: Cardiovascular Disease

## 2013-01-06 ENCOUNTER — Encounter (HOSPITAL_COMMUNITY)
Admission: RE | Admit: 2013-01-06 | Discharge: 2013-01-06 | Disposition: A | Discharge status: Home / Self Care | Payer: Medicare Other | Source: Ambulatory Visit | Attending: Cardiovascular Disease | Admitting: Cardiovascular Disease

## 2013-01-06 DIAGNOSIS — I214 Non-ST elevation (NSTEMI) myocardial infarction: Secondary | ICD-10-CM | POA: Diagnosis not present

## 2013-01-06 DIAGNOSIS — I2510 Atherosclerotic heart disease of native coronary artery without angina pectoris: Secondary | ICD-10-CM | POA: Insufficient documentation

## 2013-01-06 DIAGNOSIS — Z5189 Encounter for other specified aftercare: Secondary | ICD-10-CM | POA: Insufficient documentation

## 2013-01-06 DIAGNOSIS — I10 Essential (primary) hypertension: Secondary | ICD-10-CM | POA: Insufficient documentation

## 2013-01-06 NOTE — Progress Notes (Signed)
[name] 67 y.o. male Nutrition Note Spoke with pt.  Nutrition Plan and Nutrition Survey goals reviewed with pt. Pt is following Step 2 of the Therapeutic Lifestyle Changes diet. Pt wants to lose wt. Per discussion with pt, pt has not made a concerted effort to lose wt other than "a few dietary changes." Pt states his UBW 185#. Pt's wt is up 11.5# from reported UBW. Pt reports his lowest body wt after his 2 surgeries was 175#. Pt feels his wt gain is due to decreased activity with medical condition. Pt recently quit using tobacco "cold [place]" 12/02/11. Wt loss tips reviewed.  Pt is a diet-controlled diabetic. Last A1c indicates blood glucose well-controlled. This Clinical research associate went over Diabetes Education test results. Pt expressed understanding of the information reviewed. Pt aware of nutrition education classes offered and pt is unsure if he will be able to attend nutrition classes   Nutrition Diagnosis   Food-and nutrition-related knowledge deficit related to lack of exposure to information as related to diagnosis of: ? CVD ? DM (A1c 6.0)    Obesity related to excessive energy intake as evidenced by a BMI of 30.4  Nutrition RX/ Estimated Daily Nutrition Needs for: wt loss  1500-2000 Kcal, 40-55 gm fat, 9-13 gm sat fat, 1.4-2.0 gm trans-fat, <1500 mg sodium, 175-250 gm CHO   Nutrition Intervention   Pt's individual nutrition plan reviewed with pt.   Benefits of adopting Therapeutic Lifestyle Changes discussed when Medficts reviewed.   Pt to attend the Portion Distortion class   Pt given handouts for: ? Nutrition I class ? Nutrition II class ? Diabetes Blitz class   Continue client-centered nutrition education by RD, as part of interdisciplinary care.  Goal(s)   Pt to identify food quantities necessary to achieve: ? wt loss to a goal wt of 172-184 lb (78.4-83.8 kg) at graduation from cardiac rehab.    Pt to describe the benefit of including fruits, vegetables, whole grains, and low-fat dairy  products in a heart healthy meal plan. Monitor and Evaluate progress toward nutrition goal with team. Nutrition Risk: Change to Moderate

## 2013-01-08 ENCOUNTER — Encounter (HOSPITAL_COMMUNITY): Payer: Medicare Other

## 2013-01-10 ENCOUNTER — Encounter (HOSPITAL_COMMUNITY): Payer: Medicare Other

## 2013-01-13 ENCOUNTER — Encounter (HOSPITAL_COMMUNITY)
Admission: RE | Admit: 2013-01-13 | Discharge: 2013-01-13 | Disposition: A | Discharge status: Home / Self Care | Payer: Medicare Other | Source: Ambulatory Visit | Attending: Cardiovascular Disease | Admitting: Cardiovascular Disease

## 2013-01-13 DIAGNOSIS — Z5189 Encounter for other specified aftercare: Secondary | ICD-10-CM | POA: Diagnosis not present

## 2013-01-15 ENCOUNTER — Encounter (HOSPITAL_COMMUNITY)
Admission: RE | Admit: 2013-01-15 | Discharge: 2013-01-15 | Disposition: A | Discharge status: Home / Self Care | Payer: Medicare Other | Source: Ambulatory Visit | Attending: Cardiovascular Disease | Admitting: Cardiovascular Disease

## 2013-01-15 DIAGNOSIS — Z5189 Encounter for other specified aftercare: Secondary | ICD-10-CM | POA: Diagnosis not present

## 2013-01-17 ENCOUNTER — Encounter (HOSPITAL_COMMUNITY)
Admission: RE | Admit: 2013-01-17 | Discharge: 2013-01-17 | Disposition: A | Discharge status: Home / Self Care | Payer: Medicare Other | Source: Ambulatory Visit | Attending: Cardiovascular Disease | Admitting: Cardiovascular Disease

## 2013-01-17 DIAGNOSIS — Z5189 Encounter for other specified aftercare: Secondary | ICD-10-CM | POA: Diagnosis not present

## 2013-01-17 NOTE — Progress Notes (Signed)
Reviewed patient quality of life questionnaire. Pt scores low in health/functionality sections.  Pt states this is related to his health history this past year. Pt is discouraged and concerned for his prognosis with multiple cardiac events this year.  However pt states, "I feel better today than I have in 3 years."  Pt states he is enjoying cardiac rehab and plans to continue with the maintenance program after graduation.  Pt exhibits positive coping skills.  Emotional reassurance offered.  Will continue to monitor

## 2013-01-20 ENCOUNTER — Encounter (HOSPITAL_COMMUNITY)
Admission: RE | Admit: 2013-01-20 | Discharge: 2013-01-20 | Disposition: A | Discharge status: Home / Self Care | Payer: Medicare Other | Source: Ambulatory Visit | Attending: Cardiovascular Disease | Admitting: Cardiovascular Disease

## 2013-01-20 DIAGNOSIS — Z5189 Encounter for other specified aftercare: Secondary | ICD-10-CM | POA: Diagnosis not present

## 2013-01-22 ENCOUNTER — Encounter (HOSPITAL_COMMUNITY)
Admission: RE | Admit: 2013-01-22 | Discharge: 2013-01-22 | Disposition: A | Discharge status: Home / Self Care | Payer: Medicare Other | Source: Ambulatory Visit | Attending: Cardiovascular Disease | Admitting: Cardiovascular Disease

## 2013-01-22 DIAGNOSIS — Z5189 Encounter for other specified aftercare: Secondary | ICD-10-CM | POA: Diagnosis not present

## 2013-01-24 ENCOUNTER — Encounter (HOSPITAL_COMMUNITY)
Admission: RE | Admit: 2013-01-24 | Discharge: 2013-01-24 | Disposition: A | Discharge status: Home / Self Care | Payer: Medicare Other | Source: Ambulatory Visit | Attending: Cardiovascular Disease | Admitting: Cardiovascular Disease

## 2013-01-24 DIAGNOSIS — Z5189 Encounter for other specified aftercare: Secondary | ICD-10-CM | POA: Diagnosis not present

## 2013-01-27 ENCOUNTER — Encounter (HOSPITAL_COMMUNITY)
Admission: RE | Admit: 2013-01-27 | Discharge: 2013-01-27 | Disposition: A | Discharge status: Home / Self Care | Payer: Medicare Other | Source: Ambulatory Visit | Attending: Cardiovascular Disease | Admitting: Cardiovascular Disease

## 2013-01-27 DIAGNOSIS — Z5189 Encounter for other specified aftercare: Secondary | ICD-10-CM | POA: Diagnosis not present

## 2013-01-29 ENCOUNTER — Encounter (HOSPITAL_COMMUNITY)
Admission: RE | Admit: 2013-01-29 | Discharge: 2013-01-29 | Disposition: A | Discharge status: Home / Self Care | Payer: Medicare Other | Source: Ambulatory Visit | Attending: Cardiovascular Disease | Admitting: Cardiovascular Disease

## 2013-01-29 DIAGNOSIS — Z5189 Encounter for other specified aftercare: Secondary | ICD-10-CM | POA: Diagnosis not present

## 2013-01-31 ENCOUNTER — Encounter (HOSPITAL_COMMUNITY)
Admission: RE | Admit: 2013-01-31 | Discharge: 2013-01-31 | Disposition: A | Discharge status: Home / Self Care | Payer: Medicare Other | Source: Ambulatory Visit | Attending: Cardiovascular Disease | Admitting: Cardiovascular Disease

## 2013-01-31 DIAGNOSIS — Z5189 Encounter for other specified aftercare: Secondary | ICD-10-CM | POA: Diagnosis not present

## 2013-02-03 ENCOUNTER — Encounter (HOSPITAL_COMMUNITY)
Admission: RE | Admit: 2013-02-03 | Discharge: 2013-02-03 | Disposition: A | Discharge status: Home / Self Care | Payer: Medicare Other | Source: Ambulatory Visit | Attending: Cardiovascular Disease | Admitting: Cardiovascular Disease

## 2013-02-03 DIAGNOSIS — Z5189 Encounter for other specified aftercare: Secondary | ICD-10-CM | POA: Diagnosis not present

## 2013-02-05 ENCOUNTER — Encounter (HOSPITAL_COMMUNITY)
Admission: RE | Admit: 2013-02-05 | Discharge: 2013-02-05 | Disposition: A | Discharge status: Home / Self Care | Payer: Medicare Other | Source: Ambulatory Visit | Attending: Cardiovascular Disease | Admitting: Cardiovascular Disease

## 2013-02-05 DIAGNOSIS — I2510 Atherosclerotic heart disease of native coronary artery without angina pectoris: Secondary | ICD-10-CM | POA: Insufficient documentation

## 2013-02-05 DIAGNOSIS — Z5189 Encounter for other specified aftercare: Secondary | ICD-10-CM | POA: Insufficient documentation

## 2013-02-05 DIAGNOSIS — I214 Non-ST elevation (NSTEMI) myocardial infarction: Secondary | ICD-10-CM | POA: Insufficient documentation

## 2013-02-05 DIAGNOSIS — I10 Essential (primary) hypertension: Secondary | ICD-10-CM | POA: Insufficient documentation

## 2013-02-07 ENCOUNTER — Encounter (HOSPITAL_COMMUNITY): Payer: Medicare Other

## 2013-02-10 ENCOUNTER — Encounter (HOSPITAL_COMMUNITY)
Admission: RE | Admit: 2013-02-10 | Discharge: 2013-02-10 | Disposition: A | Discharge status: Home / Self Care | Payer: Medicare Other | Source: Ambulatory Visit | Attending: Cardiovascular Disease | Admitting: Cardiovascular Disease

## 2013-02-10 NOTE — Progress Notes (Signed)
Pt hypertensive with exercise.BP- 174/76. Workload decreased.  Recheck 118/70 post exercise.  Pt denies missed meds or change in diet.  Workloads increased last week on all stations.   Will continue to monitor.    Pt advised to decrease intensity of workout next session and cautioned against missed medication doses.  Understanding verbalized.

## 2013-02-12 ENCOUNTER — Encounter (HOSPITAL_COMMUNITY)
Admission: RE | Admit: 2013-02-12 | Discharge: 2013-02-12 | Disposition: A | Discharge status: Home / Self Care | Payer: Medicare Other | Source: Ambulatory Visit | Attending: Cardiovascular Disease | Admitting: Cardiovascular Disease

## 2013-02-14 ENCOUNTER — Encounter (HOSPITAL_COMMUNITY)
Admission: RE | Admit: 2013-02-14 | Discharge: 2013-02-14 | Disposition: A | Discharge status: Home / Self Care | Payer: Medicare Other | Source: Ambulatory Visit | Attending: Cardiovascular Disease | Admitting: Cardiovascular Disease

## 2013-02-17 ENCOUNTER — Encounter (HOSPITAL_COMMUNITY)
Admission: RE | Admit: 2013-02-17 | Discharge: 2013-02-17 | Disposition: A | Discharge status: Home / Self Care | Payer: Medicare Other | Source: Ambulatory Visit | Attending: Cardiovascular Disease | Admitting: Cardiovascular Disease

## 2013-02-19 ENCOUNTER — Encounter (HOSPITAL_COMMUNITY)
Admission: RE | Admit: 2013-02-19 | Discharge: 2013-02-19 | Disposition: A | Discharge status: Home / Self Care | Payer: Medicare Other | Source: Ambulatory Visit | Attending: Cardiovascular Disease | Admitting: Cardiovascular Disease

## 2013-02-21 ENCOUNTER — Encounter (HOSPITAL_COMMUNITY)
Admission: RE | Admit: 2013-02-21 | Discharge: 2013-02-21 | Disposition: A | Discharge status: Home / Self Care | Payer: Medicare Other | Source: Ambulatory Visit | Attending: Cardiovascular Disease | Admitting: Cardiovascular Disease

## 2013-02-24 ENCOUNTER — Encounter (HOSPITAL_COMMUNITY)
Admission: RE | Admit: 2013-02-24 | Discharge: 2013-02-24 | Disposition: A | Discharge status: Home / Self Care | Payer: Medicare Other | Source: Ambulatory Visit | Attending: Cardiovascular Disease | Admitting: Cardiovascular Disease

## 2013-02-26 ENCOUNTER — Encounter (HOSPITAL_COMMUNITY): Payer: Medicare Other

## 2013-02-28 ENCOUNTER — Encounter (HOSPITAL_COMMUNITY)
Admission: RE | Admit: 2013-02-28 | Discharge: 2013-02-28 | Disposition: A | Discharge status: Home / Self Care | Payer: Medicare Other | Source: Ambulatory Visit | Attending: Cardiovascular Disease | Admitting: Cardiovascular Disease

## 2013-03-03 ENCOUNTER — Encounter (HOSPITAL_COMMUNITY)
Admission: RE | Admit: 2013-03-03 | Discharge: 2013-03-03 | Disposition: A | Discharge status: Home / Self Care | Payer: Medicare Other | Source: Ambulatory Visit | Attending: Cardiovascular Disease | Admitting: Cardiovascular Disease

## 2013-03-05 ENCOUNTER — Encounter (HOSPITAL_COMMUNITY)
Admission: RE | Admit: 2013-03-05 | Discharge: 2013-03-05 | Disposition: A | Discharge status: Home / Self Care | Payer: Medicare Other | Source: Ambulatory Visit | Attending: Cardiovascular Disease | Admitting: Cardiovascular Disease

## 2013-03-07 ENCOUNTER — Encounter (HOSPITAL_COMMUNITY)
Admission: RE | Admit: 2013-03-07 | Discharge: 2013-03-07 | Disposition: A | Discharge status: Home / Self Care | Payer: Non-veteran care | Source: Ambulatory Visit | Attending: Cardiovascular Disease | Admitting: Cardiovascular Disease

## 2013-03-07 DIAGNOSIS — I214 Non-ST elevation (NSTEMI) myocardial infarction: Secondary | ICD-10-CM | POA: Insufficient documentation

## 2013-03-07 DIAGNOSIS — I10 Essential (primary) hypertension: Secondary | ICD-10-CM | POA: Insufficient documentation

## 2013-03-07 DIAGNOSIS — I2510 Atherosclerotic heart disease of native coronary artery without angina pectoris: Secondary | ICD-10-CM | POA: Insufficient documentation

## 2013-03-07 DIAGNOSIS — Z5189 Encounter for other specified aftercare: Secondary | ICD-10-CM | POA: Insufficient documentation

## 2013-03-10 ENCOUNTER — Encounter (HOSPITAL_COMMUNITY)
Admission: RE | Admit: 2013-03-10 | Discharge: 2013-03-10 | Disposition: A | Discharge status: Home / Self Care | Payer: Non-veteran care | Source: Ambulatory Visit | Attending: Cardiovascular Disease | Admitting: Cardiovascular Disease

## 2013-03-12 ENCOUNTER — Encounter (HOSPITAL_COMMUNITY): Payer: Non-veteran care

## 2013-03-14 ENCOUNTER — Encounter (HOSPITAL_COMMUNITY)
Admission: RE | Admit: 2013-03-14 | Discharge: 2013-03-14 | Disposition: A | Discharge status: Home / Self Care | Payer: Non-veteran care | Source: Ambulatory Visit | Attending: Cardiovascular Disease | Admitting: Cardiovascular Disease

## 2013-03-17 ENCOUNTER — Encounter (HOSPITAL_COMMUNITY)
Admission: RE | Admit: 2013-03-17 | Discharge: 2013-03-17 | Disposition: A | Discharge status: Home / Self Care | Payer: Non-veteran care | Source: Ambulatory Visit | Attending: Cardiovascular Disease | Admitting: Cardiovascular Disease

## 2013-03-19 ENCOUNTER — Encounter (HOSPITAL_COMMUNITY)
Admission: RE | Admit: 2013-03-19 | Discharge: 2013-03-19 | Disposition: A | Discharge status: Home / Self Care | Payer: Non-veteran care | Source: Ambulatory Visit | Attending: Cardiovascular Disease | Admitting: Cardiovascular Disease

## 2013-03-21 ENCOUNTER — Encounter (HOSPITAL_COMMUNITY)
Admission: RE | Admit: 2013-03-21 | Discharge: 2013-03-21 | Disposition: A | Discharge status: Home / Self Care | Payer: Non-veteran care | Source: Ambulatory Visit | Attending: Cardiovascular Disease | Admitting: Cardiovascular Disease

## 2013-03-24 ENCOUNTER — Encounter (HOSPITAL_COMMUNITY)
Admission: RE | Admit: 2013-03-24 | Discharge: 2013-03-24 | Disposition: A | Discharge status: Home / Self Care | Payer: Non-veteran care | Source: Ambulatory Visit | Attending: Cardiovascular Disease | Admitting: Cardiovascular Disease

## 2013-03-24 ENCOUNTER — Telehealth (HOSPITAL_COMMUNITY): Payer: Self-pay | Admitting: Cardiac Rehabilitation

## 2013-03-24 ENCOUNTER — Encounter: Payer: Self-pay | Admitting: Cardiovascular Disease

## 2013-03-24 ENCOUNTER — Telehealth: Payer: Self-pay | Admitting: Cardiovascular Disease

## 2013-03-24 NOTE — Telephone Encounter (Signed)
Pt made aware of appt scheduled with Huey Bienenstock, [facility] on Tuesday 03/25/13 @ 940am.   Pt advised to present to ED for worsening unrelieved symptoms.  Pt verbalized understanding.  Pt reports he is currently pain free.

## 2013-03-24 NOTE — Progress Notes (Signed)
Pt c/o chest tightness and burning with exertion today at cardiac rehab.  Pt reports this symptom comes and goes with activity.  Relieved immediately  with rest.  Unlike his previous MI symptoms. Pt states he thinks it feels like coming from incision however denies tenderness, redness, swelling.  Symptoms not reproduced while walking track, however occurred with treadmill and stepper which are heavier workloads.  Pt denies symptoms on exertion at home. pc to Dr. Hazle Coca office, left message for triage nurse to return call.

## 2013-03-24 NOTE — Telephone Encounter (Signed)
Spoke w/Joan at Cardiac Rehab.  Pt. C/o chest discomfort w/exertion occasionally resolved w/rest.  Aurea Graff was calling to make an appt for Mr. Wolf.  Will call scheduling

## 2013-03-24 NOTE — Telephone Encounter (Signed)
Pt is at Cardiac Rehab now and is complaining of chest burning with exertion

## 2013-03-25 ENCOUNTER — Encounter: Payer: Self-pay | Admitting: Physician Assistant

## 2013-03-25 ENCOUNTER — Ambulatory Visit (INDEPENDENT_AMBULATORY_CARE_PROVIDER_SITE_OTHER): Payer: Medicare Other | Admitting: Physician Assistant

## 2013-03-25 VITALS — BP 128/66 | HR 50 | Ht 71.0 in | Wt 192.8 lb

## 2013-03-25 DIAGNOSIS — Z951 Presence of aortocoronary bypass graft: Secondary | ICD-10-CM

## 2013-03-25 DIAGNOSIS — R0609 Other forms of dyspnea: Secondary | ICD-10-CM | POA: Diagnosis not present

## 2013-03-25 DIAGNOSIS — R9431 Abnormal electrocardiogram [ECG] [EKG]: Secondary | ICD-10-CM

## 2013-03-25 DIAGNOSIS — I10 Essential (primary) hypertension: Secondary | ICD-10-CM

## 2013-03-25 DIAGNOSIS — E785 Hyperlipidemia, unspecified: Secondary | ICD-10-CM | POA: Diagnosis not present

## 2013-03-25 NOTE — Assessment & Plan Note (Signed)
Blood pressure well controlled on appropriate medications. 

## 2013-03-25 NOTE — Progress Notes (Signed)
Date:  03/25/2013   ID:  [name], DOB [birth year], MRN [id number]  PCP:  Provider Not In System  Primary Cardiologist:  Allyson Sabal    History of Present Illness: [name]. is a 67 y.o. male history of coronary disease and coronary artery bypass grafting in 12/02/2011 after cardiac cath revealed a left main 3 vessel disease with moderate LV dysfunction. He had LIMA to the LAD, vein to the OM branch and RCA. His history also includes hypertension, hyperlipidemia, tobacco abuse (currently stopped), PTSD, DDD of cervical spine.  Patient recurrent episodes of unstable angina he underwent recatheterization in October of 2013 which revealed an occluded vein to the circumflex marginal branch. He then underwent a redo CABG x1 to the circumflex and then he returned in November several times with recurrent angina repeat catheterization revealed a kink in the vessel proximal to the distal M. anastomosis. This was stented in 10/31/2012 patient had resolution in symptoms.   Patient was seen at cardiac rehabilitation and was exercising with some equipment. The patient states that when he does that he tries to push himself harder harder each time. He was experiencing some exertional dyspnea in the uncomfortable feeling in his chest. This feeling quickly resolved upon cessation of exercise. However in the cardiac rehabilitation nurses thought he should be seen in clinic. Patient states that he's been doing tremendous amount of work around the house he showed me pictures of a new deck that he is building that is quite large. He frequently takes breaks and has not experienced any chest pain shortness of breath nausea vomiting, diaphoresis, orthopnea, PND, lower extremity edema.   Wt Readings from Last 3 Encounters:  03/25/13 192 lb 12.8 oz (87.454 kg)  12/26/12 196 lb 13.9 oz (89.3 kg)  11/25/12 185 lb (83.915 kg)     Past Medical History  Diagnosis Date  . Hypertension   . Hypercholesterolemia   .  Anginal pain   . DDD (degenerative disc disease), cervical   . Arthritis     "in my back and neck" (08-30-12)  . Malaria     "I've had it a couple times in the 1960's; almost died from it" (Aug 30, 2012)  . PTSD (post-traumatic stress disorder)   . NSTEMI (non-ST elevated myocardial infarction) 11/2011    anterior  . NSTEMI (non-ST elevated myocardial infarction) August 30, 2012  . Coronary artery disease   . CAD, CABG X 09 Nov 2011. re-do CABG X1 09/19/12 12/04/2011    Current Outpatient Prescriptions  Medication Sig Dispense Refill  . acetaminophen (TYLENOL) 325 MG tablet Take 2 tablets (650 mg total) by mouth every 4 (four) hours as needed.      Marland Kitchen amLODipine (NORVASC) 5 MG tablet Take 1 tablet (5 mg total) by mouth daily.  30 tablet  5  . aspirin EC 325 MG EC tablet Take 1 tablet (325 mg total) by mouth daily.  30 tablet    . atorvastatin (LIPITOR) 10 MG tablet Take 20 mg by mouth daily.       . carvedilol (COREG) 3.125 MG tablet Take 1 tablet (3.125 mg total) by mouth 2 (two) times daily with a meal.  60 tablet  5  . clopidogrel (PLAVIX) 75 MG tablet Take 1 tablet (75 mg total) by mouth daily with breakfast.  30 tablet  11  . ferrous sulfate 325 (65 FE) MG tablet Take 1 tablet (325 mg total) by mouth 3 (three) times daily with meals.  30 tablet  5  .  finasteride (PROSCAR) 5 MG tablet Take 5 mg by mouth daily.      Marland Kitchen HYDROcodone-acetaminophen (NORCO) 10-325 MG per tablet Take 1 tablet by mouth every 6 (six) hours as needed for pain.  30 tablet  0  . nitroGLYCERIN (NITROSTAT) 0.4 MG SL tablet Place 1 tablet (0.4 mg total) under the tongue every 5 (five) minutes x 3 doses as needed for chest pain.  25 tablet  5  . omeprazole (PRILOSEC) 20 MG capsule Take 20 mg by mouth 2 (two) times daily.      Marland Kitchen terazosin (HYTRIN) 1 MG capsule Take 6 mg by mouth at bedtime. Takes one 1mg  tablet and 1 5mg  tablet to total 6mg  daily       No current facility-administered medications for this visit.     Allergies:   No Known Allergies  Social History:  The patient  reports that he quit smoking about 15 months ago. His smoking use included Cigarettes. He has a 25 pack-year smoking history. He has never used smokeless tobacco. He reports that  drinks alcohol. He reports that he does not use illicit drugs.   ROS:  Please see the history of present illness.  All other systems reviewed and negative.   PHYSICAL EXAM: VS:  BP 128/66  Pulse 50  Ht 5\' 11"  (1.803 m)  Wt 192 lb 12.8 oz (87.454 kg)  BMI 26.9 kg/m2 Well nourished, well developed, in no acute distress HEENT: Pupils are equal round react to light accommodation extraocular movements are intact.  Neck: No cervical lymphadenopathy. Cardiac: Regular rate and rhythm without murmurs rubs or gallops. Lungs:  clear to auscultation bilaterally, no wheezing, rhonchi or rales Ext: no lower extremity edema.  2+ radial and 1+ dorsalis pedis pulses. Skin: warm and dry Neuro:  Grossly normal  EKG:  Sinus bradycardia rate of 47 beats per minute likely left atrial enlargement.  ASSESSMENT AND PLAN:  Problem List Items Addressed This Visit   S/P CABG x 3: LIMA TO LAD, SVG TO OM, SVG TO RCA., VG to OM occluded, jeopardized LCX, by cath 08/29/12 -- Re-Do CABG with SVG-OM (09/2012) (Chronic)     Currently treated with aspirin, Plavix, beta blocker, statin    HTN (hypertension) (Chronic)     Blood pressure well controlled on appropriate medications.    Dyslipidemia (Chronic)     Treated with Lipitor    Dyspnea on exertion - Primary     Likely related to extra effort put forth during cardiac rehabilitation exercise.  Feeling resolves upon cessation of exercise. I do not think this is cardiac in nature.    Abnormal EKG, marked diffuse downsloping ST depression,ST elevation AVR and V1   Relevant Orders      EKG 12-Lead

## 2013-03-25 NOTE — Assessment & Plan Note (Signed)
Treated with Lipitor 

## 2013-03-25 NOTE — Assessment & Plan Note (Signed)
Likely related to extra effort put forth during cardiac rehabilitation exercise.  Feeling resolves upon cessation of exercise. I do not think this is cardiac in nature.

## 2013-03-25 NOTE — Patient Instructions (Signed)
Followup in 6 months with Dr. Allyson Sabal

## 2013-03-25 NOTE — Assessment & Plan Note (Signed)
Currently treated with aspirin, Plavix, beta blocker, statin

## 2013-03-26 ENCOUNTER — Encounter (HOSPITAL_COMMUNITY)
Admission: RE | Admit: 2013-03-26 | Discharge: 2013-03-26 | Disposition: A | Discharge status: Home / Self Care | Payer: Non-veteran care | Source: Ambulatory Visit | Attending: Cardiovascular Disease | Admitting: Cardiovascular Disease

## 2013-03-28 ENCOUNTER — Encounter (HOSPITAL_COMMUNITY): Payer: Non-veteran care

## 2013-04-02 ENCOUNTER — Encounter (HOSPITAL_COMMUNITY)
Admission: RE | Admit: 2013-04-02 | Discharge: 2013-04-02 | Disposition: A | Discharge status: Home / Self Care | Payer: Non-veteran care | Source: Ambulatory Visit | Attending: Cardiovascular Disease | Admitting: Cardiovascular Disease

## 2013-04-04 ENCOUNTER — Encounter (HOSPITAL_COMMUNITY)
Admission: RE | Admit: 2013-04-04 | Discharge: 2013-04-04 | Disposition: A | Discharge status: Home / Self Care | Payer: Non-veteran care | Source: Ambulatory Visit | Attending: Cardiovascular Disease | Admitting: Cardiovascular Disease

## 2013-04-07 ENCOUNTER — Encounter (HOSPITAL_COMMUNITY): Payer: Self-pay

## 2013-04-07 ENCOUNTER — Encounter (HOSPITAL_COMMUNITY)
Admission: RE | Admit: 2013-04-07 | Discharge: 2013-04-07 | Disposition: A | Discharge status: Home / Self Care | Payer: Medicare Other | Source: Ambulatory Visit | Attending: Cardiovascular Disease | Admitting: Cardiovascular Disease

## 2013-04-07 DIAGNOSIS — Z5189 Encounter for other specified aftercare: Secondary | ICD-10-CM | POA: Insufficient documentation

## 2013-04-07 DIAGNOSIS — I10 Essential (primary) hypertension: Secondary | ICD-10-CM | POA: Insufficient documentation

## 2013-04-07 DIAGNOSIS — I214 Non-ST elevation (NSTEMI) myocardial infarction: Secondary | ICD-10-CM | POA: Insufficient documentation

## 2013-04-07 DIAGNOSIS — I2510 Atherosclerotic heart disease of native coronary artery without angina pectoris: Secondary | ICD-10-CM | POA: Insufficient documentation

## 2013-04-21 ENCOUNTER — Encounter (HOSPITAL_COMMUNITY)
Admission: RE | Admit: 2013-04-21 | Discharge: 2013-04-21 | Disposition: A | Discharge status: Home / Self Care | Payer: Self-pay | Source: Ambulatory Visit | Attending: Cardiovascular Disease | Admitting: Cardiovascular Disease

## 2013-04-21 DIAGNOSIS — I252 Old myocardial infarction: Secondary | ICD-10-CM | POA: Insufficient documentation

## 2013-04-21 DIAGNOSIS — I2510 Atherosclerotic heart disease of native coronary artery without angina pectoris: Secondary | ICD-10-CM | POA: Insufficient documentation

## 2013-04-21 DIAGNOSIS — Z5189 Encounter for other specified aftercare: Secondary | ICD-10-CM | POA: Insufficient documentation

## 2013-04-21 DIAGNOSIS — I10 Essential (primary) hypertension: Secondary | ICD-10-CM | POA: Insufficient documentation

## 2013-04-21 NOTE — Progress Notes (Signed)
Today was patients first day of Cardiac rehab maintenance exercise. He graduated from the telemetry program approximately two weeks ago.  He experienced onset of Chest pain while exercising on the stepper, first station of exercise. He lowered his workload from level 5 to level 4 but continued to exercise with angina. Upon arriving to patient and he communicating onset of chest pain, had him stop exercise and rest. BP  158/80 HR 72 bpm. He said he had slight radiation to the jaw when he was at level 5.  He rated pain level "5 to 6".  Reinforced to patient to stop activity whenever experiencing chest pressure/pain and to notify staff. He agreed he should have stopped. After approximately one minute of rest pain was gone.  He admitted to starting out too fast after being off a couple weeks. He worked on a deck over the weekend and did not experience any chest pain.  He said he has occasional periods of chest pressure which he said Dr. Allyson Sabal is aware.  Patient does carry his NTG with him.

## 2013-04-22 ENCOUNTER — Telehealth (HOSPITAL_COMMUNITY): Payer: Self-pay | Admitting: Physician Assistant

## 2013-04-22 ENCOUNTER — Encounter: Payer: Self-pay | Admitting: Physician Assistant

## 2013-04-22 ENCOUNTER — Ambulatory Visit (INDEPENDENT_AMBULATORY_CARE_PROVIDER_SITE_OTHER): Payer: Medicare Other | Admitting: Physician Assistant

## 2013-04-22 ENCOUNTER — Encounter (HOSPITAL_COMMUNITY)
Admission: RE | Admit: 2013-04-22 | Discharge: 2013-04-22 | Disposition: A | Discharge status: Home / Self Care | Payer: Self-pay | Source: Ambulatory Visit | Attending: Cardiovascular Disease | Admitting: Cardiovascular Disease

## 2013-04-22 VITALS — BP 158/86 | HR 54 | Ht 70.0 in | Wt 192.0 lb

## 2013-04-22 DIAGNOSIS — I2089 Other forms of angina pectoris: Secondary | ICD-10-CM | POA: Insufficient documentation

## 2013-04-22 DIAGNOSIS — I208 Other forms of angina pectoris: Secondary | ICD-10-CM | POA: Insufficient documentation

## 2013-04-22 DIAGNOSIS — R079 Chest pain, unspecified: Secondary | ICD-10-CM | POA: Diagnosis not present

## 2013-04-22 DIAGNOSIS — I200 Unstable angina: Secondary | ICD-10-CM

## 2013-04-22 DIAGNOSIS — Z951 Presence of aortocoronary bypass graft: Secondary | ICD-10-CM | POA: Diagnosis not present

## 2013-04-22 DIAGNOSIS — I10 Essential (primary) hypertension: Secondary | ICD-10-CM

## 2013-04-22 DIAGNOSIS — I209 Angina pectoris, unspecified: Secondary | ICD-10-CM

## 2013-04-22 MED ORDER — ISOSORBIDE MONONITRATE ER 30 MG PO TB24: 30.0000 mg | ORAL_TABLET | Freq: Every day | ORAL | Status: DC

## 2013-04-22 NOTE — Patient Instructions (Addendum)
Start taking Imdur 30 mg daily.  Call our office immediately if he notices worsening symptoms.

## 2013-04-22 NOTE — Progress Notes (Signed)
Date:  04/22/2013   ID:  [name], DOB 1945-12-09, MRN [id number]  PCP:  Provider Not In System  Primary Cardiologist:  Allyson Sabal    History of Present Illness: [name]. is a 67 y.o. male history of coronary disease and coronary artery bypass grafting in 12/02/2011 after cardiac cath revealed a left main 3 vessel disease with moderate LV dysfunction. He had LIMA to the LAD, vein to the OM branch and RCA. His history also includes hypertension, hyperlipidemia, tobacco abuse (currently stopped), PTSD, DDD of cervical spine.  Patient recurrent episodes of unstable angina he underwent recatheterization in October of 2013 which revealed an occluded vein to the circumflex marginal branch. He then underwent a redo CABG x1 to the circumflex and then he returned in November several times with recurrent angina repeat catheterization revealed a kink in the vessel proximal to the distal M. anastomosis. This was stented in 10/31/2012 patient had resolution in symptoms.   Patient was seen at cardiac rehabilitation yesterday and while exercising he developed 5/10 chest pain which radiated to his neck. He states the pain that quickly resolved in approximately 1-2 minutes. He also reports on his way to cardiac rehabilitation today while walking rather quickly to the facility he developed a 1/10 chest pain which again resolved with rest.  The cardiac rehabilitation nurses thought he should be seen in clinic.  In the clinic now he denies nausea, vomiting, fever, chest pain, shortness of breath, orthopnea, dizziness, PND, cough, congestion, abdominal pain, hematochezia, melena, lower extremity edema, claudication.     Wt Readings from Last 3 Encounters:  04/22/13 192 lb (87.091 kg)  03/25/13 192 lb 12.8 oz (87.454 kg)  12/26/12 196 lb 13.9 oz (89.3 kg)     Past Medical History  Diagnosis Date  . Hypertension   . Hypercholesterolemia   . Anginal pain   . DDD (degenerative disc disease), cervical    . Arthritis     "in my back and neck" (2012-09-03)  . Malaria     "I've had it a couple times in the 1960's; almost died from it" (03-Sep-2012)  . PTSD (post-traumatic stress disorder)   . NSTEMI (non-ST elevated myocardial infarction) 11/2011    anterior  . NSTEMI (non-ST elevated myocardial infarction) 2012/09/03  . Coronary artery disease   . CAD, CABG X 09 Nov 2011. re-do CABG X1 09/19/12 12/04/2011    Current Outpatient Prescriptions  Medication Sig Dispense Refill  . acetaminophen (TYLENOL) 325 MG tablet Take 2 tablets (650 mg total) by mouth every 4 (four) hours as needed.      Marland Kitchen amLODipine (NORVASC) 5 MG tablet Take 1 tablet (5 mg total) by mouth daily.  30 tablet  5  . aspirin EC 325 MG EC tablet Take 1 tablet (325 mg total) by mouth daily.  30 tablet    . atorvastatin (LIPITOR) 10 MG tablet Take 20 mg by mouth daily.       . carvedilol (COREG) 3.125 MG tablet Take 1 tablet (3.125 mg total) by mouth 2 (two) times daily with a meal.  60 tablet  5  . clopidogrel (PLAVIX) 75 MG tablet Take 1 tablet (75 mg total) by mouth daily with breakfast.  30 tablet  11  . ferrous sulfate 325 (65 FE) MG tablet Take 1 tablet (325 mg total) by mouth 3 (three) times daily with meals.  30 tablet  5  . finasteride (PROSCAR) 5 MG tablet Take 5 mg by mouth daily.      Marland Kitchen  HYDROcodone-acetaminophen (NORCO) 10-325 MG per tablet Take 1 tablet by mouth every 6 (six) hours as needed for pain.  30 tablet  0  . nitroGLYCERIN (NITROSTAT) 0.4 MG SL tablet Place 1 tablet (0.4 mg total) under the tongue every 5 (five) minutes x 3 doses as needed for chest pain.  25 tablet  5  . omeprazole (PRILOSEC) 20 MG capsule Take 20 mg by mouth 2 (two) times daily.      Marland Kitchen terazosin (HYTRIN) 10 MG capsule Take 10 mg by mouth at bedtime.      . isosorbide mononitrate (IMDUR) 30 MG 24 hr tablet Take 1 tablet (30 mg total) by mouth daily.  90 tablet  3   No current facility-administered medications for this visit.    Allergies:    No Known Allergies  Social History:  The patient  reports that he quit smoking about 16 months ago. His smoking use included Cigarettes. He has a 25 pack-year smoking history. He has never used smokeless tobacco. He reports that  drinks alcohol. He reports that he does not use illicit drugs.   ROS:  Please see the history of present illness.  All other systems reviewed and negative.   PHYSICAL EXAM: VS:  BP 158/86  Pulse 54  Ht 5\' 10"  (1.778 m)  Wt 192 lb (87.091 kg)  BMI 27.55 kg/m2 Well nourished, well developed, in no acute distress HEENT: Pupils are equal round react to light accommodation extraocular movements are intact.  Neck: No cervical lymphadenopathy. Cardiac: Regular rate and rhythm without murmurs rubs or gallops. Lungs:  clear to auscultation bilaterally, no wheezing, rhonchi or rales Ext: no lower extremity edema.  2+ radial and 1+ dorsalis pedis pulses. Skin: warm and dry Neuro:  Grossly normal  EKG:  Sinus bradycardia rate of 54 beats per minute likely left atrial enlargement. Mass and worsening  T-wave inversion in 1 and aVL  ASSESSMENT AND PLAN:  Problem List Items Addressed This Visit   HTN (hypertension) (Chronic)   Relevant Medications      isosorbide mononitrate (IMDUR) 24 hr tablet   S/P CABG x 3: LIMA TO LAD, SVG TO OM, SVG TO RCA., VG to OM occluded, jeopardized LCX, by cath 08/29/12 -- Re-Do CABG with SVG-OM (09/2012) (Chronic)   Stable angina     Patient reported an episode of unstable angina yesterday during cardiac rehabilitation. He chest pressure which was 5/10 in intensity radiating to his jaw. The pain resolved after 1-2 minutes of rest. Start the patient on Imdur 30 mg and check a P2Y12 patient just had a stent placed last fall to his new graft.    Relevant Medications      isosorbide mononitrate (IMDUR) 24 hr tablet    Other Visit Diagnoses   Chest pain    -  Primary    Relevant Medications       isosorbide mononitrate (IMDUR) 24 hr tablet     Other Relevant Orders       EKG 12-Lead       Platelet inhibition p2y12

## 2013-04-22 NOTE — Telephone Encounter (Signed)
The patient said he will get the first prescription filled at [facility].  I will print another one for him to get filled at the [place].  Randal Goens 1:47 PM.

## 2013-04-22 NOTE — Telephone Encounter (Signed)
Pt would like for his Rx of long acting Nitro to be sent to the [facility] in [place]. He will receive the medications for free. Pt saw Judie Grieve today. He stated that he will pick up his first Rx in town and then the others for the [place].

## 2013-04-22 NOTE — Progress Notes (Signed)
Upon arrival to cardiac rehab department this morning patient complained of mild pressure mid-chest area. He said discomfort came on while walking from parking deck to building. Rates 1-2 on pain scale, BP with automatic machine 165/78 HR 60 bpm. No other complaints. After sitting a couple minutes pressure was gone. [facility] and appointment set with Physician Assistant this morning.  Patient left cardiac rehab department pain free with instructions to go directly to appointment.

## 2013-04-22 NOTE — Telephone Encounter (Signed)
Message forwarded to B. Hager, PA-C. 

## 2013-04-22 NOTE — Assessment & Plan Note (Signed)
Patient reported an episode of unstable angina yesterday during cardiac rehabilitation. He chest pressure which was 5/10 in intensity radiating to his jaw. The pain resolved after 1-2 minutes of rest. Start the patient on Imdur 30 mg and check a P2Y12 patient just had a stent placed last fall to his new graft.

## 2013-04-23 ENCOUNTER — Other Ambulatory Visit: Payer: Self-pay | Admitting: *Deleted

## 2013-04-23 DIAGNOSIS — R079 Chest pain, unspecified: Secondary | ICD-10-CM

## 2013-04-23 MED ORDER — ISOSORBIDE MONONITRATE ER 30 MG PO TB24: 30.0000 mg | ORAL_TABLET | Freq: Every day | ORAL | Status: AC

## 2013-04-23 NOTE — Telephone Encounter (Signed)
Rx for isosorbide printed and signed by Dr. Royann Shivers.  Pt here for pick-up per front desk staff.

## 2013-04-24 ENCOUNTER — Encounter (HOSPITAL_COMMUNITY): Payer: Self-pay

## 2013-04-28 ENCOUNTER — Telehealth: Payer: Self-pay | Admitting: Physician Assistant

## 2013-04-28 ENCOUNTER — Encounter (HOSPITAL_COMMUNITY)
Admission: RE | Admit: 2013-04-28 | Discharge: 2013-04-28 | Disposition: A | Discharge status: Home / Self Care | Payer: Self-pay | Source: Ambulatory Visit | Attending: Cardiovascular Disease | Admitting: Cardiovascular Disease

## 2013-04-28 NOTE — Telephone Encounter (Signed)
[name] ordered a PTY12 test-[facility] could not do it-it have to be at the hospital-please fax that order over there please! Fax#-[phone]-would like to have it tomorrow morning!

## 2013-04-28 NOTE — Telephone Encounter (Signed)
Returned call.  Pt informed lab test has been ordered and he can have it drawn at [facility] hospital lab as order is still pending.  Pt verbalized understanding and agreed w/ plan.

## 2013-04-29 ENCOUNTER — Telehealth: Payer: Self-pay | Admitting: *Deleted

## 2013-04-29 ENCOUNTER — Encounter (HOSPITAL_COMMUNITY)
Admission: RE | Admit: 2013-04-29 | Discharge: 2013-04-29 | Disposition: A | Discharge status: Home / Self Care | Payer: Self-pay | Source: Ambulatory Visit | Attending: Cardiovascular Disease | Admitting: Cardiovascular Disease

## 2013-04-29 ENCOUNTER — Other Ambulatory Visit: Payer: Self-pay | Admitting: *Deleted

## 2013-04-29 LAB — PLATELET INHIBITION P2Y12: Platelet Function  P2Y12: 215 [PRU] (ref 194–418)

## 2013-04-29 MED ORDER — PRASUGREL HCL 10 MG PO TABS: 10.0000 mg | ORAL_TABLET | Freq: Every day | ORAL | Status: DC

## 2013-04-29 NOTE — Telephone Encounter (Signed)
Per Dr Allyson Sabal....start effient 10 mg daily with a load of 60mg .  Pt verbalized understanding and will take rx to [facility]

## 2013-04-29 NOTE — Telephone Encounter (Signed)
Message copied by Marella Bile on Tue Apr 29, 2013  3:35 PM ------      Message from: Runell Gess      Created: Tue Apr 29, 2013  2:38 PM       Plavix probably ineffective. He should go back on his prior antiplatelet drug ------

## 2013-04-29 NOTE — Telephone Encounter (Signed)
[facility] lab unable to see order.  Informed RN unable to release order and will have clinic nurse release order for view.  Neill Loft, RN notified.

## 2013-04-29 NOTE — Telephone Encounter (Signed)
i reorderd lab in hospital orders

## 2013-05-01 ENCOUNTER — Encounter (HOSPITAL_COMMUNITY)
Admission: RE | Admit: 2013-05-01 | Discharge: 2013-05-01 | Disposition: A | Discharge status: Home / Self Care | Payer: Self-pay | Source: Ambulatory Visit | Attending: Cardiovascular Disease | Admitting: Cardiovascular Disease

## 2013-05-05 ENCOUNTER — Encounter (HOSPITAL_COMMUNITY)
Admission: RE | Admit: 2013-05-05 | Discharge: 2013-05-05 | Disposition: A | Discharge status: Home / Self Care | Payer: Self-pay | Source: Ambulatory Visit | Attending: Cardiovascular Disease | Admitting: Cardiovascular Disease

## 2013-05-05 ENCOUNTER — Encounter: Payer: Self-pay | Admitting: Cardiology

## 2013-05-05 ENCOUNTER — Ambulatory Visit (INDEPENDENT_AMBULATORY_CARE_PROVIDER_SITE_OTHER): Payer: Medicare Other | Admitting: Cardiology

## 2013-05-05 VITALS — BP 110/64 | HR 63 | Ht 70.0 in | Wt 192.3 lb

## 2013-05-05 DIAGNOSIS — R079 Chest pain, unspecified: Secondary | ICD-10-CM

## 2013-05-05 DIAGNOSIS — I25810 Atherosclerosis of coronary artery bypass graft(s) without angina pectoris: Secondary | ICD-10-CM

## 2013-05-05 DIAGNOSIS — Z77098 Contact with and (suspected) exposure to other hazardous, chiefly nonmedicinal, chemicals: Secondary | ICD-10-CM

## 2013-05-05 DIAGNOSIS — I209 Angina pectoris, unspecified: Secondary | ICD-10-CM

## 2013-05-05 DIAGNOSIS — I208 Other forms of angina pectoris: Secondary | ICD-10-CM

## 2013-05-05 DIAGNOSIS — F4310 Post-traumatic stress disorder, unspecified: Secondary | ICD-10-CM

## 2013-05-05 DIAGNOSIS — Z9189 Other specified personal risk factors, not elsewhere classified: Secondary | ICD-10-CM

## 2013-05-05 DIAGNOSIS — Z95820 Peripheral vascular angioplasty status with implants and grafts: Secondary | ICD-10-CM

## 2013-05-05 DIAGNOSIS — I2089 Other forms of angina pectoris: Secondary | ICD-10-CM | POA: Insufficient documentation

## 2013-05-05 DIAGNOSIS — I2510 Atherosclerotic heart disease of native coronary artery without angina pectoris: Secondary | ICD-10-CM

## 2013-05-05 DIAGNOSIS — Z951 Presence of aortocoronary bypass graft: Secondary | ICD-10-CM

## 2013-05-05 DIAGNOSIS — E785 Hyperlipidemia, unspecified: Secondary | ICD-10-CM

## 2013-05-05 DIAGNOSIS — Z9889 Other specified postprocedural states: Secondary | ICD-10-CM

## 2013-05-05 HISTORY — DX: Peripheral vascular angioplasty status with implants and grafts: Z95.820

## 2013-05-05 NOTE — Patient Instructions (Signed)
We will schedule you for exercise stress test with pictures, this week or first of next.  Do not eat or drink the night before the test.  No cardiac rehab until we have test and you are seen by Dr. Allyson Sabal.  Follow up with Dr. Allyson Sabal in 2-3 weeks for results.

## 2013-05-05 NOTE — Progress Notes (Signed)
Pt in today for cardiac maintenance program (non- telemetry).  Pt reported to rehab staff - Baxter Hire  that he was experiencing chest discomfort 4/10 while on the treadmill.  Pt exercised at his same workloads as previous sessions.  Pt noted that the pain subsided after rest and exercise was stopped.  Per rehab staff he proceeded to his next station which is the stepper with his usual workloads.  Again pt reported that his angina pain in the sternal area returned but not as intense.  RN advised of return of chest discomfort.  Exercise stopped. BP 168/80  Pt is concerned about the return of angina and reported that he has had angina off and on while exercising in rehab not reported to rehab staff event with the addition of Imdur. Pt relayed that his was seen in the office recently by Huey Bienenstock PA and he was started on Imdur 30mg .  Pt has been compliant with this and has not missed any dose.  He notes a slight headache but continues to take the medication as prescribed.  Pt also had blood work completed.  Pt was advised that he needed to take Effient.  Pt was able to get samples from the [place].  Pt started them last week.  Dr. Hazle Coca office called.  Spoke with [facility].  Pt presently pain free and an appointment was made for him to be seen in the office at 9:40.  Pt placed on monitor to obtain ekg strip.  Copy made and given to pt for his appointment.  Pt feels able to drive himself to his appointment BP 124/70 0/10 chest discomfort.

## 2013-05-05 NOTE — Assessment & Plan Note (Signed)
Followed at [facility], will ask for recent records.

## 2013-05-05 NOTE — Assessment & Plan Note (Signed)
Concern for stenosis in this stent -will check stress myoview.

## 2013-05-05 NOTE — Progress Notes (Signed)
05/05/2013   PCP: Provider Not In System  [facility]   Chief Complaint  Patient presents with  . Follow-up    Chest pain x2 weeka when exercising in the Cardiac Rehab maintenance program, lightheadedness when standing up-one episode yesterday    Primary Cardiologist: Dr. Allyson Sabal  HPI:  67 year old white married male was originally seen January 2013 with a STEMI and was found to have 99% ostial left main disease along with LAD disease and right coronary disease. Intra-aortic balloon pump was inserted and patient was taken emergently to the operating room.  He underwent LIMA to the LAD vein graft to the OM and vein graft to the RCA.  He did fairly well until October of 2013 when he was admitted with a non-ST elevation MI.  Cardiac cath in October revealed an occluded vein graft to the OM.  Patent LIMA to the LAD and patent vein graft to the RCA. Patient was treated medically.  Readmitted November 2013 with unstable angina and failure of medical therapy. Other Bartle was consult as an inpatient underwent redo bypass grafting X 1 with vein graft to the OM.  Unfortunately the patient was readmitted a total of 4 more times with either non-ST elevation MI or accelerated angina. Medical therapy was continued cardiac cath did not show acute obstruction. On the cardiac catheterization 10/31/13 patient was found to have stenosis in the vein graft to the ileum and underwent successful PCI and stenting to the vein graft to the OM with non-ST elevation MI.  At that time was a 95% tandem lesion. Xpedition drug-eluting stent was placed.  The patient has done exceptionally well since that time completed cardiac rehabilitation has been able to build that in his backyard has felt very well. Beginning June 17 which is a tough day for him to due to maneuvers in [place] that day.  He started having chest pain basically at cardiac rehabilitation not with exertion at home. He was seen in her office but Wilburt Finlay PA and  Imdur was added to his medical regimen.  The patient states it did help initially but now again only with cardiac rehabilitation he develops chest pain with radiation to his jaws.  It happened again today and he was sent to our office.  No associated symptoms of nausea or diaphoresis, no lightheadedness or dizziness.  Here in the office he has no chest pain currently.  Please note his Plavix has been changed to [facility] and deep to highly elevated P2Y12.   No Known Allergies  Current Outpatient Prescriptions  Medication Sig Dispense Refill  . isosorbide mononitrate (IMDUR) 30 MG 24 hr tablet Take 1 tablet (30 mg total) by mouth daily.  90 tablet  3  . nitroGLYCERIN (NITROSTAT) 0.4 MG SL tablet Place 1 tablet (0.4 mg total) under the tongue every 5 (five) minutes x 3 doses as needed for chest pain.  25 tablet  5  . prasugrel (EFFIENT) 10 MG TABS Take 1 tablet (10 mg total) by mouth daily.  90 tablet  3  . amLODipine (NORVASC) 5 MG tablet Take 1 tablet (5 mg total) by mouth daily.  30 tablet  5  . aspirin EC 325 MG EC tablet Take 1 tablet (325 mg total) by mouth daily.  30 tablet    . atorvastatin (LIPITOR) 10 MG tablet Take 20 mg by mouth daily.       . carvedilol (COREG) 3.125 MG tablet Take 1 tablet (3.125 mg total) by mouth 2 (two) times  daily with a meal.  60 tablet  5  . ferrous sulfate 325 (65 FE) MG tablet Take 1 tablet (325 mg total) by mouth 3 (three) times daily with meals.  30 tablet  5  . finasteride (PROSCAR) 5 MG tablet Take 5 mg by mouth daily.      Marland Kitchen HYDROcodone-acetaminophen (NORCO) 10-325 MG per tablet Take 1 tablet by mouth every 6 (six) hours as needed for pain.  30 tablet  0  . omeprazole (PRILOSEC) 20 MG capsule Take 20 mg by mouth 2 (two) times daily.      Marland Kitchen terazosin (HYTRIN) 10 MG capsule Take 10 mg by mouth at bedtime.       No current facility-administered medications for this visit.    Past Medical History  Diagnosis Date  . Hypertension   . Hypercholesterolemia   .  Anginal pain   . DDD (degenerative disc disease), cervical   . Arthritis     "in my back and neck" (09/16/12)  . Malaria     "I've had it a couple times in the 1960's; almost died from it" (September 16, 2012)  . PTSD (post-traumatic stress disorder)   . NSTEMI (non-ST elevated myocardial infarction) 11/2011    anterior  . NSTEMI (non-ST elevated myocardial infarction) 09/16/2012  . Coronary artery disease   . CAD, CABG X 09 Nov 2011. re-do CABG X1 09/19/12 12/04/2011  . S/P angioplasty with stent,  10/31/12 VG to OM that was new graft from 11/13 in setting of NSTEMI 05/05/2013    Past Surgical History  Procedure Laterality Date  . Back surgery    . Lumbar disc surgery  1980's?  . Tonsillectomy      "as a kid" (09-16-12)  . Cataract extraction w/ intraocular lens  implant, bilateral  1990's  . Coronary artery bypass graft  12/02/2011    Procedure: CORONARY ARTERY BYPASS GRAFTING (CABG);  Surgeon: Alleen Borne, MD;  Location: [facility] OR;  Service: Open Heart Surgery;  Laterality: N/A;  . Coronary artery bypass graft  09/19/2012    Procedure: REDO CORONARY ARTERY BYPASS GRAFTING (CABG);  Surgeon: Alleen Borne, MD;  Location: [facility] OR;  Service: Open Heart Surgery;  Laterality: N/A;  . Cardiac catheterization  11/2011  . Coronary angioplasty  10/2012    stent placed SVG to OM   . US echocardiography  02/29/2012  . Transthoracic echocardiogram  10/18/2012    LV EF 40%- 45%     ZOX:WRUEAVW:UJ colds or fevers, no weight changes Skin:no rashes or ulcers HEENT:no blurred vision, no congestion CV:see HPI PUL:see HPI GI:no diarrhea constipation or melena, no indigestion GU:no hematuria, no dysuria MS:no joint pain, no claudication Neuro:no syncope, no lightheadedness Endo:no diabetes, no thyroid disease Pysch:  Patient has counseling for his psychological trauma from [place] and prefers not to use medication and has actually controlled the stress very well.   he is not sure if that is playing a  role in his chest pain currently.  PHYSICAL EXAM BP 110/64  Pulse 63  Ht 5\' 10"  (1.778 m)  Wt 192 lb 4.8 oz (87.227 kg)  BMI 27.59 kg/m2 General:Pleasant affect, NAD Skin:Warm and dry, brisk capillary refill HEENT:normocephalic, sclera clear, mucus membranes moist Neck:supple, no JVD, no bruits  Heart:S1S2 RRR with systolic aortic outflow murmur, no gallup, rub or click Lungs:clear without rales, rhonchi, or wheezes WJX:BJYN, non tender, + BS, do not palpate liver spleen or masses Ext:no lower ext edema, 2+ pedal pulses, 2+ radial pulses Neuro:alert and oriented,  MAE, follows commands, + facial symmetry  EKG: Sinus rhythm rate of 63 possible left atrial enlargement left ventricular hypertrophy but no acute changes from old tracings.  ASSESSMENT AND PLAN Angina effort Has chest pain only at cardiac rehab.  He is active at home but does not have pain.  This is his second visit with the angina, Imdur was started and initially may have helped but not any more.  Concern for reocclusion of new stent.  Recent P2Y12 was abnormal and plavix changed to Effient.  Discussed with Dr. Cyndie Chime and will order stress myoview.  We can evaluate any EKG changes with stress and have assistance of myoview for ischemia as well with His complex history.   He does have increased stress this time of year secondary to [place] experience.  S/P CABG x 3 11/2011: LIMA TO LAD, SVG TO OM, SVG TO RCA., VG to OM occluded, jeopardized LCX, by cath 08/29/12 -- Re-Do CABG with SVG-OM (09/2012) After re-do CABG he had 3 NSTEMIs and stable caths until 10/2012 underwent PCI.   S/P angioplasty with stent,  10/31/12 VG to OM that was new graft from 11/13 in setting of NSTEMI Concern for stenosis in this stent -will check stress myoview.  Dyslipidemia On Lipitor 20 mg will need Lipid panel in next couple of months, after stress test.  Stress disorder, post traumatic, from experiences in [place], controlled He prefers not to  take medication, he has had counseling but around June 17th of each year memories do bring a certain amount of stress.  Hx of agent Orange exposure, he is followed at the [place] Followed at [place], will ask for recent records.   He has had a stress test at the [place] sometime this year we will obtain records from the [place] to compare it to his stress test now with chest pain.  I have asked him not to return to cardiac rehabilitation maintenance until we can evaluate the stress test. He'll follow up with Dr. Allyson Sabal for results.  We'll not change medications at this time.

## 2013-05-05 NOTE — Assessment & Plan Note (Signed)
Has chest pain only at cardiac rehab.  He is active at home but does not have pain.  This is his second visit with the angina, Imdur was started and initially may have helped but not any more.  Concern for reocclusion of new stent.  Recent P2Y12 was abnormal and plavix changed to Effient.  Discussed with Dr. Cyndie Chime and will order stress myoview.  We can evaluate any EKG changes with stress and have assistance of myoview for ischemia as well with His complex history.   He does have increased stress this time of year secondary to [place] experience.

## 2013-05-05 NOTE — Assessment & Plan Note (Signed)
On Lipitor 20 mg will need Lipid panel in next couple of months, after stress test.

## 2013-05-05 NOTE — Assessment & Plan Note (Signed)
After re-do CABG he had 3 NSTEMIs and stable caths until 10/2012 underwent PCI.

## 2013-05-05 NOTE — Assessment & Plan Note (Signed)
He prefers not to take medication, he has had counseling but around June 17th of each year memories do bring a certain amount of stress.

## 2013-05-06 ENCOUNTER — Telehealth: Payer: Self-pay | Admitting: Cardiovascular Disease

## 2013-05-06 ENCOUNTER — Encounter (HOSPITAL_COMMUNITY): Payer: Self-pay

## 2013-05-06 DIAGNOSIS — I2510 Atherosclerotic heart disease of native coronary artery without angina pectoris: Secondary | ICD-10-CM | POA: Insufficient documentation

## 2013-05-06 DIAGNOSIS — I252 Old myocardial infarction: Secondary | ICD-10-CM | POA: Insufficient documentation

## 2013-05-06 DIAGNOSIS — I10 Essential (primary) hypertension: Secondary | ICD-10-CM | POA: Insufficient documentation

## 2013-05-06 DIAGNOSIS — Z5189 Encounter for other specified aftercare: Secondary | ICD-10-CM | POA: Insufficient documentation

## 2013-05-06 NOTE — Telephone Encounter (Signed)
Would like some samples of E\fient please!

## 2013-05-06 NOTE — Telephone Encounter (Signed)
Samples of effient #14 given

## 2013-05-06 NOTE — Telephone Encounter (Signed)
effient samples given #14 LOT# [id number] A EXP: 06/2014

## 2013-05-08 ENCOUNTER — Encounter (HOSPITAL_COMMUNITY): Payer: Self-pay

## 2013-05-12 ENCOUNTER — Encounter (HOSPITAL_COMMUNITY): Payer: Self-pay

## 2013-05-13 ENCOUNTER — Ambulatory Visit (HOSPITAL_COMMUNITY)
Admission: RE | Admit: 2013-05-13 | Discharge: 2013-05-13 | Disposition: A | Discharge status: Home / Self Care | Payer: Medicare Other | Source: Ambulatory Visit | Attending: Cardiovascular Disease | Admitting: Cardiovascular Disease

## 2013-05-13 ENCOUNTER — Encounter (HOSPITAL_COMMUNITY): Payer: Self-pay

## 2013-05-13 DIAGNOSIS — Z9861 Coronary angioplasty status: Secondary | ICD-10-CM | POA: Insufficient documentation

## 2013-05-13 DIAGNOSIS — Z951 Presence of aortocoronary bypass graft: Secondary | ICD-10-CM | POA: Insufficient documentation

## 2013-05-13 DIAGNOSIS — E663 Overweight: Secondary | ICD-10-CM | POA: Insufficient documentation

## 2013-05-13 DIAGNOSIS — I5030 Unspecified diastolic (congestive) heart failure: Secondary | ICD-10-CM | POA: Insufficient documentation

## 2013-05-13 DIAGNOSIS — I252 Old myocardial infarction: Secondary | ICD-10-CM | POA: Insufficient documentation

## 2013-05-13 DIAGNOSIS — R079 Chest pain, unspecified: Secondary | ICD-10-CM | POA: Diagnosis not present

## 2013-05-13 DIAGNOSIS — I2510 Atherosclerotic heart disease of native coronary artery without angina pectoris: Secondary | ICD-10-CM | POA: Diagnosis not present

## 2013-05-13 DIAGNOSIS — R42 Dizziness and giddiness: Secondary | ICD-10-CM | POA: Insufficient documentation

## 2013-05-13 DIAGNOSIS — Z8249 Family history of ischemic heart disease and other diseases of the circulatory system: Secondary | ICD-10-CM | POA: Insufficient documentation

## 2013-05-13 DIAGNOSIS — Z87891 Personal history of nicotine dependence: Secondary | ICD-10-CM | POA: Insufficient documentation

## 2013-05-13 DIAGNOSIS — I10 Essential (primary) hypertension: Secondary | ICD-10-CM | POA: Insufficient documentation

## 2013-05-13 DIAGNOSIS — I25810 Atherosclerosis of coronary artery bypass graft(s) without angina pectoris: Secondary | ICD-10-CM

## 2013-05-13 MED ORDER — REGADENOSON 0.4 MG/5ML IV SOLN
0.4000 mg | Freq: Once | INTRAVENOUS | Status: AC
  Administered 2013-05-13: 0.4 mg via INTRAVENOUS

## 2013-05-13 MED ORDER — TECHNETIUM TC 99M SESTAMIBI GENERIC - CARDIOLITE
30.8000 | Freq: Once | INTRAVENOUS | Status: AC | PRN
  Administered 2013-05-13: 30.8 via INTRAVENOUS

## 2013-05-13 MED ORDER — TECHNETIUM TC 99M SESTAMIBI GENERIC - CARDIOLITE
10.2000 | Freq: Once | INTRAVENOUS | Status: AC | PRN
  Administered 2013-05-13: 10 via INTRAVENOUS

## 2013-05-13 NOTE — Procedures (Addendum)
[facility] [address] [place] [address] [phone]  Cardiology Nuclear Med Study  Yogesh Cominsky. is a 67 y.o. male     MRN : [id number]     DOB: 16-Nov-1945  Procedure Date: 05/13/2013  Nuclear Med Background Indication for Stress Test:  Graft Patency and Stent Patency History:  CAD;MI X3--09/2012;STENT/PTCA--10/31/2012;CABG X3--11/26/2011;CABG X1--08/29/2012;DIASTOLIC HEART FAILURE Cardiac Risk Factors: Family History - CAD, History of Smoking, Hypertension, Lipids and Overweight  Symptoms:  Chest Pain and Light-Headedness   Nuclear Pre-Procedure Caffeine/Decaff Intake:  7:00pm NPO After: 7:00am   IV Site: R Antecubital  IV 0.9% NS with Angio Cath:  22g  Chest Size (in):  42"  IV Started by: Emmit Pomfret, RN  Height: 5\' 10"  (1.778 m)  Cup Size: n/a  BMI:  Body mass index is 27.55 kg/(m^2). Weight:  192 lb (87.091 kg)   Tech Comments:  N/A    Nuclear Med Study 1 or 2 day study: 1 day  Stress Test Type:  Lexiscan  Order Authorizing Provider:  Nanetta Batty, MD   Resting Radionuclide: Technetium 36m Sestamibi  Resting Radionuclide Dose: 10.2 mCi   Stress Radionuclide:  Technetium 69m Sestamibi  Stress Radionuclide Dose: 30.8 mCi           Stress Protocol Rest HR: 62 Stress HR: 76  Rest BP: 120/76 Stress BP: 134/76  Exercise Time (min): n/a METS: n/a          Dose of Adenosine (mg):  n/a Dose of Lexiscan: 0.4 mg  Dose of Atropine (mg): n/a Dose of Dobutamine: n/a mcg/kg/min (at max HR)  Stress Test Technologist: Ernestene Mention, CCT Nuclear Technologist: Gonzella Lex, CNMT   Rest Procedure:  Myocardial perfusion imaging was performed at rest 45 minutes following the intravenous administration of Technetium 54m Sestamibi. Stress Procedure:  The patient received IV Lexiscan 0.4 mg over 15-seconds.  Technetium 49m Sestamibi injected at 30-seconds.  There were no significant changes with Lexiscan.   Quantitative spect images were obtained after a 45 minute delay.  Transient Ischemic Dilatation (Normal <1.22):  1.09 Lung/Heart Ratio (Normal <0.45):  0.40 QGS EDV:  111 ml QGS ESV:  63 ml LV Ejection Fraction: 44%  Rest ECG: NSR - Normal EKG  Stress ECG: Significant ST abnormalities consistent with ischemia.  QPS Raw Data Images:  Normal; no motion artifact; normal heart/lung ratio. Stress Images:  Significant anterior and lateral reversible defect Rest Images:  There is decreased uptake in the lateral wall. Subtraction (SDS):  SDS of 8. Extent is 35%.  Impression Exercise Capacity:  Poor exercise capacity. BP Response:  Hypertensive blood pressure response. Clinical Symptoms:  Significant chest pain. ECG Impression:  Significant ST abnormalities consistent with ischemia. Comparison with Prior Nuclear Study: New ischemia compared to prior study.  Overall Impression:  High risk stress nuclear study with ~5 mm horizontal depression in the inferior and lateral leads and 4-5 mm ST elevation in AVR. There is a large anterolateral reversible defect concerning for distal LM or ostial high grade LAD/LCX disease..  LV Wall Motion:  LVEF is 44% with anterolateral hypokinesis.  Chrystie Nose, MD, [facility] Board Certified in Nuclear Cardiology Attending Cardiologist [facility]  Chrystie Nose, MD  05/13/2013 6:29 PM

## 2013-05-15 ENCOUNTER — Encounter (HOSPITAL_COMMUNITY): Payer: Self-pay

## 2013-05-15 ENCOUNTER — Ambulatory Visit (INDEPENDENT_AMBULATORY_CARE_PROVIDER_SITE_OTHER): Payer: Medicare Other | Admitting: Cardiovascular Disease

## 2013-05-15 ENCOUNTER — Encounter: Payer: Self-pay | Admitting: Cardiovascular Disease

## 2013-05-15 ENCOUNTER — Other Ambulatory Visit: Payer: Self-pay | Admitting: Cardiovascular Disease

## 2013-05-15 ENCOUNTER — Other Ambulatory Visit: Payer: Self-pay | Admitting: *Deleted

## 2013-05-15 ENCOUNTER — Ambulatory Visit
Admission: RE | Admit: 2013-05-15 | Discharge: 2013-05-15 | Disposition: A | Discharge status: Home / Self Care | Payer: Medicare Other | Source: Ambulatory Visit | Attending: Cardiovascular Disease | Admitting: Cardiovascular Disease

## 2013-05-15 VITALS — BP 126/68 | HR 68 | Ht 70.0 in | Wt 192.9 lb

## 2013-05-15 DIAGNOSIS — R079 Chest pain, unspecified: Secondary | ICD-10-CM | POA: Diagnosis not present

## 2013-05-15 DIAGNOSIS — Z951 Presence of aortocoronary bypass graft: Secondary | ICD-10-CM

## 2013-05-15 DIAGNOSIS — D689 Coagulation defect, unspecified: Secondary | ICD-10-CM

## 2013-05-15 DIAGNOSIS — Z01811 Encounter for preprocedural respiratory examination: Secondary | ICD-10-CM

## 2013-05-15 DIAGNOSIS — Z79899 Other long term (current) drug therapy: Secondary | ICD-10-CM | POA: Diagnosis not present

## 2013-05-15 DIAGNOSIS — Z01810 Encounter for preprocedural cardiovascular examination: Secondary | ICD-10-CM | POA: Diagnosis not present

## 2013-05-15 DIAGNOSIS — Z01818 Encounter for other preprocedural examination: Secondary | ICD-10-CM

## 2013-05-15 NOTE — Patient Instructions (Signed)
Your physician has requested that you have a cardiac catheterization. Cardiac catheterization is used to diagnose and/or treat various heart conditions. Doctors may recommend this procedure for a number of different reasons. The most common reason is to evaluate chest pain. Chest pain can be a symptom of coronary artery disease (CAD), and cardiac catheterization can show whether plaque is narrowing or blocking your heart's arteries. This procedure is also used to evaluate the valves, as well as measure the blood flow and oxygen levels in different parts of your heart. For further information please visit [email or web address] Please follow instruction sheet, as given.  A chest x-ray takes a picture of the organs and structures inside the chest, including the heart, lungs, and blood vessels. This test can show several things, including, whether the heart is enlarges; whether fluid is building up in the lungs; and whether pacemaker / defibrillator leads are still in place.  Your physician recommends that you return for lab work in: CBC, CMP, PTT, INR/PT

## 2013-05-15 NOTE — Progress Notes (Signed)
05/15/2013 [name]   [birth year]  [id number]  Primary Physician Provider Not In System Primary Cardiologist: [name] [name]   HPI:  The patient is a very pleasant 67 year old mildly overweight married Caucasian male, father of 3 and grandfather to 7 grandchildren, whom I last saw in the office in July. He has a history of CAD, status post coronary artery bypass grafting by Dr. [name] in January of last year after a catheterization performed by me December 02, 2011, revealed left main 3-vessel disease with moderate LV dysfunction. He had a LIMA to his LAD, a vein to an OM branch and to the RCA. His postoperative course was uncomplicated. He did stop smoking at that time. His other problems include hypertension and hyperlipidemia as well as a family history of heart disease. He had recurrent episodes of unstable angina and ultimately underwent recatheterization by Dr. [name] in October of last year revealing occluded vein to the circumflex marginal branch with a total left main. He had a patent LIMA to his LAD and a patent vein to the RCA. It was presumed that his angina and non-STEMIs were related to ischemia in the circumflex territory. He ultimately underwent redo bypass surgery x1 to the circumflex obtuse marginal branch in November and returned several times after that with recurrent angina. Catheterization revealed a kink in the vessel just proximal to the distal anastomosis as well as preanastomotic lesion. I ultimately performed stenting of his newly-placed circumflex marginal vein graft on October 31, 2012, which has resulted in complete resolution of his anginal symptoms. He  participated in cardiac rehab. Recently he developed recurrent chest pain. A Myoview stress test performed on 05/13/13 was read as "high risk" is remarkable for significant anterolateral and inferolateral ischemia.      Current Outpatient Prescriptions  Medication Sig  Dispense Refill  . amLODipine (NORVASC) 5 MG tablet Take 1 tablet (5 mg total) by mouth daily.  30 tablet  5  . aspirin EC 325 MG EC tablet Take 1 tablet (325 mg total) by mouth daily.  30 tablet    . atorvastatin (LIPITOR) 10 MG tablet Take 20 mg by mouth daily.       . carvedilol (COREG) 3.125 MG tablet Take 1 tablet (3.125 mg total) by mouth 2 (two) times daily with a meal.  60 tablet  5  . ferrous sulfate 325 (65 FE) MG tablet Take 1 tablet (325 mg total) by mouth 3 (three) times daily with meals.  30 tablet  5  . finasteride (PROSCAR) 5 MG tablet Take 5 mg by mouth daily.      Marland Kitchen HYDROcodone-acetaminophen (NORCO) 10-325 MG per tablet Take 1 tablet by mouth every 6 (six) hours as needed for pain.  30 tablet  0  . isosorbide mononitrate (IMDUR) 30 MG 24 hr tablet Take 1 tablet (30 mg total) by mouth daily.  90 tablet  3  . nitroGLYCERIN (NITROSTAT) 0.4 MG SL tablet Place 1 tablet (0.4 mg total) under the tongue every 5 (five) minutes x 3 doses as needed for chest pain.  25 tablet  5  . omeprazole (PRILOSEC) 20 MG capsule Take 20 mg by mouth 2 (two) times daily.      . prasugrel (EFFIENT) 10 MG TABS Take 1 tablet (10 mg total) by mouth daily.  90 tablet  3  . terazosin (HYTRIN) 10 MG capsule Take 10 mg by mouth at bedtime.       No current  facility-administered medications for this visit.    No Known Allergies  History   Social History  . Marital Status: Married    Spouse Name: N/A    Number of Children: N/A  . Years of Education: N/A   Occupational History  . Not on file.   Social History Main Topics  . Smoking status: Former Smoker -- 0.50 packs/day for 50 years    Types: Cigarettes    Quit date: 12/02/2011  . Smokeless tobacco: Never Used  . Alcohol Use: No     Comment: 08/29/2012 "haven't had a drink in > 25 years; never had problem w/it"  . Drug Use: No  . Sexually Active: Yes   Other Topics Concern  . Not on file   Social History Narrative   [employer] vet [phone].  History of Agent Orange exposure, followed at [place]     Review of Systems: General: negative for chills, fever, night sweats or weight changes.  Cardiovascular: negative for chest pain, dyspnea on exertion, edema, orthopnea, palpitations, paroxysmal nocturnal dyspnea or shortness of breath Dermatological: negative for rash Respiratory: negative for cough or wheezing Urologic: negative for hematuria Abdominal: negative for nausea, vomiting, diarrhea, bright red blood per rectum, melena, or hematemesis Neurologic: negative for visual changes, syncope, or dizziness All other systems reviewed and are otherwise negative except as noted above.    Blood pressure 126/68, pulse 68, height 5\' 10"  (1.778 m), weight 192 lb 14.4 oz (87.499 kg).  General appearance: alert and no distress Neck: no adenopathy, no carotid bruit, no JVD, supple, symmetrical, trachea midline and thyroid not enlarged, symmetric, no tenderness/mass/nodules Lungs: clear to auscultation bilaterally Heart: regular rate and rhythm, S1, S2 normal, no murmur, click, rub or gallop Extremities: extremities normal, atraumatic, no cyanosis or edema  EKG not performed today  ASSESSMENT AND PLAN:   S/P CABG x 3 11/2011: LIMA TO LAD, SVG TO OM, SVG TO RCA., VG to OM occluded, jeopardized LCX, by cath 08/29/12 -- Re-Do CABG with SVG-OM (09/2012) I performed a PTCI stenting of the circumflex vein graft. He participated in cardiac rehabilitation without any clinical issues. Briefly developed recurrent chest pain and had a Myoview stress test performed 05/13/13 that was read as high risk for anterolateral ischemia. He presents today for elective cardiac catheterization to define his anatomy and rule out an ischemic etiology.      [name] FACP,FACC,FAHA, [facility] 05/15/2013 1:58 PM

## 2013-05-15 NOTE — Assessment & Plan Note (Signed)
I performed a PTCI stenting of the circumflex vein graft. He participated in cardiac rehabilitation without any clinical issues. Briefly developed recurrent chest pain and had a Myoview stress test performed 05/13/13 that was read as high risk for anterolateral ischemia. He presents today for elective cardiac catheterization to define his anatomy and rule out an ischemic etiology.

## 2013-05-16 ENCOUNTER — Encounter (HOSPITAL_COMMUNITY): Payer: Self-pay | Admitting: Pharmacy Technician

## 2013-05-16 ENCOUNTER — Telehealth: Payer: Self-pay | Admitting: Cardiovascular Disease

## 2013-05-16 ENCOUNTER — Encounter (HOSPITAL_COMMUNITY)
Admission: RE | Disposition: A | Discharge status: Home / Self Care | Payer: Self-pay | Source: Ambulatory Visit | Attending: Cardiovascular Disease

## 2013-05-16 ENCOUNTER — Observation Stay (HOSPITAL_COMMUNITY)
Admission: RE | Admit: 2013-05-16 | Discharge: 2013-05-17 | Disposition: A | Discharge status: Home / Self Care | Payer: Medicare Other | Source: Ambulatory Visit | Attending: Cardiovascular Disease | Admitting: Cardiovascular Disease

## 2013-05-16 DIAGNOSIS — E663 Overweight: Secondary | ICD-10-CM | POA: Diagnosis not present

## 2013-05-16 DIAGNOSIS — Z9861 Coronary angioplasty status: Secondary | ICD-10-CM | POA: Diagnosis not present

## 2013-05-16 DIAGNOSIS — I200 Unstable angina: Secondary | ICD-10-CM | POA: Insufficient documentation

## 2013-05-16 DIAGNOSIS — I255 Ischemic cardiomyopathy: Secondary | ICD-10-CM | POA: Diagnosis present

## 2013-05-16 DIAGNOSIS — Z79899 Other long term (current) drug therapy: Secondary | ICD-10-CM | POA: Insufficient documentation

## 2013-05-16 DIAGNOSIS — I10 Essential (primary) hypertension: Secondary | ICD-10-CM | POA: Diagnosis not present

## 2013-05-16 DIAGNOSIS — E785 Hyperlipidemia, unspecified: Secondary | ICD-10-CM | POA: Diagnosis not present

## 2013-05-16 DIAGNOSIS — Z8249 Family history of ischemic heart disease and other diseases of the circulatory system: Secondary | ICD-10-CM | POA: Insufficient documentation

## 2013-05-16 DIAGNOSIS — Z95820 Peripheral vascular angioplasty status with implants and grafts: Secondary | ICD-10-CM

## 2013-05-16 DIAGNOSIS — I2089 Other forms of angina pectoris: Secondary | ICD-10-CM | POA: Diagnosis present

## 2013-05-16 DIAGNOSIS — I25810 Atherosclerosis of coronary artery bypass graft(s) without angina pectoris: Secondary | ICD-10-CM | POA: Diagnosis not present

## 2013-05-16 DIAGNOSIS — I252 Old myocardial infarction: Secondary | ICD-10-CM | POA: Insufficient documentation

## 2013-05-16 DIAGNOSIS — Y831 Surgical operation with implant of artificial internal device as the cause of abnormal reaction of the patient, or of later complication, without mention of misadventure at the time of the procedure: Secondary | ICD-10-CM | POA: Insufficient documentation

## 2013-05-16 DIAGNOSIS — I2510 Atherosclerotic heart disease of native coronary artery without angina pectoris: Secondary | ICD-10-CM | POA: Insufficient documentation

## 2013-05-16 DIAGNOSIS — I2582 Chronic total occlusion of coronary artery: Secondary | ICD-10-CM | POA: Insufficient documentation

## 2013-05-16 DIAGNOSIS — Z7901 Long term (current) use of anticoagulants: Secondary | ICD-10-CM | POA: Insufficient documentation

## 2013-05-16 DIAGNOSIS — R9439 Abnormal result of other cardiovascular function study: Secondary | ICD-10-CM | POA: Diagnosis present

## 2013-05-16 DIAGNOSIS — I208 Other forms of angina pectoris: Secondary | ICD-10-CM | POA: Diagnosis present

## 2013-05-16 DIAGNOSIS — Z01818 Encounter for other preprocedural examination: Secondary | ICD-10-CM

## 2013-05-16 DIAGNOSIS — Z951 Presence of aortocoronary bypass graft: Secondary | ICD-10-CM

## 2013-05-16 HISTORY — PX: LEFT HEART CATHETERIZATION WITH CORONARY ANGIOGRAM: SHX5451

## 2013-05-16 LAB — POCT ACTIVATED CLOTTING TIME: Activated Clotting Time: 381 seconds

## 2013-05-16 SURGERY — LEFT HEART CATHETERIZATION WITH CORONARY ANGIOGRAM
Anesthesia: LOCAL

## 2013-05-16 MED ORDER — ASPIRIN 81 MG PO CHEW
CHEWABLE_TABLET | ORAL | Status: AC
  Administered 2013-05-16: 324 mg via ORAL
  Filled 2013-05-16: qty 4

## 2013-05-16 MED ORDER — ASPIRIN EC 325 MG PO TBEC: 325.0000 mg | DELAYED_RELEASE_TABLET | Freq: Every day | ORAL | Status: DC

## 2013-05-16 MED ORDER — TERAZOSIN HCL 5 MG PO CAPS
10.0000 mg | ORAL_CAPSULE | Freq: Every day | ORAL | Status: DC
Start: 2013-05-16 — End: 2013-05-17
  Administered 2013-05-16: 10 mg via ORAL
  Filled 2013-05-16 (×2): qty 2

## 2013-05-16 MED ORDER — ISOSORBIDE MONONITRATE ER 30 MG PO TB24
30.0000 mg | ORAL_TABLET | Freq: Every day | ORAL | Status: DC
  Administered 2013-05-16 – 2013-05-17 (×2): 30 mg via ORAL
  Filled 2013-05-16 (×2): qty 1

## 2013-05-16 MED ORDER — DIAZEPAM 5 MG PO TABS
ORAL_TABLET | ORAL | Status: AC
  Administered 2013-05-16: 5 mg via ORAL
  Filled 2013-05-16: qty 1

## 2013-05-16 MED ORDER — PRASUGREL HCL 10 MG PO TABS
10.0000 mg | ORAL_TABLET | Freq: Every day | ORAL | Status: DC
  Filled 2013-05-16: qty 1

## 2013-05-16 MED ORDER — SODIUM CHLORIDE 0.9 % IV SOLN
INTRAVENOUS | Status: DC
  Administered 2013-05-16: 12:00:00 via INTRAVENOUS

## 2013-05-16 MED ORDER — ONDANSETRON HCL 4 MG/2ML IJ SOLN: 4.0000 mg | Freq: Four times a day (QID) | INTRAMUSCULAR | Status: DC | PRN

## 2013-05-16 MED ORDER — BIVALIRUDIN 250 MG IV SOLR
INTRAVENOUS | Status: AC
  Filled 2013-05-16: qty 250

## 2013-05-16 MED ORDER — AMLODIPINE BESYLATE 5 MG PO TABS
5.0000 mg | ORAL_TABLET | Freq: Every day | ORAL | Status: DC
Start: 2013-05-16 — End: 2013-05-17
  Administered 2013-05-16 – 2013-05-17 (×2): 5 mg via ORAL
  Filled 2013-05-16 (×2): qty 1

## 2013-05-16 MED ORDER — HYDROCODONE-ACETAMINOPHEN 10-325 MG PO TABS: 1.0000 | ORAL_TABLET | Freq: Four times a day (QID) | ORAL | Status: DC | PRN

## 2013-05-16 MED ORDER — SODIUM CHLORIDE 0.9 % IJ SOLN: 3.0000 mL | Freq: Two times a day (BID) | INTRAMUSCULAR | Status: DC

## 2013-05-16 MED ORDER — SODIUM CHLORIDE 0.9 % IV SOLN
INTRAVENOUS | Status: AC
  Administered 2013-05-16 – 2013-05-17 (×2): via INTRAVENOUS

## 2013-05-16 MED ORDER — ASPIRIN 81 MG PO CHEW: 324.0000 mg | CHEWABLE_TABLET | ORAL | Status: AC

## 2013-05-16 MED ORDER — ACETAMINOPHEN 325 MG PO TABS: 650.0000 mg | ORAL_TABLET | ORAL | Status: DC | PRN

## 2013-05-16 MED ORDER — FENTANYL CITRATE 0.05 MG/ML IJ SOLN
INTRAMUSCULAR | Status: AC
Start: 2013-05-16 — End: 2013-05-16
  Filled 2013-05-16: qty 2

## 2013-05-16 MED ORDER — ASPIRIN EC 325 MG PO TBEC
325.0000 mg | DELAYED_RELEASE_TABLET | Freq: Every day | ORAL | Status: DC
  Administered 2013-05-17: 325 mg via ORAL
  Filled 2013-05-16: qty 1

## 2013-05-16 MED ORDER — LIDOCAINE HCL (PF) 1 % IJ SOLN
INTRAMUSCULAR | Status: AC
  Filled 2013-05-16: qty 30

## 2013-05-16 MED ORDER — HYDRALAZINE HCL 20 MG/ML IJ SOLN
INTRAMUSCULAR | Status: AC
  Filled 2013-05-16: qty 1

## 2013-05-16 MED ORDER — SODIUM CHLORIDE 0.9 % IV SOLN
0.2500 mg/kg/h | INTRAVENOUS | Status: DC
  Filled 2013-05-16: qty 250

## 2013-05-16 MED ORDER — CARVEDILOL 3.125 MG PO TABS
3.1250 mg | ORAL_TABLET | Freq: Two times a day (BID) | ORAL | Status: DC
  Administered 2013-05-16 – 2013-05-17 (×2): 3.125 mg via ORAL
  Filled 2013-05-16 (×4): qty 1

## 2013-05-16 MED ORDER — HYDRALAZINE HCL 20 MG/ML IJ SOLN
10.0000 mg | INTRAMUSCULAR | Status: DC
  Filled 2013-05-16: qty 0.5

## 2013-05-16 MED ORDER — PANTOPRAZOLE SODIUM 40 MG PO TBEC
40.0000 mg | DELAYED_RELEASE_TABLET | Freq: Every day | ORAL | Status: DC
  Administered 2013-05-16 – 2013-05-17 (×2): 40 mg via ORAL
  Filled 2013-05-16 (×2): qty 1

## 2013-05-16 MED ORDER — PRASUGREL HCL 10 MG PO TABS
ORAL_TABLET | ORAL | Status: AC
  Filled 2013-05-16: qty 6

## 2013-05-16 MED ORDER — SODIUM CHLORIDE 0.9 % IJ SOLN: 3.0000 mL | INTRAMUSCULAR | Status: DC | PRN

## 2013-05-16 MED ORDER — PRASUGREL HCL 10 MG PO TABS
10.0000 mg | ORAL_TABLET | Freq: Every day | ORAL | Status: DC
  Administered 2013-05-16 – 2013-05-17 (×2): 10 mg via ORAL
  Filled 2013-05-16 (×2): qty 1

## 2013-05-16 MED ORDER — SODIUM CHLORIDE 0.9 % IV SOLN: 250.0000 mL | INTRAVENOUS | Status: DC | PRN

## 2013-05-16 MED ORDER — FERROUS SULFATE 325 (65 FE) MG PO TABS
325.0000 mg | ORAL_TABLET | Freq: Three times a day (TID) | ORAL | Status: DC
  Administered 2013-05-16 – 2013-05-17 (×2): 325 mg via ORAL
  Filled 2013-05-16 (×5): qty 1

## 2013-05-16 MED ORDER — HEPARIN (PORCINE) IN NACL 2-0.9 UNIT/ML-% IJ SOLN
INTRAMUSCULAR | Status: AC
  Filled 2013-05-16: qty 1500

## 2013-05-16 MED ORDER — ATORVASTATIN CALCIUM 20 MG PO TABS
20.0000 mg | ORAL_TABLET | Freq: Every day | ORAL | Status: DC
  Administered 2013-05-16 – 2013-05-17 (×2): 20 mg via ORAL
  Filled 2013-05-16 (×2): qty 1

## 2013-05-16 MED ORDER — FINASTERIDE 5 MG PO TABS
5.0000 mg | ORAL_TABLET | Freq: Every day | ORAL | Status: DC
  Administered 2013-05-16 – 2013-05-17 (×2): 5 mg via ORAL
  Filled 2013-05-16 (×2): qty 1

## 2013-05-16 MED ORDER — MORPHINE SULFATE 2 MG/ML IJ SOLN: 2.0000 mg | INTRAMUSCULAR | Status: DC | PRN

## 2013-05-16 MED ORDER — DIAZEPAM 5 MG PO TABS: 5.0000 mg | ORAL_TABLET | ORAL | Status: AC

## 2013-05-16 MED ORDER — NITROGLYCERIN 0.4 MG SL SUBL: 0.4000 mg | SUBLINGUAL_TABLET | SUBLINGUAL | Status: DC | PRN

## 2013-05-16 MED ORDER — MIDAZOLAM HCL 2 MG/2ML IJ SOLN
INTRAMUSCULAR | Status: AC
  Filled 2013-05-16: qty 2

## 2013-05-16 MED ORDER — NITROGLYCERIN 0.2 MG/ML ON CALL CATH LAB
INTRAVENOUS | Status: AC
  Filled 2013-05-16: qty 1

## 2013-05-16 NOTE — Telephone Encounter (Signed)
Please check and see if we have the lab results that were drawn. Pt is going to have  Procedure today at 2:00 they need to know if they need to draw labs

## 2013-05-16 NOTE — Progress Notes (Signed)
Site area: right groin  Site Prior to Removal:  Level 0  Pressure Applied For 20 MINUTES    Minutes Beginning at 1840  Manual:   yes  Patient Status During Pull:  stable  Post Pull Groin Site:  Level 0  Post Pull Instructions Given:  yes  Post Pull Pulses Present:  yes  Dressing Applied:  yes  Comments:  Gauze pressure dressing applied. Rechecked site at 1930. No change noted in site gauze dressing dry and intact.

## 2013-05-16 NOTE — CV Procedure (Signed)
[name]. is a 67 y.o. male    [id number] LOCATION:  FACILITY: [facility]  PHYSICIAN: Nanetta Batty, M.D. Feb 25, 1946   DATE OF PROCEDURE:  05/16/2013  DATE OF DISCHARGE:   CARDIAC CATHETERIZATION     History obtained from chart review.The patient is a very pleasant 67 year old mildly overweight married Caucasian male, father of 3 and grandfather to 7 grandchildren, whom I last saw in the office in July. He has a history of CAD, status post coronary artery bypass grafting by Dr. [name] in January of last year after a catheterization performed by me December 02, 2011, revealed left main 3-vessel disease with moderate LV dysfunction. He had a LIMA to his LAD, a vein to an OM branch and to the RCA. His postoperative course was uncomplicated. He did stop smoking at that time. His other problems include hypertension and hyperlipidemia as well as a family history of heart disease. He had recurrent episodes of unstable angina and ultimately underwent recatheterization by Dr. [name] in October of last year revealing occluded vein to the circumflex marginal branch with a total left main. He had a patent LIMA to his LAD and a patent vein to the RCA. It was presumed that his angina and non-STEMIs were related to ischemia in the circumflex territory. He ultimately underwent redo bypass surgery x1 to the circumflex obtuse marginal branch in November and returned several times after that with recurrent angina. Catheterization revealed a kink in the vessel just proximal to the distal anastomosis as well as preanastomotic lesion. I ultimately performed stenting of his newly-placed circumflex marginal vein graft on October 31, 2012, which has resulted in complete resolution of his anginal symptoms. He participated in cardiac rehab. Recently he developed recurrent chest pain. A Myoview stress test performed on 05/13/13 was read as "high risk" is remarkable for significant anterolateral and inferolateral  ischemia.    PROCEDURE DESCRIPTION:    The patient was brought to the second floor [facility] lab in the postabsorptive state. He was premedicated withValium 5 mg by mouth, IV Versed and fentanyl. His right groin was prepped and shaved in usual sterile fashion. Xylocaine 1% was used for local anesthesia. A 5 French sheath was inserted into the right common femoral  artery using standard Seldinger technique. 5 French right and left Judkins diagnostic catheters along with a 5 French pigtail catheter were used for selective artery angiography, selective vein graft and IMA angiography and left ventriculography. Visipaque dye was used for the entirety of the case. Retrograde aortic, left ventricular and pullback pressures were recorded.   HEMODYNAMICS:    AO SYSTOLIC/AO DIASTOLIC: 172/84   LV SYSTOLIC/LV DIASTOLIC: 176/24  ANGIOGRAPHIC RESULTS:   1. Left main; TOTALLY OCCLUDED AT THE ORIGIN 2. LAD; FILLS BY RETROGRADE FLOW FROM THE ima GRAFT 3. Left circumflex; FILLS BY RETROGRADE FLOW FROM THE lima GRAFT AND CIRCUMFLEX OBTUSE MARGINAL GRAFT.  4. Right coronary artery; patent and dominant competitive flow from the RCA vein graft 5.LIMA TO LAD; widely patent 6. SVG TO PDA and PLA sequentially was widely patent     SVG TO 80% ostial stenosis and 95% distal in-stent restenosis within the SVG to the OM branch 7. Left ventriculography; RAO left ventriculogram was performed using  25 mL of Visipaque dye at 12 mL/second. The overall LVEF estimated  50-55 %  Without wall motion abnormalities  IMPRESSION:[name] has ostial and distal SVG to OM branch high-grade disease contributing to his accelerated chest pain and abnormal  Myoview stress test. We'll proceed with PCI and restenting of the distal SVG for "in-stent restenosis as well as stenting of the ostium of the graft.  Procedure description: The 5 French sheath was exchanged over a wire for a 6 [place] sheath. The patient and orders  received 324 mg of aspirin. I gave him 10 more milligrams of Effient  as well as Angiomax bolus with an ACT of 381. A total of 210 cc was administered to the patient. A 6 French left bypass graft guide catheter damped at the origin and therefore was exchanged for a sidehole catheter. Using an 014/190 cm long [place]  guide wire along with a 2.0 mm x 12 mm long balloon predilatation was performed of the distal "in-stent" restenosis. Following his restenting was performed with a 2.75 mm x 15 mm long Xpedition drug-eluting stent deployed at 16 atmospheres and postdilated with a 3.0 x 15 mm long Lindsay Trek at 16 atmospheres (3.01 mm) resulting in reduction of a 95% stenosis to 0% residual. I then direct stented the ostium of the SVG with a 2.75 x 15 mm long Xpedition drug-eluting stent at 16 atmospheres and postdilated with a 3.25 x 15 mm long Welch Trek At 17 atmospheres (3.36 mm) resulting reduction of 80% ostial stenosis to 0% residual. The patient tolerated the procedure well without hemodynamic or electromyographic sequela.  Overall impression: Successful PCI and stenting of the ostium as well as distal portion of the SVG to the OM using drug-eluting stent, Angiomax, and Effient. Patient will be hydrated overnight. The sheath will be removed in several hours and pressure will be held on the groin. He'll be discharged close office followup.  [name], [facility] 05/16/2013 4:41 PM

## 2013-05-16 NOTE — H&P (Signed)
    Pt was reexamined and existing H & P reviewed. No changes found.  Runell Gess, MD [facility] 05/16/2013 3:00 PM

## 2013-05-17 ENCOUNTER — Encounter: Payer: Self-pay | Admitting: Cardiovascular Disease

## 2013-05-17 ENCOUNTER — Encounter (HOSPITAL_COMMUNITY): Payer: Self-pay | Admitting: *Deleted

## 2013-05-17 DIAGNOSIS — Z9889 Other specified postprocedural states: Secondary | ICD-10-CM

## 2013-05-17 DIAGNOSIS — R9439 Abnormal result of other cardiovascular function study: Secondary | ICD-10-CM

## 2013-05-17 LAB — BASIC METABOLIC PANEL
BUN: 13 mg/dL (ref 6–23)
CO2: 24 mEq/L (ref 19–32)
Chloride: 107 mEq/L (ref 96–112)
GFR calc Af Amer: >90 mL/min (ref >90)
Potassium: 3.8 mEq/L (ref 3.5–5.1)

## 2013-05-17 LAB — CBC
HCT: 39 % (ref 39.0–52.0)
RBC: 4.12 MIL/uL — ABNORMAL LOW (ref 4.22–5.81)
RDW: 13.8 % (ref 11.5–15.5)
WBC: 7.8 10*3/uL (ref 4.0–10.5)

## 2013-05-17 NOTE — Discharge Summary (Signed)
Physician Discharge Summary      Patient ID: [name]. MRN: [id number] DOB/AGE: 1946/02/13 67 y.o.  Admit date: 05/16/2013 Discharge date: 05/17/2013  Discharge Diagnoses:  Principal Problem:   Abnormal nuclear stress test Active Problems:   S/P angioplasty with stent,  10/31/12 VG to OM that was new graft from 11/13 in setting of NSTEMI, NOW 05/16/13 with New DES stent to OM VG of ostium and distal portion   S/P CABG x 3 11/2011: LIMA TO LAD, SVG TO OM, SVG TO RCA., VG to OM occluded, jeopardized LCX, by cath 08/29/12 -- Re-Do CABG with SVG-OM (09/2012)   ICM, EF 40% Jan 2013,  50-55% Oct 2013,  40-45% with apical infarct 10/18/12   Angina effort   Discharged Condition: good  Procedures: 05/16/13 combined cardiac cath with graft visualization by Dr. Allyson Sabal  05/16/13 Successful PCI and stenting of the ostium as well as distal portion of the SVG to the OM using drug-eluting stent    Hospital Course: The patient is a very pleasant 67 year old mildly overweight married Caucasian male, father of 3 and grandfather to 7 grandchildren, whom Dr. Allyson Sabal follows.  He has a history of CAD, status post coronary artery bypass grafting by Dr. [name] in January of last year after a catheterization performed by Dr. Allyson Sabal December 02, 2011, revealed left main 3-vessel disease with moderate LV dysfunction. He had a LIMA to his LAD, a vein to an OM branch and to the RCA. His postoperative course was uncomplicated. He did stop smoking at that time. His other problems include hypertension and hyperlipidemia as well as a family history of heart disease. He had recurrent episodes of unstable angina and ultimately underwent recatheterization by Dr. [name] in October of last year revealing occluded vein to the circumflex marginal branch with a total left main. He had a patent LIMA to his LAD and a patent vein to the RCA. It was presumed that his angina and non-STEMIs were related to ischemia in the  circumflex territory. He ultimately underwent redo bypass surgery x1 to the circumflex obtuse marginal branch in November and returned several times after that with recurrent angina and NSTEMIs. Catheterization revealed a kink in the vessel just proximal to the distal anastomosis as well as preanastomotic lesion. Dr. Allyson Sabal ultimately performed stenting of his newly-placed circumflex marginal vein graft on October 31, 2012, which has resulted in complete resolution of his anginal symptoms. He participated in cardiac rehab. Recently he developed recurrent chest pain. A Myoview stress test performed on 05/13/13 was read as "high risk"  remarkable for significant anterolateral and inferolateral ischemia.   He was brought in electively to undergo cath and was found to have ostial and distal SVG to OM branch high-grade disease contributing to his accelerated chest pain and abnormal Myoview stress test. He underwent successful PCI and stenting of the ostium as well as distal portion of the SVG to the OM using drug-eluting stent, Angiomax, and Effient. Pt tolerated without complications.   By the next AM he was seen by Dr. Rennis Golden and found to be stable, no angina and ready for discharge home.  Consults: None  Significant Diagnostic Studies:  BMET    Component Value Date/Time   NA 138 05/17/2013 0510   K 3.8 05/17/2013 0510   CL 107 05/17/2013 0510   CO2 24 05/17/2013 0510   GLUCOSE 88 05/17/2013 0510   BUN 13 05/17/2013 0510   CREATININE 0.79 05/17/2013 0510   CALCIUM 8.5 05/17/2013  0510   GFRNONAA >90 05/17/2013 0510   GFRAA >90 05/17/2013 0510    CBC    Component Value Date/Time   WBC 7.8 05/17/2013 0510   RBC 4.12* 05/17/2013 0510   HGB 13.2 05/17/2013 0510   HCT 39.0 05/17/2013 0510   PLT 153 05/17/2013 0510   MCV 94.7 05/17/2013 0510   MCH 32.0 05/17/2013 0510   MCHC 33.8 05/17/2013 0510   RDW 13.8 05/17/2013 0510   LYMPHSABS 2.0 10/30/2012 0020   MONOABS 0.9 10/30/2012 0020   EOSABS 0.1 10/30/2012  0020   BASOSABS 0.0 10/30/2012 0020       Discharge Exam: Blood pressure 135/74, pulse 87, temperature 98.1 F (36.7 C), temperature source Oral, resp. rate 20, height 5\' 10"  (1.778 m), weight 189 lb 11.2 oz (86.047 kg), SpO2 100.00%.  AM exam: General appearance: alert and no distress  Neck: no adenopathy, no carotid bruit, no JVD, supple, symmetrical, trachea midline and thyroid not enlarged, symmetric, no tenderness/mass/nodules  Lungs: clear to auscultation bilaterally  Heart: regular rate and rhythm, S1, S2 normal, no murmur, click, rub or gallop  Abdomen: soft, non-tender; bowel sounds normal; no masses, no organomegaly  Extremities: extremities normal, atraumatic, no cyanosis or edema and groin without eccyhmosis, bruit or hematoma  Pulses: 2+ and symmetric  Skin: Skin color, texture, turgor normal. No rashes or lesions  Neurologic: Grossly normal     Disposition: 01-Home or Self Care       Future Appointments Provider Department Dept Phone   05/19/2013 8:15 AM Mc-Cardiac Rehab Maintenance [facility] CARDIAC [facility] [phone]   05/20/2013 8:15 AM Mc-Cardiac Rehab Maintenance [facility] CARDIAC [facility] [phone]   05/22/2013 8:15 AM Mc-Cardiac Rehab Maintenance [facility] CARDIAC [facility] [phone]   05/26/2013 8:15 AM Mc-Cardiac Rehab Maintenance [facility] CARDIAC [facility] [phone]   05/27/2013 8:15 AM Mc-Cardiac Rehab Maintenance [facility] CARDIAC [facility] [phone]   05/29/2013 8:15 AM Mc-Cardiac Rehab Maintenance [facility] CARDIAC [facility] [phone]   05/29/2013 9:45 AM Runell Gess, MD [facility] [place] [phone]   06/02/2013 8:15 AM Mc-Cardiac Rehab Maintenance [facility] CARDIAC [facility] [phone]   06/03/2013 8:15 AM Mc-Cardiac Rehab Maintenance MOSES [facility] CARDIAC [facility] [phone]    06/05/2013 8:15 AM Mc-Cardiac Rehab Maintenance MOSES [facility] CARDIAC [facility] [phone]   06/09/2013 8:15 AM Mc-Cardiac Rehab Maintenance MOSES [facility] CARDIAC [facility] [phone]   06/10/2013 8:15 AM Mc-Cardiac Rehab Maintenance MOSES [facility] CARDIAC [facility] [phone]   06/12/2013 8:15 AM Mc-Cardiac Rehab Maintenance [facility] CARD[facility]AC [facility] [phone]   06/16/2013 8:15 AM Mc-Cardiac Rehab Maintenance MOSES [facility] CARDIAC [facility] [phone]   06/17/2013 8:15 AM Mc-Cardiac Rehab Maintenance MOS[facility] CARDIAC [facility] [phone]   06/19/2013 8:15 AM Mc-Cardiac Rehab Maintenance MOSES [facility] CARDIAC [facility] [phone]   06/23/2013 8:15 AM Mc-Cardiac Rehab Maintenance [facility] CARDIAC [facility] [phone]   06/24/2013 8:15 AM Mc-Cardiac Rehab Maintenance MOSES [facility] CARDIAC [facility] [phone]   06/26/2013 8:15 AM Mc-Cardiac Rehab Maintenance MOSES [facility] CARDIAC [facility] [phone]   06/30/2013 8:15 AM Mc-Cardiac Rehab Maintenance MOSES [facility] CARDIAC [facility] [phone]   07/01/2013 8:15 AM Mc-Cardiac Rehab Maintenance MOSES [facility] CARDIAC [facility] [phone]   07/03/2013 8:15 AM Mc-Cardiac Rehab Maintenance [facility] CARDIAC [facility] [phone]   07/08/2013 8:15 AM Mc-Cardiac Rehab Maintenance [facility] CARDIAC [facility] [phone]   07/10/2013 8:15 AM Mc-Cardiac Rehab Maintenance MOSES [facility] CARDIAC [facility] [phone]   07/14/2013 8:15 AM Mc-Cardiac Rehab Maintenance MOSES [facility] CARDIAC [facility] [phone]   07/15/2013 8:15 AM Mc-Cardiac Rehab Maintenance MOSES [facility] CARDIAC [facility] [phone]   07/17/2013 8:15 AM Mc-Cardiac Rehab Maintenance [facility] CARDIAC [facility] [phone]   07/17/2013 10:00 AM Runell Gess, MD  [facility] [place] [phone]   07/21/2013 8:15 AM Mc-Cardiac Rehab Maintenance MOSES [facility] CARDIAC [facility] [phone]   07/22/2013 8:15 AM Mc-Cardiac Rehab Maintenance [facility] CARDIAC [facility] [phone]   07/24/2013 8:15 AM Mc-Cardiac Rehab Maintenance [facility] CARDIAC [facility] [phone]   07/28/2013 8:15 AM Mc-Cardiac Rehab Maintenance [facility]ES [facility] CARDIAC [facility] [phone]   07/29/2013 8:15 AM Mc-Cardiac Rehab Maintenance MOSES [facility] CARDIAC [facility] [phone]   07/31/2013 8:15 AM Mc-Cardiac Rehab Maintenance [facility] CARDIAC [facility] [phone]   08/04/2013 8:15 AM Mc-Cardiac Rehab Maintenance MOS[facility] CARDIAC [facility] [phone]   08/05/2013 8:15 AM Mc-Cardiac Rehab Maintenance [facility] CARDIAC [address] [facility] [phone]   08/07/2013 8:15 AM Mc-Cardiac Rehab Maintenance MOSES [facility] CARDIAC [facility] [phone]   08/11/2013 8:15 AM Mc-Cardiac Rehab Maintenance MOSES [facility] CARDIAC [facility] [phone]   08/12/2013 8:15 AM Mc-Cardiac Rehab Maintenance [facility] CARDIAC [facility] [phone]   08/14/2013 8:15 AM Mc-Cardiac Rehab Maintenance [facility] CARDIAC [facility] [phone]   08/18/2013 8:15 AM Mc-Cardiac Rehab Maintenance MOSES [facility] CARDIAC [facility] [phone]   08/19/2013 8:15 AM Mc-Cardiac Rehab Maintenance MOSES [facility] CARDIAC [facility] [phone]   08/21/2013 8:15 AM Mc-Cardiac Rehab Maintenance [facility] CARDIAC [facility] [phone]   08/25/2013 8:15 AM Mc-Cardiac Rehab Maintenance MOSES [facility] CARDIAC [facility] [phone]   08/26/2013 8:15 AM Mc-Cardiac Rehab Maintenance [facility] CARDIAC [facility] [phone]   08/28/2013 8:15 AM Mc-Cardiac Rehab Maintenance MOSES [facility]  CARDIAC [facility] [phone]   09/01/2013 8:15 AM Mc-Cardiac Rehab Maintenance [facility]ES [facility] CARDIAC [facility] [phone]   09/02/2013 8:15 AM Mc-Cardiac Rehab Maintenance MOSES [facility] CARDIAC [facility] [phone]   09/04/2013 8:15 AM Mc-Cardiac Rehab Maintenance MOSES [facility] CARDIAC [facility] [phone]   09/08/2013 8:15 AM Mc-Cardiac Rehab Maintenance MOSES [facility] CARDIAC [facility] [phone]   09/09/2013 8:15 AM Mc-Cardiac Rehab Maintenance [facility]ES [facility] CARDIAC [facility] [phone]   09/11/2013 8:15 AM Mc-Cardiac Rehab Maintenance MOSES [facility] CARDIAC [facility] [phone]   09/15/2013 8:15 AM Mc-Cardiac Rehab Maintenance MOSES [facility] CARDIAC [facility] [phone]   09/16/2013 8:15 AM Mc-Cardiac Rehab Maintenance MOSES [facility] CARDIAC [facility] [phone]   09/18/2013 8:15 AM Mc-Cardiac Rehab Maintenance MOSES [facility] CARDIAC [facility] [phone]   09/22/2013 8:15 AM Mc-Cardiac Rehab Maintenance MOSES [facility] CARDIAC [facility] [phone]   09/23/2013 8:15 AM Mc-Cardiac Rehab Maintenance MOSES [facility] CARDIAC [place] [facility] [phone]   09/25/2013 8:15 AM Mc-Cardiac Rehab Maintenance MOSES [facility] CARDIAC [facility] [phone]   09/29/2013 8:15 AM Mc-Cardiac Rehab Maintenance [facility]ES [facility] CARDIAC [facility] [phone]   09/30/2013 8:15 AM Mc-Cardiac Rehab Maintenance MOSES [facility] CARDIAC [facility] [phone]   10/06/2013 8:15 AM Mc-Cardiac Rehab Maintenance MOSES [facility] CARDIAC [facility] [phone]   10/07/2013 8:15 AM Mc-Cardiac Rehab Maintenance MOSES [facility] CARDIAC [facility] [phone]   10/09/2013 8:15 AM Mc-Cardiac Rehab  Maintenance MOSES [facility] CARDIAC [facility] [phone]   10/13/2013 8:15 AM Mc-Cardiac Rehab Maintenance MOSES [facility] CARDIAC [facility] [phone]    10/14/2013 8:15 AM Mc-Cardiac Rehab Maintenance [facility]ES [facility] CARDIAC [facility] [phone]   10/16/2013 8:15 AM Mc-Cardiac Rehab Maintenance [facility] CARDIAC [facility] [phone]   10/20/2013 8:15 AM Mc-Cardiac Rehab Maintenance MOSES [facility] CARDIAC [facility] [phone]   10/21/2013 8:15 AM Mc-Cardiac Rehab Maintenance MOSES [facility] CARDIAC [facility] [phone]   10/23/2013 8:15 AM Mc-Cardiac Rehab Maintenance MOSES [facility] CARDIAC [facility] [phone]   10/27/2013 8:15 AM Mc-Cardiac Rehab Maintenance [facility] CARDIAC [facility] [phone]   10/28/2013 8:15 AM Mc-Cardiac Rehab Maintenance MOSES [facility] CARDIAC [facility] [phone]   11/03/2013 8:15 AM Mc-Cardiac Rehab Maintenance MOSES [facility] CARDIAC [facility] [phone]   11/04/2013 8:15 AM Mc-Cardiac Rehab Maintenance MOSES [facility] CARDIAC [facility] [phone]   11/10/2013 8:15 AM Mc-Cardiac Rehab Maintenance MOSES [facility] CARDIAC [facility] [phone]   11/11/2013 8:15 AM Mc-Cardiac Rehab Maintenance MOSES [facility] CARDIAC [facility] [phone]   11/13/2013 8:15 AM Mc-Cardiac Rehab Maintenance MOSES [facility] CARDIAC [facility] [phone]   11/17/2013 8:15 AM Mc-Cardiac Rehab Maintenance MOSES [facility] CARDIAC [facility] [phone]   11/18/2013 8:15 AM Mc-Cardiac Rehab Maintenance MOSES [facility] CARDIAC [facility] [phone]   11/20/2013 8:15 AM Mc-Cardiac Rehab Maintenance MOSES [facility] CARDIAC [facility] [phone]   11/24/2013 8:15 AM Mc-Cardiac Rehab Maintenance MOSES [facility] CARDIAC [facility] [phone]   11/25/2013 8:15 AM Mc-Cardiac Rehab Maintenance MOSES [facility] CARDIAC [facility] [phone]   11/27/2013 8:15 AM Mc-Cardiac Rehab Maintenance MOSES [facility] CARDIAC [facility] [phone]   12/01/2013 8:15 AM Mc-Cardiac Rehab  Maintenance MOSES [facility] CARDIAC [facility] [phone]   12/02/2013 8:15 AM Mc-Cardiac Rehab Maintenance MOSES [facility] CARDIAC [facility] [phone]   12/04/2013 8:15 AM Mc-Cardiac Rehab Maintenance [facility] CARDIAC [facility] [phone]   12/08/2013 8:15 AM Mc-Cardiac Rehab Maintenance [facility] CARDIAC [facility] [phone]   12/09/2013 8:15 AM Mc-Cardiac Rehab Maintenance MOSES [facility] CARDIAC [facility] [phone]   12/11/2013 8:15 AM Mc-Cardiac Rehab Maintenance MOSES [facility] CARDIAC [facility] [phone]   12/15/2013 8:15 AM Mc-Cardiac Rehab Maintenance MOSES [facility] CARDIAC [facility] [phone]   12/16/2013 8:15 AM Mc-Cardiac Rehab Maintenance MOSES [facility] CARDIAC [facility] [phone]   12/18/2013 8:15 AM Mc-Cardiac Rehab Maintenance MOSES [facility] CARDIAC [facility] [phone]   12/22/2013 8:15 AM Mc-Cardiac Rehab Maintenance MOSES [facility] CARDIAC [facility] [phone]   12/23/2013 8:15 AM Mc-Cardiac Rehab Maintenance MOSES [facility] CARDIAC [facility] [phone]   12/25/2013 8:15 AM Mc-Cardiac Rehab Maintenance MOSES [facility] CARDIAC [facility] [phone]   12/29/2013 8:15 AM Mc-Cardiac Rehab Maintenance MOSES [facility] CARDIAC [facility] [phone]   12/30/2013 8:15 AM Mc-Cardiac Rehab Maintenance MOSES [facility] CARDIAC [facility] [phone]   01/01/2014 8:15 AM Mc-Cardiac Rehab Maintenance MOSES [facility] CARDIAC [facility] [phone]   01/05/2014 8:15 AM Mc-Cardiac Rehab Maintenance MOSES [facility] CARDIAC [facility] [phone]   01/06/2014 8:15 AM Mc-Cardiac Rehab Maintenance [facility] CARDIAC [facility] [phone]   01/08/2014 8:15 AM Mc-Cardiac Rehab Maintenance MOSES [facility] CARDIAC [facility] [phone]   01/12/2014 8:15 AM Mc-Cardiac Rehab Maintenance MOSES [facility] CARDIAC  [facility] [phone]   01/13/2014 8:15 AM Mc-Cardiac Rehab Maintenance MOSES [facility] CARDIAC [facility] [phone]   01/15/2014 8:15  AM Mc-Cardiac Rehab Maintenance MOSES [facility] CARDIAC [facility] [phone]   01/19/2014 8:15 AM Mc-Cardiac Rehab Maintenance MOSES [facility] CARDIAC [facility] [phone]   01/20/2014 8:15 AM Mc-Cardiac Rehab Maintenance MOSES [facility] CARDIAC [facility] [phone]   01/22/2014 8:15 AM Mc-Cardiac Rehab Maintenance MOSES [facility] CARDIAC [facility] [phone]   01/26/2014 8:15 AM Mc-Cardiac Rehab Maintenance MOSES [facility] CARDIAC [facility] [phone]   01/27/2014 8:15 AM Mc-Cardiac Rehab Maintenance [facility] CARDIAC [facility] [phone]   01/29/2014 8:15 AM Mc-Cardiac Rehab Maintenance MOSES [facility] CARDIAC [facility] [phone]   02/02/2014 8:15 AM Mc-Cardiac Rehab Maintenance MOSES [facility] CARDIAC [facility] [phone]   02/03/2014 8:15 AM Mc-Cardiac Rehab Maintenance [facility] CARDIAC [facility] [phone]   02/05/2014 8:15 AM Mc-Cardiac Rehab Maintenance MOSES [facility] CARDIAC [facility] [phone]   02/09/2014 8:15 AM Mc-Cardiac Rehab Maintenance MOSES [facility] CARDIAC [facility] [phone]   02/10/2014 8:15 AM Mc-Cardiac Rehab Maintenance MOSES [facility] CARDIAC [facility] [phone]   02/12/2014 8:15 AM Mc-Cardiac Rehab Maintenance MOSES [facility] CARDIAC [facility] [phone]   02/16/2014 8:15 AM Mc-Cardiac Rehab Maintenance MOSES [facility] CARDIAC [facility] [phone]   02/17/2014 8:15 AM Mc-Cardiac Rehab Maintenance MOSES [facility] CARDIAC [facility] [phone]   02/19/2014 8:15 AM Mc-Cardiac Rehab Maintenance MOSES [facility] CARDIAC [facility] [phone]   02/23/2014 8:15 AM Mc-Cardiac Rehab Maintenance MOSES [facility] CARDIAC [facility] [phone]   02/24/2014 8:15 AM  Mc-Cardiac Rehab Maintenance MOSES [facility] CARDIAC [facility] [phone]   02/26/2014 8:15 AM Mc-Cardiac Rehab Maintenance MOSES [facility] CARDIAC [facility] [phone]   03/02/2014 8:15 AM Mc-Cardiac Rehab Maintenance MOSES [facility] CARDIAC [facility] [phone]   03/03/2014 8:15 AM Mc-Cardiac Rehab Maintenance MOSES [facility] CARDIAC [facility] [phone]   03/05/2014 8:15 AM Mc-Cardiac Rehab Maintenance MOSES [facility] CARDIAC [facility] [phone]   03/09/2014 8:15 AM Mc-Cardiac Rehab Maintenance MOSES [facility] CARDIAC [facility] [phone]   03/10/2014 8:15 AM Mc-Cardiac Rehab Maintenance MOSES [facility] CARDIAC [facility] [phone]   03/12/2014 8:15 AM Mc-Cardiac Rehab Maintenance MOSES [facility] CARDIAC [facility] [phone]   03/16/2014 8:15 AM Mc-Cardiac Rehab Maintenance MOSES [facility] CARDIAC [facility] [phone]   03/17/2014 8:15 AM Mc-Cardiac Rehab Maintenance MOSES [facility] CARDIAC [facility] [phone]   03/19/2014 8:15 AM Mc-Cardiac Rehab Maintenance MOSES [facility] CARDIAC [facility] [phone]   03/23/2014 8:15 AM Mc-Cardiac Rehab Maintenance MOSES [facility] CARDIAC [facility] [phone]   03/24/2014 8:15 AM Mc-Cardiac Rehab Maintenance MOSES [facility] CARDIAC [facility] [phone]   03/26/2014 8:15 AM [facility] CARDIAC [facility] [phone]   03/31/2014 8:15 AM Mc[facility]Cardiac Rehab Maintenance [facility] CARDIAC [facility] [phone]   04/02/2014 8:15 AM Mc-Cardiac Rehab Maintenance [facility] CARDIAC [facility] [phone]   04/06/2014 8:15 AM Mc-Cardiac Rehab Maintenance [facility] CARDIAC [facility] [phone]   04/07/2014 8:15 AM Mc-Cardiac Rehab Maintenance [facility] CARDIAC [facility] [phone]   04/09/2014 8:15 AM Mc-Cardiac Rehab Maintenance [facility] CARDIAC [facility] [phone]   04/13/2014 8:15 AM Mc-Cardiac Rehab Maintenance [facility] CARDIAC [facility] [phone]   04/14/2014 8:15 AM Mc-Cardiac Rehab Maintenance [facility] CARDIAC [facility] [phone]   04/16/2014 8:15 AM Mc-Cardiac Rehab Maintenance [facility] CARDIAC [facility] [phone]   04/20/2014 8:15 AM Mc-Cardiac Rehab Maintenance [facility] CARDIAC [place] [phone]  04/21/2014 8:15 AM Mc-Cardiac Rehab Maintenance [facility] CARDIAC [facility] [phone]   04/23/2014 8:15 AM Mc-Cardiac Rehab Maintenance MOSES [facility] CARDIAC [facility] [phone]   04/27/2014 8:15 AM Mc-Cardiac Rehab Maintenance [facility] CARDIAC [facility] [phone]   04/28/2014 8:15 AM Mc-Cardiac Rehab Maintenance [facility] CARDIAC [facility] [phone]   04/30/2014 8:15 AM Mc-Cardiac Rehab Maintenance MOSES [facility] CARDIAC [facility] [phone]   05/04/2014 8:15 AM Mc-Cardiac Rehab Maintenance [facility] CARDIAC [facility] [phone]   05/05/2014 8:15 AM Mc-Cardiac Rehab Maintenance [facility]ES [facility] CARDIAC REHAB [phone]       Medication List         amLODipine 5 MG tablet  Commonly known as:  NORVASC  Take 1 tablet (5 mg total) by mouth daily.     aspirin 325 MG EC tablet  Take 1 tablet (325 mg total) by mouth daily.     atorvastatin 10 MG tablet  Commonly known as:  LIPITOR  Take 20 mg by mouth daily.     carvedilol 3.125 MG tablet  Commonly known as:  COREG  Take 1 tablet (3.125 mg total) by mouth 2 (two) times daily with a meal.     ferrous sulfate 325 (65 FE) MG tablet  Take 1 tablet (325 mg total) by mouth 3 (three) times daily with meals.     finasteride 5 MG tablet  Commonly known as:  PROSCAR  Take 5 mg by mouth daily.     HYDROcodone-acetaminophen 10-325 MG per tablet  Commonly known as:  NORCO  Take 1  tablet by mouth every 6 (six) hours as needed for pain.     isosorbide mononitrate 30 MG 24 hr tablet  Commonly known as:  IMDUR  Take 1 tablet (30 mg total) by mouth daily.     nitroGLYCERIN 0.4 MG SL tablet  Commonly known as:  NITROSTAT  Place 1 tablet (0.4 mg total) under the tongue every 5 (five) minutes x 3 doses as needed for chest pain.     omeprazole 20 MG capsule  Commonly known as:  PRILOSEC  Take 20 mg by mouth 2 (two) times daily.     prasugrel 10 MG Tabs  Commonly known as:  EFFIENT  Take 1 tablet (10 mg total) by mouth daily.     terazosin 10 MG capsule  Commonly known as:  HYTRIN  Take 10 mg by mouth at bedtime.       Follow-up Information   Follow up with Runell Gess, MD On 05/29/2013. (our office will call to confirm date and time)    Contact information:   [address] [place] [address] [phone]      Discharge Instructions: Call [facility] if any bleeding, swelling or drainage at cath site.  May shower, no tub baths for 48 hours for groin sticks.   No lifting over 10 pounds for 3 days, no strenuous activity , no driving for 3 days.  Do not miss your Effient   Call if any problems No cardiac rehab until you see Dr. Allyson Sabal      Signed: Leone Brand Nurse Practitioner-Certified [facility] 05/17/2013, 12:31 PM  Time spent on discharge :40 minutes.

## 2013-05-17 NOTE — Progress Notes (Signed)
UR Completed.  [name] [name] [phone] 05/17/2013  

## 2013-05-17 NOTE — Progress Notes (Signed)
CARDIAC REHAB PHASE I   PRE:  Rate/Rhythm: Sinus 60  BP:  Supine:  Sitting:  Standing: 110/60   SaO2: 97 Room Air  MODE:  Ambulation: 800 ft   POST:  Rate/Rhythem: Sinus Rhythm 62  BP:  Supine:   Sitting:   Standing: 110/60   SaO2: 100% Room Air  0915-1000 [name] ambulated in the hallway without complaints of chest pain tolerated well. Exercise prescription and discharge education completed. [name] is interested in returning to cardiac rehab maintenance if Dr Allyson Sabal okay's this. 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 Patient's nurse notified of this complaint.   Elmyra Banwart, Arta Bruce RN

## 2013-05-17 NOTE — Progress Notes (Signed)
DAILY PROGRESS NOTE  Subjective:  S/p PCI to the SVG to OM yesterday for high risk nuclear stress test.  He is now asymptomatic.  Objective:  Temp:  [97.2 F (36.2 C)-98.2 F (36.8 C)] 98.1 F (36.7 C) (07/12 0535) Pulse Rate:  [47-87] 87 (07/12 0535) Resp:  [18-20] 20 (07/12 0535) BP: (110-174)/(54-94) 135/74 mmHg (07/12 0535) SpO2:  [98 %-100 %] 100 % (07/12 0535) Weight:  [189 lb 11.2 oz (86.047 kg)-192 lb 14.4 oz (87.5 kg)] 189 lb 11.2 oz (86.047 kg) (07/12 0535) Weight change:   Intake/Output from previous day: 07/11 0701 - 07/12 0700 In: 120 [P.O.:120] Out: 1350 [Urine:1350]  Intake/Output from this shift:    Medications: Current Facility-Administered Medications  Medication Dose Route Frequency Provider Last Rate Last Dose  . acetaminophen (TYLENOL) tablet 650 mg  650 mg Oral Q4H PRN Runell Gess, MD      . amLODipine (NORVASC) tablet 5 mg  5 mg Oral Daily Runell Gess, MD   5 mg at 05/16/13 2030  . aspirin EC tablet 325 mg  325 mg Oral Daily [employer], RPH      . atorvastatin (LIPITOR) tablet 20 mg  20 mg Oral Daily Runell Gess, MD   20 mg at 05/16/13 2030  . carvedilol (COREG) tablet 3.125 mg  3.125 mg Oral BID WC Runell Gess, MD   3.125 mg at 05/16/13 2136  . ferrous sulfate tablet 325 mg  325 mg Oral TID WC Runell Gess, MD   325 mg at 05/16/13 2030  . finasteride (PROSCAR) tablet 5 mg  5 mg Oral Daily Runell Gess, MD   5 mg at 05/16/13 2029  . hydrALAZINE (APRESOLINE) injection 10 mg  10 mg Intravenous UD Runell Gess, MD      . HYDROcodone-acetaminophen [facility]) 10-325 MG per tablet 1 tablet  1 tablet Oral Q6H PRN Runell Gess, MD      . isosorbide mononitrate (IMDUR) 24 hr tablet 30 mg  30 mg Oral Daily Runell Gess, MD   30 mg at 05/16/13 2029  . morphine 2 MG/ML injection 2 mg  2 mg Intravenous Q1H PRN Runell Gess, MD      . nitroGLYCERIN (NITROSTAT) SL tablet 0.4 mg  0.4 mg Sublingual Q5 Min x 3 PRN Runell Gess, MD      . ondansetron [facility]) injection 4 mg  4 mg Intravenous Q6H PRN Runell Gess, MD      . pantoprazole (PROTONIX) EC tablet 40 mg  40 mg Oral Daily Runell Gess, MD   40 mg at 05/16/13 2029  . prasugrel (EFFIENT) tablet 10 mg  10 mg Oral Daily Runell Gess, MD   10 mg at 05/16/13 2031  . terazosin (HYTRIN) capsule 10 mg  10 mg Oral QHS Runell Gess, MD   10 mg at 05/16/13 2031    Physical Exam: General appearance: alert and no distress Neck: no adenopathy, no carotid bruit, no JVD, supple, symmetrical, trachea midline and thyroid not enlarged, symmetric, no tenderness/mass/nodules Lungs: clear to auscultation bilaterally Heart: regular rate and rhythm, S1, S2 normal, no murmur, click, rub or gallop Abdomen: soft, non-tender; bowel sounds normal; no masses,  no organomegaly Extremities: extremities normal, atraumatic, no cyanosis or edema and groin without eccyhmosis, bruit or hematoma Pulses: 2+ and symmetric Skin: Skin color, texture, turgor normal. No rashes or lesions Neurologic: Grossly normal  Lab Results: Results for orders placed during  the hospital encounter of 05/16/13 (from the past 48 hour(s))  POCT ACTIVATED CLOTTING TIME     Status: None   Collection Time    05/16/13  3:50 PM      Result Value Range   Activated Clotting Time 381    CBC     Status: Abnormal   Collection Time    05/17/13  5:10 AM      Result Value Range   WBC 7.8  4.0 - 10.5 K/uL   RBC 4.12 (*) 4.22 - 5.81 MIL/uL   Hemoglobin 13.2  13.0 - 17.0 g/dL   HCT 16.1  09.6 - 04.5 %   MCV 94.7  78.0 - 100.0 fL   MCH 32.0  26.0 - 34.0 pg   MCHC 33.8  30.0 - 36.0 g/dL   RDW 40.9  81.1 - 91.4 %   Platelets 153  150 - 400 K/uL  BASIC METABOLIC PANEL     Status: None   Collection Time    05/17/13  5:10 AM      Result Value Range   Sodium 138  135 - 145 mEq/L   Potassium 3.8  3.5 - 5.1 mEq/L   Chloride 107  96 - 112 mEq/L   CO2 24  19 - 32 mEq/L   Glucose, Bld 88  70 - 99 mg/dL     BUN 13  6 - 23 mg/dL   Creatinine, Ser 7.82  0.50 - 1.35 mg/dL   Calcium 8.5  8.4 - 95.6 mg/dL   GFR calc non Af Amer >90  >90 mL/min   GFR calc Af Amer >90  >90 mL/min   Comment:            The eGFR has been calculated     using the CKD EPI equation.     This calculation has not been     validated in all clinical     situations.     eGFR's persistently     <90 mL/min signify     possible Chronic Kidney Disease.    Imaging: Dg Chest 2 View  05/15/2013   *RADIOLOGY REPORT*  Clinical Data: Chest pain, preop for cardiac catheterization  CHEST - 2 VIEW  Comparison: Portable chest x-ray of 10/30/2012  Findings: No active infiltrate or effusion is seen.  Mild cardiomegaly is stable.  Median sternotomy sutures are noted. There are degenerative changes throughout the thoracic spine and within the shoulders.  IMPRESSION: Stable cardiomegaly.  No active lung disease.   Original Report Authenticated By: Dwyane Dee, M.D.    Assessment:  1. Principal Problem: 2.   Abnormal nuclear stress test 3. Active Problems: 4.   S/P CABG x 3 11/2011: LIMA TO LAD, SVG TO OM, SVG TO RCA., VG to OM occluded, jeopardized LCX, by cath 08/29/12 -- Re-Do CABG with SVG-OM (09/2012) 5.   ICM, EF 40% Jan 2013,  50-55% Oct 2013,  40-45% with apical infarct 10/18/12 6.   Angina effort 7.   S/P angioplasty with stent,  10/31/12 VG to OM that was new graft from 11/13 in setting of NSTEMI 8.   Plan:  1. Successful PCI to OM SVG yesterday at the ostium and close to the insertion. He feels well today. Ambulate with cardiac rehab. Anticipate d/c home today. Follow-up with Dr. Allyson Sabal. He will determine when it is okay for [name] to resume cardiac rehab.  Time Spent Directly with Patient:  15 minutes  Length of Stay:  LOS: 1 day  Chrystie Nose, MD, [facility] Attending Cardiologist [facility]  Reniya Mcclees C 05/17/2013, 7:52 AM

## 2013-05-19 ENCOUNTER — Encounter (HOSPITAL_COMMUNITY): Payer: Self-pay

## 2013-05-19 MED FILL — Sodium Chloride IV Soln 0.9%: INTRAVENOUS | Qty: 50 | Status: AC

## 2013-05-20 ENCOUNTER — Encounter (HOSPITAL_COMMUNITY): Payer: Self-pay

## 2013-05-22 ENCOUNTER — Encounter (HOSPITAL_COMMUNITY): Payer: Self-pay

## 2013-05-26 ENCOUNTER — Encounter (HOSPITAL_COMMUNITY): Payer: Self-pay

## 2013-05-27 ENCOUNTER — Encounter (HOSPITAL_COMMUNITY): Payer: Self-pay

## 2013-05-29 ENCOUNTER — Encounter: Payer: Self-pay | Admitting: Cardiovascular Disease

## 2013-05-29 ENCOUNTER — Ambulatory Visit: Payer: Medicare Other | Admitting: Cardiovascular Disease

## 2013-05-29 ENCOUNTER — Encounter (HOSPITAL_COMMUNITY): Payer: Self-pay

## 2013-05-29 ENCOUNTER — Ambulatory Visit (INDEPENDENT_AMBULATORY_CARE_PROVIDER_SITE_OTHER): Payer: Medicare Other | Admitting: Cardiovascular Disease

## 2013-05-29 VITALS — BP 122/68 | HR 66 | Ht 70.5 in | Wt 190.0 lb

## 2013-05-29 DIAGNOSIS — Z9889 Other specified postprocedural states: Secondary | ICD-10-CM

## 2013-05-29 DIAGNOSIS — Z95820 Peripheral vascular angioplasty status with implants and grafts: Secondary | ICD-10-CM

## 2013-05-29 NOTE — Progress Notes (Signed)
05/29/2013 [name]   November 10, 1945  [id number]  Primary Physician Provider Not In System Primary Cardiologist: [name] [name]   HPI:  The patient is a very pleasant 67 year old mildly overweight married Caucasian male, father of 3 and grandfather to 7 grandchildren, whom I last saw in the office in July. He has a history of CAD, status post coronary artery bypass grafting by Dr. [name] in January of last year after a catheterization performed by me December 02, 2011, revealed left main 3-vessel disease with moderate LV dysfunction. He had a LIMA to his LAD, a vein to an OM branch and to the RCA. His postoperative course was uncomplicated. He did stop smoking at that time. His other problems include hypertension and hyperlipidemia as well as a family history of heart disease. He had recurrent episodes of unstable angina and ultimately underwent recatheterization by Dr. [name] in October of last year revealing occluded vein to the circumflex marginal branch with a total left main. He had a patent LIMA to his LAD and a patent vein to the RCA. It was presumed that his angina and non-STEMIs were related to ischemia in the circumflex territory. He ultimately underwent redo bypass surgery x1 to the circumflex obtuse marginal branch in November and returned several times after that with recurrent angina. Catheterization revealed a kink in the vessel just proximal to the distal anastomosis as well as preanastomotic lesion. I ultimately performed stenting of his newly-placed circumflex marginal vein graft on October 31, 2012, which has resulted in complete resolution of his anginal symptoms. He participated in cardiac rehab. Recently he developed recurrent chest pain. A Myoview stress test performed on 05/13/13 was read as "high risk" is remarkable for significant anterolateral and inferolateral ischemia.  I performed cardiac catheterization on him 05/16/13 revealing  high-grade in-stent restenosis" within the circumflex obtuse marginal branch SVG distal stented segment as well as at the aorto/ostium. I resected the distal portion of the vein graft and stent at the origin as well as drug-eluting stents. He is currently asymptomatic.      Current Outpatient Prescriptions  Medication Sig Dispense Refill  . amLODipine (NORVASC) 5 MG tablet Take 1 tablet (5 mg total) by mouth daily.  30 tablet  5  . aspirin EC 325 MG EC tablet Take 1 tablet (325 mg total) by mouth daily.  30 tablet    . atorvastatin (LIPITOR) 10 MG tablet Take 20 mg by mouth daily.       . carvedilol (COREG) 3.125 MG tablet Take 1 tablet (3.125 mg total) by mouth 2 (two) times daily with a meal.  60 tablet  5  . ferrous sulfate 325 (65 FE) MG tablet Take 1 tablet (325 mg total) by mouth 3 (three) times daily with meals.  30 tablet  5  . finasteride (PROSCAR) 5 MG tablet Take 5 mg by mouth daily.      Marland Kitchen HYDROcodone-acetaminophen (NORCO) 10-325 MG per tablet Take 1 tablet by mouth every 6 (six) hours as needed for pain.  30 tablet  0  . isosorbide mononitrate (IMDUR) 30 MG 24 hr tablet Take 1 tablet (30 mg total) by mouth daily.  90 tablet  3  . nitroGLYCERIN (NITROSTAT) 0.4 MG SL tablet Place 1 tablet (0.4 mg total) under the tongue every 5 (five) minutes x 3 doses as needed for chest pain.  25 tablet  5  . omeprazole (PRILOSEC) 20 MG capsule Take 20 mg by mouth 2 (  two) times daily.      . prasugrel (EFFIENT) 10 MG TABS Take 1 tablet (10 mg total) by mouth daily.  90 tablet  3  . terazosin (HYTRIN) 10 MG capsule Take 10 mg by mouth at bedtime.       No current facility-administered medications for this visit.    No Known Allergies  History   Social History  . Marital Status: Married    Spouse Name: N/A    Number of Children: N/A  . Years of Education: N/A   Occupational History  . Not on file.   Social History Main Topics  . Smoking status: Former Smoker -- 0.50 packs/day for 50  years    Types: Cigarettes    Quit date: 12/02/2011  . Smokeless tobacco: Never Used  . Alcohol Use: No     Comment: 08/29/2012 "haven't had a drink in > 25 years; never had problem w/it"  . Drug Use: No  . Sexually Active: Yes   Other Topics Concern  . Not on file   Social History Narrative   [name] [phone]. History of Agent Orange exposure, followed at [place]     Review of Systems: General: negative for chills, fever, night sweats or weight changes.  Cardiovascular: negative for chest pain, dyspnea on exertion, edema, orthopnea, palpitations, paroxysmal nocturnal dyspnea or shortness of breath Dermatological: negative for rash Respiratory: negative for cough or wheezing Urologic: negative for hematuria Abdominal: negative for nausea, vomiting, diarrhea, bright red blood per rectum, melena, or hematemesis Neurologic: negative for visual changes, syncope, or dizziness All other systems reviewed and are otherwise negative except as noted above.    Blood pressure 122/68, pulse 66, height 5' 10.5" (1.791 m), weight 190 lb (86.183 kg).  General appearance: alert and no distress Neck: no adenopathy, no carotid bruit, no JVD, supple, symmetrical, trachea midline and thyroid not enlarged, symmetric, no tenderness/mass/nodules Lungs: clear to auscultation bilaterally Heart: regular rate and rhythm, S1, S2 normal, no murmur, click, rub or gallop Extremities: extremities normal, atraumatic, no cyanosis or edema  EKG not performed today  ASSESSMENT AND PLAN:   S/P angioplasty with stent,  10/31/12 VG to OM that was new graft from 11/13 in setting of NSTEMI, NOW 05/16/13 with New DES stent to OM VG of ostium and distal portion Status post coronary bypass grafting in January 2013 after cath performed 12/02/11 revealed left main/3 vessel disease with moderate LV dysfunction. He hasn't LIMA to his LAD, a vein to an obtuse marginal branch and to the RCA. His postop course was uncomplicated. I  did perform stenting of his circumflex obtuse marginal branch vein graft in the setting of non-ST segment elevation myocardial infarction. He had recurrent chest pain and had a Myoview stress test performed 05/13/13 it was read as "high risk" with anterolateral and inferolateral ischemia. I performed cardiac catheterization on him 05/16/13 revealing "in-stent restenosis" within the distal circumflex obtuse marginal branch SVG stent this segment as well as at the origin of the vein graft. I stented both with drug-eluting stents. His symptoms have since resolved.      [name] FACP,FACC,FAHA, [facility] 05/29/2013 10:52 AM

## 2013-05-29 NOTE — Assessment & Plan Note (Addendum)
Status post coronary bypass grafting in January 2013 after cath performed 12/02/11 revealed left main/3 vessel disease with moderate LV dysfunction. He hasn't LIMA to his LAD, a vein to an obtuse marginal branch and to the RCA. His postop course was uncomplicated. I did perform stenting of his circumflex obtuse marginal branch vein graft in the setting of non-ST segment elevation myocardial infarction. He had recurrent chest pain and had a Myoview stress test performed 05/13/13 it was read as "high risk" with anterolateral and inferolateral ischemia. I performed cardiac catheterization on him 05/16/13 revealing "in-stent restenosis" within the distal circumflex obtuse marginal branch SVG stent this segment as well as at the origin of the vein graft. I stented both with drug-eluting stents. His symptoms have since resolved.

## 2013-05-29 NOTE — Patient Instructions (Addendum)
Your physician wants you to follow-up in:  6 months. You will receive a reminder letter in the mail two months in advance. If you don't receive a letter, please call our office to schedule the follow-up appointment.   

## 2013-06-02 ENCOUNTER — Encounter (HOSPITAL_COMMUNITY)
Admission: RE | Admit: 2013-06-02 | Discharge: 2013-06-02 | Disposition: A | Discharge status: Home / Self Care | Payer: Self-pay | Source: Ambulatory Visit | Attending: Cardiovascular Disease | Admitting: Cardiovascular Disease

## 2013-06-03 ENCOUNTER — Encounter (HOSPITAL_COMMUNITY)
Admission: RE | Admit: 2013-06-03 | Discharge: 2013-06-03 | Disposition: A | Discharge status: Home / Self Care | Payer: Self-pay | Source: Ambulatory Visit | Attending: Cardiovascular Disease | Admitting: Cardiovascular Disease

## 2013-06-05 ENCOUNTER — Encounter (HOSPITAL_COMMUNITY): Payer: Self-pay

## 2013-06-09 ENCOUNTER — Encounter (HOSPITAL_COMMUNITY)
Admission: RE | Admit: 2013-06-09 | Discharge: 2013-06-09 | Disposition: A | Discharge status: Home / Self Care | Payer: Self-pay | Source: Ambulatory Visit | Attending: Cardiovascular Disease | Admitting: Cardiovascular Disease

## 2013-06-09 DIAGNOSIS — I2510 Atherosclerotic heart disease of native coronary artery without angina pectoris: Secondary | ICD-10-CM | POA: Insufficient documentation

## 2013-06-09 DIAGNOSIS — I252 Old myocardial infarction: Secondary | ICD-10-CM | POA: Insufficient documentation

## 2013-06-09 DIAGNOSIS — Z5189 Encounter for other specified aftercare: Secondary | ICD-10-CM | POA: Insufficient documentation

## 2013-06-09 DIAGNOSIS — I10 Essential (primary) hypertension: Secondary | ICD-10-CM | POA: Insufficient documentation

## 2013-06-09 NOTE — Progress Notes (Signed)
[name] BP after cool-down was 82/52, HR 69, gatorade given. His recheck BP was 92/58, HR 71. Pt remained asymptomatic throughout.

## 2013-06-10 ENCOUNTER — Encounter (HOSPITAL_COMMUNITY)
Admission: RE | Admit: 2013-06-10 | Discharge: 2013-06-10 | Disposition: A | Discharge status: Home / Self Care | Payer: Self-pay | Source: Ambulatory Visit | Attending: Cardiovascular Disease | Admitting: Cardiovascular Disease

## 2013-06-12 ENCOUNTER — Encounter (HOSPITAL_COMMUNITY)
Admission: RE | Admit: 2013-06-12 | Discharge: 2013-06-12 | Disposition: A | Discharge status: Home / Self Care | Payer: Self-pay | Source: Ambulatory Visit | Attending: Cardiovascular Disease | Admitting: Cardiovascular Disease

## 2013-06-16 ENCOUNTER — Encounter (HOSPITAL_COMMUNITY)
Admission: RE | Admit: 2013-06-16 | Discharge: 2013-06-16 | Disposition: A | Discharge status: Home / Self Care | Payer: Self-pay | Source: Ambulatory Visit | Attending: Cardiovascular Disease | Admitting: Cardiovascular Disease

## 2013-06-17 ENCOUNTER — Encounter (HOSPITAL_COMMUNITY)
Admission: RE | Admit: 2013-06-17 | Discharge: 2013-06-17 | Disposition: A | Discharge status: Home / Self Care | Payer: Self-pay | Source: Ambulatory Visit | Attending: Cardiovascular Disease | Admitting: Cardiovascular Disease

## 2013-06-19 ENCOUNTER — Encounter (HOSPITAL_COMMUNITY): Payer: Self-pay

## 2013-06-23 ENCOUNTER — Encounter (HOSPITAL_COMMUNITY)
Admission: RE | Admit: 2013-06-23 | Discharge: 2013-06-23 | Disposition: A | Discharge status: Home / Self Care | Payer: Self-pay | Source: Ambulatory Visit | Attending: Cardiovascular Disease | Admitting: Cardiovascular Disease

## 2013-06-24 ENCOUNTER — Encounter (HOSPITAL_COMMUNITY)
Admission: RE | Admit: 2013-06-24 | Discharge: 2013-06-24 | Disposition: A | Discharge status: Home / Self Care | Payer: Self-pay | Source: Ambulatory Visit | Attending: Cardiovascular Disease | Admitting: Cardiovascular Disease

## 2013-06-26 ENCOUNTER — Encounter (HOSPITAL_COMMUNITY)
Admission: RE | Admit: 2013-06-26 | Discharge: 2013-06-26 | Disposition: A | Discharge status: Home / Self Care | Payer: Self-pay | Source: Ambulatory Visit | Attending: Cardiovascular Disease | Admitting: Cardiovascular Disease

## 2013-06-30 ENCOUNTER — Encounter (HOSPITAL_COMMUNITY)
Admission: RE | Admit: 2013-06-30 | Discharge: 2013-06-30 | Disposition: A | Discharge status: Home / Self Care | Payer: Self-pay | Source: Ambulatory Visit | Attending: Cardiovascular Disease | Admitting: Cardiovascular Disease

## 2013-07-01 ENCOUNTER — Encounter (HOSPITAL_COMMUNITY)
Admission: RE | Admit: 2013-07-01 | Discharge: 2013-07-01 | Disposition: A | Discharge status: Home / Self Care | Payer: Self-pay | Source: Ambulatory Visit | Attending: Cardiovascular Disease | Admitting: Cardiovascular Disease

## 2013-07-03 ENCOUNTER — Encounter (HOSPITAL_COMMUNITY): Payer: Self-pay

## 2013-07-08 ENCOUNTER — Encounter (HOSPITAL_COMMUNITY)
Admission: RE | Admit: 2013-07-08 | Discharge: 2013-07-08 | Disposition: A | Discharge status: Home / Self Care | Payer: Self-pay | Source: Ambulatory Visit | Attending: Cardiovascular Disease | Admitting: Cardiovascular Disease

## 2013-07-08 DIAGNOSIS — I252 Old myocardial infarction: Secondary | ICD-10-CM | POA: Insufficient documentation

## 2013-07-08 DIAGNOSIS — I2510 Atherosclerotic heart disease of native coronary artery without angina pectoris: Secondary | ICD-10-CM | POA: Insufficient documentation

## 2013-07-08 DIAGNOSIS — I10 Essential (primary) hypertension: Secondary | ICD-10-CM | POA: Insufficient documentation

## 2013-07-08 DIAGNOSIS — Z5189 Encounter for other specified aftercare: Secondary | ICD-10-CM | POA: Insufficient documentation

## 2013-07-10 ENCOUNTER — Encounter (HOSPITAL_COMMUNITY): Payer: Self-pay

## 2013-07-14 ENCOUNTER — Encounter (HOSPITAL_COMMUNITY)
Admission: RE | Admit: 2013-07-14 | Discharge: 2013-07-14 | Disposition: A | Discharge status: Home / Self Care | Payer: Self-pay | Source: Ambulatory Visit | Attending: Cardiovascular Disease | Admitting: Cardiovascular Disease

## 2013-07-15 ENCOUNTER — Encounter (HOSPITAL_COMMUNITY)
Admission: RE | Admit: 2013-07-15 | Discharge: 2013-07-15 | Disposition: A | Discharge status: Home / Self Care | Payer: Self-pay | Source: Ambulatory Visit | Attending: Cardiovascular Disease | Admitting: Cardiovascular Disease

## 2013-07-17 ENCOUNTER — Ambulatory Visit: Payer: Medicare Other | Admitting: Cardiovascular Disease

## 2013-07-17 ENCOUNTER — Encounter (HOSPITAL_COMMUNITY): Payer: Self-pay

## 2013-07-21 ENCOUNTER — Encounter (HOSPITAL_COMMUNITY): Payer: Self-pay

## 2013-07-22 ENCOUNTER — Encounter (HOSPITAL_COMMUNITY): Payer: Self-pay

## 2013-07-24 ENCOUNTER — Encounter (HOSPITAL_COMMUNITY): Payer: Self-pay

## 2013-07-28 ENCOUNTER — Encounter (HOSPITAL_COMMUNITY)
Admission: RE | Admit: 2013-07-28 | Discharge: 2013-07-28 | Disposition: A | Discharge status: Home / Self Care | Payer: Self-pay | Source: Ambulatory Visit | Attending: Cardiovascular Disease | Admitting: Cardiovascular Disease

## 2013-07-29 ENCOUNTER — Encounter (HOSPITAL_COMMUNITY)
Admission: RE | Admit: 2013-07-29 | Discharge: 2013-07-29 | Disposition: A | Discharge status: Home / Self Care | Payer: Self-pay | Source: Ambulatory Visit | Attending: Cardiovascular Disease | Admitting: Cardiovascular Disease

## 2013-07-30 ENCOUNTER — Encounter: Payer: Self-pay | Admitting: Cardiovascular Disease

## 2013-07-31 ENCOUNTER — Encounter (HOSPITAL_COMMUNITY)
Admission: RE | Admit: 2013-07-31 | Discharge: 2013-07-31 | Disposition: A | Discharge status: Home / Self Care | Payer: Self-pay | Source: Ambulatory Visit | Attending: Cardiovascular Disease | Admitting: Cardiovascular Disease

## 2013-08-04 ENCOUNTER — Encounter (HOSPITAL_COMMUNITY): Payer: Self-pay

## 2013-08-05 ENCOUNTER — Encounter (HOSPITAL_COMMUNITY)
Admission: RE | Admit: 2013-08-05 | Discharge: 2013-08-05 | Disposition: A | Discharge status: Home / Self Care | Payer: Self-pay | Source: Ambulatory Visit | Attending: Cardiovascular Disease | Admitting: Cardiovascular Disease

## 2013-08-06 ENCOUNTER — Ambulatory Visit (INDEPENDENT_AMBULATORY_CARE_PROVIDER_SITE_OTHER): Payer: Medicare Other | Admitting: Cardiovascular Disease

## 2013-08-06 ENCOUNTER — Encounter: Payer: Self-pay | Admitting: Cardiovascular Disease

## 2013-08-06 VITALS — BP 120/70 | HR 76 | Ht 70.0 in | Wt 196.4 lb

## 2013-08-06 DIAGNOSIS — E785 Hyperlipidemia, unspecified: Secondary | ICD-10-CM | POA: Diagnosis not present

## 2013-08-06 DIAGNOSIS — I10 Essential (primary) hypertension: Secondary | ICD-10-CM | POA: Diagnosis not present

## 2013-08-06 DIAGNOSIS — Z9889 Other specified postprocedural states: Secondary | ICD-10-CM

## 2013-08-06 DIAGNOSIS — Z95820 Peripheral vascular angioplasty status with implants and grafts: Secondary | ICD-10-CM

## 2013-08-06 NOTE — Assessment & Plan Note (Signed)
Status post coronary bypass grafting by Dr. [name] 12/02/11 after catheterization performed by me revealed left main/3 vessel disease with moderate LV dysfunction. He had a LIMA to his LAD, vein graft to an obtuse marginal branch and to the RCA. He has had recurrent vein graft stenosis the circumflex vein graft which are intervened on as recently for the second time as July of this year with drug-eluting stents. Since that time he has participated in cardiac rehabilitation 3 times a week and has had no episodes of chest pain or shortness of breath.

## 2013-08-06 NOTE — Patient Instructions (Addendum)
Your physician wants you to follow-up in: 6 months with an extender and 1 year with Dr Berry. You will receive a reminder letter in the mail two months in advance. If you don't receive a letter, please call our office to schedule the follow-up appointment.  

## 2013-08-06 NOTE — Progress Notes (Signed)
08/06/2013 [name]   Feb 19, 1946  [id number]  Primary Physician Provider Not In System Primary Cardiologist: [name] [name]   HPI:  The patient is a very pleasant 67 year old mildly overweight married Caucasian male, father of 3 and grandfather to 7 grandchildren, whom I last saw in the office in July. He has a history of CAD, status post coronary artery bypass grafting by Dr. [name] in January of last year after a catheterization performed by me December 02, 2011, revealed left main 3-vessel disease with moderate LV dysfunction. He had a LIMA to his LAD, a vein to an OM branch and to the RCA. His postoperative course was uncomplicated. He did stop smoking at that time. His other problems include hypertension and hyperlipidemia as well as a family history of heart disease. He had recurrent episodes of unstable angina and ultimately underwent recatheterization by Dr. [name] in October of last year revealing occluded vein to the circumflex marginal branch with a total left main. He had a patent LIMA to his LAD and a patent vein to the RCA. It was presumed that his angina and non-STEMIs were related to ischemia in the circumflex territory. He ultimately underwent redo bypass surgery x1 to the circumflex obtuse marginal branch in November and returned several times after that with recurrent angina. Catheterization revealed a kink in the vessel just proximal to the distal anastomosis as well as preanastomotic lesion. I ultimately performed stenting of his newly-placed circumflex marginal vein graft on October 31, 2012, which has resulted in complete resolution of his anginal symptoms. He participated in cardiac rehab. Recently he developed recurrent chest pain. A Myoview stress test performed on 05/13/13 was read as "high risk" is remarkable for significant anterolateral and inferolateral ischemia.  I performed cardiac catheterization on him 05/16/13 revealing  high-grade in-stent restenosis" within the circumflex obtuse marginal branch SVG distal stented segment as well as at the aorto/ostium. I we stented the distal portion of the vein graft and stent at the origin as well as drug-eluting stents.he has remained asymptomatic since I saw him in the office 05/29/13. He does participate in cardiac rehabilitation 3 times a week.    Current Outpatient Prescriptions  Medication Sig Dispense Refill  . amLODipine (NORVASC) 5 MG tablet Take 1 tablet (5 mg total) by mouth daily.  30 tablet  5  . aspirin EC 325 MG EC tablet Take 1 tablet (325 mg total) by mouth daily.  30 tablet    . atorvastatin (LIPITOR) 10 MG tablet Take 20 mg by mouth daily.       . carvedilol (COREG) 3.125 MG tablet Take 1 tablet (3.125 mg total) by mouth 2 (two) times daily with a meal.  60 tablet  5  . doxycycline (MONODOX) 100 MG capsule Take 100 mg by mouth 2 (two) times daily.      . ferrous sulfate 325 (65 FE) MG tablet Take 1 tablet (325 mg total) by mouth 3 (three) times daily with meals.  30 tablet  5  . finasteride (PROSCAR) 5 MG tablet Take 5 mg by mouth daily.      Marland Kitchen HYDROcodone-acetaminophen (NORCO) 10-325 MG per tablet Take 1 tablet by mouth every 6 (six) hours as needed for pain.  30 tablet  0  . isosorbide mononitrate (IMDUR) 30 MG 24 hr tablet Take 1 tablet (30 mg total) by mouth daily.  90 tablet  3  . nitroGLYCERIN (NITROSTAT) 0.4 MG SL tablet Place  1 tablet (0.4 mg total) under the tongue every 5 (five) minutes x 3 doses as needed for chest pain.  25 tablet  5  . omeprazole (PRILOSEC) 20 MG capsule Take 20 mg by mouth 2 (two) times daily.      . prasugrel (EFFIENT) 10 MG TABS Take 1 tablet (10 mg total) by mouth daily.  90 tablet  3  . terazosin (HYTRIN) 10 MG capsule Take 10 mg by mouth at bedtime.       No current facility-administered medications for this visit.    No Known Allergies  History   Social History  . Marital Status: Married    Spouse Name: N/A     Number of Children: N/A  . Years of Education: N/A   Occupational History  . Not on file.   Social History Main Topics  . Smoking status: Former Smoker -- 0.50 packs/day for 50 years    Types: Cigarettes    Quit date: 12/02/2011  . Smokeless tobacco: Never Used  . Alcohol Use: No     Comment: 08/29/2012 "haven't had a drink in > 25 years; never had problem w/it"  . Drug Use: No  . Sexual Activity: Yes   Other Topics Concern  . Not on file   Social History Narrative   [employer] vet [phone]. History of Agent Orange exposure, followed at [place]     Review of Systems: General: negative for chills, fever, night sweats or weight changes.  Cardiovascular: negative for chest pain, dyspnea on exertion, edema, orthopnea, palpitations, paroxysmal nocturnal dyspnea or shortness of breath Dermatological: negative for rash Respiratory: negative for cough or wheezing Urologic: negative for hematuria Abdominal: negative for nausea, vomiting, diarrhea, bright red blood per rectum, melena, or hematemesis Neurologic: negative for visual changes, syncope, or dizziness All other systems reviewed and are otherwise negative except as noted above.    Blood pressure 120/70, pulse 76, height 5\' 10"  (1.778 m), weight 196 lb 6.4 oz (89.086 kg).  General appearance: alert and no distress Neck: no adenopathy, no carotid bruit, no JVD, supple, symmetrical, trachea midline and thyroid not enlarged, symmetric, no tenderness/mass/nodules Lungs: clear to auscultation bilaterally Heart: regular rate and rhythm, S1, S2 normal, no murmur, click, rub or gallop Extremities: extremities normal, atraumatic, no cyanosis or edema  EKG not performed today  ASSESSMENT AND PLAN:   S/P angioplasty with stent,  10/31/12 VG to OM that was new graft from 11/13 in setting of NSTEMI, NOW 05/16/13 with New DES stent to OM VG of ostium and distal portion Status post coronary bypass grafting by Dr. [name] 12/02/11 after  catheterization performed by me revealed left main/3 vessel disease with moderate LV dysfunction. He had a LIMA to his LAD, vein graft to an obtuse marginal branch and to the RCA. He has had recurrent vein graft stenosis the circumflex vein graft which are intervened on as recently for the second time as July of this year with drug-eluting stents. Since that time he has participated in cardiac rehabilitation 3 times a week and has had no episodes of chest pain or shortness of breath.  Dyslipidemia On statin therapy with excellent lipid profile  HTN (hypertension) Well-controlled on current medications      [name] [facility], [facility] 08/06/2013 4:54 PM

## 2013-08-06 NOTE — Assessment & Plan Note (Signed)
Well-controlled on current medications 

## 2013-08-06 NOTE — Assessment & Plan Note (Signed)
On statin therapy with excellent lipid profile

## 2013-08-07 ENCOUNTER — Encounter (HOSPITAL_COMMUNITY)
Admission: RE | Admit: 2013-08-07 | Discharge: 2013-08-07 | Disposition: A | Discharge status: Home / Self Care | Payer: Self-pay | Source: Ambulatory Visit | Attending: Cardiovascular Disease | Admitting: Cardiovascular Disease

## 2013-08-07 DIAGNOSIS — I10 Essential (primary) hypertension: Secondary | ICD-10-CM | POA: Insufficient documentation

## 2013-08-07 DIAGNOSIS — Z5189 Encounter for other specified aftercare: Secondary | ICD-10-CM | POA: Insufficient documentation

## 2013-08-07 DIAGNOSIS — I2510 Atherosclerotic heart disease of native coronary artery without angina pectoris: Secondary | ICD-10-CM | POA: Insufficient documentation

## 2013-08-07 DIAGNOSIS — I252 Old myocardial infarction: Secondary | ICD-10-CM | POA: Insufficient documentation

## 2013-08-11 ENCOUNTER — Encounter (HOSPITAL_COMMUNITY)
Admission: RE | Admit: 2013-08-11 | Discharge: 2013-08-11 | Disposition: A | Discharge status: Home / Self Care | Payer: Self-pay | Source: Ambulatory Visit | Attending: Cardiovascular Disease | Admitting: Cardiovascular Disease

## 2013-08-12 ENCOUNTER — Encounter (HOSPITAL_COMMUNITY)
Admission: RE | Admit: 2013-08-12 | Discharge: 2013-08-12 | Disposition: A | Discharge status: Home / Self Care | Payer: Self-pay | Source: Ambulatory Visit | Attending: Cardiovascular Disease | Admitting: Cardiovascular Disease

## 2013-08-14 ENCOUNTER — Encounter (HOSPITAL_COMMUNITY)
Admission: RE | Admit: 2013-08-14 | Discharge: 2013-08-14 | Disposition: A | Discharge status: Home / Self Care | Payer: Self-pay | Source: Ambulatory Visit | Attending: Cardiovascular Disease | Admitting: Cardiovascular Disease

## 2013-08-18 ENCOUNTER — Encounter (HOSPITAL_COMMUNITY): Payer: Medicare Other

## 2013-08-19 ENCOUNTER — Encounter (HOSPITAL_COMMUNITY)
Admission: RE | Admit: 2013-08-19 | Discharge: 2013-08-19 | Disposition: A | Discharge status: Home / Self Care | Payer: Self-pay | Source: Ambulatory Visit | Attending: Cardiovascular Disease | Admitting: Cardiovascular Disease

## 2013-08-21 ENCOUNTER — Encounter (HOSPITAL_COMMUNITY): Payer: Medicare Other

## 2013-08-24 ENCOUNTER — Emergency Department (HOSPITAL_COMMUNITY)
Admission: EM | Admit: 2013-08-24 | Discharge: 2013-08-24 | Disposition: A | Discharge status: Home / Self Care | Payer: Medicare Other | Attending: Emergency Medicine | Admitting: Emergency Medicine

## 2013-08-24 ENCOUNTER — Emergency Department (HOSPITAL_COMMUNITY): Payer: Medicare Other

## 2013-08-24 ENCOUNTER — Encounter (HOSPITAL_COMMUNITY): Payer: Self-pay | Admitting: Emergency Medicine

## 2013-08-24 DIAGNOSIS — E7800 Pure hypercholesterolemia, unspecified: Secondary | ICD-10-CM | POA: Insufficient documentation

## 2013-08-24 DIAGNOSIS — R296 Repeated falls: Secondary | ICD-10-CM | POA: Insufficient documentation

## 2013-08-24 DIAGNOSIS — I209 Angina pectoris, unspecified: Secondary | ICD-10-CM | POA: Insufficient documentation

## 2013-08-24 DIAGNOSIS — L02419 Cutaneous abscess of limb, unspecified: Secondary | ICD-10-CM | POA: Diagnosis not present

## 2013-08-24 DIAGNOSIS — Z8659 Personal history of other mental and behavioral disorders: Secondary | ICD-10-CM | POA: Insufficient documentation

## 2013-08-24 DIAGNOSIS — Y939 Activity, unspecified: Secondary | ICD-10-CM | POA: Insufficient documentation

## 2013-08-24 DIAGNOSIS — Z23 Encounter for immunization: Secondary | ICD-10-CM | POA: Insufficient documentation

## 2013-08-24 DIAGNOSIS — I10 Essential (primary) hypertension: Secondary | ICD-10-CM | POA: Insufficient documentation

## 2013-08-24 DIAGNOSIS — M7989 Other specified soft tissue disorders: Secondary | ICD-10-CM | POA: Diagnosis not present

## 2013-08-24 DIAGNOSIS — Z7902 Long term (current) use of antithrombotics/antiplatelets: Secondary | ICD-10-CM | POA: Insufficient documentation

## 2013-08-24 DIAGNOSIS — Z79899 Other long term (current) drug therapy: Secondary | ICD-10-CM | POA: Insufficient documentation

## 2013-08-24 DIAGNOSIS — S8990XA Unspecified injury of unspecified lower leg, initial encounter: Secondary | ICD-10-CM | POA: Diagnosis not present

## 2013-08-24 DIAGNOSIS — Z87891 Personal history of nicotine dependence: Secondary | ICD-10-CM | POA: Insufficient documentation

## 2013-08-24 DIAGNOSIS — Z9889 Other specified postprocedural states: Secondary | ICD-10-CM | POA: Insufficient documentation

## 2013-08-24 DIAGNOSIS — I2510 Atherosclerotic heart disease of native coronary artery without angina pectoris: Secondary | ICD-10-CM | POA: Insufficient documentation

## 2013-08-24 DIAGNOSIS — Z951 Presence of aortocoronary bypass graft: Secondary | ICD-10-CM | POA: Insufficient documentation

## 2013-08-24 DIAGNOSIS — L0390 Cellulitis, unspecified: Secondary | ICD-10-CM

## 2013-08-24 DIAGNOSIS — Y929 Unspecified place or not applicable: Secondary | ICD-10-CM | POA: Insufficient documentation

## 2013-08-24 DIAGNOSIS — Z8619 Personal history of other infectious and parasitic diseases: Secondary | ICD-10-CM | POA: Insufficient documentation

## 2013-08-24 DIAGNOSIS — Z9861 Coronary angioplasty status: Secondary | ICD-10-CM | POA: Insufficient documentation

## 2013-08-24 DIAGNOSIS — M129 Arthropathy, unspecified: Secondary | ICD-10-CM | POA: Insufficient documentation

## 2013-08-24 DIAGNOSIS — Z7982 Long term (current) use of aspirin: Secondary | ICD-10-CM | POA: Insufficient documentation

## 2013-08-24 DIAGNOSIS — I252 Old myocardial infarction: Secondary | ICD-10-CM | POA: Insufficient documentation

## 2013-08-24 MED ORDER — TETANUS-DIPHTH-ACELL PERTUSSIS 5-2.5-18.5 LF-MCG/0.5 IM SUSP
0.5000 mL | Freq: Once | INTRAMUSCULAR | Status: AC
  Administered 2013-08-24: 0.5 mL via INTRAMUSCULAR
  Filled 2013-08-24: qty 0.5

## 2013-08-24 MED ORDER — CLINDAMYCIN HCL 300 MG PO CAPS: 300.0000 mg | ORAL_CAPSULE | Freq: Four times a day (QID) | ORAL | Status: DC

## 2013-08-24 NOTE — ED Notes (Addendum)
Pt states he is here for RLE swelling. Large knot noted to R inner shin/calf region. Knot is red and warm to touch. Skin color appropriate for age to ble, cap refils 3 sec to ble. Skin cool to touch. Pt also states his R ankle is swollen. Denies pain with Homan test. Pt states he might have gotten bitten by a spider but he cannot find a bite.

## 2013-08-24 NOTE — ED Provider Notes (Signed)
CSN: [id number]     Arrival date & time 08/24/13  1925 History   First MD Initiated Contact with Patient 08/24/13 1948     Chief Complaint  Patient presents with  . Fall  . Leg Swelling   (Consider location/radiation/quality/duration/timing/severity/associated sxs/prior Treatment) HPI Comments: Patient presents with swelling and discomfort to his right lower leg. He states it's been swollen since Wednesday he has an area on his anterior right lower leg that he says swelled up like a golf ball and it went down a little bit but still swollen and uncomfortable. He denies any pain in his posterior calf. He denies any pain in his groin or thigh. He's not sure if he got bitten by something to this area. He denies any other injuries. He noticed a little bit of redness developing earlier today. He denies he fevers or chills. He denies any chest pain or shortness of breath.  Patient is a 67 y.o. male presenting with fall.  Fall Pertinent negatives include no chest pain, no abdominal pain, no headaches and no shortness of breath.    Past Medical History  Diagnosis Date  . Hypertension   . Hypercholesterolemia   . Anginal pain   . DDD (degenerative disc disease), cervical   . Arthritis     "in my back and neck" (September 06, 2012)  . Malaria     "I've had it a couple times in the 1960's; almost died from it" (09-06-2012)  . PTSD (post-traumatic stress disorder)   . NSTEMI (non-ST elevated myocardial infarction) 11/2011    anterior  . NSTEMI (non-ST elevated myocardial infarction) 09-06-12  . Coronary artery disease   . CAD, CABG X 09 Nov 2011. re-do CABG X1 09/19/12 12/04/2011  . S/P angioplasty with stent,  10/31/12 VG to OM that was new graft from 11/13 in setting of NSTEMI 05/05/2013   Past Surgical History  Procedure Laterality Date  . Back surgery    . Lumbar disc surgery  1980's?  . Tonsillectomy      "as a kid" (09-06-2012)  . Cataract extraction w/ intraocular lens  implant, bilateral   1990's  . Coronary artery bypass graft  12/02/2011    Procedure: CORONARY ARTERY BYPASS GRAFTING (CABG);  Surgeon: Alleen Borne, MD;  Location: [facility] OR;  Service: Open Heart Surgery;  Laterality: N/A;  . Coronary artery bypass graft  09/19/2012    Procedure: REDO CORONARY ARTERY BYPASS GRAFTING (CABG);  Surgeon: Alleen Borne, MD;  Location: [facility] OR;  Service: Open Heart Surgery;  Laterality: N/A;  . Cardiac catheterization  11/2011  . Coronary angioplasty  10/2012    stent placed SVG to OM   . US echocardiography  02/29/2012  . Transthoracic echocardiogram  10/18/2012    LV EF 40%- 45%    Family History  Problem Relation Age of Onset  . Heart disease Mother   . Diabetes Mother   . Heart disease Father   . Diabetes Father   . Healthy Sister   . Diabetes Brother   . Diabetes Sister   . Diabetes Brother    History  Substance Use Topics  . Smoking status: Former Smoker -- 0.50 packs/day for 50 years    Types: Cigarettes    Quit date: 12/02/2011  . Smokeless tobacco: Never Used  . Alcohol Use: No     Comment: 09/06/12 "haven't had a drink in > 25 years; never had problem w/it"    Review of Systems  Constitutional: Negative for fever, chills, diaphoresis  and fatigue.  HENT: Negative for congestion, rhinorrhea and sneezing.   Eyes: Negative.   Respiratory: Negative for cough, chest tightness and shortness of breath.   Cardiovascular: Positive for leg swelling. Negative for chest pain.  Gastrointestinal: Negative for nausea, vomiting, abdominal pain, diarrhea and blood in stool.  Genitourinary: Negative for frequency, hematuria, flank pain and difficulty urinating.  Musculoskeletal: Positive for myalgias. Negative for arthralgias and back pain.  Skin: Negative for rash and wound.  Neurological: Negative for dizziness, speech difficulty, weakness, numbness and headaches.    Allergies  Review of patient's allergies indicates no known allergies.  Home Medications   Current  Outpatient Rx  Name  Route  Sig  Dispense  Refill  . amLODipine (NORVASC) 5 MG tablet   Oral   Take 1 tablet (5 mg total) by mouth daily.   30 tablet   5   . aspirin EC 325 MG EC tablet   Oral   Take 1 tablet (325 mg total) by mouth daily.   30 tablet      . atorvastatin (LIPITOR) 10 MG tablet   Oral   Take 20 mg by mouth daily.          . carvedilol (COREG) 3.125 MG tablet   Oral   Take 1 tablet (3.125 mg total) by mouth 2 (two) times daily with a meal.   60 tablet   5   . doxycycline (MONODOX) 100 MG capsule   Oral   Take 100 mg by mouth 2 (two) times daily.         . ferrous sulfate 325 (65 FE) MG tablet   Oral   Take 1 tablet (325 mg total) by mouth 3 (three) times daily with meals.   30 tablet   5   . finasteride (PROSCAR) 5 MG tablet   Oral   Take 5 mg by mouth daily.         Marland Kitchen HYDROcodone-acetaminophen (NORCO) 10-325 MG per tablet   Oral   Take 1 tablet by mouth every 6 (six) hours as needed for pain.   30 tablet   0   . isosorbide mononitrate (IMDUR) 30 MG 24 hr tablet   Oral   Take 1 tablet (30 mg total) by mouth daily.   90 tablet   3   . nitroGLYCERIN (NITROSTAT) 0.4 MG SL tablet   Sublingual   Place 1 tablet (0.4 mg total) under the tongue every 5 (five) minutes x 3 doses as needed for chest pain.   25 tablet   5   . omeprazole (PRILOSEC) 20 MG capsule   Oral   Take 20 mg by mouth 2 (two) times daily.         . prasugrel (EFFIENT) 10 MG TABS   Oral   Take 1 tablet (10 mg total) by mouth daily.   90 tablet   3   . terazosin (HYTRIN) 10 MG capsule   Oral   Take 10 mg by mouth at bedtime.         . clindamycin (CLEOCIN) 300 MG capsule   Oral   Take 1 capsule (300 mg total) by mouth 4 (four) times daily. X 7 days   28 capsule   0    BP 186/82  Pulse 65  Temp(Src) 98.2 F (36.8 C) (Oral)  Resp 16  Ht 5\' 10"  (1.778 m)  Wt 192 lb (87.091 kg)  BMI 27.55 kg/m2  SpO2 100% Physical Exam  Constitutional: He is oriented  to person, place, and time. He appears well-developed and well-nourished.  HENT:  Head: Normocephalic and atraumatic.  Eyes: Pupils are equal, round, and reactive to light.  Neck: Normal range of motion. Neck supple.  Cardiovascular: Normal rate, regular rhythm and normal heart sounds.   Pulmonary/Chest: Effort normal and breath sounds normal. No respiratory distress. He has no wheezes. He has no rales. He exhibits no tenderness.  Abdominal: Soft. Bowel sounds are normal. There is no tenderness. There is no rebound and no guarding.  Musculoskeletal: Normal range of motion. He exhibits edema.  Patient has a 3 cm area of mild swelling to the medial anterior right lower leg. There is a small amount of overlying erythema. This area is mildly tender on palpation. There is no underlying induration or fluctuance. There's also some mild swelling to the ankle. There is no bony tenderness. There is no posterior calf tenderness. He has normal pulses and sensation in the foot.  Lymphadenopathy:    He has no cervical adenopathy.  Neurological: He is alert and oriented to person, place, and time.  Skin: Skin is warm and dry. No rash noted.  Psychiatric: He has a normal mood and affect.    ED Course  Procedures (including critical care time) Labs Review Labs Reviewed - No data to display Imaging Review Dg Tibia/fibula Right  08/24/2013   CLINICAL DATA:  Fall, leg swelling.  EXAM: RIGHT TIBIA AND FIBULA - 2 VIEW  COMPARISON:  None.  FINDINGS: No acute bony abnormality. Specifically, no fracture, subluxation, or dislocation. Soft tissues are intact. Surgical clips in the medial soft tissues.  IMPRESSION: No acute bony abnormality.   Electronically Signed   By: Charlett Nose M.D.   On: 08/24/2013 21:20    EKG Interpretation   None       MDM   1. Cellulitis    Patient has a small amount swelling overlying the medial tibia. There is a small amount of erythema. The differential includes an early  cellulitis versus an insect bite. It's only mildly tender. He has no systemic signs of illness. There is no suggestions of DVT. I will go ahead and start him on some antibiotics and have him keep the leg elevated and followup with his primary care physician in 2 days. Advised to return here if his symptoms worsen.    Rolan Bucco, MD 08/24/13 2132

## 2013-08-24 NOTE — ED Notes (Signed)
Pt. reports right lower leg swelling onset last Wednesday , pt. also reported that he fell backward last Wednesday , no LOC /ambulatory , denies fever or chills. Respirations unlabored.

## 2013-08-25 ENCOUNTER — Encounter (HOSPITAL_COMMUNITY)
Admission: RE | Admit: 2013-08-25 | Discharge: 2013-08-25 | Disposition: A | Discharge status: Home / Self Care | Payer: Self-pay | Source: Ambulatory Visit | Attending: Cardiovascular Disease | Admitting: Cardiovascular Disease

## 2013-08-26 ENCOUNTER — Encounter (HOSPITAL_COMMUNITY)
Admission: RE | Admit: 2013-08-26 | Discharge: 2013-08-26 | Disposition: A | Discharge status: Home / Self Care | Payer: Self-pay | Source: Ambulatory Visit | Attending: Cardiovascular Disease | Admitting: Cardiovascular Disease

## 2013-08-28 ENCOUNTER — Encounter (HOSPITAL_COMMUNITY)
Admission: RE | Admit: 2013-08-28 | Discharge: 2013-08-28 | Disposition: A | Discharge status: Home / Self Care | Payer: Self-pay | Source: Ambulatory Visit | Attending: Cardiovascular Disease | Admitting: Cardiovascular Disease

## 2013-09-01 ENCOUNTER — Encounter (HOSPITAL_COMMUNITY): Payer: Medicare Other

## 2013-09-02 ENCOUNTER — Encounter (HOSPITAL_COMMUNITY)
Admission: RE | Admit: 2013-09-02 | Discharge: 2013-09-02 | Disposition: A | Discharge status: Home / Self Care | Payer: Self-pay | Source: Ambulatory Visit | Attending: Cardiovascular Disease | Admitting: Cardiovascular Disease

## 2013-09-04 ENCOUNTER — Encounter (HOSPITAL_COMMUNITY): Payer: Medicare Other

## 2013-09-08 ENCOUNTER — Encounter (HOSPITAL_COMMUNITY)
Admission: RE | Admit: 2013-09-08 | Discharge: 2013-09-08 | Disposition: A | Discharge status: Home / Self Care | Payer: Self-pay | Source: Ambulatory Visit | Attending: Cardiovascular Disease | Admitting: Cardiovascular Disease

## 2013-09-08 DIAGNOSIS — I10 Essential (primary) hypertension: Secondary | ICD-10-CM | POA: Insufficient documentation

## 2013-09-08 DIAGNOSIS — Z5189 Encounter for other specified aftercare: Secondary | ICD-10-CM | POA: Insufficient documentation

## 2013-09-08 DIAGNOSIS — I252 Old myocardial infarction: Secondary | ICD-10-CM | POA: Insufficient documentation

## 2013-09-08 DIAGNOSIS — I2510 Atherosclerotic heart disease of native coronary artery without angina pectoris: Secondary | ICD-10-CM | POA: Insufficient documentation

## 2013-09-09 ENCOUNTER — Encounter (HOSPITAL_COMMUNITY)
Admission: RE | Admit: 2013-09-09 | Discharge: 2013-09-09 | Disposition: A | Discharge status: Home / Self Care | Payer: Self-pay | Source: Ambulatory Visit | Attending: Cardiovascular Disease | Admitting: Cardiovascular Disease

## 2013-09-11 ENCOUNTER — Encounter (HOSPITAL_COMMUNITY): Payer: Medicare Other

## 2013-09-15 ENCOUNTER — Encounter (HOSPITAL_COMMUNITY)
Admission: RE | Admit: 2013-09-15 | Discharge: 2013-09-15 | Disposition: A | Discharge status: Home / Self Care | Payer: Self-pay | Source: Ambulatory Visit | Attending: Cardiovascular Disease | Admitting: Cardiovascular Disease

## 2013-09-16 ENCOUNTER — Encounter (HOSPITAL_COMMUNITY)
Admission: RE | Admit: 2013-09-16 | Discharge: 2013-09-16 | Disposition: A | Discharge status: Home / Self Care | Payer: Self-pay | Source: Ambulatory Visit | Attending: Cardiovascular Disease | Admitting: Cardiovascular Disease

## 2013-09-18 ENCOUNTER — Encounter (HOSPITAL_COMMUNITY): Payer: Medicare Other

## 2013-09-22 ENCOUNTER — Encounter (HOSPITAL_COMMUNITY): Payer: Medicare Other

## 2013-09-23 ENCOUNTER — Encounter (HOSPITAL_COMMUNITY)
Admission: RE | Admit: 2013-09-23 | Discharge: 2013-09-23 | Disposition: A | Discharge status: Home / Self Care | Payer: Self-pay | Source: Ambulatory Visit | Attending: Cardiovascular Disease | Admitting: Cardiovascular Disease

## 2013-09-25 ENCOUNTER — Encounter (HOSPITAL_COMMUNITY)
Admission: RE | Admit: 2013-09-25 | Discharge: 2013-09-25 | Disposition: A | Discharge status: Home / Self Care | Payer: Self-pay | Source: Ambulatory Visit | Attending: Cardiovascular Disease | Admitting: Cardiovascular Disease

## 2013-09-29 ENCOUNTER — Encounter (HOSPITAL_COMMUNITY)
Admission: RE | Admit: 2013-09-29 | Discharge: 2013-09-29 | Disposition: A | Discharge status: Home / Self Care | Payer: Self-pay | Source: Ambulatory Visit | Attending: Cardiovascular Disease | Admitting: Cardiovascular Disease

## 2013-09-30 ENCOUNTER — Encounter (HOSPITAL_COMMUNITY)
Admission: RE | Admit: 2013-09-30 | Discharge: 2013-09-30 | Disposition: A | Discharge status: Home / Self Care | Payer: Self-pay | Source: Ambulatory Visit | Attending: Cardiovascular Disease | Admitting: Cardiovascular Disease

## 2013-10-06 ENCOUNTER — Encounter (HOSPITAL_COMMUNITY): Payer: Medicare Other

## 2013-10-06 DIAGNOSIS — Z5189 Encounter for other specified aftercare: Secondary | ICD-10-CM | POA: Insufficient documentation

## 2013-10-06 DIAGNOSIS — I2510 Atherosclerotic heart disease of native coronary artery without angina pectoris: Secondary | ICD-10-CM | POA: Insufficient documentation

## 2013-10-06 DIAGNOSIS — I252 Old myocardial infarction: Secondary | ICD-10-CM | POA: Insufficient documentation

## 2013-10-06 DIAGNOSIS — I10 Essential (primary) hypertension: Secondary | ICD-10-CM | POA: Insufficient documentation

## 2013-10-07 ENCOUNTER — Encounter (HOSPITAL_COMMUNITY)
Admission: RE | Admit: 2013-10-07 | Discharge: 2013-10-07 | Disposition: A | Discharge status: Home / Self Care | Payer: Self-pay | Source: Ambulatory Visit | Attending: Cardiovascular Disease | Admitting: Cardiovascular Disease

## 2013-10-09 ENCOUNTER — Encounter (HOSPITAL_COMMUNITY)
Admission: RE | Admit: 2013-10-09 | Discharge: 2013-10-09 | Disposition: A | Discharge status: Home / Self Care | Payer: Self-pay | Source: Ambulatory Visit | Attending: Cardiovascular Disease | Admitting: Cardiovascular Disease

## 2013-10-13 ENCOUNTER — Encounter (HOSPITAL_COMMUNITY): Payer: Medicare Other

## 2013-10-14 ENCOUNTER — Encounter (HOSPITAL_COMMUNITY)
Admission: RE | Admit: 2013-10-14 | Discharge: 2013-10-14 | Disposition: A | Discharge status: Home / Self Care | Payer: Self-pay | Source: Ambulatory Visit | Attending: Cardiovascular Disease | Admitting: Cardiovascular Disease

## 2013-10-16 ENCOUNTER — Encounter (HOSPITAL_COMMUNITY)
Admission: RE | Admit: 2013-10-16 | Discharge: 2013-10-16 | Disposition: A | Discharge status: Home / Self Care | Payer: Self-pay | Source: Ambulatory Visit | Attending: Cardiovascular Disease | Admitting: Cardiovascular Disease

## 2013-10-20 ENCOUNTER — Encounter (HOSPITAL_COMMUNITY)
Admission: RE | Admit: 2013-10-20 | Discharge: 2013-10-20 | Disposition: A | Discharge status: Home / Self Care | Payer: Self-pay | Source: Ambulatory Visit | Attending: Cardiovascular Disease | Admitting: Cardiovascular Disease

## 2013-10-21 ENCOUNTER — Encounter (HOSPITAL_COMMUNITY)
Admission: RE | Admit: 2013-10-21 | Discharge: 2013-10-21 | Disposition: A | Discharge status: Home / Self Care | Payer: Self-pay | Source: Ambulatory Visit | Attending: Cardiovascular Disease | Admitting: Cardiovascular Disease

## 2013-10-23 ENCOUNTER — Encounter (HOSPITAL_COMMUNITY)
Admission: RE | Admit: 2013-10-23 | Discharge: 2013-10-23 | Disposition: A | Discharge status: Home / Self Care | Payer: Self-pay | Source: Ambulatory Visit | Attending: Cardiovascular Disease | Admitting: Cardiovascular Disease

## 2013-10-25 ENCOUNTER — Other Ambulatory Visit (HOSPITAL_COMMUNITY): Payer: Self-pay | Admitting: Cardiology

## 2013-10-27 ENCOUNTER — Encounter (HOSPITAL_COMMUNITY)
Admission: RE | Admit: 2013-10-27 | Discharge: 2013-10-27 | Disposition: A | Discharge status: Home / Self Care | Payer: Self-pay | Source: Ambulatory Visit | Attending: Cardiovascular Disease | Admitting: Cardiovascular Disease

## 2013-10-27 ENCOUNTER — Encounter: Payer: Self-pay | Admitting: Internal Medicine

## 2013-10-28 ENCOUNTER — Encounter (HOSPITAL_COMMUNITY): Payer: Medicare Other

## 2013-11-03 ENCOUNTER — Encounter (HOSPITAL_COMMUNITY)
Admission: RE | Admit: 2013-11-03 | Discharge: 2013-11-03 | Disposition: A | Discharge status: Home / Self Care | Payer: Self-pay | Source: Ambulatory Visit | Attending: Cardiovascular Disease | Admitting: Cardiovascular Disease

## 2013-11-04 ENCOUNTER — Encounter (HOSPITAL_COMMUNITY)
Admission: RE | Admit: 2013-11-04 | Discharge: 2013-11-04 | Disposition: A | Discharge status: Home / Self Care | Payer: Self-pay | Source: Ambulatory Visit | Attending: Cardiovascular Disease | Admitting: Cardiovascular Disease

## 2013-11-10 ENCOUNTER — Encounter (HOSPITAL_COMMUNITY)
Admission: RE | Admit: 2013-11-10 | Discharge: 2013-11-10 | Disposition: A | Discharge status: Home / Self Care | Payer: Self-pay | Source: Ambulatory Visit | Attending: Cardiovascular Disease | Admitting: Cardiovascular Disease

## 2013-11-10 DIAGNOSIS — Z5189 Encounter for other specified aftercare: Secondary | ICD-10-CM | POA: Insufficient documentation

## 2013-11-10 DIAGNOSIS — I252 Old myocardial infarction: Secondary | ICD-10-CM | POA: Insufficient documentation

## 2013-11-10 DIAGNOSIS — I10 Essential (primary) hypertension: Secondary | ICD-10-CM | POA: Insufficient documentation

## 2013-11-10 DIAGNOSIS — I2510 Atherosclerotic heart disease of native coronary artery without angina pectoris: Secondary | ICD-10-CM | POA: Insufficient documentation

## 2013-11-11 ENCOUNTER — Encounter (HOSPITAL_COMMUNITY): Payer: Medicare Other

## 2013-11-13 ENCOUNTER — Encounter (HOSPITAL_COMMUNITY)
Admission: RE | Admit: 2013-11-13 | Discharge: 2013-11-13 | Disposition: A | Discharge status: Home / Self Care | Payer: Self-pay | Source: Ambulatory Visit | Attending: Cardiovascular Disease | Admitting: Cardiovascular Disease

## 2013-11-17 ENCOUNTER — Encounter (HOSPITAL_COMMUNITY): Payer: Medicare Other

## 2013-11-18 ENCOUNTER — Encounter (HOSPITAL_COMMUNITY): Payer: Medicare Other

## 2013-11-20 ENCOUNTER — Encounter (HOSPITAL_COMMUNITY): Payer: Medicare Other

## 2013-11-24 ENCOUNTER — Encounter (HOSPITAL_COMMUNITY)
Admission: RE | Admit: 2013-11-24 | Discharge: 2013-11-24 | Disposition: A | Discharge status: Home / Self Care | Payer: Self-pay | Source: Ambulatory Visit | Attending: Cardiovascular Disease | Admitting: Cardiovascular Disease

## 2013-11-25 ENCOUNTER — Encounter (HOSPITAL_COMMUNITY): Payer: Medicare Other

## 2013-11-27 ENCOUNTER — Encounter (HOSPITAL_COMMUNITY): Payer: Medicare Other

## 2013-12-01 ENCOUNTER — Encounter (HOSPITAL_COMMUNITY): Payer: Medicare Other

## 2013-12-02 ENCOUNTER — Encounter (HOSPITAL_COMMUNITY): Payer: Medicare Other

## 2013-12-04 ENCOUNTER — Encounter (HOSPITAL_COMMUNITY): Payer: Medicare Other

## 2013-12-08 ENCOUNTER — Encounter (HOSPITAL_COMMUNITY): Payer: Medicare Other

## 2013-12-08 DIAGNOSIS — I10 Essential (primary) hypertension: Secondary | ICD-10-CM | POA: Insufficient documentation

## 2013-12-08 DIAGNOSIS — Z5189 Encounter for other specified aftercare: Secondary | ICD-10-CM | POA: Insufficient documentation

## 2013-12-08 DIAGNOSIS — I2510 Atherosclerotic heart disease of native coronary artery without angina pectoris: Secondary | ICD-10-CM | POA: Insufficient documentation

## 2013-12-08 DIAGNOSIS — I252 Old myocardial infarction: Secondary | ICD-10-CM | POA: Insufficient documentation

## 2013-12-09 ENCOUNTER — Encounter (HOSPITAL_COMMUNITY)
Admission: RE | Admit: 2013-12-09 | Discharge: 2013-12-09 | Disposition: A | Discharge status: Home / Self Care | Payer: Self-pay | Source: Ambulatory Visit | Attending: Cardiovascular Disease | Admitting: Cardiovascular Disease

## 2013-12-11 ENCOUNTER — Encounter (HOSPITAL_COMMUNITY)
Admission: RE | Admit: 2013-12-11 | Discharge: 2013-12-11 | Disposition: A | Discharge status: Home / Self Care | Payer: Self-pay | Source: Ambulatory Visit | Attending: Cardiovascular Disease | Admitting: Cardiovascular Disease

## 2013-12-15 ENCOUNTER — Encounter (HOSPITAL_COMMUNITY)
Admission: RE | Admit: 2013-12-15 | Discharge: 2013-12-15 | Disposition: A | Discharge status: Home / Self Care | Payer: Self-pay | Source: Ambulatory Visit | Attending: Cardiovascular Disease | Admitting: Cardiovascular Disease

## 2013-12-16 ENCOUNTER — Encounter (HOSPITAL_COMMUNITY)
Admission: RE | Admit: 2013-12-16 | Discharge: 2013-12-16 | Disposition: A | Discharge status: Home / Self Care | Payer: Self-pay | Source: Ambulatory Visit | Attending: Cardiovascular Disease | Admitting: Cardiovascular Disease

## 2013-12-18 ENCOUNTER — Encounter (HOSPITAL_COMMUNITY)
Admission: RE | Admit: 2013-12-18 | Discharge: 2013-12-18 | Disposition: A | Discharge status: Home / Self Care | Payer: Self-pay | Source: Ambulatory Visit | Attending: Cardiovascular Disease | Admitting: Cardiovascular Disease

## 2013-12-22 ENCOUNTER — Encounter (HOSPITAL_COMMUNITY): Payer: Medicare Other

## 2013-12-23 ENCOUNTER — Encounter (HOSPITAL_COMMUNITY): Payer: Medicare Other

## 2013-12-25 ENCOUNTER — Encounter (HOSPITAL_COMMUNITY): Payer: Medicare Other

## 2013-12-29 ENCOUNTER — Encounter (HOSPITAL_COMMUNITY): Payer: Medicare Other

## 2013-12-30 ENCOUNTER — Encounter (HOSPITAL_COMMUNITY): Payer: Medicare Other

## 2014-01-01 ENCOUNTER — Encounter (HOSPITAL_COMMUNITY): Payer: Medicare Other

## 2014-01-05 ENCOUNTER — Encounter (HOSPITAL_COMMUNITY): Payer: Medicare Other

## 2014-01-05 DIAGNOSIS — I252 Old myocardial infarction: Secondary | ICD-10-CM | POA: Insufficient documentation

## 2014-01-05 DIAGNOSIS — I10 Essential (primary) hypertension: Secondary | ICD-10-CM | POA: Insufficient documentation

## 2014-01-05 DIAGNOSIS — I2510 Atherosclerotic heart disease of native coronary artery without angina pectoris: Secondary | ICD-10-CM | POA: Insufficient documentation

## 2014-01-05 DIAGNOSIS — Z5189 Encounter for other specified aftercare: Secondary | ICD-10-CM | POA: Insufficient documentation

## 2014-01-06 ENCOUNTER — Encounter (HOSPITAL_COMMUNITY): Payer: Medicare Other

## 2014-01-08 ENCOUNTER — Encounter (HOSPITAL_COMMUNITY): Payer: Medicare Other

## 2014-01-12 ENCOUNTER — Encounter (HOSPITAL_COMMUNITY): Payer: Medicare Other

## 2014-01-13 ENCOUNTER — Encounter (HOSPITAL_COMMUNITY)
Admission: RE | Admit: 2014-01-13 | Discharge: 2014-01-13 | Disposition: A | Discharge status: Home / Self Care | Payer: Self-pay | Source: Ambulatory Visit | Attending: Cardiovascular Disease | Admitting: Cardiovascular Disease

## 2014-01-15 ENCOUNTER — Encounter (HOSPITAL_COMMUNITY)
Admission: RE | Admit: 2014-01-15 | Discharge: 2014-01-15 | Disposition: A | Discharge status: Home / Self Care | Payer: Self-pay | Source: Ambulatory Visit | Attending: Cardiovascular Disease | Admitting: Cardiovascular Disease

## 2014-01-19 ENCOUNTER — Encounter (HOSPITAL_COMMUNITY): Payer: Medicare Other

## 2014-01-20 ENCOUNTER — Encounter (HOSPITAL_COMMUNITY)
Admission: RE | Admit: 2014-01-20 | Discharge: 2014-01-20 | Disposition: A | Discharge status: Home / Self Care | Payer: Self-pay | Source: Ambulatory Visit | Attending: Cardiovascular Disease | Admitting: Cardiovascular Disease

## 2014-01-22 ENCOUNTER — Encounter (HOSPITAL_COMMUNITY)
Admission: RE | Admit: 2014-01-22 | Discharge: 2014-01-22 | Disposition: A | Discharge status: Home / Self Care | Payer: Self-pay | Source: Ambulatory Visit | Attending: Cardiovascular Disease | Admitting: Cardiovascular Disease

## 2014-01-26 ENCOUNTER — Encounter (HOSPITAL_COMMUNITY)
Admission: RE | Admit: 2014-01-26 | Discharge: 2014-01-26 | Disposition: A | Discharge status: Home / Self Care | Payer: Self-pay | Source: Ambulatory Visit | Attending: Cardiovascular Disease | Admitting: Cardiovascular Disease

## 2014-01-27 ENCOUNTER — Encounter (HOSPITAL_COMMUNITY): Payer: Medicare Other

## 2014-01-29 ENCOUNTER — Encounter (HOSPITAL_COMMUNITY)
Admission: RE | Admit: 2014-01-29 | Discharge: 2014-01-29 | Disposition: A | Discharge status: Home / Self Care | Payer: Self-pay | Source: Ambulatory Visit | Attending: Cardiovascular Disease | Admitting: Cardiovascular Disease

## 2014-02-02 ENCOUNTER — Encounter (HOSPITAL_COMMUNITY): Payer: Medicare Other

## 2014-02-03 ENCOUNTER — Encounter (HOSPITAL_COMMUNITY)
Admission: RE | Admit: 2014-02-03 | Discharge: 2014-02-03 | Disposition: A | Discharge status: Home / Self Care | Payer: Self-pay | Source: Ambulatory Visit | Attending: Cardiovascular Disease | Admitting: Cardiovascular Disease

## 2014-02-05 ENCOUNTER — Encounter (HOSPITAL_COMMUNITY)
Admission: RE | Admit: 2014-02-05 | Discharge: 2014-02-05 | Disposition: A | Discharge status: Home / Self Care | Payer: Self-pay | Source: Ambulatory Visit | Attending: Cardiovascular Disease | Admitting: Cardiovascular Disease

## 2014-02-05 DIAGNOSIS — I2510 Atherosclerotic heart disease of native coronary artery without angina pectoris: Secondary | ICD-10-CM | POA: Insufficient documentation

## 2014-02-05 DIAGNOSIS — I10 Essential (primary) hypertension: Secondary | ICD-10-CM | POA: Insufficient documentation

## 2014-02-05 DIAGNOSIS — I252 Old myocardial infarction: Secondary | ICD-10-CM | POA: Insufficient documentation

## 2014-02-05 DIAGNOSIS — Z5189 Encounter for other specified aftercare: Secondary | ICD-10-CM | POA: Insufficient documentation

## 2014-02-09 ENCOUNTER — Encounter (HOSPITAL_COMMUNITY): Payer: Medicare Other

## 2014-02-10 ENCOUNTER — Encounter (HOSPITAL_COMMUNITY): Payer: Medicare Other

## 2014-02-12 ENCOUNTER — Encounter (HOSPITAL_COMMUNITY)
Admission: RE | Admit: 2014-02-12 | Discharge: 2014-02-12 | Disposition: A | Discharge status: Home / Self Care | Payer: Self-pay | Source: Ambulatory Visit | Attending: Cardiovascular Disease | Admitting: Cardiovascular Disease

## 2014-02-16 ENCOUNTER — Ambulatory Visit (INDEPENDENT_AMBULATORY_CARE_PROVIDER_SITE_OTHER): Payer: Medicare Other | Admitting: Physician Assistant

## 2014-02-16 ENCOUNTER — Encounter: Payer: Self-pay | Admitting: Physician Assistant

## 2014-02-16 ENCOUNTER — Encounter (HOSPITAL_COMMUNITY): Payer: Medicare Other

## 2014-02-16 VITALS — BP 158/80 | HR 56 | Ht 70.0 in | Wt 197.0 lb

## 2014-02-16 DIAGNOSIS — I255 Ischemic cardiomyopathy: Secondary | ICD-10-CM

## 2014-02-16 DIAGNOSIS — I2589 Other forms of chronic ischemic heart disease: Secondary | ICD-10-CM | POA: Diagnosis not present

## 2014-02-16 DIAGNOSIS — Z951 Presence of aortocoronary bypass graft: Secondary | ICD-10-CM

## 2014-02-16 DIAGNOSIS — Z95820 Peripheral vascular angioplasty status with implants and grafts: Secondary | ICD-10-CM

## 2014-02-16 DIAGNOSIS — I10 Essential (primary) hypertension: Secondary | ICD-10-CM

## 2014-02-16 DIAGNOSIS — I2510 Atherosclerotic heart disease of native coronary artery without angina pectoris: Secondary | ICD-10-CM

## 2014-02-16 DIAGNOSIS — Z9889 Other specified postprocedural states: Secondary | ICD-10-CM

## 2014-02-16 DIAGNOSIS — E785 Hyperlipidemia, unspecified: Secondary | ICD-10-CM | POA: Diagnosis not present

## 2014-02-16 NOTE — Assessment & Plan Note (Signed)
Is recent stent was through a vein graft to LAD last year. Patient has been out of Effient and for one month and was not resupplied by the [place]. Have given him samples for the next month and we will send some information to the [place] so they will continue supplying him.

## 2014-02-16 NOTE — Assessment & Plan Note (Signed)
Blood pressure is elevated this morning however the patient states it usually runs in the 120s. He is currently participating in cardiac rehabilitation and they're checking it on a regular basis. If he notices consistently over 130/80 in the next week he will call for blood pressure medication titration.

## 2014-02-16 NOTE — Assessment & Plan Note (Signed)
Currently on Lipitor recheck lipids in 6 months

## 2014-02-16 NOTE — Patient Instructions (Signed)
1.  Continue Effient. 2.  Follow up with Dr. Gwenlyn Found in six months.

## 2014-02-16 NOTE — Progress Notes (Signed)
Date:  02/16/2014   ID:  Darrell Clock., DOB October 01, 1946, MRN [id number]  PCP:  PROVIDER NOT IN SYSTEM  Primary Cardiologist:  Gwenlyn Found     History of Present Illness: [name]. is a 68 y.o. male The patient is a very pleasant 68 year old mildly overweight married Caucasian male, father of 36 and grandfather to 7 grandchildren, who sees Dr. Gwenlyn Found. He has a history of CAD, status post coronary artery bypass grafting by [name] in January of last year after a catheterization performed by me December 02, 2011, revealed left main 3-vessel disease with moderate LV dysfunction. He had a LIMA to his LAD, a vein to an OM branch and to the RCA. His postoperative course was uncomplicated. He did stop smoking at that time. His other problems include hypertension and hyperlipidemia as well as a family history of heart disease. He had recurrent episodes of unstable angina and ultimately underwent recatheterization by [name] in October of last year revealing occluded vein to the circumflex marginal branch with a total left main. He had a patent LIMA to his LAD and a patent vein to the RCA. It was presumed that his angina and non-STEMIs were related to ischemia in the circumflex territory. He ultimately underwent redo bypass surgery x1 to the circumflex obtuse marginal branch in November and returned several times after that with recurrent angina. Catheterization revealed a kink in the vessel just proximal to the distal anastomosis as well as preanastomotic lesion. Dr. Gwenlyn Found ultimately performed stenting of his newly-placed circumflex marginal vein graft on October 31, 2012, which has resulted in complete resolution of his anginal symptoms. He participated in cardiac rehab. Recently he developed recurrent chest pain. A Myoview stress test performed on 05/13/13 was read as "high risk" is remarkable for significant anterolateral and inferolateral ischemia.  I performed cardiac catheterization on him  05/16/13 revealing high-grade in-stent restenosis" within the circumflex obtuse marginal branch SVG distal stented segment as well as at the aorto/ostium. I we stented the distal portion of the vein graft and stent at the origin as well as drug-eluting stents.he has remained asymptomatic since I saw him in the office 05/29/13. He does participate in cardiac rehabilitation 3 times a week.  Presents today for a six-month evaluation. He states that the [place] stopped paying for effient and would not give him any more unless they had something in writing from [place].  He's is been out for one month.    The patient currently denies nausea, vomiting, fever, chest pain, shortness of breath, orthopnea, dizziness, PND, cough, congestion, abdominal pain, hematochezia, melena, lower extremity edema, claudication.  Wt Readings from Last 3 Encounters:  02/16/14 197 lb (89.359 kg)  08/24/13 192 lb (87.091 kg)  08/06/13 196 lb 6.4 oz (89.086 kg)     Past Medical History  Diagnosis Date  . Hypertension   . Hypercholesterolemia   . Anginal pain   . DDD (degenerative disc disease), cervical   . Arthritis     "in my back and neck" (09/06/2012)  . Malaria     "I've had it a couple times in the 1960's; almost died from it" (September 06, 2012)  . PTSD (post-traumatic stress disorder)   . NSTEMI (non-ST elevated myocardial infarction) 11/2011    anterior  . NSTEMI (non-ST elevated myocardial infarction) 09-06-12  . Coronary artery disease   . CAD, CABG X 09 Nov 2011. re-do CABG X1 09/19/12 12/04/2011  . S/P angioplasty with stent,  10/31/12  VG to OM that was new graft from 11/13 in setting of NSTEMI 05/05/2013    Current Outpatient Prescriptions  Medication Sig Dispense Refill  . amLODipine (NORVASC) 5 MG tablet Take 1 tablet (5 mg total) by mouth daily.  30 tablet  5  . aspirin EC 325 MG EC tablet Take 1 tablet (325 mg total) by mouth daily.  30 tablet    . atorvastatin (LIPITOR) 10 MG tablet Take 20 mg by mouth daily.        . carvedilol (COREG) 3.125 MG tablet TAKE ONE TABLET BY MOUTH TWICE DAILY WITH MEALS  60 tablet  3  . ferrous sulfate 325 (65 FE) MG tablet Take 1 tablet (325 mg total) by mouth 3 (three) times daily with meals.  30 tablet  5  . finasteride (PROSCAR) 5 MG tablet Take 5 mg by mouth daily.      Marland Kitchen HYDROcodone-acetaminophen (NORCO) 10-325 MG per tablet Take 1 tablet by mouth every 6 (six) hours as needed for pain.  30 tablet  0  . isosorbide mononitrate (IMDUR) 30 MG 24 hr tablet Take 1 tablet (30 mg total) by mouth daily.  90 tablet  3  . nitroGLYCERIN (NITROSTAT) 0.4 MG SL tablet Place 1 tablet (0.4 mg total) under the tongue every 5 (five) minutes x 3 doses as needed for chest pain.  25 tablet  5  . omeprazole (PRILOSEC) 20 MG capsule Take 20 mg by mouth 2 (two) times daily.      Marland Kitchen terazosin (HYTRIN) 10 MG capsule Take 10 mg by mouth at bedtime.      . prasugrel (EFFIENT) 10 MG TABS Take 1 tablet (10 mg total) by mouth daily.  90 tablet  3   No current facility-administered medications for this visit.    Allergies:   No Known Allergies  Social History:  The patient  reports that he quit smoking about 2 years ago. His smoking use included Cigarettes. He has a 25 pack-year smoking history. He has never used smokeless tobacco. He reports that he does not drink alcohol or use illicit drugs.   Family history:   Family History  Problem Relation Age of Onset  . Heart disease Mother   . Diabetes Mother   . Heart disease Father   . Diabetes Father   . Healthy Sister   . Diabetes Brother   . Diabetes Sister   . Diabetes Brother     ROS:  Please see the history of present illness.  All other systems reviewed and negative.   PHYSICAL EXAM: VS:  BP 158/80  Pulse 56  Ht 5\' 10"  (1.778 m)  Wt 197 lb (89.359 kg)  BMI 28.27 kg/m2 Well nourished, well developed, in no acute distress HEENT: Pupils are equal round react to light accommodation extraocular movements are intact.  Neck: no JVDNo  cervical lymphadenopathy. Cardiac: Regular rate and rhythm with 1/6 systolic murmur. Lungs:  clear to auscultation bilaterally, no wheezing, rhonchi or rales Ext: no lower extremity edema.  2+ radial and dorsalis pedis pulses. Skin: warm and dry Neuro:  Grossly normal  EKG:  Sinus bradycardia rate 56 per minute    ASSESSMENT AND PLAN:  Problem List Items Addressed This Visit   S/P CABG x 3 11/2011: LIMA TO LAD, SVG TO OM, SVG TO RCA., VG to OM occluded, jeopardized LCX, by cath 08/29/12 -- Re-Do CABG with SVG-OM (09/2012) (Chronic)   HTN (hypertension) (Chronic)     Blood pressure is elevated this morning however  the patient states it usually runs in the 120s. He is currently participating in cardiac rehabilitation and they're checking it on a regular basis. If he notices consistently over 130/80 in the next week he will call for blood pressure medication titration.    Dyslipidemia (Chronic)     Currently on Lipitor recheck lipids in 6 months    S/P angioplasty with stent,  10/31/12 VG to OM that was new graft from 11/13 in setting of NSTEMI, NOW 05/16/13 with New DES stent to OM VG of ostium and distal portion (Chronic)   ICM, EF 40% Jan 2013,  50-55% Oct 2013,  40-45% with apical infarct 10/18/12   Coronary artery disease     Is recent stent was through a vein graft to LAD last year. Patient has been out of Effient and for one month and was not resupplied by the [place]. Have given him samples for the next month and we will send some information to the [place] so they will continue supplying him.     Other Visit Diagnoses   Essential hypertension    -  Primary    Relevant Orders       EKG 12-Lead

## 2014-02-17 ENCOUNTER — Encounter (HOSPITAL_COMMUNITY)
Admission: RE | Admit: 2014-02-17 | Discharge: 2014-02-17 | Disposition: A | Discharge status: Home / Self Care | Payer: Self-pay | Source: Ambulatory Visit | Attending: Cardiovascular Disease | Admitting: Cardiovascular Disease

## 2014-02-19 ENCOUNTER — Encounter (HOSPITAL_COMMUNITY)
Admission: RE | Admit: 2014-02-19 | Discharge: 2014-02-19 | Disposition: A | Discharge status: Home / Self Care | Payer: Self-pay | Source: Ambulatory Visit | Attending: Cardiovascular Disease | Admitting: Cardiovascular Disease

## 2014-02-23 ENCOUNTER — Encounter (HOSPITAL_COMMUNITY): Payer: Medicare Other

## 2014-02-24 ENCOUNTER — Encounter (HOSPITAL_COMMUNITY)
Admission: RE | Admit: 2014-02-24 | Discharge: 2014-02-24 | Disposition: A | Discharge status: Home / Self Care | Payer: Self-pay | Source: Ambulatory Visit | Attending: Cardiovascular Disease | Admitting: Cardiovascular Disease

## 2014-02-26 ENCOUNTER — Encounter (HOSPITAL_COMMUNITY)
Admission: RE | Admit: 2014-02-26 | Discharge: 2014-02-26 | Disposition: A | Discharge status: Home / Self Care | Payer: Self-pay | Source: Ambulatory Visit | Attending: Cardiovascular Disease | Admitting: Cardiovascular Disease

## 2014-03-02 ENCOUNTER — Encounter (HOSPITAL_COMMUNITY): Payer: Medicare Other

## 2014-03-03 ENCOUNTER — Encounter (HOSPITAL_COMMUNITY)
Admission: RE | Admit: 2014-03-03 | Discharge: 2014-03-03 | Disposition: A | Discharge status: Home / Self Care | Payer: Self-pay | Source: Ambulatory Visit | Attending: Cardiovascular Disease | Admitting: Cardiovascular Disease

## 2014-03-03 ENCOUNTER — Telehealth: Payer: Self-pay | Admitting: Physician Assistant

## 2014-03-03 NOTE — Telephone Encounter (Signed)
Pt said he saw Bryan around 02-16-14. He said he was suppose to be faxing some info to the [place] so he can get his effient. He talked to the today and they still have not received it. Please fax to [phone]

## 2014-03-03 NOTE — Telephone Encounter (Signed)
Message forwarded to J.C. Wildman, LPN.  

## 2014-03-05 ENCOUNTER — Encounter (HOSPITAL_COMMUNITY)
Admission: RE | Admit: 2014-03-05 | Discharge: 2014-03-05 | Disposition: A | Discharge status: Home / Self Care | Payer: Self-pay | Source: Ambulatory Visit | Attending: Cardiovascular Disease | Admitting: Cardiovascular Disease

## 2014-03-09 ENCOUNTER — Emergency Department (HOSPITAL_COMMUNITY): Payer: Medicare Other

## 2014-03-09 ENCOUNTER — Encounter (HOSPITAL_COMMUNITY): Payer: Medicare Other

## 2014-03-09 ENCOUNTER — Emergency Department (HOSPITAL_COMMUNITY)
Admission: EM | Admit: 2014-03-09 | Discharge: 2014-03-09 | Disposition: A | Discharge status: Home / Self Care | Payer: Medicare Other | Attending: Emergency Medicine | Admitting: Emergency Medicine

## 2014-03-09 ENCOUNTER — Encounter (HOSPITAL_COMMUNITY): Payer: Self-pay | Admitting: Emergency Medicine

## 2014-03-09 DIAGNOSIS — Z951 Presence of aortocoronary bypass graft: Secondary | ICD-10-CM | POA: Insufficient documentation

## 2014-03-09 DIAGNOSIS — I252 Old myocardial infarction: Secondary | ICD-10-CM | POA: Diagnosis not present

## 2014-03-09 DIAGNOSIS — M129 Arthropathy, unspecified: Secondary | ICD-10-CM | POA: Insufficient documentation

## 2014-03-09 DIAGNOSIS — I10 Essential (primary) hypertension: Secondary | ICD-10-CM | POA: Diagnosis not present

## 2014-03-09 DIAGNOSIS — E7800 Pure hypercholesterolemia, unspecified: Secondary | ICD-10-CM | POA: Diagnosis not present

## 2014-03-09 DIAGNOSIS — I209 Angina pectoris, unspecified: Secondary | ICD-10-CM | POA: Diagnosis not present

## 2014-03-09 DIAGNOSIS — D3500 Benign neoplasm of unspecified adrenal gland: Secondary | ICD-10-CM | POA: Diagnosis not present

## 2014-03-09 DIAGNOSIS — Z9889 Other specified postprocedural states: Secondary | ICD-10-CM | POA: Insufficient documentation

## 2014-03-09 DIAGNOSIS — Z9861 Coronary angioplasty status: Secondary | ICD-10-CM | POA: Insufficient documentation

## 2014-03-09 DIAGNOSIS — Z87891 Personal history of nicotine dependence: Secondary | ICD-10-CM | POA: Insufficient documentation

## 2014-03-09 DIAGNOSIS — Z7982 Long term (current) use of aspirin: Secondary | ICD-10-CM | POA: Insufficient documentation

## 2014-03-09 DIAGNOSIS — Z8659 Personal history of other mental and behavioral disorders: Secondary | ICD-10-CM | POA: Insufficient documentation

## 2014-03-09 DIAGNOSIS — R143 Flatulence: Secondary | ICD-10-CM | POA: Diagnosis not present

## 2014-03-09 DIAGNOSIS — I2510 Atherosclerotic heart disease of native coronary artery without angina pectoris: Secondary | ICD-10-CM | POA: Insufficient documentation

## 2014-03-09 DIAGNOSIS — R141 Gas pain: Secondary | ICD-10-CM | POA: Insufficient documentation

## 2014-03-09 DIAGNOSIS — K5792 Diverticulitis of intestine, part unspecified, without perforation or abscess without bleeding: Secondary | ICD-10-CM

## 2014-03-09 DIAGNOSIS — Z8613 Personal history of malaria: Secondary | ICD-10-CM | POA: Insufficient documentation

## 2014-03-09 DIAGNOSIS — K5732 Diverticulitis of large intestine without perforation or abscess without bleeding: Secondary | ICD-10-CM | POA: Insufficient documentation

## 2014-03-09 DIAGNOSIS — N200 Calculus of kidney: Secondary | ICD-10-CM

## 2014-03-09 DIAGNOSIS — R142 Eructation: Secondary | ICD-10-CM

## 2014-03-09 DIAGNOSIS — Z79899 Other long term (current) drug therapy: Secondary | ICD-10-CM | POA: Insufficient documentation

## 2014-03-09 DIAGNOSIS — Z5189 Encounter for other specified aftercare: Secondary | ICD-10-CM | POA: Insufficient documentation

## 2014-03-09 LAB — CBC WITH DIFFERENTIAL/PLATELET
BASOS PCT: 0 % (ref 0–1)
Basophils Absolute: 0 10*3/uL (ref 0.0–0.1)
EOS ABS: 0.2 10*3/uL (ref 0.0–0.7)
Eosinophils Relative: 1 % (ref 0–5)
HCT: 39.9 % (ref 39.0–52.0)
HEMOGLOBIN: 14.1 g/dL (ref 13.0–17.0)
Lymphocytes Relative: 15 % (ref 12–46)
Lymphs Abs: 1.9 10*3/uL (ref 0.7–4.0)
MCH: 33.5 pg (ref 26.0–34.0)
MCHC: 35.3 g/dL (ref 30.0–36.0)
MCV: 94.8 fL (ref 78.0–100.0)
MONOS PCT: 7 % (ref 3–12)
Monocytes Absolute: 0.9 10*3/uL (ref 0.1–1.0)
NEUTROS PCT: 77 % (ref 43–77)
Neutro Abs: 9.4 10*3/uL — ABNORMAL HIGH (ref 1.7–7.7)
Platelets: 152 10*3/uL (ref 150–400)
RBC: 4.21 MIL/uL — ABNORMAL LOW (ref 4.22–5.81)
RDW: 14 % (ref 11.5–15.5)
WBC: 12.4 10*3/uL — ABNORMAL HIGH (ref 4.0–10.5)

## 2014-03-09 LAB — URINALYSIS, ROUTINE W REFLEX MICROSCOPIC
Bilirubin Urine: NEGATIVE
GLUCOSE, UA: 250 mg/dL — AB
Ketones, ur: NEGATIVE mg/dL
Nitrite: NEGATIVE
PH: 6 (ref 5.0–8.0)
Protein, ur: 30 mg/dL — AB
SPECIFIC GRAVITY, URINE: 1.037 — AB (ref 1.005–1.030)
Urobilinogen, UA: 0.2 mg/dL (ref 0.0–1.0)

## 2014-03-09 LAB — COMPREHENSIVE METABOLIC PANEL WITH GFR
ALT: 21 U/L (ref 0–53)
AST: 28 U/L (ref 0–37)
Albumin: 3.5 g/dL (ref 3.5–5.2)
Alkaline Phosphatase: 81 U/L (ref 39–117)
BUN: 14 mg/dL (ref 6–23)
CO2: 23 meq/L (ref 19–32)
Calcium: 9.3 mg/dL (ref 8.4–10.5)
Chloride: 102 meq/L (ref 96–112)
Creatinine, Ser: 0.76 mg/dL (ref 0.50–1.35)
GFR calc Af Amer: >90 mL/min
GFR calc non Af Amer: >90 mL/min
Glucose, Bld: 100 mg/dL — ABNORMAL HIGH (ref 70–99)
Potassium: 3.5 meq/L — ABNORMAL LOW (ref 3.7–5.3)
Sodium: 140 meq/L (ref 137–147)
Total Bilirubin: 0.4 mg/dL (ref 0.3–1.2)
Total Protein: 7.3 g/dL (ref 6.0–8.3)

## 2014-03-09 LAB — URINE MICROSCOPIC-ADD ON

## 2014-03-09 MED ORDER — TAMSULOSIN HCL 0.4 MG PO CAPS: 0.4000 mg | ORAL_CAPSULE | Freq: Every day | ORAL | Status: DC

## 2014-03-09 MED ORDER — CIPROFLOXACIN HCL 500 MG PO TABS: 500.0000 mg | ORAL_TABLET | Freq: Two times a day (BID) | ORAL | Status: DC

## 2014-03-09 MED ORDER — IOHEXOL 300 MG/ML  SOLN
20.0000 mL | INTRAMUSCULAR | Status: AC
  Administered 2014-03-09: 20 mL via ORAL

## 2014-03-09 MED ORDER — ONDANSETRON 4 MG PO TBDP: 4.0000 mg | ORAL_TABLET | Freq: Three times a day (TID) | ORAL | Status: DC | PRN

## 2014-03-09 MED ORDER — OXYCODONE-ACETAMINOPHEN 10-325 MG PO TABS: 1.0000 | ORAL_TABLET | Freq: Four times a day (QID) | ORAL | Status: DC | PRN

## 2014-03-09 MED ORDER — KETOROLAC TROMETHAMINE 30 MG/ML IJ SOLN
30.0000 mg | Freq: Once | INTRAMUSCULAR | Status: AC
  Administered 2014-03-09: 30 mg via INTRAVENOUS
  Filled 2014-03-09: qty 1

## 2014-03-09 MED ORDER — SODIUM CHLORIDE 0.9 % IV SOLN
INTRAVENOUS | Status: DC
  Administered 2014-03-09: 17:00:00 via INTRAVENOUS

## 2014-03-09 MED ORDER — METRONIDAZOLE IN NACL 5-0.79 MG/ML-% IV SOLN
500.0000 mg | Freq: Once | INTRAVENOUS | Status: AC
  Administered 2014-03-09: 500 mg via INTRAVENOUS
  Filled 2014-03-09: qty 100

## 2014-03-09 MED ORDER — FENTANYL CITRATE 0.05 MG/ML IJ SOLN
100.0000 ug | Freq: Once | INTRAMUSCULAR | Status: AC
  Administered 2014-03-09: 100 ug via INTRAVENOUS
  Filled 2014-03-09: qty 2

## 2014-03-09 MED ORDER — IOHEXOL 300 MG/ML  SOLN
100.0000 mL | Freq: Once | INTRAMUSCULAR | Status: AC | PRN
  Administered 2014-03-09: 100 mL via INTRAVENOUS

## 2014-03-09 MED ORDER — METRONIDAZOLE 500 MG PO TABS: 500.0000 mg | ORAL_TABLET | Freq: Two times a day (BID) | ORAL | Status: DC

## 2014-03-09 MED ORDER — DEXTROSE 5 % IV SOLN
1.0000 g | Freq: Once | INTRAVENOUS | Status: AC
  Administered 2014-03-09: 1 g via INTRAVENOUS
  Filled 2014-03-09: qty 10

## 2014-03-09 MED ORDER — HYDROMORPHONE HCL PF 1 MG/ML IJ SOLN
1.0000 mg | Freq: Once | INTRAMUSCULAR | Status: AC
  Administered 2014-03-09: 1 mg via INTRAVENOUS
  Filled 2014-03-09: qty 1

## 2014-03-09 MED ORDER — TAMSULOSIN HCL 0.4 MG PO CAPS
0.4000 mg | ORAL_CAPSULE | Freq: Once | ORAL | Status: AC
  Administered 2014-03-09: 0.4 mg via ORAL
  Filled 2014-03-09: qty 1

## 2014-03-09 MED ORDER — METRONIDAZOLE 500 MG PO TABS: 2000.0000 mg | ORAL_TABLET | Freq: Once | ORAL | Status: DC

## 2014-03-09 MED ORDER — ONDANSETRON HCL 4 MG/2ML IJ SOLN
4.0000 mg | Freq: Once | INTRAMUSCULAR | Status: AC
  Administered 2014-03-09: 4 mg via INTRAVENOUS
  Filled 2014-03-09: qty 2

## 2014-03-09 MED ORDER — CIPROFLOXACIN IN D5W 400 MG/200ML IV SOLN: 400.0000 mg | Freq: Once | INTRAVENOUS | Status: DC

## 2014-03-09 NOTE — ED Notes (Signed)
Pt tolerating oral fluids 

## 2014-03-09 NOTE — ED Provider Notes (Signed)
CSN: [id number]     Arrival date & time 03/09/14  1538 History   First MD Initiated Contact with Patient 03/09/14 1618     Chief Complaint  Patient presents with  . Abdominal Pain     (Consider location/radiation/quality/duration/timing/severity/associated sxs/prior Treatment) HPI  Patient with a PMH of hypertension, STEMI, arthritis, malaria, and PTSD presents to the ER with  Complaints of severe LLQ abdominal pain. He has been nauseous but has not had any vomiting. He reports having diverticulitis and kidney stones before but says this doesn't feel anything like that and this is more severe. He last had a bowel movement today, he has not had any blood in vomit, urine or bowel movements. He denies CP, SOB, weakness. He denies ever having any bowel surgeries in the past.  The pains started a few days ago and have been progressively getting worse.   Past Medical History  Diagnosis Date  . Hypertension   . Hypercholesterolemia   . Anginal pain   . DDD (degenerative disc disease), cervical   . Arthritis     "in my back and neck" (08/31/2012)  . Malaria     "I've had it a couple times in the 1960's; almost died from it" (Aug 31, 2012)  . PTSD (post-traumatic stress disorder)   . NSTEMI (non-ST elevated myocardial infarction) 11/2011    anterior  . NSTEMI (non-ST elevated myocardial infarction) 08/31/12  . Coronary artery disease   . CAD, CABG X 09 Nov 2011. re-do CABG X1 09/19/12 12/04/2011  . S/P angioplasty with stent,  10/31/12 VG to OM that was new graft from 11/13 in setting of NSTEMI 05/05/2013   Past Surgical History  Procedure Laterality Date  . Back surgery    . Lumbar disc surgery  1980's?  . Tonsillectomy      "as a kid" (31-Aug-2012)  . Cataract extraction w/ intraocular lens  implant, bilateral  1990's  . Coronary artery bypass graft  12/02/2011    Procedure: CORONARY ARTERY BYPASS GRAFTING (CABG);  Surgeon: Gaye Pollack, MD;  Location: [facility];  Service: Open Heart  Surgery;  Laterality: N/A;  . Coronary artery bypass graft  09/19/2012    Procedure: REDO CORONARY ARTERY BYPASS GRAFTING (CABG);  Surgeon: Gaye Pollack, MD;  Location: [facility];  Service: Open Heart Surgery;  Laterality: N/A;  . Cardiac catheterization  11/2011  . Coronary angioplasty  10/2012    stent placed SVG to OM   . US echocardiography  02/29/2012  . Transthoracic echocardiogram  10/18/2012    LV EF 40%- 45%    Family History  Problem Relation Age of Onset  . Heart disease Mother   . Diabetes Mother   . Heart disease Father   . Diabetes Father   . Healthy Sister   . Diabetes Brother   . Diabetes Sister   . Diabetes Brother    History  Substance Use Topics  . Smoking status: Former Smoker -- 0.50 packs/day for 50 years    Types: Cigarettes    Quit date: 12/02/2011  . Smokeless tobacco: Never Used  . Alcohol Use: No     Comment: 08-31-2012 "haven't had a drink in > 25 years; never had problem w/it"    Review of Systems   Review of Systems  Gen: no weight loss, fevers, chills, night sweats  Eyes: no discharge or drainage, no occular pain or visual changes  Nose: no epistaxis or rhinorrhea  Mouth: no dental pain, no sore throat  Neck: no  neck pain  Lungs:No wheezing, coughing or hemoptysis CV: no chest pain, palpitations, dependent edema or orthopnea  Abd: + abdominal pain, No nausea, vomiting, diarrhea GU: no dysuria or gross hematuria  MSK:  No muscle weakness or pain Neuro: no headache, no focal neurologic deficits  Skin: no rash or wounds Psyche: no complaints    Allergies  Review of patient's allergies indicates no known allergies.  Home Medications   Prior to Admission medications   Medication Sig Start Date End Date Taking? Authorizing Provider  amLODipine (NORVASC) 5 MG tablet Take 1 tablet (5 mg total) by mouth daily. 10/25/12   Erlene Quan, PA-C  aspirin EC 325 MG EC tablet Take 1 tablet (325 mg total) by mouth daily. 11/01/12   Tarri Fuller,  PA-C  atorvastatin (LIPITOR) 10 MG tablet Take 20 mg by mouth daily.     Historical Provider, MD  carvedilol (COREG) 3.125 MG tablet TAKE ONE TABLET BY MOUTH TWICE DAILY WITH MEALS 10/25/13   Lorretta Harp, MD  ferrous sulfate 325 (65 FE) MG tablet Take 1 tablet (325 mg total) by mouth 3 (three) times daily with meals. 10/25/12   Erlene Quan, PA-C  finasteride (PROSCAR) 5 MG tablet Take 5 mg by mouth daily.    Historical Provider, MD  HYDROcodone-acetaminophen (NORCO) 10-325 MG per tablet Take 1 tablet by mouth every 6 (six) hours as needed for pain. 10/14/12   Erin Barrett, PA-C  isosorbide mononitrate (IMDUR) 30 MG 24 hr tablet Take 1 tablet (30 mg total) by mouth daily. 04/23/13   Mihai Croitoru, MD  nitroGLYCERIN (NITROSTAT) 0.4 MG SL tablet Place 1 tablet (0.4 mg total) under the tongue every 5 (five) minutes x 3 doses as needed for chest pain. 10/07/12   Tarri Fuller, PA-C  omeprazole (PRILOSEC) 20 MG capsule Take 20 mg by mouth 2 (two) times daily.    Historical Provider, MD  prasugrel (EFFIENT) 10 MG TABS Take 1 tablet (10 mg total) by mouth daily. 04/29/13   Lorretta Harp, MD  terazosin (HYTRIN) 10 MG capsule Take 10 mg by mouth at bedtime.    Historical Provider, MD   BP 136/69  Pulse 69  Temp(Src) 98.6 F (37 C) (Rectal)  Resp 18  Ht 5\' 10"  (1.778 m)  Wt 195 lb (88.451 kg)  BMI 27.98 kg/m2  SpO2 99% Physical Exam  Nursing note and vitals reviewed. Constitutional: He appears well-developed and well-nourished. No distress.  HENT:  Head: Normocephalic and atraumatic.  Eyes: Pupils are equal, round, and reactive to light.  Neck: Normal range of motion. Neck supple.  Cardiovascular: Normal rate and regular rhythm.   Pulmonary/Chest: Effort normal.  Abdominal: Soft. Bowel sounds are normal. He exhibits distension. He exhibits no fluid wave and no ascites. There is tenderness in the left lower quadrant. There is guarding. There is no rebound and no CVA tenderness.  Neurological:  He is alert.  Skin: Skin is warm and dry.    ED Course  Procedures (including critical care time) Labs Review Labs Reviewed  CBC WITH DIFFERENTIAL - Abnormal; Notable for the following:    WBC 12.4 (*)    RBC 4.21 (*)    Neutro Abs 9.4 (*)    All other components within normal limits  COMPREHENSIVE METABOLIC PANEL - Abnormal; Notable for the following:    Potassium 3.5 (*)    Glucose, Bld 100 (*)    All other components within normal limits  URINALYSIS, ROUTINE W REFLEX MICROSCOPIC - Abnormal;  Notable for the following:    Color, Urine AMBER (*)    APPearance HAZY (*)    Specific Gravity, Urine 1.037 (*)    Glucose, UA 250 (*)    Hgb urine dipstick LARGE (*)    Protein, ur 30 (*)    Leukocytes, UA TRACE (*)    All other components within normal limits  URINE MICROSCOPIC-ADD ON - Abnormal; Notable for the following:    Crystals CA OXALATE CRYSTALS (*)    All other components within normal limits  URINE CULTURE    Imaging Review Ct Abdomen Pelvis W Contrast  03/09/2014   CLINICAL DATA:  Severe left-sided abdominal pain.  EXAM: CT ABDOMEN AND PELVIS WITH CONTRAST  TECHNIQUE: Multidetector CT imaging of the abdomen and pelvis was performed using the standard protocol following bolus administration of intravenous contrast.  CONTRAST:  165mL OMNIPAQUE IOHEXOL 300 MG/ML  SOLN  COMPARISON:  06/28/2005  FINDINGS: Scattered 3-4 mm hypodensities in the liver parenchyma are too small to characterize but likely represent tiny cysts. Spleen is unremarkable. There is a small hiatal hernia. Stomach is distended but otherwise normal in appearance. The duodenum and pancreas are unremarkable. No evidence for gallstones. Left adrenal gland is normal. 2.5 cm right adrenal nodule is not substantially changed in the interval and had attenuation on the previous on infused exam consistent with adrenal adenoma.  1.6 cm well-defined water density lesion in the upper pole of the right kidney is compatible with  a cyst. There is a smaller 13 mm lesion in the upper pole the right kidney which has attenuation too high to allow classification as a simple cyst. A small interpolar lesion is unchanged in the interval. There is a tiny nonobstructing stone in the lower pole the right kidney.  There is mild left hydronephrosis. Delayed perfusion of the left kidney is evident. The left ureter is mildly distended into the pelvis where a 6 x 3 x 6 mm distal left ureteral stone is identified approximately 2 cm proximal to the UVJ. No right ureteral or bladder stones.  No free fluid or lymphadenopathy in the abdomen.  Imaging through the pelvis shows diverticular changes in the left colon. There is wall thickening in the sigmoid segment in an area of pericolonic edema/ inflammation associated with the proximal sigmoid colon.  The terminal ileum is normal. The appendix is normal. Prostate gland is enlarged.  Bone windows show a stable benign appearing cystic lesion in the left iliac bone, adjacent to the SI joint.  IMPRESSION: Two separate acute findings in this individual as described in #1 and #2 below:  1. 6 x 3 x 6 mm distal left ureteral stone causes changes of obstructive uropathy in the left kidney with mild hydronephrosis and decreased perfusion compared to the contralateral side. 2. Pericolonic edema/inflammation associated with the proximal sigmoid colon on a background of left colonic diverticulosis. Imaging features are probably related to acute diverticulitis without perforation or abscess. Is colorectal neoplasm can present with similar CT imaging features, correlation with colorectal cancer screening history is recommended and consider GI followup. 3. Stable right renal cyst with a second smaller 13 mm lesion in the right kidney having attenuation 2 height allow classification as a simple cyst. This may be a cyst complicated by proteinaceous debris or hemorrhage, but MRI without and with contrast is recommended to confirm.  4. 2.5 cm right adrenal adenoma.   Electronically Signed   By: Misty Stanley M.D.   On: 03/09/2014 18:09  EKG Interpretation None      MDM   Final diagnoses:  Kidney stone  Acute diverticulitis    Patients pain under control now with IV medications. I spoke with Dr. Junious Silk regarding the stone. He reviewed the image results and says that the patient needs tamsulosin and would benefit from Toradol for pain control. From a urology stand point this does not require intervenion or need admission. His renal function is WNL, and he has no UTI.   The patient does have concern for acute diverticulitis as well. He is afebrile and has not had nausea, vomiting or diarrhea. I gave the patient the option to go home after IV abx or to stay in the hospital overnight for pain control and a few more doses of IV abx. He prefers to go home, his daughter and wife are agreeable with this.  Will be given 1 g IV Rocephin and 500 mg IV Metronidazole, Tamsulosin and Toradol before discharge.   For home I will give cipro and flagyl, Percocet 10's, Zofran and Tamsulosin Rx.   68 y.o.Darrell Mayer Jr.'s evaluation in the Emergency Department is complete. It has been determined that no acute conditions requiring further emergency intervention are present at this time. The patient/guardian have been advised of the diagnosis and plan. We have discussed signs and symptoms that warrant return to the ED, such as changes or worsening in symptoms.  Vital signs are stable at discharge. Filed Vitals:   03/09/14 1803  BP: 136/69  Pulse: 69  Temp:   Resp: 18    Patient/guardian has voiced understanding and agreed to follow-up with the PCP or specialist.     Linus Mako, PA-C 03/09/14 1931

## 2014-03-09 NOTE — ED Notes (Signed)
Pt finished with contrast, CT notified. 

## 2014-03-09 NOTE — ED Notes (Signed)
Per pt sts lower abdominal pain for the past few days and severe today. sts LBM today and normal. Denies blood. sts he vomited today.

## 2014-03-09 NOTE — Discharge Instructions (Signed)
Diverticulitis °A diverticulum is a small pouch or sac on the colon. Diverticulosis is the presence of these diverticula on the colon. Diverticulitis is the irritation (inflammation) or infection of diverticula. °CAUSES  °The colon and its diverticula contain bacteria. If food particles block the tiny opening to a diverticulum, the bacteria inside can grow and cause an increase in pressure. This leads to infection and inflammation and is called diverticulitis. °SYMPTOMS  °· Abdominal pain and tenderness. Usually, the pain is located on the left side of your abdomen. However, it could be located elsewhere. °· Fever. °· Bloating. °· Feeling sick to your stomach (nausea). °· Throwing up (vomiting). °· Abnormal stools. °DIAGNOSIS  °Your caregiver will take a history and perform a physical exam. Since many things can cause abdominal pain, other tests may be necessary. Tests may include: °· Blood tests. °· Urine tests. °· X-ray of the abdomen. °· CT scan of the abdomen. °Sometimes, surgery is needed to determine if diverticulitis or other conditions are causing your symptoms. °TREATMENT  °Most of the time, you can be treated without surgery. Treatment includes: °· Resting the bowels by only having liquids for a few days. As you improve, you will need to eat a low-fiber diet. °· Intravenous (IV) fluids if you are losing body fluids (dehydrated). °· Antibiotic medicines that treat infections may be given. °· Pain and nausea medicine, if needed. °· Surgery if the inflamed diverticulum has burst. °HOME CARE INSTRUCTIONS  °· Try a clear liquid diet (broth, tea, or water for as long as directed by your caregiver). You may then gradually begin a low-fiber diet as tolerated.  °A low-fiber diet is a diet with less than 10 grams of fiber. Choose the foods below to reduce fiber in the diet: °· White breads, cereals, rice, and pasta. °· Cooked fruits and vegetables or soft fresh fruits and vegetables without the skin. °· Ground or  well-cooked tender beef, ham, veal, lamb, pork, or poultry. °· Eggs and seafood. °· After your diverticulitis symptoms have improved, your caregiver may put you on a high-fiber diet. A high-fiber diet includes 14 grams of fiber for every 1000 calories consumed. For a standard 2000 calorie diet, you would need 28 grams of fiber. Follow these diet guidelines to help you increase the fiber in your diet. It is important to slowly increase the amount fiber in your diet to avoid gas, constipation, and bloating. °· Choose whole-grain breads, cereals, pasta, and brown rice. °· Choose fresh fruits and vegetables with the skin on. Do not overcook vegetables because the more vegetables are cooked, the more fiber is lost. °· Choose more nuts, seeds, legumes, dried peas, beans, and lentils. °· Look for food products that have greater than 3 grams of fiber per serving on the Nutrition Facts label. °· Take all medicine as directed by your caregiver. °· If your caregiver has given you a follow-up appointment, it is very important that you go. Not going could result in lasting (chronic) or permanent injury, pain, and disability. If there is any problem keeping the appointment, call to reschedule. °SEEK MEDICAL CARE IF:  °· Your pain does not improve. °· You have a hard time advancing your diet beyond clear liquids. °· Your bowel movements do not return to normal. °SEEK IMMEDIATE MEDICAL CARE IF:  °· Your pain becomes worse. °· You have an oral temperature above 102° F (38.9° C), not controlled by medicine. °· You have repeated vomiting. °· You have bloody or black, tarry stools. °·   Symptoms that brought you to your caregiver become worse or are not getting better. MAKE SURE YOU:   Understand these instructions.  Will watch your condition.  Will get help right away if you are not doing well or get worse. Document Released: 08/02/2005 Document Revised: 01/15/2012 Document Reviewed: 11/28/2010 [facility] Patient Information  2014 [place].  Kidney Stones Kidney stones (urolithiasis) are deposits that form inside your kidneys. The intense pain is caused by the stone moving through the urinary tract. When the stone moves, the ureter goes into spasm around the stone. The stone is usually passed in the urine.  CAUSES   A disorder that makes certain neck glands produce too much parathyroid hormone (primary hyperparathyroidism).  A buildup of uric acid crystals, similar to gout in your joints.  Narrowing (stricture) of the ureter.  A kidney obstruction present at birth (congenital obstruction).  Previous surgery on the kidney or ureters.  Numerous kidney infections. SYMPTOMS   Feeling sick to your stomach (nauseous).  Throwing up (vomiting).  Blood in the urine (hematuria).  Pain that usually spreads (radiates) to the groin.  Frequency or urgency of urination. DIAGNOSIS   Taking a history and physical exam.  Blood or urine tests.  CT scan.  Occasionally, an examination of the inside of the urinary bladder (cystoscopy) is performed. TREATMENT   Observation.  Increasing your fluid intake.  Extracorporeal shock wave lithotripsy This is a noninvasive procedure that uses shock waves to break up kidney stones.  Surgery may be needed if you have severe pain or persistent obstruction. There are various surgical procedures. Most of the procedures are performed with the use of small instruments. Only small incisions are needed to accommodate these instruments, so recovery time is minimized. The size, location, and chemical composition are all important variables that will determine the proper choice of action for you. Talk to your health care provider to better understand your situation so that you will minimize the risk of injury to yourself and your kidney.  HOME CARE INSTRUCTIONS   Drink enough water and fluids to keep your urine clear or pale yellow. This will help you to pass the stone or  stone fragments.  Strain all urine through the provided strainer. Keep all particulate matter and stones for your health care provider to see. The stone causing the pain may be as small as a grain of salt. It is very important to use the strainer each and every time you pass your urine. The collection of your stone will allow your health care provider to analyze it and verify that a stone has actually passed. The stone analysis will often identify what you can do to reduce the incidence of recurrences.  Only take over-the-counter or prescription medicines for pain, discomfort, or fever as directed by your health care provider.  Make a follow-up appointment with your health care provider as directed.  Get follow-up X-rays if required. The absence of pain does not always mean that the stone has passed. It may have only stopped moving. If the urine remains completely obstructed, it can cause loss of kidney function or even complete destruction of the kidney. It is your responsibility to make sure X-rays and follow-ups are completed. Ultrasounds of the kidney can show blockages and the status of the kidney. Ultrasounds are not associated with any radiation and can be performed easily in a matter of minutes. SEEK MEDICAL CARE IF:  You experience pain that is progressive and unresponsive to any pain medicine you  have been prescribed. SEEK IMMEDIATE MEDICAL CARE IF:   Pain cannot be controlled with the prescribed medicine.  You have a fever or shaking chills.  The severity or intensity of pain increases over 18 hours and is not relieved by pain medicine.  You develop a new onset of abdominal pain.  You feel faint or pass out.  You are unable to urinate. MAKE SURE YOU:   Understand these instructions.  Will watch your condition.  Will get help right away if you are not doing well or get worse. Document Released: 10/23/2005 Document Revised: 06/25/2013 Document Reviewed: 03/26/2013 [facility]  Patient Information 2014 [place].  Diet for Kidney Stones Kidney stones are small, hard masses that form inside your kidneys. They are made up of salts and minerals and often form when high levels build up in the urine. The minerals can then start to build up, crystalize, and stick together to form stones. There are several different types of kidney stones. The following types of stones may be influenced by dietary factors:   Calcium Oxalate Stones. An oxalate is a salt found in certain foods. Within the body, calcium can combine with oxalates to form calcium oxalate stones, which can be excreted in the urine in high amounts. This is the most common type of kidney stone.  Calcium Phosphate Stones. These stones may occur when the pH of the urine becomes too high, or less acidic, from too much calcium being excreted in the urine. The pH is a measure of how acidic or basic a substance is.  Uric Acid Stones. This type of stone occurs when the pH of the urine becomes too low, or very acidic, because substances called purines build up in the urine. Purines are found in animal proteins. When the urine is highly concentrated with acid, uric acid kidney stones can form.  Other risk factors for kidney stones include genetics, environment, and being overweight. Your caregiver may ask you to follow specific diet guidelines based on the type of stone you have to lessen the chances of your body making more kidney stones.  GENERAL GUIDELINES FOR ALL TYPES OF STONES  Drink plenty of fluid. Drink 12 16 cups of fluid a day, drinking mainly water.This is the most important thing you can do to prevent the formation of future kidney stones.  Maintain a healthy weight. Your caregiver or dietitian can help you determine what a healthy weight is for you. If you are overweight, weight loss may help prevent the formation of future kidney stones.  Eat a diet adequate in animal protein. Too much animal protein can  contribute to the formation of stones. Your dietitian can help you determine how much protein you should be eating. Avoid low carbohydrate, high protein diets.  Follow a balanced eating approach. The DASH diet, which stands for "Dietary Approaches to Stop Hypertension," is an effective meal plan for reducing stone formation. This diet is high in fruits, vegetables, dairy, and whole grains and low in animal protein. Ask your caregiver or dietitian for information about the DASH diet. ADDITIONAL DIET GUIDELINES FOR CALCIUM STONES Avoid foods high in salt. This includes table salt, salt seasonings, MSG, soy sauce, cured and processed meats, salted crackers and snack foods, fast food, and canned soups and foods. Ask your caregiver or dietitian for information about reducing sodium in your diet or following the low sodium diet.  Ensure adequate calcium intake. Use the following table for calcium guidelines:  Men 60 years old and younger  1000 mg/day.  Men 38 years old and older  1500 mg/day.  Women 53 67 years old  1000 mg/day.  Women 50 years and older  1500 mg/day. Your dietitian can help you determine if you are getting enough calcium in your diet. Foods that are high in calcium include dairy products, broccoli, cheese, yogurt, and pudding. If you need to take a calcium supplement, take it only in the form of calcium citrate.  Avoid foods high in oxalate. Be sure that any supplements you take do not contain more than 500 mg of vitamin C. Vitamin C is converted into oxalate in the body. You do not need to avoid fruits and vegetables high in vitamin C.   Grains: High-fiber or bran cereal, whole-wheat bread, grits, barley, buckwheat, amaranth, pretzels, and fruitcake.  Vegetables: Dried beans, wax beans, dark leafy greens, eggplant, leeks, okra, parsley, rutabaga, tomato paste, watercress, zucchini, and escarole.  Fruit: Dried apricots, red currants, figs, kiwi, and rhubarb.  Meat and Meat  Substitutes: Soybeans and foods made from soy (soyburger, miso), dried beans, peanut butter.  Milk: Chocolate milk mixes and soymilk.  Fats and Oils: Nuts (peanuts, almonds, pecans, cashews, hazelnuts) and nut butters, sesame seeds, and tDahini paste.  Condiments/Miscellaneous: Chocolate, carob, marmalade, poppy seeds, instant iced tea, and juice from high-oxalate fruits.  Document Released: 02/17/2011 Document Revised: 04/23/2012 Document Reviewed: 04/08/2012 [facility] Patient Information 2014 [employer].

## 2014-03-09 NOTE — ED Provider Notes (Signed)
Medical screening examination/treatment/procedure(s) were performed by non-physician practitioner and as supervising physician I was immediately available for consultation/collaboration.   EKG Interpretation None        Jihan Mellette, MD 03/09/14 [id number] 

## 2014-03-10 ENCOUNTER — Encounter (HOSPITAL_COMMUNITY): Payer: Medicare Other

## 2014-03-11 LAB — URINE CULTURE

## 2014-03-12 ENCOUNTER — Encounter (HOSPITAL_COMMUNITY): Payer: Medicare Other

## 2014-03-16 ENCOUNTER — Encounter (HOSPITAL_COMMUNITY): Payer: Medicare Other

## 2014-03-17 ENCOUNTER — Encounter (HOSPITAL_COMMUNITY): Payer: Medicare Other

## 2014-03-19 ENCOUNTER — Encounter (HOSPITAL_COMMUNITY): Payer: Medicare Other

## 2014-03-23 ENCOUNTER — Encounter (HOSPITAL_COMMUNITY)
Admission: RE | Admit: 2014-03-23 | Discharge: 2014-03-23 | Disposition: A | Discharge status: Home / Self Care | Payer: Self-pay | Source: Ambulatory Visit | Attending: Cardiovascular Disease | Admitting: Cardiovascular Disease

## 2014-03-24 ENCOUNTER — Encounter (HOSPITAL_COMMUNITY)
Admission: RE | Admit: 2014-03-24 | Discharge: 2014-03-24 | Disposition: A | Discharge status: Home / Self Care | Payer: Self-pay | Source: Ambulatory Visit | Attending: Cardiovascular Disease | Admitting: Cardiovascular Disease

## 2014-03-26 ENCOUNTER — Encounter (HOSPITAL_COMMUNITY)
Admission: RE | Admit: 2014-03-26 | Discharge: 2014-03-26 | Disposition: A | Discharge status: Home / Self Care | Payer: Self-pay | Source: Ambulatory Visit | Attending: Cardiovascular Disease | Admitting: Cardiovascular Disease

## 2014-03-31 ENCOUNTER — Encounter (HOSPITAL_COMMUNITY): Payer: Medicare Other

## 2014-04-02 ENCOUNTER — Encounter (HOSPITAL_COMMUNITY)
Admission: RE | Admit: 2014-04-02 | Discharge: 2014-04-02 | Disposition: A | Discharge status: Home / Self Care | Payer: Self-pay | Source: Ambulatory Visit | Attending: Cardiovascular Disease | Admitting: Cardiovascular Disease

## 2014-04-06 ENCOUNTER — Encounter (HOSPITAL_COMMUNITY)
Admission: RE | Admit: 2014-04-06 | Discharge: 2014-04-06 | Disposition: A | Discharge status: Home / Self Care | Payer: Self-pay | Source: Ambulatory Visit | Attending: Cardiovascular Disease | Admitting: Cardiovascular Disease

## 2014-04-06 DIAGNOSIS — Z5189 Encounter for other specified aftercare: Secondary | ICD-10-CM | POA: Insufficient documentation

## 2014-04-06 DIAGNOSIS — I252 Old myocardial infarction: Secondary | ICD-10-CM | POA: Insufficient documentation

## 2014-04-06 DIAGNOSIS — I10 Essential (primary) hypertension: Secondary | ICD-10-CM | POA: Insufficient documentation

## 2014-04-06 DIAGNOSIS — I2510 Atherosclerotic heart disease of native coronary artery without angina pectoris: Secondary | ICD-10-CM | POA: Insufficient documentation

## 2014-04-07 ENCOUNTER — Encounter (HOSPITAL_COMMUNITY)
Admission: RE | Admit: 2014-04-07 | Discharge: 2014-04-07 | Disposition: A | Discharge status: Home / Self Care | Payer: Self-pay | Source: Ambulatory Visit | Attending: Cardiovascular Disease | Admitting: Cardiovascular Disease

## 2014-04-09 ENCOUNTER — Encounter (HOSPITAL_COMMUNITY): Payer: Medicare Other

## 2014-04-13 ENCOUNTER — Encounter (HOSPITAL_COMMUNITY): Payer: Medicare Other

## 2014-04-14 ENCOUNTER — Encounter (HOSPITAL_COMMUNITY)
Admission: RE | Admit: 2014-04-14 | Discharge: 2014-04-14 | Disposition: A | Discharge status: Home / Self Care | Payer: Self-pay | Source: Ambulatory Visit | Attending: Cardiovascular Disease | Admitting: Cardiovascular Disease

## 2014-04-16 ENCOUNTER — Encounter (HOSPITAL_COMMUNITY): Payer: Medicare Other

## 2014-04-20 ENCOUNTER — Encounter (HOSPITAL_COMMUNITY): Payer: Medicare Other

## 2014-04-21 ENCOUNTER — Encounter (HOSPITAL_COMMUNITY): Payer: Medicare Other

## 2014-04-23 ENCOUNTER — Encounter (HOSPITAL_COMMUNITY): Payer: Medicare Other

## 2014-04-27 ENCOUNTER — Encounter (HOSPITAL_COMMUNITY): Payer: Medicare Other

## 2014-04-28 ENCOUNTER — Encounter (HOSPITAL_COMMUNITY)
Admission: RE | Admit: 2014-04-28 | Discharge: 2014-04-28 | Disposition: A | Discharge status: Home / Self Care | Payer: Self-pay | Source: Ambulatory Visit | Attending: Cardiovascular Disease | Admitting: Cardiovascular Disease

## 2014-04-30 ENCOUNTER — Encounter (HOSPITAL_COMMUNITY): Payer: Medicare Other

## 2014-05-04 ENCOUNTER — Encounter (HOSPITAL_COMMUNITY): Payer: Medicare Other

## 2014-05-05 ENCOUNTER — Encounter (HOSPITAL_COMMUNITY): Payer: Medicare Other

## 2014-05-07 ENCOUNTER — Encounter (HOSPITAL_COMMUNITY): Payer: Self-pay

## 2014-05-07 DIAGNOSIS — I2589 Other forms of chronic ischemic heart disease: Secondary | ICD-10-CM | POA: Insufficient documentation

## 2014-05-07 DIAGNOSIS — Z87891 Personal history of nicotine dependence: Secondary | ICD-10-CM | POA: Insufficient documentation

## 2014-05-07 DIAGNOSIS — Z5189 Encounter for other specified aftercare: Secondary | ICD-10-CM | POA: Insufficient documentation

## 2014-05-07 DIAGNOSIS — Z951 Presence of aortocoronary bypass graft: Secondary | ICD-10-CM | POA: Insufficient documentation

## 2014-05-07 DIAGNOSIS — I2510 Atherosclerotic heart disease of native coronary artery without angina pectoris: Secondary | ICD-10-CM | POA: Insufficient documentation

## 2014-05-07 DIAGNOSIS — I5031 Acute diastolic (congestive) heart failure: Secondary | ICD-10-CM | POA: Insufficient documentation

## 2014-05-07 DIAGNOSIS — I200 Unstable angina: Secondary | ICD-10-CM | POA: Insufficient documentation

## 2014-05-07 DIAGNOSIS — I219 Acute myocardial infarction, unspecified: Secondary | ICD-10-CM | POA: Insufficient documentation

## 2014-05-07 IMAGING — CR DG CHEST 1V PORT
1 series · 1 of 1 positions shown · non-contrast
Comparison: Portable exam 2247 hours compared to 12/25/2011

CLINICAL DATA: Mid chest pain

PORTABLE CHEST - 1 VIEW

[AP]
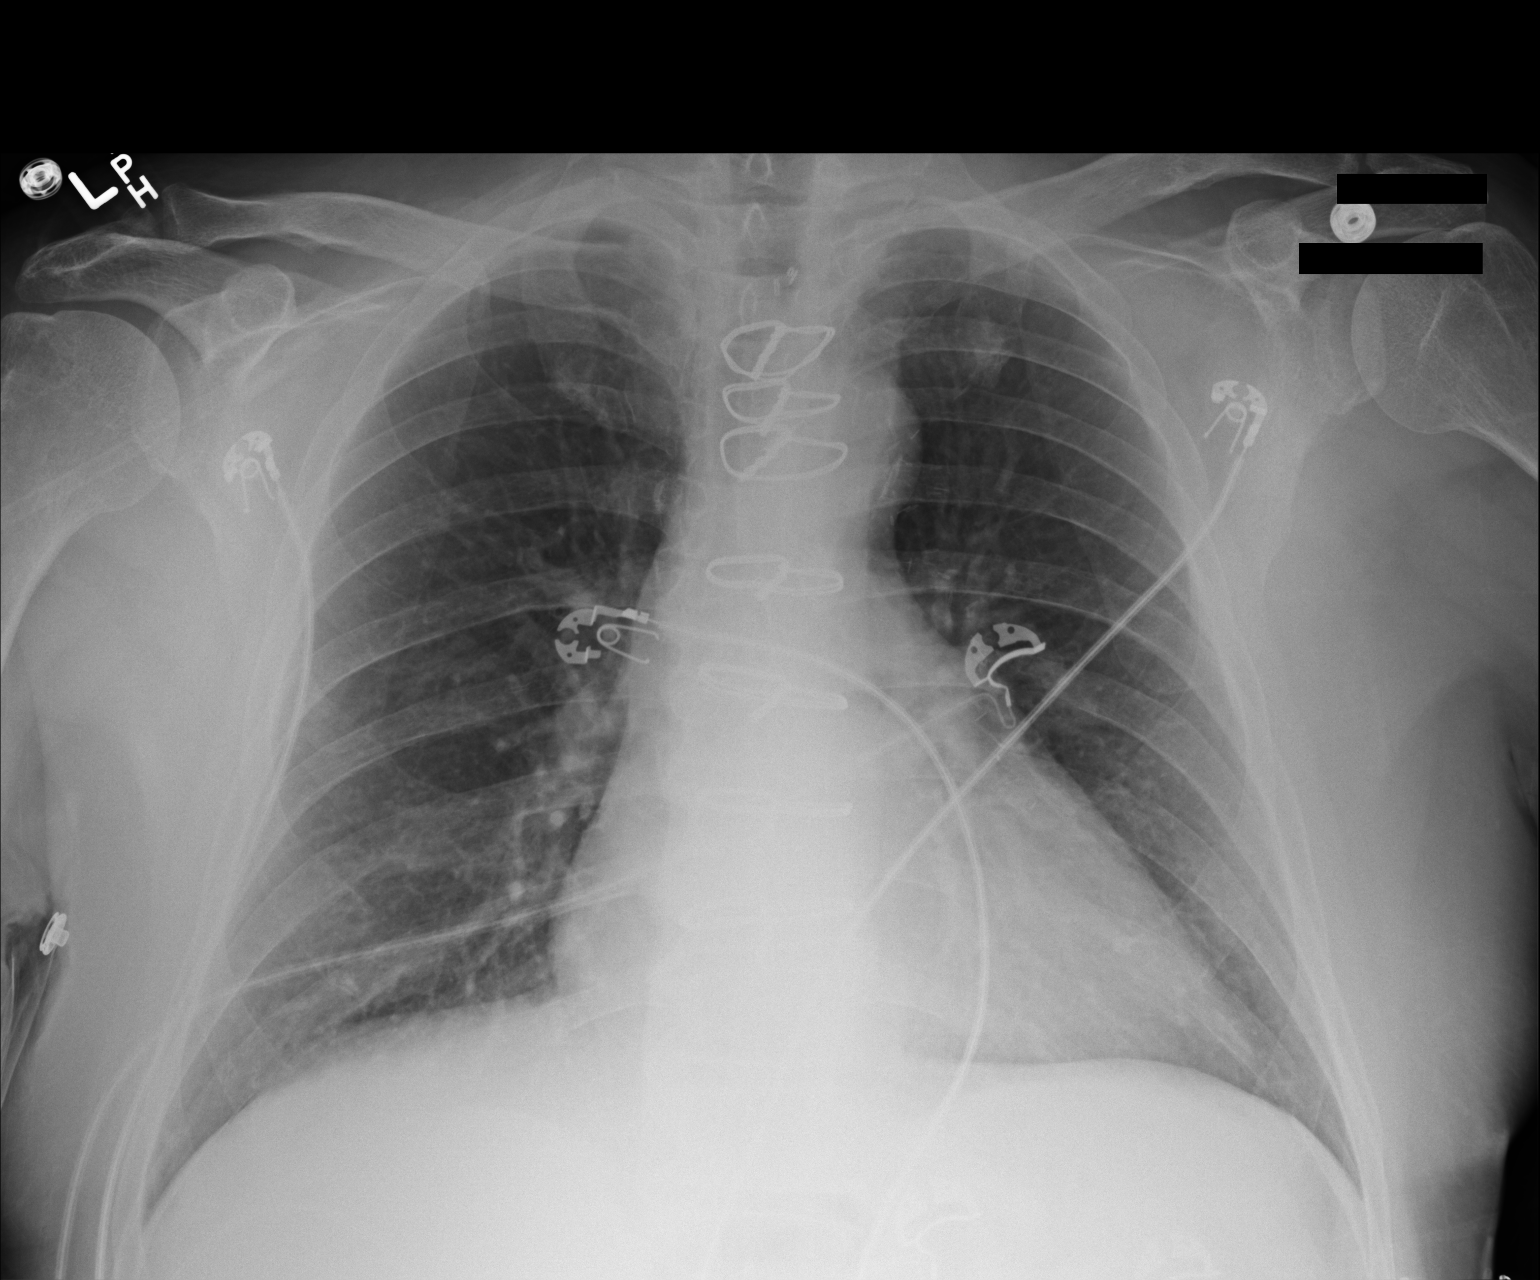

[1 of 1 positions shown; findings below may reference images not displayed]

FINDINGS: Mild enlargement of cardiac silhouette post CABG.
Mediastinal contours and pulmonary vascularity normal.
Mild atherosclerotic calcification aortic arch.
Lungs grossly clear.
No pleural effusion, pneumothorax or acute osseous findings.
IMPRESSION: Minimal enlargement of cardiac silhouette post CABG.
No acute abnormalities.

## 2014-05-11 ENCOUNTER — Encounter (HOSPITAL_COMMUNITY): Payer: Self-pay

## 2014-05-12 ENCOUNTER — Encounter (HOSPITAL_COMMUNITY): Payer: Self-pay

## 2014-05-14 ENCOUNTER — Encounter (HOSPITAL_COMMUNITY): Payer: Self-pay

## 2014-05-18 ENCOUNTER — Encounter (HOSPITAL_COMMUNITY): Payer: Self-pay

## 2014-05-19 ENCOUNTER — Encounter (HOSPITAL_COMMUNITY): Payer: Self-pay

## 2014-05-19 IMAGING — CR DG CHEST 1V PORT
1 series · 1 of 1 positions shown · non-contrast
Comparison: 08/29/2012

CLINICAL DATA: Chest pressure

PORTABLE CHEST - 1 VIEW

[AP]
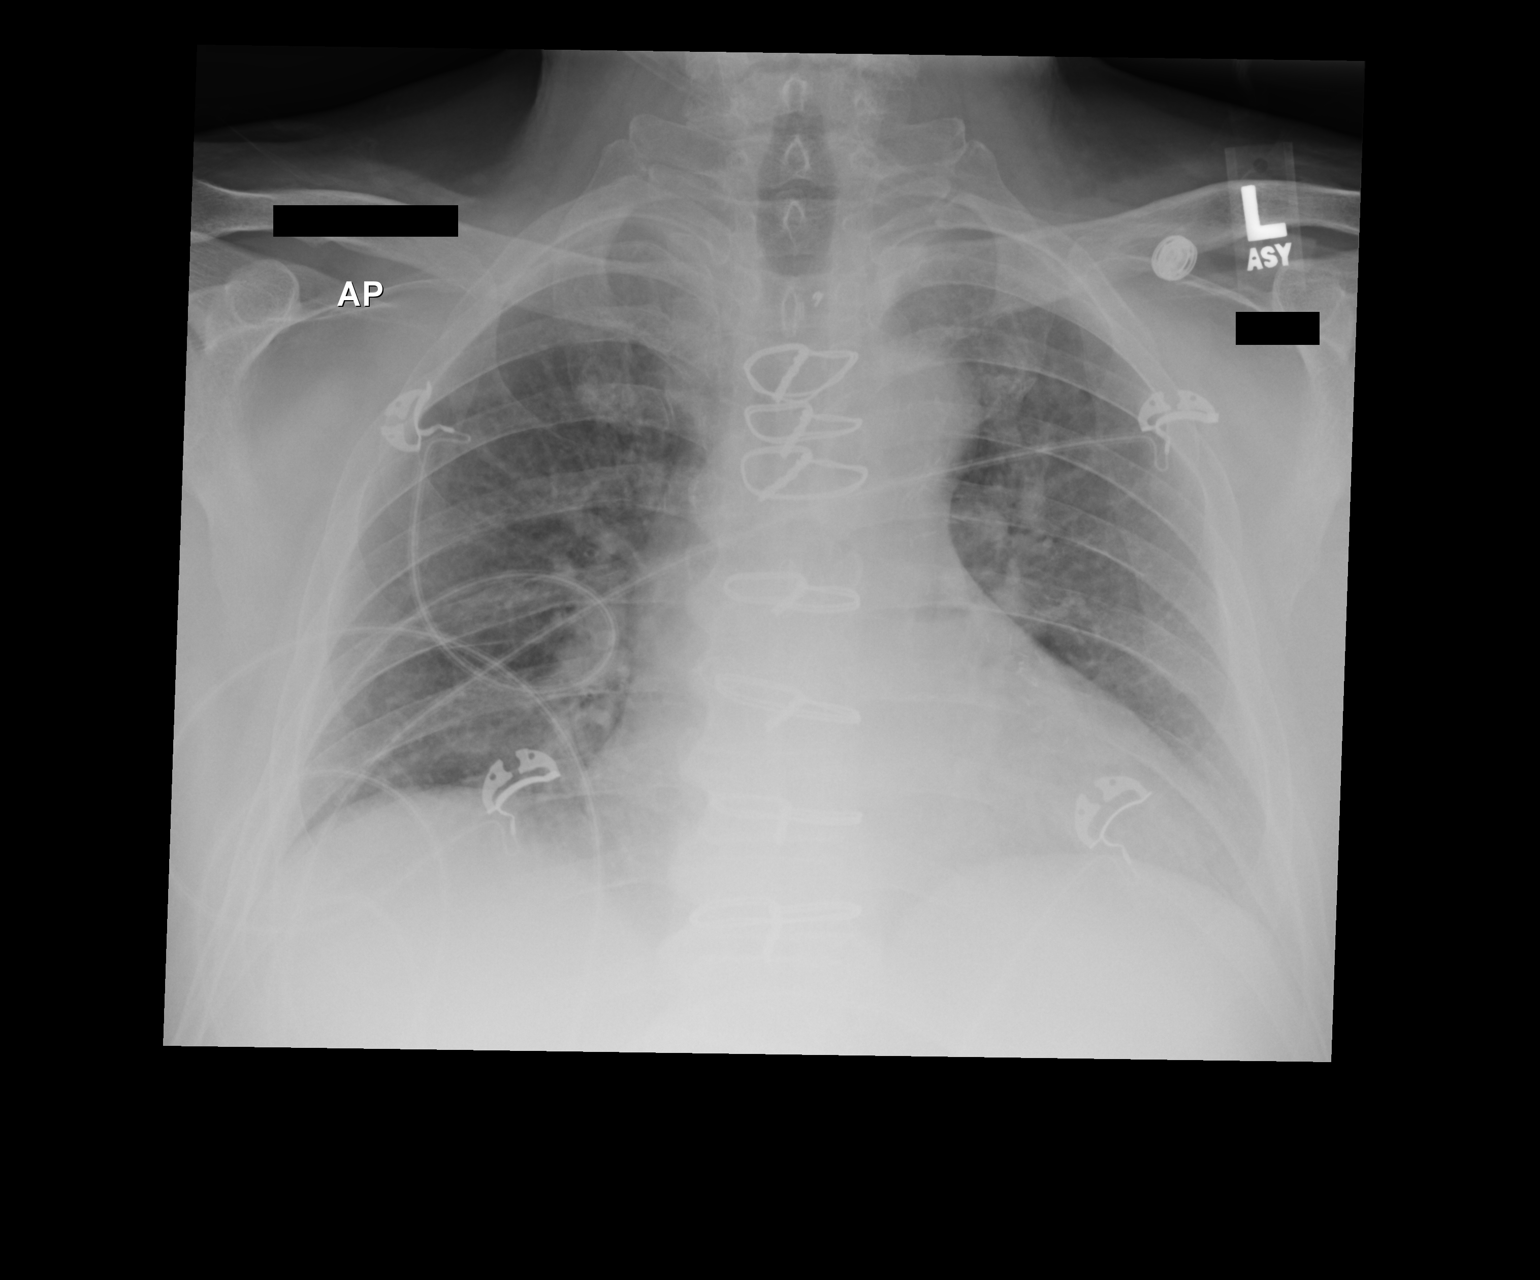

[1 of 1 positions shown; findings below may reference images not displayed]

FINDINGS: There is poor inspiration.  Cardiomegaly again noted.
Status post CABG.  Mild interstitial prominence bilaterally without
convincing pulmonary edema.  Mild basilar atelectasis.
IMPRESSION: Limited study by poor inspiration.  Mild basilar atelectasis.  No
convincing pulmonary edema.

## 2014-05-21 ENCOUNTER — Encounter (HOSPITAL_COMMUNITY): Payer: Self-pay

## 2014-05-25 ENCOUNTER — Encounter (HOSPITAL_COMMUNITY)
Admission: RE | Admit: 2014-05-25 | Discharge: 2014-05-25 | Disposition: A | Discharge status: Home / Self Care | Payer: Self-pay | Source: Ambulatory Visit | Attending: Cardiovascular Disease | Admitting: Cardiovascular Disease

## 2014-05-26 ENCOUNTER — Encounter (HOSPITAL_COMMUNITY): Payer: Self-pay

## 2014-05-27 ENCOUNTER — Telehealth: Payer: Self-pay | Admitting: Cardiovascular Disease

## 2014-05-27 MED ORDER — PRASUGREL HCL 10 MG PO TABS: 10.0000 mg | ORAL_TABLET | Freq: Every day | ORAL | Status: DC

## 2014-05-27 NOTE — Telephone Encounter (Signed)
Pt would like some samples of Effient 10 mg please.

## 2014-05-27 NOTE — Telephone Encounter (Signed)
I spoke to pt. Here in the office and he stated the [place] was still undecided and he was still waiting for a decision samples given to pt

## 2014-05-27 NOTE — Telephone Encounter (Signed)
Please call,[facility] says he does not need to be [facility] Eff[facility].She will give you the details on what he needs.

## 2014-05-27 NOTE — Telephone Encounter (Signed)
Samples of effient x 6 given.  LM to pick up from the front office.

## 2014-05-28 ENCOUNTER — Encounter (HOSPITAL_COMMUNITY): Payer: Self-pay

## 2014-05-31 IMAGING — CR DG CHEST 2V
2 series · 2 of 2 positions shown · non-contrast
Comparison: 09/21/2012

CLINICAL DATA: Post CABG.

CHEST - 2 VIEW

[w chest pa]
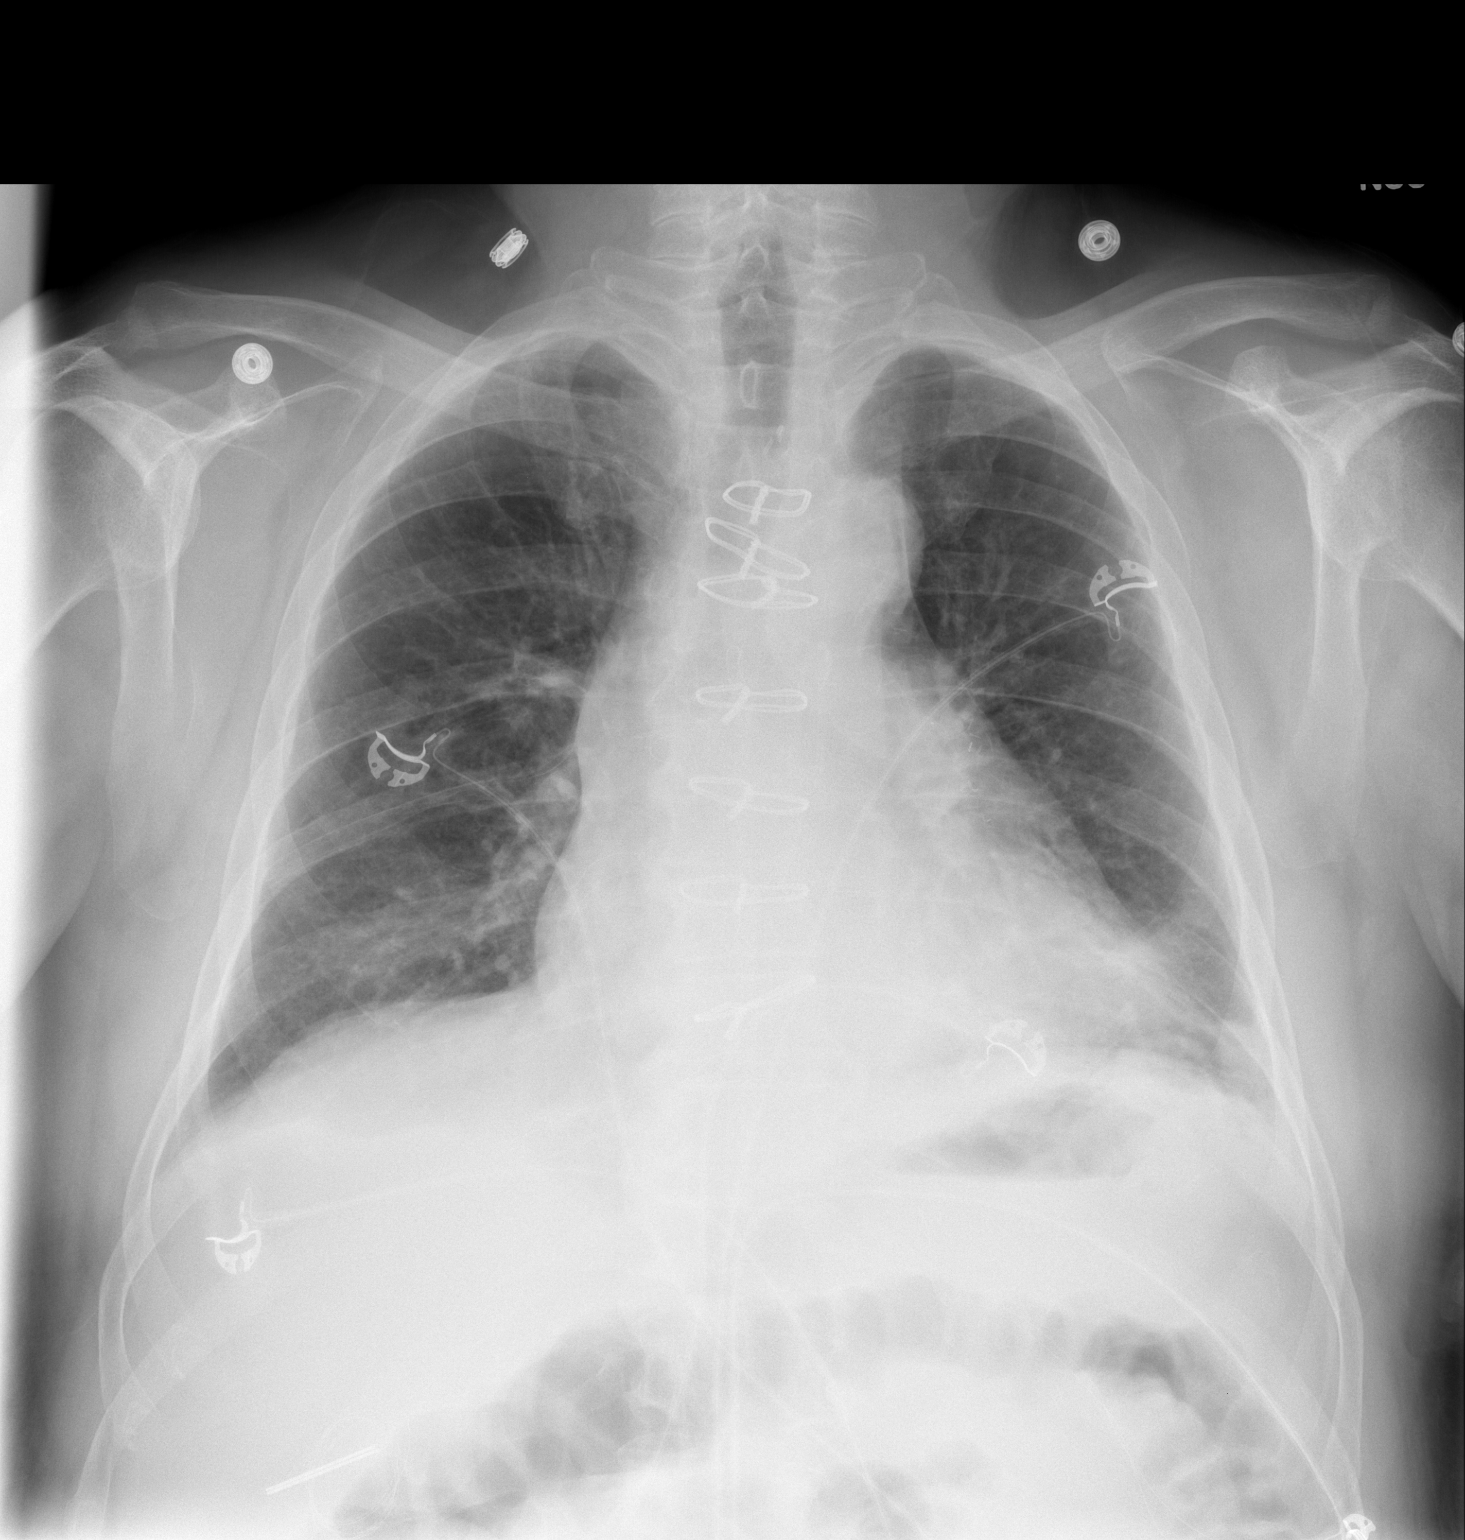

[w chest lat]
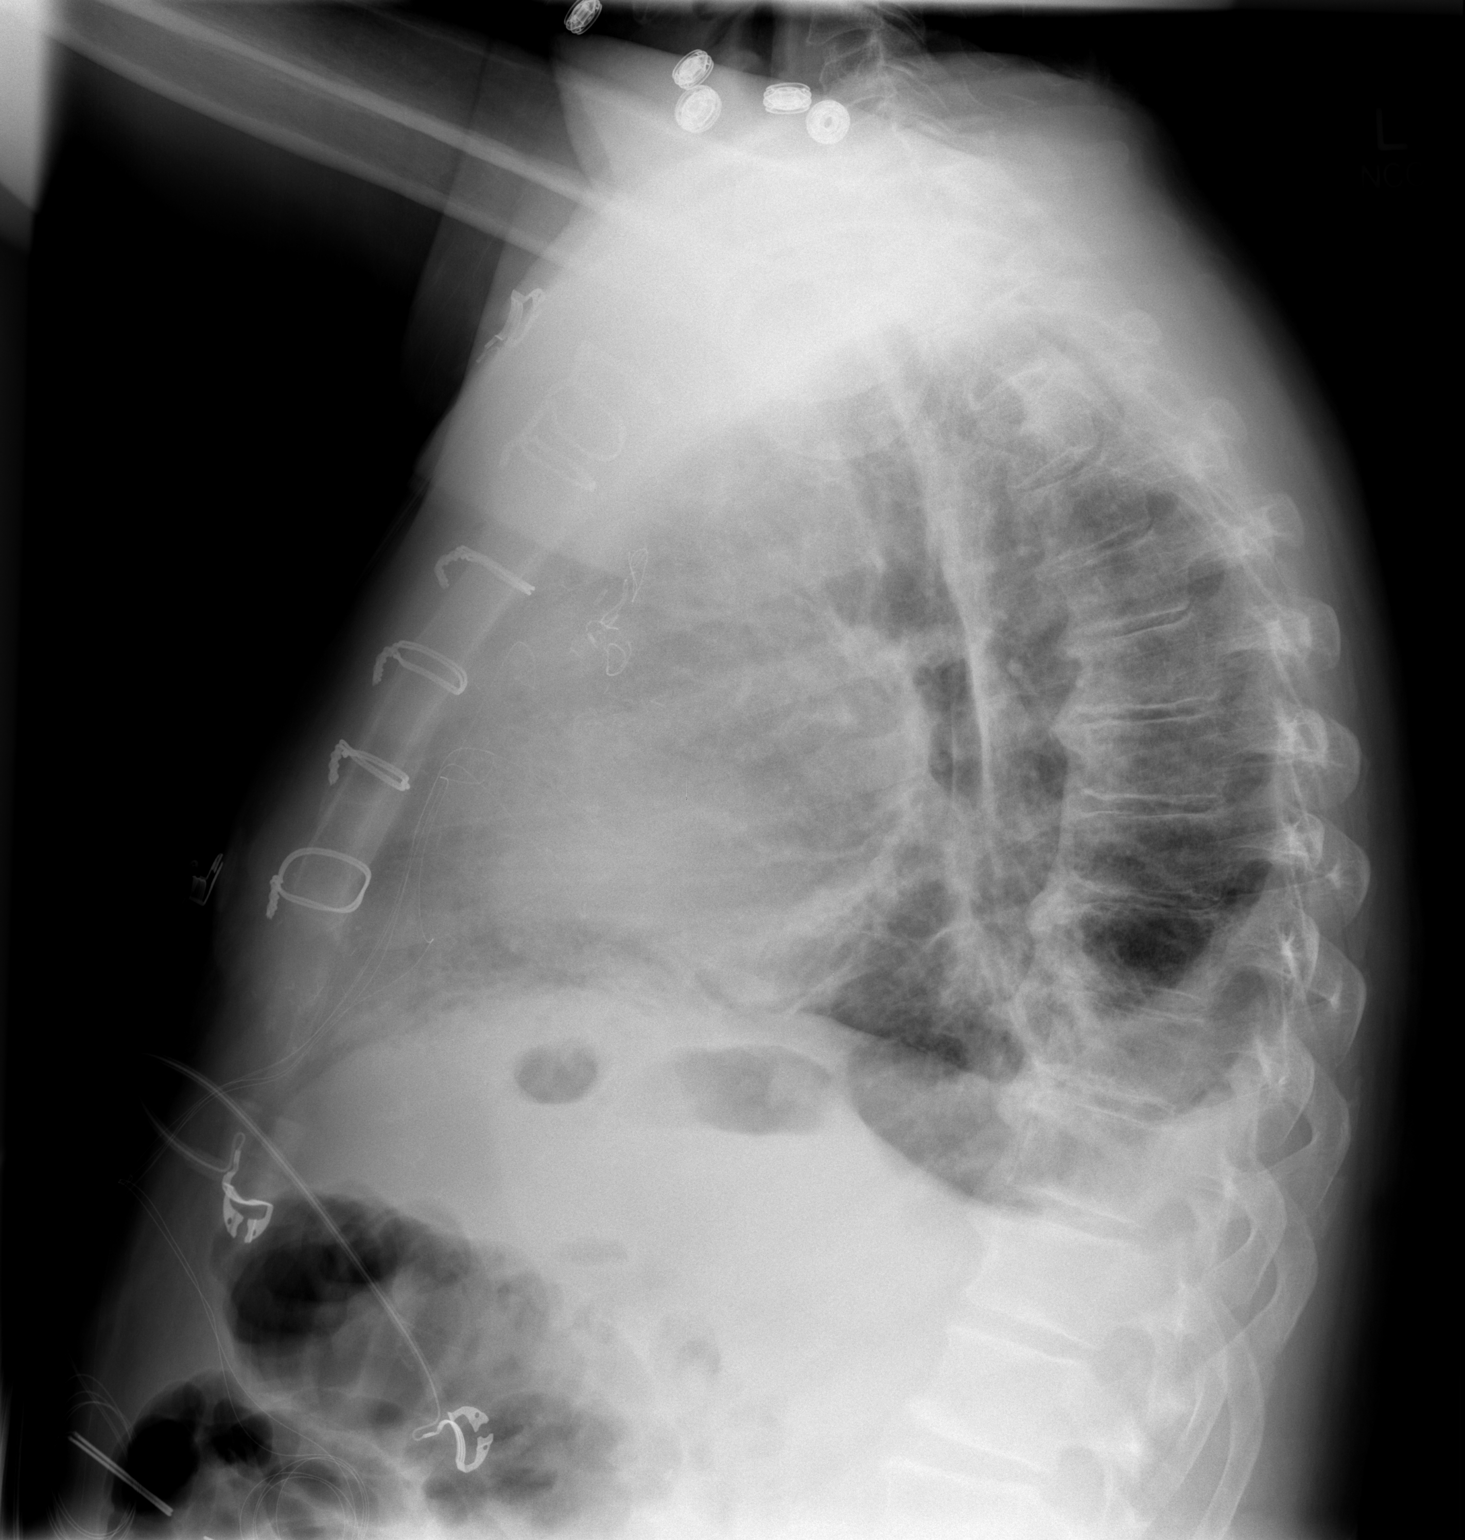

[2 of 2 positions shown; findings below may reference images not displayed]

FINDINGS: Left jugular central venous catheter has been removed.
Patchy densities at the left lung base are suggestive for volume
loss.  No evidence for a pneumothorax.  Median sternotomy wires are
present.  Trachea is midline. Evidence for small pleural effusions
on the lateral view.  Epicardial pacer wires are present.
IMPRESSION: Left basilar densities are most compatible with focal atelectasis.
Evidence for small pleural effusions.

## 2014-06-01 ENCOUNTER — Encounter (HOSPITAL_COMMUNITY): Payer: Self-pay

## 2014-06-02 ENCOUNTER — Encounter (HOSPITAL_COMMUNITY): Payer: Self-pay

## 2014-06-04 ENCOUNTER — Encounter (HOSPITAL_COMMUNITY): Payer: Self-pay

## 2014-06-08 ENCOUNTER — Encounter (HOSPITAL_COMMUNITY): Payer: Medicare Other

## 2014-06-08 DIAGNOSIS — I2589 Other forms of chronic ischemic heart disease: Secondary | ICD-10-CM | POA: Insufficient documentation

## 2014-06-08 DIAGNOSIS — I200 Unstable angina: Secondary | ICD-10-CM | POA: Insufficient documentation

## 2014-06-08 DIAGNOSIS — Z951 Presence of aortocoronary bypass graft: Secondary | ICD-10-CM | POA: Insufficient documentation

## 2014-06-08 DIAGNOSIS — Z5189 Encounter for other specified aftercare: Secondary | ICD-10-CM | POA: Insufficient documentation

## 2014-06-08 DIAGNOSIS — I219 Acute myocardial infarction, unspecified: Secondary | ICD-10-CM | POA: Insufficient documentation

## 2014-06-08 DIAGNOSIS — I5031 Acute diastolic (congestive) heart failure: Secondary | ICD-10-CM | POA: Insufficient documentation

## 2014-06-08 DIAGNOSIS — I2510 Atherosclerotic heart disease of native coronary artery without angina pectoris: Secondary | ICD-10-CM | POA: Insufficient documentation

## 2014-06-08 DIAGNOSIS — Z87891 Personal history of nicotine dependence: Secondary | ICD-10-CM | POA: Insufficient documentation

## 2014-06-09 ENCOUNTER — Encounter (HOSPITAL_COMMUNITY): Payer: Self-pay

## 2014-06-11 ENCOUNTER — Encounter (HOSPITAL_COMMUNITY): Payer: Self-pay

## 2014-06-12 IMAGING — CR DG CHEST 1V PORT
1 series · 1 of 1 positions shown · non-contrast
Comparison: Chest radiograph from 09/22/2012

CLINICAL DATA: Chest pain and shortness of breath.

PORTABLE CHEST - 1 VIEW

[AP]
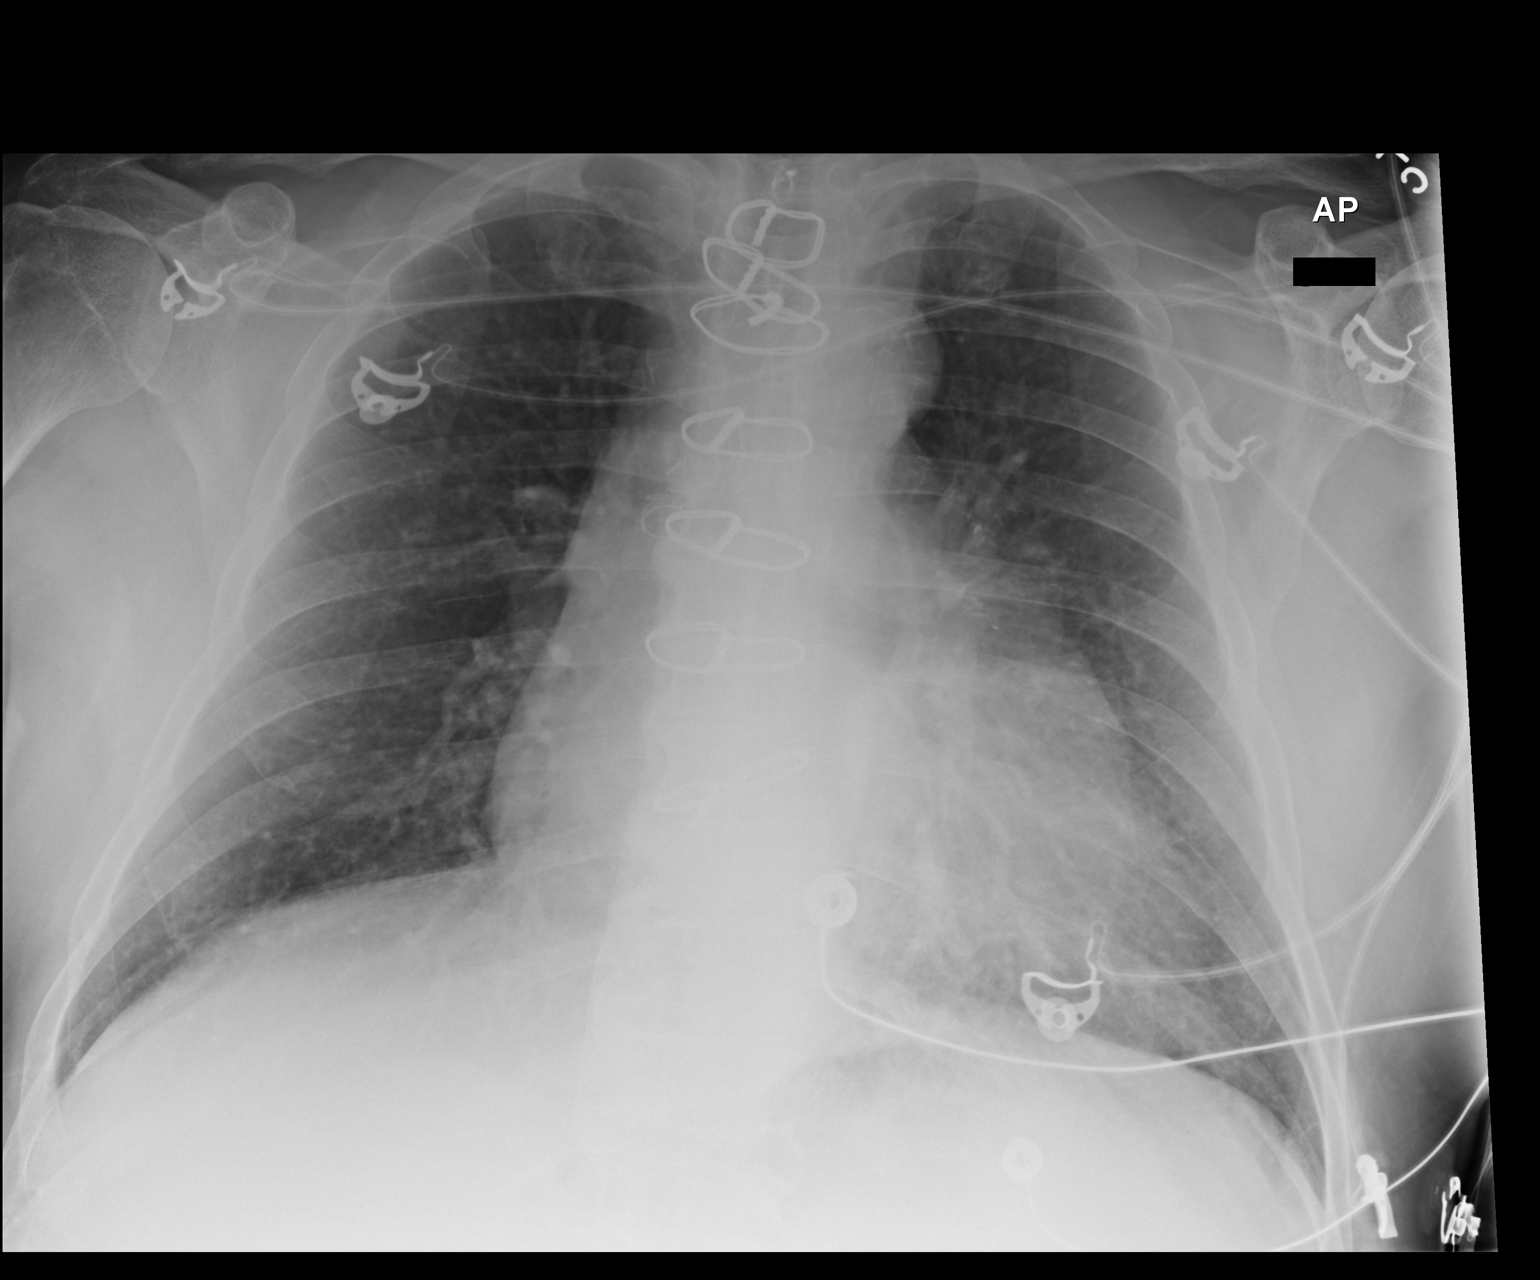

[1 of 1 positions shown; findings below may reference images not displayed]

FINDINGS: The lungs are well-aerated.  Mild vascular congestion is
noted.  Mild recurrent left basilar atelectasis is suggested.
There is no evidence of focal consolidation, pleural effusion or
pneumothorax.

The cardiomediastinal silhouette is borderline enlarged; the
patient is status post median sternotomy.  No acute osseous
abnormalities are seen.
IMPRESSION: Mild vascular congestion and borderline cardiomegaly, with mild
left basilar atelectasis; no focal airspace consolidation seen.

## 2014-06-15 ENCOUNTER — Encounter (HOSPITAL_COMMUNITY): Payer: Self-pay

## 2014-06-16 ENCOUNTER — Encounter (HOSPITAL_COMMUNITY): Payer: Self-pay

## 2014-06-18 ENCOUNTER — Encounter (HOSPITAL_COMMUNITY): Payer: Self-pay

## 2014-06-22 ENCOUNTER — Encounter (HOSPITAL_COMMUNITY): Payer: Self-pay

## 2014-06-23 ENCOUNTER — Encounter (HOSPITAL_COMMUNITY): Payer: Self-pay

## 2014-06-25 ENCOUNTER — Encounter (HOSPITAL_COMMUNITY): Payer: Self-pay

## 2014-06-29 ENCOUNTER — Encounter (HOSPITAL_COMMUNITY): Payer: Self-pay

## 2014-06-30 ENCOUNTER — Encounter (HOSPITAL_COMMUNITY)
Admission: RE | Admit: 2014-06-30 | Discharge: 2014-06-30 | Disposition: A | Discharge status: Home / Self Care | Payer: Self-pay | Source: Ambulatory Visit | Attending: Cardiovascular Disease | Admitting: Cardiovascular Disease

## 2014-07-02 ENCOUNTER — Encounter (HOSPITAL_COMMUNITY)
Admission: RE | Admit: 2014-07-02 | Discharge: 2014-07-02 | Disposition: A | Discharge status: Home / Self Care | Payer: Self-pay | Source: Ambulatory Visit | Attending: Cardiovascular Disease | Admitting: Cardiovascular Disease

## 2014-07-06 ENCOUNTER — Encounter (HOSPITAL_COMMUNITY): Payer: Self-pay

## 2014-07-07 ENCOUNTER — Encounter (HOSPITAL_COMMUNITY): Payer: Medicare Other

## 2014-07-07 DIAGNOSIS — Z951 Presence of aortocoronary bypass graft: Secondary | ICD-10-CM | POA: Insufficient documentation

## 2014-07-07 DIAGNOSIS — Z5189 Encounter for other specified aftercare: Secondary | ICD-10-CM | POA: Insufficient documentation

## 2014-07-07 DIAGNOSIS — I200 Unstable angina: Secondary | ICD-10-CM | POA: Insufficient documentation

## 2014-07-07 DIAGNOSIS — Z87891 Personal history of nicotine dependence: Secondary | ICD-10-CM | POA: Insufficient documentation

## 2014-07-07 DIAGNOSIS — I2589 Other forms of chronic ischemic heart disease: Secondary | ICD-10-CM | POA: Insufficient documentation

## 2014-07-07 DIAGNOSIS — I5031 Acute diastolic (congestive) heart failure: Secondary | ICD-10-CM | POA: Insufficient documentation

## 2014-07-07 DIAGNOSIS — I219 Acute myocardial infarction, unspecified: Secondary | ICD-10-CM | POA: Insufficient documentation

## 2014-07-07 DIAGNOSIS — I2510 Atherosclerotic heart disease of native coronary artery without angina pectoris: Secondary | ICD-10-CM | POA: Insufficient documentation

## 2014-07-09 ENCOUNTER — Encounter (HOSPITAL_COMMUNITY)
Admission: RE | Admit: 2014-07-09 | Discharge: 2014-07-09 | Disposition: A | Discharge status: Home / Self Care | Payer: Self-pay | Source: Ambulatory Visit | Attending: Cardiovascular Disease | Admitting: Cardiovascular Disease

## 2014-07-14 ENCOUNTER — Encounter (HOSPITAL_COMMUNITY): Payer: Self-pay

## 2014-07-16 ENCOUNTER — Encounter (HOSPITAL_COMMUNITY): Payer: Self-pay

## 2014-07-20 ENCOUNTER — Encounter (HOSPITAL_COMMUNITY): Payer: Self-pay

## 2014-07-21 ENCOUNTER — Encounter (HOSPITAL_COMMUNITY): Payer: Self-pay

## 2014-07-23 ENCOUNTER — Encounter (HOSPITAL_COMMUNITY): Payer: Self-pay

## 2014-07-27 ENCOUNTER — Encounter (HOSPITAL_COMMUNITY)
Admission: RE | Admit: 2014-07-27 | Discharge: 2014-07-27 | Disposition: A | Discharge status: Home / Self Care | Payer: Self-pay | Source: Ambulatory Visit | Attending: Cardiovascular Disease | Admitting: Cardiovascular Disease

## 2014-07-28 ENCOUNTER — Encounter (HOSPITAL_COMMUNITY)
Admission: RE | Admit: 2014-07-28 | Discharge: 2014-07-28 | Disposition: A | Discharge status: Home / Self Care | Payer: Self-pay | Source: Ambulatory Visit | Attending: Cardiovascular Disease | Admitting: Cardiovascular Disease

## 2014-07-30 ENCOUNTER — Encounter (HOSPITAL_COMMUNITY): Payer: Self-pay

## 2014-08-03 ENCOUNTER — Encounter (HOSPITAL_COMMUNITY): Payer: Self-pay

## 2014-08-04 ENCOUNTER — Encounter (HOSPITAL_COMMUNITY): Payer: Self-pay

## 2014-08-05 ENCOUNTER — Telehealth: Payer: Self-pay | Admitting: *Deleted

## 2014-08-05 DIAGNOSIS — Z79899 Other long term (current) drug therapy: Secondary | ICD-10-CM

## 2014-08-05 MED ORDER — CLOPIDOGREL BISULFATE 75 MG PO TABS: 75.0000 mg | ORAL_TABLET | Freq: Every day | ORAL | Status: DC

## 2014-08-05 NOTE — Telephone Encounter (Signed)
RX sent to [facility] at patient's wife's request and 30 day RX given to patient's wife.  Lab slip given for p2y12 test to be done 2 weeks after starting plavix

## 2014-08-05 NOTE — Telephone Encounter (Signed)
Can discontinue Effient , transition to Plavix and check a P2Y12 test

## 2014-08-05 NOTE — Telephone Encounter (Signed)
[facility] is refusing to [facility] for [name]'s effient.  I will review with Dr Gwenlyn Found to see if we can change to Plavix.

## 2014-08-06 ENCOUNTER — Encounter (HOSPITAL_COMMUNITY)
Admission: RE | Admit: 2014-08-06 | Discharge: 2014-08-06 | Disposition: A | Discharge status: Home / Self Care | Payer: Self-pay | Source: Ambulatory Visit | Attending: Cardiovascular Disease | Admitting: Cardiovascular Disease

## 2014-08-06 DIAGNOSIS — I5031 Acute diastolic (congestive) heart failure: Secondary | ICD-10-CM | POA: Insufficient documentation

## 2014-08-06 DIAGNOSIS — I259 Chronic ischemic heart disease, unspecified: Secondary | ICD-10-CM | POA: Insufficient documentation

## 2014-08-06 DIAGNOSIS — I10 Essential (primary) hypertension: Secondary | ICD-10-CM | POA: Insufficient documentation

## 2014-08-06 DIAGNOSIS — Z87891 Personal history of nicotine dependence: Secondary | ICD-10-CM | POA: Insufficient documentation

## 2014-08-06 DIAGNOSIS — I252 Old myocardial infarction: Secondary | ICD-10-CM | POA: Insufficient documentation

## 2014-08-06 DIAGNOSIS — I2510 Atherosclerotic heart disease of native coronary artery without angina pectoris: Secondary | ICD-10-CM | POA: Insufficient documentation

## 2014-08-06 DIAGNOSIS — I200 Unstable angina: Secondary | ICD-10-CM | POA: Insufficient documentation

## 2014-08-06 DIAGNOSIS — Z951 Presence of aortocoronary bypass graft: Secondary | ICD-10-CM | POA: Insufficient documentation

## 2014-08-06 DIAGNOSIS — Z5189 Encounter for other specified aftercare: Secondary | ICD-10-CM | POA: Insufficient documentation

## 2014-08-10 ENCOUNTER — Encounter (HOSPITAL_COMMUNITY): Payer: Self-pay

## 2014-08-11 ENCOUNTER — Encounter (HOSPITAL_COMMUNITY)
Admission: RE | Admit: 2014-08-11 | Discharge: 2014-08-11 | Disposition: A | Discharge status: Home / Self Care | Payer: Self-pay | Source: Ambulatory Visit | Attending: Cardiovascular Disease | Admitting: Cardiovascular Disease

## 2014-08-13 ENCOUNTER — Encounter (HOSPITAL_COMMUNITY): Payer: Self-pay

## 2014-08-14 ENCOUNTER — Encounter: Payer: Self-pay | Admitting: Cardiovascular Disease

## 2014-08-14 ENCOUNTER — Ambulatory Visit (INDEPENDENT_AMBULATORY_CARE_PROVIDER_SITE_OTHER): Payer: Medicare Other | Admitting: Cardiovascular Disease

## 2014-08-14 VITALS — BP 118/60 | HR 58 | Ht 70.0 in | Wt 184.0 lb

## 2014-08-14 DIAGNOSIS — I2510 Atherosclerotic heart disease of native coronary artery without angina pectoris: Secondary | ICD-10-CM

## 2014-08-14 DIAGNOSIS — E785 Hyperlipidemia, unspecified: Secondary | ICD-10-CM | POA: Diagnosis not present

## 2014-08-14 DIAGNOSIS — I10 Essential (primary) hypertension: Secondary | ICD-10-CM

## 2014-08-14 DIAGNOSIS — Z951 Presence of aortocoronary bypass graft: Secondary | ICD-10-CM

## 2014-08-14 NOTE — Patient Instructions (Signed)
We request that you follow-up in: 6 months with Gaspar Bidding and in 1 year with Dr Andria Rhein will receive a reminder letter in the mail two months in advance. If you don't receive a letter, please call our office to schedule the follow-up appointment.

## 2014-08-14 NOTE — Assessment & Plan Note (Signed)
On statin therapy followed by the [facility]

## 2014-08-14 NOTE — Assessment & Plan Note (Signed)
Controlled on current medications 

## 2014-08-14 NOTE — Progress Notes (Signed)
08/14/2014 [name]   Jul 06, 1946  [id number]  Primary Physician PROVIDER NOT IN SYSTEM Primary Cardiologist: [name] [name]   HPI:  The patient is a very pleasant 68 year old mildly overweight married Caucasian male, father of 22 and grandfather to 7 grandchildren, whom I last saw in the office one year ago. He was seen by Tenny Craw PA-C 6 months ago. He has a history of CAD, status post coronary artery bypass grafting by [name] in January of last year after a catheterization performed by me December 02, 2011, revealed left main 3-vessel disease with moderate LV dysfunction. He had a LIMA to his LAD, a vein to an OM branch and to the RCA. His postoperative course was uncomplicated. He did stop smoking at that time. His other problems include hypertension and hyperlipidemia as well as a family history of heart disease. He had recurrent episodes of unstable angina and ultimately underwent recatheterization by [name] in October of last year revealing occluded vein to the circumflex marginal branch with a total left main. He had a patent LIMA to his LAD and a patent vein to the RCA. It was presumed that his angina and non-STEMIs were related to ischemia in the circumflex territory. He ultimately underwent redo bypass surgery x1 to the circumflex obtuse marginal branch in November and returned several times after that with recurrent angina. Catheterization revealed a kink in the vessel just proximal to the distal anastomosis as well as preanastomotic lesion. I ultimately performed stenting of his newly-placed circumflex marginal vein graft on October 31, 2012, which has resulted in complete resolution of his anginal symptoms. He participated in cardiac rehab. Recently he developed recurrent chest pain. A Myoview stress test performed on 05/13/13 was read as "high risk" is remarkable for significant anterolateral and inferolateral ischemia.  I performed  cardiac catheterization on him 05/16/13 revealing high-grade in-stent restenosis" within the circumflex obtuse marginal branch SVG distal stented segment as well as at the aorto/ostium. I we stented the distal portion of the vein graft and stent at the origin as well as drug-eluting stents.he has remained asymptomatic since I saw him in the office one year ago. Unfortunately his wife and compare with cancer and he cares for her at home. This is a tremendous social stress.    Current Outpatient Prescriptions  Medication Sig Dispense Refill  . amLODipine (NORVASC) 5 MG tablet Take 1 tablet (5 mg total) by mouth daily.  30 tablet  5  . aspirin EC 325 MG EC tablet Take 1 tablet (325 mg total) by mouth daily.  30 tablet    . atorvastatin (LIPITOR) 10 MG tablet Take 20 mg by mouth daily.       . carvedilol (COREG) 3.125 MG tablet Take 3.125 mg by mouth 2 (two) times daily with a meal.      . clopidogrel (PLAVIX) 75 MG tablet Take 1 tablet (75 mg total) by mouth daily.  30 tablet  0  . ferrous sulfate 325 (65 FE) MG tablet Take 1 tablet (325 mg total) by mouth 3 (three) times daily with meals.  30 tablet  5  . finasteride (PROSCAR) 5 MG tablet Take 5 mg by mouth daily.      . isosorbide mononitrate (IMDUR) 30 MG 24 hr tablet Take 1 tablet (30 mg total) by mouth daily.  90 tablet  3  . metroNIDAZOLE (FLAGYL) 500 MG tablet Take 500 mg by mouth 2 (two) times  daily.      . nitroGLYCERIN (NITROSTAT) 0.4 MG SL tablet Place 1 tablet (0.4 mg total) under the tongue every 5 (five) minutes x 3 doses as needed for chest pain.  25 tablet  5  . omeprazole (PRILOSEC) 20 MG capsule Take 20 mg by mouth 2 (two) times daily.      Marland Kitchen oxyCODONE-acetaminophen (PERCOCET) 10-325 MG per tablet Take 1 tablet by mouth every 6 (six) hours as needed for pain.  20 tablet  0  . tamsulosin (FLOMAX) 0.4 MG CAPS capsule Take 1 capsule (0.4 mg total) by mouth daily.  7 capsule  0  . terazosin (HYTRIN) 10 MG capsule Take 10 mg by mouth at  bedtime.       No current facility-administered medications for this visit.    No Known Allergies  History   Social History  . Marital Status: Married    Spouse Name: N/A    Number of Children: N/A  . Years of Education: N/A   Occupational History  . Not on file.   Social History Main Topics  . Smoking status: Former Smoker -- 0.50 packs/day for 50 years    Types: Cigarettes    Quit date: 12/02/2011  . Smokeless tobacco: Never Used  . Alcohol Use: No     Comment: 08/29/2012 "haven't had a drink in > 25 years; never had problem w/it"  . Drug Use: No  . Sexual Activity: Yes   Other Topics Concern  . Not on file   Social History Narrative   [place] [phone]. History of Agent Orange exposure, followed at [place] [facility]: General: negative for chills, fever, night sweats or weight changes.  Cardiovascular: negative for chest pain, dyspnea on exertion, edema, orthopnea, palpitations, paroxysmal nocturnal dyspnea or shortness of breath Dermatological: negative for rash Respiratory: negative for cough or wheezing Urologic: negative for hematuria Abdominal: negative for nausea, vomiting, diarrhea, bright red blood per rectum, melena, or hematemesis Neurologic: negative for visual changes, syncope, or dizziness All other systems reviewed and are otherwise negative except as noted above.    Blood pressure 118/60, pulse 58, height 5\' 10"  (1.778 m), weight 184 lb (83.462 kg), SpO2 99.00%.  General appearance: alert and no distress Neck: no adenopathy, no JVD, supple, symmetrical, trachea midline, thyroid not enlarged, symmetric, no tenderness/mass/nodules and soft right carotid bruit Lungs: clear to auscultation bilaterally Heart: soft outflow tract murmur Extremities: extremities normal, atraumatic, no cyanosis or edema  EKG not performed today  ASSESSMENT AND PLAN:   S/P CABG x 3 11/2011: LIMA TO LAD, SVG TO OM, SVG TO RCA., VG to OM occluded, jeopardized  LCX, by cath 08/29/12 -- Re-Do CABG with SVG-OM (09/2012) History of CAD status post bypass surgery January 2013 with reduced her complex marginal graft placed 10/31/12. I stented the circumflex vein graft anastomosis twice and the origin once most recently July of 2014. He has been asymptomatic since on aspirin and Plavix although his PRU was 215 a on his Verifying Now test year ago. Unfortunately, at the [facility] in [place] would not allow him to take Effient. He has done remarkably well since and denies chest pain or shortness of breath.  HTN (hypertension) Controlled on current medications  Dyslipidemia On statin therapy followed by the [place] MD [facility], [facility] 08/14/2014 9:12 AM

## 2014-08-14 NOTE — Assessment & Plan Note (Signed)
History of CAD status post bypass surgery January 2013 with reduced her complex marginal graft placed 10/31/12. I stented the circumflex vein graft anastomosis twice and the origin once most recently July of 2014. He has been asymptomatic since on aspirin and Plavix although his PRU was 215 a on his Verifying Now test year ago. Unfortunately, at the [facility] in [place] would not allow him to take Effient. He has done remarkably well since and denies chest pain or shortness of breath.

## 2014-08-17 ENCOUNTER — Encounter (HOSPITAL_COMMUNITY): Payer: Self-pay

## 2014-08-18 ENCOUNTER — Encounter (HOSPITAL_COMMUNITY): Payer: Self-pay

## 2014-08-20 ENCOUNTER — Encounter (HOSPITAL_COMMUNITY): Payer: Self-pay

## 2014-08-24 ENCOUNTER — Encounter (HOSPITAL_COMMUNITY): Payer: Self-pay

## 2014-08-25 ENCOUNTER — Encounter (HOSPITAL_COMMUNITY)
Admission: RE | Admit: 2014-08-25 | Discharge: 2014-08-25 | Disposition: A | Discharge status: Home / Self Care | Payer: Self-pay | Source: Ambulatory Visit | Attending: Cardiovascular Disease | Admitting: Cardiovascular Disease

## 2014-08-27 ENCOUNTER — Encounter (HOSPITAL_COMMUNITY)
Admission: RE | Admit: 2014-08-27 | Discharge: 2014-08-27 | Disposition: A | Discharge status: Home / Self Care | Payer: Self-pay | Source: Ambulatory Visit | Attending: Cardiovascular Disease | Admitting: Cardiovascular Disease

## 2014-08-31 ENCOUNTER — Encounter (HOSPITAL_COMMUNITY): Payer: Self-pay

## 2014-09-01 ENCOUNTER — Encounter (HOSPITAL_COMMUNITY): Payer: Self-pay

## 2014-09-03 ENCOUNTER — Encounter (HOSPITAL_COMMUNITY): Payer: Self-pay

## 2014-09-07 ENCOUNTER — Ambulatory Visit (HOSPITAL_COMMUNITY)
Admission: AD | Admit: 2014-09-07 | Discharge: 2014-09-07 | Disposition: A | Discharge status: Home / Self Care | Payer: Medicare Other | Source: Ambulatory Visit | Attending: Cardiovascular Disease | Admitting: Cardiovascular Disease

## 2014-09-07 ENCOUNTER — Encounter (HOSPITAL_COMMUNITY): Payer: Self-pay | Attending: Cardiovascular Disease

## 2014-09-07 DIAGNOSIS — I10 Essential (primary) hypertension: Secondary | ICD-10-CM | POA: Insufficient documentation

## 2014-09-07 DIAGNOSIS — Z87891 Personal history of nicotine dependence: Secondary | ICD-10-CM | POA: Insufficient documentation

## 2014-09-07 DIAGNOSIS — I5031 Acute diastolic (congestive) heart failure: Secondary | ICD-10-CM | POA: Insufficient documentation

## 2014-09-07 DIAGNOSIS — I200 Unstable angina: Secondary | ICD-10-CM | POA: Insufficient documentation

## 2014-09-07 DIAGNOSIS — Z5189 Encounter for other specified aftercare: Secondary | ICD-10-CM | POA: Insufficient documentation

## 2014-09-07 DIAGNOSIS — I252 Old myocardial infarction: Secondary | ICD-10-CM | POA: Insufficient documentation

## 2014-09-07 DIAGNOSIS — Z951 Presence of aortocoronary bypass graft: Secondary | ICD-10-CM | POA: Insufficient documentation

## 2014-09-07 DIAGNOSIS — I259 Chronic ischemic heart disease, unspecified: Secondary | ICD-10-CM | POA: Insufficient documentation

## 2014-09-07 LAB — PLATELET INHIBITION P2Y12: PLATELET FUNCTION P2Y12: 191 [PRU] — AB (ref 194–418)

## 2014-09-08 ENCOUNTER — Encounter (HOSPITAL_COMMUNITY): Payer: Self-pay

## 2014-09-08 ENCOUNTER — Encounter: Payer: Self-pay | Admitting: *Deleted

## 2014-09-10 ENCOUNTER — Encounter (HOSPITAL_COMMUNITY): Payer: Self-pay

## 2014-09-14 ENCOUNTER — Encounter (HOSPITAL_COMMUNITY): Payer: Self-pay

## 2014-09-15 ENCOUNTER — Encounter (HOSPITAL_COMMUNITY): Payer: Self-pay

## 2014-09-17 ENCOUNTER — Encounter (HOSPITAL_COMMUNITY): Payer: Self-pay

## 2014-09-21 ENCOUNTER — Encounter (HOSPITAL_COMMUNITY): Payer: Self-pay

## 2014-09-22 ENCOUNTER — Encounter (HOSPITAL_COMMUNITY): Payer: Self-pay

## 2014-09-24 ENCOUNTER — Encounter (HOSPITAL_COMMUNITY): Payer: Self-pay

## 2014-09-28 ENCOUNTER — Encounter (HOSPITAL_COMMUNITY): Payer: Self-pay

## 2014-09-29 ENCOUNTER — Encounter (HOSPITAL_COMMUNITY): Payer: Self-pay

## 2014-10-05 ENCOUNTER — Encounter (HOSPITAL_COMMUNITY): Payer: Self-pay

## 2014-10-06 ENCOUNTER — Encounter (HOSPITAL_COMMUNITY): Payer: Medicare Other

## 2014-10-06 DIAGNOSIS — I259 Chronic ischemic heart disease, unspecified: Secondary | ICD-10-CM | POA: Insufficient documentation

## 2014-10-06 DIAGNOSIS — Z951 Presence of aortocoronary bypass graft: Secondary | ICD-10-CM | POA: Insufficient documentation

## 2014-10-06 DIAGNOSIS — I200 Unstable angina: Secondary | ICD-10-CM | POA: Insufficient documentation

## 2014-10-06 DIAGNOSIS — Z5189 Encounter for other specified aftercare: Secondary | ICD-10-CM | POA: Insufficient documentation

## 2014-10-06 DIAGNOSIS — I5031 Acute diastolic (congestive) heart failure: Secondary | ICD-10-CM | POA: Insufficient documentation

## 2014-10-06 DIAGNOSIS — I10 Essential (primary) hypertension: Secondary | ICD-10-CM | POA: Insufficient documentation

## 2014-10-06 DIAGNOSIS — I252 Old myocardial infarction: Secondary | ICD-10-CM | POA: Insufficient documentation

## 2014-10-06 DIAGNOSIS — Z87891 Personal history of nicotine dependence: Secondary | ICD-10-CM | POA: Insufficient documentation

## 2014-10-08 ENCOUNTER — Encounter (HOSPITAL_COMMUNITY): Admission: RE | Admit: 2014-10-08 | Payer: Self-pay | Source: Ambulatory Visit

## 2014-10-12 ENCOUNTER — Encounter (HOSPITAL_COMMUNITY): Payer: Self-pay

## 2014-10-13 ENCOUNTER — Encounter (HOSPITAL_COMMUNITY)
Admission: RE | Admit: 2014-10-13 | Discharge: 2014-10-13 | Disposition: A | Discharge status: Home / Self Care | Payer: Self-pay | Source: Ambulatory Visit | Attending: Cardiovascular Disease | Admitting: Cardiovascular Disease

## 2014-10-15 ENCOUNTER — Encounter (HOSPITAL_COMMUNITY): Payer: Self-pay

## 2014-10-15 ENCOUNTER — Encounter (HOSPITAL_COMMUNITY): Payer: Self-pay | Admitting: Cardiovascular Disease

## 2014-10-19 ENCOUNTER — Encounter (HOSPITAL_COMMUNITY): Payer: Self-pay

## 2014-10-20 ENCOUNTER — Encounter (HOSPITAL_COMMUNITY)
Admission: RE | Admit: 2014-10-20 | Discharge: 2014-10-20 | Disposition: A | Discharge status: Home / Self Care | Payer: Self-pay | Source: Ambulatory Visit | Attending: Cardiovascular Disease | Admitting: Cardiovascular Disease

## 2014-10-20 ENCOUNTER — Telehealth: Payer: Self-pay | Admitting: *Deleted

## 2014-10-20 NOTE — Telephone Encounter (Signed)
[name] from [facility] needs your confirmation for the patient to continue Cardiac Rehab.

## 2014-10-21 NOTE — Telephone Encounter (Signed)
Form signed and faxed back to Cardiac Rehab. 

## 2014-10-22 ENCOUNTER — Encounter (HOSPITAL_COMMUNITY): Payer: Self-pay

## 2014-10-26 ENCOUNTER — Encounter (HOSPITAL_COMMUNITY): Payer: Self-pay

## 2014-10-27 ENCOUNTER — Encounter (HOSPITAL_COMMUNITY): Payer: Self-pay

## 2014-10-29 ENCOUNTER — Encounter (HOSPITAL_COMMUNITY): Payer: Self-pay

## 2014-11-02 ENCOUNTER — Encounter (HOSPITAL_COMMUNITY): Payer: Self-pay

## 2014-11-03 ENCOUNTER — Encounter (HOSPITAL_COMMUNITY): Payer: Self-pay

## 2014-11-05 ENCOUNTER — Encounter (HOSPITAL_COMMUNITY): Payer: Self-pay

## 2014-11-24 ENCOUNTER — Encounter (HOSPITAL_COMMUNITY): Payer: Self-pay | Attending: Cardiovascular Disease

## 2014-11-24 DIAGNOSIS — I10 Essential (primary) hypertension: Secondary | ICD-10-CM | POA: Insufficient documentation

## 2014-11-24 DIAGNOSIS — I5031 Acute diastolic (congestive) heart failure: Secondary | ICD-10-CM | POA: Insufficient documentation

## 2014-11-24 DIAGNOSIS — Z5189 Encounter for other specified aftercare: Secondary | ICD-10-CM | POA: Insufficient documentation

## 2014-11-24 DIAGNOSIS — I252 Old myocardial infarction: Secondary | ICD-10-CM | POA: Insufficient documentation

## 2014-11-24 DIAGNOSIS — I200 Unstable angina: Secondary | ICD-10-CM | POA: Insufficient documentation

## 2014-11-24 DIAGNOSIS — Z87891 Personal history of nicotine dependence: Secondary | ICD-10-CM | POA: Insufficient documentation

## 2014-11-24 DIAGNOSIS — Z951 Presence of aortocoronary bypass graft: Secondary | ICD-10-CM | POA: Insufficient documentation

## 2014-11-24 DIAGNOSIS — I259 Chronic ischemic heart disease, unspecified: Secondary | ICD-10-CM | POA: Insufficient documentation

## 2014-11-26 ENCOUNTER — Encounter (HOSPITAL_COMMUNITY): Payer: Self-pay

## 2014-11-30 ENCOUNTER — Encounter (HOSPITAL_COMMUNITY): Payer: Self-pay

## 2014-12-01 ENCOUNTER — Encounter (HOSPITAL_COMMUNITY): Payer: Self-pay

## 2014-12-03 ENCOUNTER — Encounter (HOSPITAL_COMMUNITY): Payer: Self-pay

## 2014-12-07 ENCOUNTER — Encounter (HOSPITAL_COMMUNITY): Payer: Medicare Other

## 2014-12-07 DIAGNOSIS — Z5189 Encounter for other specified aftercare: Secondary | ICD-10-CM | POA: Insufficient documentation

## 2014-12-07 DIAGNOSIS — I5031 Acute diastolic (congestive) heart failure: Secondary | ICD-10-CM | POA: Insufficient documentation

## 2014-12-07 DIAGNOSIS — I252 Old myocardial infarction: Secondary | ICD-10-CM | POA: Insufficient documentation

## 2014-12-07 DIAGNOSIS — Z951 Presence of aortocoronary bypass graft: Secondary | ICD-10-CM | POA: Insufficient documentation

## 2014-12-07 DIAGNOSIS — Z87891 Personal history of nicotine dependence: Secondary | ICD-10-CM | POA: Insufficient documentation

## 2014-12-07 DIAGNOSIS — I200 Unstable angina: Secondary | ICD-10-CM | POA: Insufficient documentation

## 2014-12-07 DIAGNOSIS — I10 Essential (primary) hypertension: Secondary | ICD-10-CM | POA: Insufficient documentation

## 2014-12-07 DIAGNOSIS — I259 Chronic ischemic heart disease, unspecified: Secondary | ICD-10-CM | POA: Insufficient documentation

## 2014-12-08 ENCOUNTER — Encounter (HOSPITAL_COMMUNITY): Payer: Self-pay

## 2014-12-10 ENCOUNTER — Encounter (HOSPITAL_COMMUNITY): Payer: Self-pay

## 2014-12-14 ENCOUNTER — Encounter (HOSPITAL_COMMUNITY): Payer: Self-pay

## 2014-12-15 ENCOUNTER — Encounter (HOSPITAL_COMMUNITY): Payer: Self-pay

## 2014-12-17 ENCOUNTER — Encounter (HOSPITAL_COMMUNITY): Payer: Self-pay

## 2014-12-21 ENCOUNTER — Encounter (HOSPITAL_COMMUNITY): Payer: Self-pay

## 2014-12-22 ENCOUNTER — Encounter (HOSPITAL_COMMUNITY): Payer: Self-pay

## 2014-12-24 ENCOUNTER — Encounter (HOSPITAL_COMMUNITY): Payer: Self-pay

## 2014-12-28 ENCOUNTER — Encounter (HOSPITAL_COMMUNITY): Payer: Self-pay

## 2014-12-29 ENCOUNTER — Encounter (HOSPITAL_COMMUNITY)
Admission: RE | Admit: 2014-12-29 | Discharge: 2014-12-29 | Disposition: A | Discharge status: Home / Self Care | Payer: Self-pay | Source: Ambulatory Visit | Attending: Cardiovascular Disease | Admitting: Cardiovascular Disease

## 2014-12-31 ENCOUNTER — Encounter (HOSPITAL_COMMUNITY)
Admission: RE | Admit: 2014-12-31 | Discharge: 2014-12-31 | Disposition: A | Discharge status: Home / Self Care | Payer: Self-pay | Source: Ambulatory Visit | Attending: Cardiovascular Disease | Admitting: Cardiovascular Disease

## 2015-01-04 ENCOUNTER — Encounter (HOSPITAL_COMMUNITY): Payer: Self-pay

## 2015-01-05 ENCOUNTER — Encounter (HOSPITAL_COMMUNITY): Payer: Medicare Other

## 2015-01-05 DIAGNOSIS — I10 Essential (primary) hypertension: Secondary | ICD-10-CM | POA: Insufficient documentation

## 2015-01-05 DIAGNOSIS — I200 Unstable angina: Secondary | ICD-10-CM | POA: Insufficient documentation

## 2015-01-05 DIAGNOSIS — Z951 Presence of aortocoronary bypass graft: Secondary | ICD-10-CM | POA: Insufficient documentation

## 2015-01-05 DIAGNOSIS — Z5189 Encounter for other specified aftercare: Secondary | ICD-10-CM | POA: Insufficient documentation

## 2015-01-05 DIAGNOSIS — I259 Chronic ischemic heart disease, unspecified: Secondary | ICD-10-CM | POA: Insufficient documentation

## 2015-01-05 DIAGNOSIS — I252 Old myocardial infarction: Secondary | ICD-10-CM | POA: Insufficient documentation

## 2015-01-05 DIAGNOSIS — I5031 Acute diastolic (congestive) heart failure: Secondary | ICD-10-CM | POA: Insufficient documentation

## 2015-01-05 DIAGNOSIS — Z87891 Personal history of nicotine dependence: Secondary | ICD-10-CM | POA: Insufficient documentation

## 2015-01-07 ENCOUNTER — Encounter (HOSPITAL_COMMUNITY)
Admission: RE | Admit: 2015-01-07 | Discharge: 2015-01-07 | Disposition: A | Discharge status: Home / Self Care | Payer: Self-pay | Source: Ambulatory Visit | Attending: Cardiovascular Disease | Admitting: Cardiovascular Disease

## 2015-01-11 ENCOUNTER — Encounter (HOSPITAL_COMMUNITY): Payer: Self-pay

## 2015-01-12 ENCOUNTER — Encounter (HOSPITAL_COMMUNITY)
Admission: RE | Admit: 2015-01-12 | Discharge: 2015-01-12 | Disposition: A | Discharge status: Home / Self Care | Payer: Self-pay | Source: Ambulatory Visit | Attending: Cardiovascular Disease | Admitting: Cardiovascular Disease

## 2015-01-14 ENCOUNTER — Encounter (HOSPITAL_COMMUNITY)
Admission: RE | Admit: 2015-01-14 | Discharge: 2015-01-14 | Disposition: A | Discharge status: Home / Self Care | Payer: Self-pay | Source: Ambulatory Visit | Attending: Cardiovascular Disease | Admitting: Cardiovascular Disease

## 2015-01-18 ENCOUNTER — Encounter (HOSPITAL_COMMUNITY): Payer: Self-pay

## 2015-01-19 ENCOUNTER — Encounter (HOSPITAL_COMMUNITY): Payer: Self-pay

## 2015-01-21 ENCOUNTER — Encounter (HOSPITAL_COMMUNITY)
Admission: RE | Admit: 2015-01-21 | Discharge: 2015-01-21 | Disposition: A | Discharge status: Home / Self Care | Payer: Self-pay | Source: Ambulatory Visit | Attending: Cardiovascular Disease | Admitting: Cardiovascular Disease

## 2015-01-25 ENCOUNTER — Encounter (HOSPITAL_COMMUNITY): Payer: Self-pay

## 2015-01-26 ENCOUNTER — Encounter (HOSPITAL_COMMUNITY)
Admission: RE | Admit: 2015-01-26 | Discharge: 2015-01-26 | Disposition: A | Discharge status: Home / Self Care | Payer: Self-pay | Source: Ambulatory Visit | Attending: Cardiovascular Disease | Admitting: Cardiovascular Disease

## 2015-01-28 ENCOUNTER — Encounter (HOSPITAL_COMMUNITY): Payer: Self-pay

## 2015-02-01 ENCOUNTER — Encounter (HOSPITAL_COMMUNITY): Payer: Self-pay

## 2015-02-02 ENCOUNTER — Encounter (HOSPITAL_COMMUNITY): Payer: Self-pay

## 2015-02-04 ENCOUNTER — Encounter (HOSPITAL_COMMUNITY): Payer: Self-pay

## 2015-02-08 ENCOUNTER — Encounter (HOSPITAL_COMMUNITY): Payer: Medicare Other

## 2015-02-08 DIAGNOSIS — I5031 Acute diastolic (congestive) heart failure: Secondary | ICD-10-CM | POA: Insufficient documentation

## 2015-02-08 DIAGNOSIS — I252 Old myocardial infarction: Secondary | ICD-10-CM | POA: Insufficient documentation

## 2015-02-08 DIAGNOSIS — Z5189 Encounter for other specified aftercare: Secondary | ICD-10-CM | POA: Insufficient documentation

## 2015-02-08 DIAGNOSIS — I10 Essential (primary) hypertension: Secondary | ICD-10-CM | POA: Insufficient documentation

## 2015-02-08 DIAGNOSIS — Z87891 Personal history of nicotine dependence: Secondary | ICD-10-CM | POA: Insufficient documentation

## 2015-02-08 DIAGNOSIS — I259 Chronic ischemic heart disease, unspecified: Secondary | ICD-10-CM | POA: Insufficient documentation

## 2015-02-08 DIAGNOSIS — Z951 Presence of aortocoronary bypass graft: Secondary | ICD-10-CM | POA: Insufficient documentation

## 2015-02-08 DIAGNOSIS — I200 Unstable angina: Secondary | ICD-10-CM | POA: Insufficient documentation

## 2015-02-09 ENCOUNTER — Encounter (HOSPITAL_COMMUNITY): Payer: Self-pay

## 2015-02-11 ENCOUNTER — Encounter (HOSPITAL_COMMUNITY)
Admission: RE | Admit: 2015-02-11 | Discharge: 2015-02-11 | Disposition: A | Discharge status: Home / Self Care | Payer: Self-pay | Source: Ambulatory Visit | Attending: Cardiovascular Disease | Admitting: Cardiovascular Disease

## 2015-02-15 ENCOUNTER — Encounter (HOSPITAL_COMMUNITY): Payer: Self-pay

## 2015-02-16 ENCOUNTER — Encounter (HOSPITAL_COMMUNITY): Payer: Self-pay

## 2015-02-18 ENCOUNTER — Encounter (HOSPITAL_COMMUNITY)
Admission: RE | Admit: 2015-02-18 | Discharge: 2015-02-18 | Disposition: A | Discharge status: Home / Self Care | Payer: Self-pay | Source: Ambulatory Visit | Attending: Cardiovascular Disease | Admitting: Cardiovascular Disease

## 2015-02-22 ENCOUNTER — Encounter (HOSPITAL_COMMUNITY): Payer: Self-pay

## 2015-02-23 ENCOUNTER — Encounter (HOSPITAL_COMMUNITY): Payer: Self-pay

## 2015-02-25 ENCOUNTER — Encounter (HOSPITAL_COMMUNITY)
Admission: RE | Admit: 2015-02-25 | Discharge: 2015-02-25 | Disposition: A | Discharge status: Home / Self Care | Payer: Self-pay | Source: Ambulatory Visit | Attending: Cardiovascular Disease | Admitting: Cardiovascular Disease

## 2015-03-01 ENCOUNTER — Encounter (HOSPITAL_COMMUNITY): Payer: Self-pay

## 2015-03-02 ENCOUNTER — Encounter (HOSPITAL_COMMUNITY): Payer: Self-pay

## 2015-03-04 ENCOUNTER — Encounter (HOSPITAL_COMMUNITY)
Admission: RE | Admit: 2015-03-04 | Discharge: 2015-03-04 | Disposition: A | Discharge status: Home / Self Care | Payer: Self-pay | Source: Ambulatory Visit | Attending: Cardiovascular Disease | Admitting: Cardiovascular Disease

## 2015-03-08 ENCOUNTER — Encounter (HOSPITAL_COMMUNITY): Payer: Self-pay

## 2015-03-08 DIAGNOSIS — I252 Old myocardial infarction: Secondary | ICD-10-CM | POA: Insufficient documentation

## 2015-03-08 DIAGNOSIS — Z5189 Encounter for other specified aftercare: Secondary | ICD-10-CM | POA: Insufficient documentation

## 2015-03-08 DIAGNOSIS — Z87891 Personal history of nicotine dependence: Secondary | ICD-10-CM | POA: Insufficient documentation

## 2015-03-08 DIAGNOSIS — I10 Essential (primary) hypertension: Secondary | ICD-10-CM | POA: Insufficient documentation

## 2015-03-08 DIAGNOSIS — Z951 Presence of aortocoronary bypass graft: Secondary | ICD-10-CM | POA: Insufficient documentation

## 2015-03-08 DIAGNOSIS — I5031 Acute diastolic (congestive) heart failure: Secondary | ICD-10-CM | POA: Insufficient documentation

## 2015-03-08 DIAGNOSIS — I200 Unstable angina: Secondary | ICD-10-CM | POA: Insufficient documentation

## 2015-03-08 DIAGNOSIS — I259 Chronic ischemic heart disease, unspecified: Secondary | ICD-10-CM | POA: Insufficient documentation

## 2015-03-09 ENCOUNTER — Encounter (HOSPITAL_COMMUNITY): Payer: Self-pay

## 2015-03-11 ENCOUNTER — Encounter (HOSPITAL_COMMUNITY)
Admission: RE | Admit: 2015-03-11 | Discharge: 2015-03-11 | Disposition: A | Discharge status: Home / Self Care | Payer: Self-pay | Source: Ambulatory Visit | Attending: Cardiovascular Disease | Admitting: Cardiovascular Disease

## 2015-03-15 ENCOUNTER — Encounter (HOSPITAL_COMMUNITY): Payer: Self-pay

## 2015-03-16 ENCOUNTER — Encounter (HOSPITAL_COMMUNITY): Payer: Self-pay

## 2015-03-18 ENCOUNTER — Encounter (HOSPITAL_COMMUNITY)
Admission: RE | Admit: 2015-03-18 | Discharge: 2015-03-18 | Disposition: A | Discharge status: Home / Self Care | Payer: Self-pay | Source: Ambulatory Visit | Attending: Cardiovascular Disease | Admitting: Cardiovascular Disease

## 2015-03-22 ENCOUNTER — Encounter (HOSPITAL_COMMUNITY): Payer: Self-pay

## 2015-03-23 ENCOUNTER — Encounter (HOSPITAL_COMMUNITY): Payer: Self-pay

## 2015-03-25 ENCOUNTER — Encounter (HOSPITAL_COMMUNITY): Payer: Self-pay

## 2015-03-29 ENCOUNTER — Encounter (HOSPITAL_COMMUNITY): Payer: Self-pay

## 2015-03-30 ENCOUNTER — Encounter (HOSPITAL_COMMUNITY): Payer: Self-pay

## 2015-04-01 ENCOUNTER — Encounter (HOSPITAL_COMMUNITY): Payer: Self-pay

## 2015-04-06 ENCOUNTER — Encounter (HOSPITAL_COMMUNITY): Payer: Self-pay

## 2015-04-08 ENCOUNTER — Encounter (HOSPITAL_COMMUNITY): Payer: Medicare Other

## 2015-04-08 DIAGNOSIS — I252 Old myocardial infarction: Secondary | ICD-10-CM | POA: Insufficient documentation

## 2015-04-08 DIAGNOSIS — I259 Chronic ischemic heart disease, unspecified: Secondary | ICD-10-CM | POA: Insufficient documentation

## 2015-04-08 DIAGNOSIS — Z87891 Personal history of nicotine dependence: Secondary | ICD-10-CM | POA: Insufficient documentation

## 2015-04-08 DIAGNOSIS — I200 Unstable angina: Secondary | ICD-10-CM | POA: Insufficient documentation

## 2015-04-08 DIAGNOSIS — I10 Essential (primary) hypertension: Secondary | ICD-10-CM | POA: Insufficient documentation

## 2015-04-08 DIAGNOSIS — Z951 Presence of aortocoronary bypass graft: Secondary | ICD-10-CM | POA: Insufficient documentation

## 2015-04-08 DIAGNOSIS — Z5189 Encounter for other specified aftercare: Secondary | ICD-10-CM | POA: Insufficient documentation

## 2015-04-08 DIAGNOSIS — I5031 Acute diastolic (congestive) heart failure: Secondary | ICD-10-CM | POA: Insufficient documentation

## 2015-04-12 ENCOUNTER — Encounter (HOSPITAL_COMMUNITY): Payer: Self-pay

## 2015-04-13 ENCOUNTER — Encounter (HOSPITAL_COMMUNITY)
Admission: RE | Admit: 2015-04-13 | Discharge: 2015-04-13 | Disposition: A | Discharge status: Home / Self Care | Payer: Self-pay | Source: Ambulatory Visit | Attending: Cardiovascular Disease | Admitting: Cardiovascular Disease

## 2015-04-15 ENCOUNTER — Encounter (HOSPITAL_COMMUNITY): Payer: Self-pay

## 2015-04-19 ENCOUNTER — Encounter (HOSPITAL_COMMUNITY): Payer: Self-pay

## 2015-04-20 ENCOUNTER — Encounter (HOSPITAL_COMMUNITY): Payer: Self-pay

## 2015-04-22 ENCOUNTER — Encounter (HOSPITAL_COMMUNITY): Payer: Self-pay

## 2015-04-26 ENCOUNTER — Encounter (HOSPITAL_COMMUNITY): Payer: Self-pay

## 2015-04-27 ENCOUNTER — Encounter (HOSPITAL_COMMUNITY)
Admission: RE | Admit: 2015-04-27 | Discharge: 2015-04-27 | Disposition: A | Discharge status: Home / Self Care | Payer: Self-pay | Source: Ambulatory Visit | Attending: Cardiovascular Disease | Admitting: Cardiovascular Disease

## 2015-04-29 ENCOUNTER — Encounter (HOSPITAL_COMMUNITY)
Admission: RE | Admit: 2015-04-29 | Discharge: 2015-04-29 | Disposition: A | Discharge status: Home / Self Care | Payer: Self-pay | Source: Ambulatory Visit | Attending: Cardiovascular Disease | Admitting: Cardiovascular Disease

## 2015-05-03 ENCOUNTER — Encounter (HOSPITAL_COMMUNITY): Admission: RE | Admit: 2015-05-03 | Payer: Medicare Other | Source: Ambulatory Visit

## 2015-05-04 ENCOUNTER — Encounter (HOSPITAL_COMMUNITY): Payer: Self-pay

## 2015-05-06 ENCOUNTER — Encounter (HOSPITAL_COMMUNITY): Payer: Self-pay

## 2015-05-11 ENCOUNTER — Encounter (HOSPITAL_COMMUNITY): Payer: Self-pay

## 2015-05-11 DIAGNOSIS — Z951 Presence of aortocoronary bypass graft: Secondary | ICD-10-CM | POA: Insufficient documentation

## 2015-05-11 DIAGNOSIS — Z955 Presence of coronary angioplasty implant and graft: Secondary | ICD-10-CM | POA: Insufficient documentation

## 2015-05-11 DIAGNOSIS — I252 Old myocardial infarction: Secondary | ICD-10-CM | POA: Insufficient documentation

## 2015-05-11 DIAGNOSIS — Z48812 Encounter for surgical aftercare following surgery on the circulatory system: Secondary | ICD-10-CM | POA: Insufficient documentation

## 2015-05-13 ENCOUNTER — Encounter (HOSPITAL_COMMUNITY): Payer: Self-pay

## 2015-05-17 ENCOUNTER — Encounter (HOSPITAL_COMMUNITY): Payer: Self-pay

## 2015-05-18 ENCOUNTER — Encounter (HOSPITAL_COMMUNITY)
Admission: RE | Admit: 2015-05-18 | Discharge: 2015-05-18 | Disposition: A | Discharge status: Home / Self Care | Payer: Self-pay | Source: Ambulatory Visit | Attending: Cardiovascular Disease | Admitting: Cardiovascular Disease

## 2015-05-20 ENCOUNTER — Encounter (HOSPITAL_COMMUNITY)
Admission: RE | Admit: 2015-05-20 | Discharge: 2015-05-20 | Disposition: A | Discharge status: Home / Self Care | Payer: Self-pay | Source: Ambulatory Visit | Attending: Cardiovascular Disease | Admitting: Cardiovascular Disease

## 2015-05-24 ENCOUNTER — Encounter (HOSPITAL_COMMUNITY): Payer: Self-pay

## 2015-05-25 ENCOUNTER — Encounter (HOSPITAL_COMMUNITY): Payer: Self-pay

## 2015-05-27 ENCOUNTER — Encounter (HOSPITAL_COMMUNITY): Payer: Self-pay

## 2015-05-31 ENCOUNTER — Encounter (HOSPITAL_COMMUNITY): Payer: Self-pay

## 2015-06-01 ENCOUNTER — Encounter (HOSPITAL_COMMUNITY): Payer: Self-pay

## 2015-06-03 ENCOUNTER — Encounter (HOSPITAL_COMMUNITY): Payer: Self-pay

## 2015-06-07 ENCOUNTER — Encounter (HOSPITAL_COMMUNITY): Payer: Self-pay

## 2015-06-07 DIAGNOSIS — Z951 Presence of aortocoronary bypass graft: Secondary | ICD-10-CM | POA: Insufficient documentation

## 2015-06-07 DIAGNOSIS — Z955 Presence of coronary angioplasty implant and graft: Secondary | ICD-10-CM | POA: Insufficient documentation

## 2015-06-07 DIAGNOSIS — Z48812 Encounter for surgical aftercare following surgery on the circulatory system: Secondary | ICD-10-CM | POA: Insufficient documentation

## 2015-06-07 DIAGNOSIS — I252 Old myocardial infarction: Secondary | ICD-10-CM | POA: Insufficient documentation

## 2015-06-08 ENCOUNTER — Encounter (HOSPITAL_COMMUNITY)
Admission: RE | Admit: 2015-06-08 | Discharge: 2015-06-08 | Disposition: A | Discharge status: Home / Self Care | Payer: Self-pay | Source: Ambulatory Visit | Attending: Cardiovascular Disease | Admitting: Cardiovascular Disease

## 2015-06-10 ENCOUNTER — Encounter (HOSPITAL_COMMUNITY): Payer: Self-pay

## 2015-06-14 ENCOUNTER — Encounter (HOSPITAL_COMMUNITY): Payer: Self-pay

## 2015-06-15 ENCOUNTER — Encounter (HOSPITAL_COMMUNITY)
Admission: RE | Admit: 2015-06-15 | Discharge: 2015-06-15 | Disposition: A | Discharge status: Home / Self Care | Payer: Self-pay | Source: Ambulatory Visit | Attending: Cardiovascular Disease | Admitting: Cardiovascular Disease

## 2015-06-17 ENCOUNTER — Encounter (HOSPITAL_COMMUNITY): Payer: Self-pay

## 2015-06-21 ENCOUNTER — Encounter (HOSPITAL_COMMUNITY): Payer: Self-pay

## 2015-06-22 ENCOUNTER — Encounter (HOSPITAL_COMMUNITY)
Admission: RE | Admit: 2015-06-22 | Discharge: 2015-06-22 | Disposition: A | Discharge status: Home / Self Care | Payer: Self-pay | Source: Ambulatory Visit | Attending: Cardiovascular Disease | Admitting: Cardiovascular Disease

## 2015-06-24 ENCOUNTER — Encounter (HOSPITAL_COMMUNITY): Payer: Self-pay

## 2015-06-28 ENCOUNTER — Encounter (HOSPITAL_COMMUNITY): Payer: Self-pay

## 2015-06-29 ENCOUNTER — Encounter (HOSPITAL_COMMUNITY): Payer: Self-pay

## 2015-07-01 ENCOUNTER — Encounter (HOSPITAL_COMMUNITY): Payer: Self-pay

## 2015-07-05 ENCOUNTER — Encounter (HOSPITAL_COMMUNITY): Payer: Self-pay

## 2015-07-06 ENCOUNTER — Encounter (HOSPITAL_COMMUNITY): Payer: Self-pay

## 2015-07-08 ENCOUNTER — Encounter (HOSPITAL_COMMUNITY): Payer: Medicare Other

## 2015-07-08 DIAGNOSIS — Z955 Presence of coronary angioplasty implant and graft: Secondary | ICD-10-CM | POA: Insufficient documentation

## 2015-07-08 DIAGNOSIS — Z48812 Encounter for surgical aftercare following surgery on the circulatory system: Secondary | ICD-10-CM | POA: Insufficient documentation

## 2015-07-08 DIAGNOSIS — I252 Old myocardial infarction: Secondary | ICD-10-CM | POA: Insufficient documentation

## 2015-07-08 DIAGNOSIS — Z951 Presence of aortocoronary bypass graft: Secondary | ICD-10-CM | POA: Insufficient documentation

## 2015-07-13 ENCOUNTER — Encounter (HOSPITAL_COMMUNITY): Payer: Self-pay

## 2015-07-15 ENCOUNTER — Encounter (HOSPITAL_COMMUNITY)
Admission: RE | Admit: 2015-07-15 | Discharge: 2015-07-15 | Disposition: A | Discharge status: Home / Self Care | Payer: Self-pay | Source: Ambulatory Visit | Attending: Cardiovascular Disease | Admitting: Cardiovascular Disease

## 2015-07-19 ENCOUNTER — Encounter (HOSPITAL_COMMUNITY): Payer: Self-pay

## 2015-07-20 ENCOUNTER — Encounter (HOSPITAL_COMMUNITY): Payer: Self-pay

## 2015-07-22 ENCOUNTER — Encounter (HOSPITAL_COMMUNITY)
Admission: RE | Admit: 2015-07-22 | Discharge: 2015-07-22 | Disposition: A | Discharge status: Home / Self Care | Payer: Medicare Other | Source: Ambulatory Visit | Attending: Cardiovascular Disease | Admitting: Cardiovascular Disease

## 2015-07-26 ENCOUNTER — Encounter (HOSPITAL_COMMUNITY)
Admission: RE | Admit: 2015-07-26 | Discharge: 2015-07-26 | Disposition: A | Discharge status: Home / Self Care | Payer: Medicare Other | Source: Ambulatory Visit | Attending: Cardiovascular Disease | Admitting: Cardiovascular Disease

## 2015-07-27 ENCOUNTER — Encounter (HOSPITAL_COMMUNITY)
Admission: RE | Admit: 2015-07-27 | Discharge: 2015-07-27 | Disposition: A | Discharge status: Home / Self Care | Payer: Self-pay | Source: Ambulatory Visit | Attending: Cardiovascular Disease | Admitting: Cardiovascular Disease

## 2015-07-29 ENCOUNTER — Encounter (HOSPITAL_COMMUNITY): Payer: Self-pay

## 2015-08-02 ENCOUNTER — Encounter (HOSPITAL_COMMUNITY): Payer: Self-pay

## 2015-08-03 ENCOUNTER — Encounter (HOSPITAL_COMMUNITY): Payer: Self-pay

## 2015-08-05 ENCOUNTER — Encounter (HOSPITAL_COMMUNITY): Payer: Self-pay

## 2015-08-09 ENCOUNTER — Encounter (HOSPITAL_COMMUNITY): Payer: Self-pay

## 2015-08-09 DIAGNOSIS — Z955 Presence of coronary angioplasty implant and graft: Secondary | ICD-10-CM | POA: Insufficient documentation

## 2015-08-09 DIAGNOSIS — Z48812 Encounter for surgical aftercare following surgery on the circulatory system: Secondary | ICD-10-CM | POA: Insufficient documentation

## 2015-08-09 DIAGNOSIS — I252 Old myocardial infarction: Secondary | ICD-10-CM | POA: Insufficient documentation

## 2015-08-09 DIAGNOSIS — Z951 Presence of aortocoronary bypass graft: Secondary | ICD-10-CM | POA: Insufficient documentation

## 2015-08-10 ENCOUNTER — Encounter (HOSPITAL_COMMUNITY): Payer: Medicare Other

## 2015-08-12 ENCOUNTER — Encounter (HOSPITAL_COMMUNITY)
Admission: RE | Admit: 2015-08-12 | Discharge: 2015-08-12 | Disposition: A | Discharge status: Home / Self Care | Payer: Self-pay | Source: Ambulatory Visit | Attending: Cardiovascular Disease | Admitting: Cardiovascular Disease

## 2015-08-16 ENCOUNTER — Encounter (HOSPITAL_COMMUNITY): Payer: Self-pay

## 2015-08-17 ENCOUNTER — Encounter (HOSPITAL_COMMUNITY): Payer: Self-pay

## 2015-08-19 ENCOUNTER — Encounter (HOSPITAL_COMMUNITY): Payer: Self-pay

## 2015-08-23 ENCOUNTER — Encounter (HOSPITAL_COMMUNITY): Payer: Self-pay

## 2015-08-24 ENCOUNTER — Encounter (HOSPITAL_COMMUNITY)
Admission: RE | Admit: 2015-08-24 | Discharge: 2015-08-24 | Disposition: A | Discharge status: Home / Self Care | Payer: Self-pay | Source: Ambulatory Visit | Attending: Cardiovascular Disease | Admitting: Cardiovascular Disease

## 2015-08-25 ENCOUNTER — Ambulatory Visit (INDEPENDENT_AMBULATORY_CARE_PROVIDER_SITE_OTHER): Payer: Medicare Other | Admitting: Cardiovascular Disease

## 2015-08-25 ENCOUNTER — Encounter: Payer: Self-pay | Admitting: Cardiovascular Disease

## 2015-08-25 VITALS — BP 138/68 | HR 59 | Ht 70.0 in | Wt 187.0 lb

## 2015-08-25 DIAGNOSIS — E785 Hyperlipidemia, unspecified: Secondary | ICD-10-CM | POA: Diagnosis not present

## 2015-08-25 DIAGNOSIS — I10 Essential (primary) hypertension: Secondary | ICD-10-CM | POA: Diagnosis not present

## 2015-08-25 DIAGNOSIS — Z951 Presence of aortocoronary bypass graft: Secondary | ICD-10-CM

## 2015-08-25 DIAGNOSIS — R0989 Other specified symptoms and signs involving the circulatory and respiratory systems: Secondary | ICD-10-CM | POA: Diagnosis not present

## 2015-08-25 NOTE — Assessment & Plan Note (Signed)
History of dyslipidemia on atorvastatin 10 mg a day followed by the [facility]

## 2015-08-25 NOTE — Assessment & Plan Note (Signed)
History of hypertension with blood pressure measured today at 138/68. He is on amlodipine and carvedilol. Continue current meds at current dosing

## 2015-08-25 NOTE — Progress Notes (Signed)
08/25/2015 [name].   Jun 24, 1946  [id number]  Primary Physician PROVIDER NOT IN SYSTEM Primary Cardiologist: [name] [name]   HPI:  The patient is a very pleasant 69 year old mildly overweight married Caucasian male, father of 35 and grandfather to 7 grandchildren, whom I last saw in the office one year ago.He has a history of CAD, status post coronary artery bypass grafting by Dr. Gilford Green in January of last year after a catheterization performed by me December 02, 2011, revealed left main 3-vessel disease with moderate LV dysfunction. He had a LIMA to his LAD, a vein to an OM branch and to the RCA. His postoperative course was uncomplicated. He did stop smoking at that time. His other problems include hypertension and hyperlipidemia as well as a family history of heart disease. He had recurrent episodes of unstable angina and ultimately underwent recatheterization by Dr. Ellouise Green in October of last year revealing occluded vein to the circumflex marginal branch with a total left main. He had a patent LIMA to his LAD and a patent vein to the RCA. It was presumed that his angina and non-STEMIs were related to ischemia in the circumflex territory. He ultimately underwent redo bypass surgery x1 to the circumflex obtuse marginal branch in November and returned several times after that with recurrent angina. Catheterization revealed a kink in the vessel just proximal to the distal anastomosis as well as preanastomotic lesion. I ultimately performed stenting of his newly-placed circumflex marginal vein graft on October 31, 2012, which has resulted in complete resolution of his anginal symptoms. He participated in cardiac rehab. Recently he developed recurrent chest pain. A Myoview stress test performed on 05/13/13 was read as "high risk" is remarkable for significant anterolateral and inferolateral ischemia.  I performed cardiac catheterization on him 05/16/13  revealing high-grade in-stent restenosis" within the circumflex obtuse marginal branch SVG distal stented segment as well as at the aorto/ostium. I we stented the distal portion of the vein graft and stent at the origin as well as drug-eluting stents.he has remained asymptomatic since I saw him in the office one year ago. Unfortunately his wife a 40 years passed away this past 12/10/22. He is still grieving.   Current Outpatient Prescriptions  Medication Sig Dispense Refill  . amLODipine (NORVASC) 5 MG tablet Take 1 tablet (5 mg total) by mouth daily. 30 tablet 5  . aspirin EC 325 MG EC tablet Take 1 tablet (325 mg total) by mouth daily. 30 tablet   . atorvastatin (LIPITOR) 10 MG tablet Take 20 mg by mouth daily.     . carvedilol (COREG) 3.125 MG tablet Take 3.125 mg by mouth 2 (two) times daily with a meal.    . clopidogrel (PLAVIX) 75 MG tablet Take 1 tablet (75 mg total) by mouth daily. 30 tablet 0  . ferrous sulfate 325 (65 FE) MG tablet Take 1 tablet (325 mg total) by mouth 3 (three) times daily with meals. 30 tablet 5  . finasteride (PROSCAR) 5 MG tablet Take 5 mg by mouth daily.    . isosorbide mononitrate (IMDUR) 30 MG 24 hr tablet Take 1 tablet (30 mg total) by mouth daily. 90 tablet 3  . metroNIDAZOLE (FLAGYL) 500 MG tablet Take 500 mg by mouth 2 (two) times daily.    . nitroGLYCERIN (NITROSTAT) 0.4 MG SL tablet Place 1 tablet (0.4 mg total) under the tongue every 5 (five) minutes x 3 doses as needed for chest pain. 25  tablet 5  . omeprazole (PRILOSEC) 20 MG capsule Take 20 mg by mouth 2 (two) times daily.    Marland Kitchen oxyCODONE-acetaminophen (PERCOCET) 10-325 MG per tablet Take 1 tablet by mouth every 6 (six) hours as needed for pain. 20 tablet 0  . tamsulosin (FLOMAX) 0.4 MG CAPS capsule Take 1 capsule (0.4 mg total) by mouth daily. 7 capsule 0  . terazosin (HYTRIN) 10 MG capsule Take 10 mg by mouth at bedtime.     No current facility-administered medications for this visit.    No Known  Allergies  Social History   Social History  . Marital Status: Married    Spouse Name: N/A  . Number of Children: N/A  . Years of Education: N/A   Occupational History  . Not on file.   Social History Main Topics  . Smoking status: Former Smoker -- 0.50 packs/day for 50 years    Types: Cigarettes    Quit date: 12/02/2011  . Smokeless tobacco: Never Used  . Alcohol Use: No     Comment: 08/29/2012 "haven't had a drink in > 25 years; never had problem w/it"  . Drug Use: No  . Sexual Activity: Yes   Other Topics Concern  . Not on file   Social History Narrative   [place] [phone]. History of Agent Orange exposure, followed at [facility]: General: negative for chills, fever, night sweats or weight changes.  Cardiovascular: negative for chest pain, dyspnea on exertion, edema, orthopnea, palpitations, paroxysmal nocturnal dyspnea or shortness of breath Dermatological: negative for rash Respiratory: negative for cough or wheezing Urologic: negative for hematuria Abdominal: negative for nausea, vomiting, diarrhea, bright red blood per rectum, melena, or hematemesis Neurologic: negative for visual changes, syncope, or dizziness All other systems reviewed and are otherwise negative except as noted above.    Blood pressure 138/68, pulse 59, height 5\' 10"  (1.778 m), weight 187 lb (84.823 kg).  General appearance: alert and no distress Neck: no adenopathy, no JVD, supple, symmetrical, trachea midline, thyroid not enlarged, symmetric, no tenderness/mass/nodules and soft left carotid bruit Lungs: clear to auscultation bilaterally Heart: soft outflow tract murmur Extremities: extremities normal, atraumatic, no cyanosis or edema  EKG sinus bradycardia 59 with nonspecific ST and T-wave changes. I personally reviewed this EKG  ASSESSMENT AND PLAN:   S/P CABG x 3 11/2011: LIMA TO LAD, SVG TO OM, SVG TO RCA., VG to OM occluded, jeopardized LCX, by cath 08/29/12 -- Re-Do  CABG with SVG-OM (09/2012) History of CAD status post bypass grafting 12/02/11 the setting of left main/three-vessel disease. He had redo bypass by Dr. Cyndia Green with an SVG to an obtuse marginal branch November 2013. He was readmitted with angina and was found to have a high-grade lesion just prior to the insertion site which I stented. He has had no recurrent symptoms.  HTN (hypertension) History of hypertension with blood pressure measured today at 138/68. He is on amlodipine and carvedilol. Continue current meds at current dosing  Dyslipidemia History of dyslipidemia on atorvastatin 10 mg a day followed by the [facility], [facility] 08/25/2015 1:58 PM

## 2015-08-25 NOTE — Patient Instructions (Signed)
Medication Instructions:  Your physician recommends that you continue on your current medications as directed. Please refer to the Current Medication list given to you today.   Labwork: none  Testing/Procedures: Your physician has requested that you have a carotid duplex. This test is an ultrasound of the carotid arteries in your neck. It looks at blood flow through these arteries that supply the brain with blood. Allow one hour for this exam. There are no restrictions or special instructions.    Follow-Up: Your physician wants you to follow-up in: 12 months with Dr. Berry. You will receive a reminder letter in the mail two months in advance. If you don't receive a letter, please call our office to schedule the follow-up appointment.   Any Other Special Instructions Will Be Listed Below (If Applicable).   

## 2015-08-25 NOTE — Assessment & Plan Note (Signed)
History of CAD status post bypass grafting 12/02/11 the setting of left main/three-vessel disease. He had redo bypass by Dr. [name] with an SVG to an obtuse marginal branch November 2013. He was readmitted with angina and was found to have a high-grade lesion just prior to the insertion site which I stented. He has had no recurrent symptoms.

## 2015-08-26 ENCOUNTER — Encounter (HOSPITAL_COMMUNITY): Payer: Self-pay

## 2015-08-30 ENCOUNTER — Encounter (HOSPITAL_COMMUNITY): Payer: Self-pay

## 2015-08-31 ENCOUNTER — Encounter (HOSPITAL_COMMUNITY): Payer: Self-pay

## 2015-09-02 ENCOUNTER — Encounter (HOSPITAL_COMMUNITY): Payer: Self-pay

## 2015-09-03 ENCOUNTER — Ambulatory Visit (HOSPITAL_COMMUNITY)
Admission: RE | Admit: 2015-09-03 | Discharge: 2015-09-03 | Disposition: A | Discharge status: Home / Self Care | Payer: Medicare Other | Source: Ambulatory Visit | Attending: Cardiovascular Disease | Admitting: Cardiovascular Disease

## 2015-09-03 DIAGNOSIS — E7800 Pure hypercholesterolemia, unspecified: Secondary | ICD-10-CM | POA: Diagnosis not present

## 2015-09-03 DIAGNOSIS — R0989 Other specified symptoms and signs involving the circulatory and respiratory systems: Secondary | ICD-10-CM | POA: Diagnosis not present

## 2015-09-03 DIAGNOSIS — I6523 Occlusion and stenosis of bilateral carotid arteries: Secondary | ICD-10-CM | POA: Diagnosis not present

## 2015-09-03 DIAGNOSIS — I10 Essential (primary) hypertension: Secondary | ICD-10-CM | POA: Insufficient documentation

## 2015-09-06 ENCOUNTER — Encounter (HOSPITAL_COMMUNITY): Payer: Self-pay

## 2015-09-07 ENCOUNTER — Encounter (HOSPITAL_COMMUNITY): Payer: Medicare Other

## 2015-09-07 DIAGNOSIS — Z48812 Encounter for surgical aftercare following surgery on the circulatory system: Secondary | ICD-10-CM | POA: Insufficient documentation

## 2015-09-07 DIAGNOSIS — I252 Old myocardial infarction: Secondary | ICD-10-CM | POA: Insufficient documentation

## 2015-09-07 DIAGNOSIS — Z951 Presence of aortocoronary bypass graft: Secondary | ICD-10-CM | POA: Insufficient documentation

## 2015-09-07 DIAGNOSIS — Z955 Presence of coronary angioplasty implant and graft: Secondary | ICD-10-CM | POA: Insufficient documentation

## 2015-09-09 ENCOUNTER — Encounter (HOSPITAL_COMMUNITY): Payer: Self-pay

## 2015-09-13 ENCOUNTER — Encounter (HOSPITAL_COMMUNITY): Payer: Self-pay

## 2015-09-14 ENCOUNTER — Encounter (HOSPITAL_COMMUNITY)
Admission: RE | Admit: 2015-09-14 | Discharge: 2015-09-14 | Disposition: A | Discharge status: Home / Self Care | Payer: Self-pay | Source: Ambulatory Visit | Attending: Cardiovascular Disease | Admitting: Cardiovascular Disease

## 2015-09-16 ENCOUNTER — Encounter (HOSPITAL_COMMUNITY)
Admission: RE | Admit: 2015-09-16 | Discharge: 2015-09-16 | Disposition: A | Discharge status: Home / Self Care | Payer: Self-pay | Source: Ambulatory Visit | Attending: Cardiovascular Disease | Admitting: Cardiovascular Disease

## 2015-09-20 ENCOUNTER — Encounter (HOSPITAL_COMMUNITY): Payer: Self-pay

## 2015-09-21 ENCOUNTER — Encounter (HOSPITAL_COMMUNITY)
Admission: RE | Admit: 2015-09-21 | Discharge: 2015-09-21 | Disposition: A | Discharge status: Home / Self Care | Payer: Self-pay | Source: Ambulatory Visit | Attending: Cardiovascular Disease | Admitting: Cardiovascular Disease

## 2015-09-23 ENCOUNTER — Encounter (HOSPITAL_COMMUNITY)
Admission: RE | Admit: 2015-09-23 | Discharge: 2015-09-23 | Disposition: A | Discharge status: Home / Self Care | Payer: Self-pay | Source: Ambulatory Visit | Attending: Cardiovascular Disease | Admitting: Cardiovascular Disease

## 2015-09-27 ENCOUNTER — Encounter (HOSPITAL_COMMUNITY): Payer: Self-pay

## 2015-09-28 ENCOUNTER — Encounter (HOSPITAL_COMMUNITY)
Admission: RE | Admit: 2015-09-28 | Discharge: 2015-09-28 | Disposition: A | Discharge status: Home / Self Care | Payer: Self-pay | Source: Ambulatory Visit | Attending: Cardiovascular Disease | Admitting: Cardiovascular Disease

## 2015-10-04 ENCOUNTER — Encounter (HOSPITAL_COMMUNITY): Payer: Self-pay

## 2015-10-05 ENCOUNTER — Encounter (HOSPITAL_COMMUNITY): Payer: Self-pay

## 2015-10-07 ENCOUNTER — Encounter (HOSPITAL_COMMUNITY)
Admission: RE | Admit: 2015-10-07 | Discharge: 2015-10-07 | Disposition: A | Discharge status: Home / Self Care | Payer: Self-pay | Source: Ambulatory Visit | Attending: Cardiovascular Disease | Admitting: Cardiovascular Disease

## 2015-10-07 DIAGNOSIS — I252 Old myocardial infarction: Secondary | ICD-10-CM | POA: Insufficient documentation

## 2015-10-07 DIAGNOSIS — Z48812 Encounter for surgical aftercare following surgery on the circulatory system: Secondary | ICD-10-CM | POA: Insufficient documentation

## 2015-10-07 DIAGNOSIS — Z951 Presence of aortocoronary bypass graft: Secondary | ICD-10-CM | POA: Insufficient documentation

## 2015-10-07 DIAGNOSIS — Z955 Presence of coronary angioplasty implant and graft: Secondary | ICD-10-CM | POA: Insufficient documentation

## 2015-10-11 ENCOUNTER — Encounter (HOSPITAL_COMMUNITY): Payer: Self-pay

## 2015-10-12 ENCOUNTER — Encounter (HOSPITAL_COMMUNITY): Payer: Self-pay

## 2015-10-14 ENCOUNTER — Encounter (HOSPITAL_COMMUNITY)
Admission: RE | Admit: 2015-10-14 | Discharge: 2015-10-14 | Disposition: A | Discharge status: Home / Self Care | Payer: Self-pay | Source: Ambulatory Visit | Attending: Cardiovascular Disease | Admitting: Cardiovascular Disease

## 2015-10-18 ENCOUNTER — Encounter (HOSPITAL_COMMUNITY): Payer: Self-pay

## 2015-10-19 ENCOUNTER — Encounter (HOSPITAL_COMMUNITY)
Admission: RE | Admit: 2015-10-19 | Discharge: 2015-10-19 | Disposition: A | Discharge status: Home / Self Care | Payer: Self-pay | Source: Ambulatory Visit | Attending: Cardiovascular Disease | Admitting: Cardiovascular Disease

## 2015-10-21 ENCOUNTER — Encounter (HOSPITAL_COMMUNITY): Payer: Self-pay

## 2015-10-25 ENCOUNTER — Encounter (HOSPITAL_COMMUNITY): Payer: Self-pay

## 2015-10-26 ENCOUNTER — Encounter (HOSPITAL_COMMUNITY): Payer: Self-pay

## 2015-10-28 ENCOUNTER — Encounter (HOSPITAL_COMMUNITY): Payer: Self-pay

## 2015-11-02 ENCOUNTER — Encounter (HOSPITAL_COMMUNITY): Payer: Self-pay

## 2015-11-04 ENCOUNTER — Encounter (HOSPITAL_COMMUNITY): Payer: Self-pay

## 2015-11-16 ENCOUNTER — Encounter (HOSPITAL_COMMUNITY)
Admission: RE | Admit: 2015-11-16 | Discharge: 2015-11-16 | Disposition: A | Discharge status: Home / Self Care | Payer: Medicare Other | Source: Ambulatory Visit | Attending: Cardiovascular Disease | Admitting: Cardiovascular Disease

## 2015-11-16 DIAGNOSIS — Z955 Presence of coronary angioplasty implant and graft: Secondary | ICD-10-CM | POA: Insufficient documentation

## 2015-11-16 DIAGNOSIS — I252 Old myocardial infarction: Secondary | ICD-10-CM | POA: Diagnosis not present

## 2015-11-16 DIAGNOSIS — Z951 Presence of aortocoronary bypass graft: Secondary | ICD-10-CM | POA: Insufficient documentation

## 2015-11-16 DIAGNOSIS — Z48812 Encounter for surgical aftercare following surgery on the circulatory system: Secondary | ICD-10-CM | POA: Insufficient documentation

## 2015-11-18 ENCOUNTER — Encounter (HOSPITAL_COMMUNITY)
Admission: RE | Admit: 2015-11-18 | Discharge: 2015-11-18 | Disposition: A | Discharge status: Home / Self Care | Payer: Medicare Other | Source: Ambulatory Visit | Attending: Cardiovascular Disease | Admitting: Cardiovascular Disease

## 2015-11-30 ENCOUNTER — Encounter (HOSPITAL_COMMUNITY)
Admission: RE | Admit: 2015-11-30 | Discharge: 2015-11-30 | Disposition: A | Discharge status: Home / Self Care | Payer: Medicare Other | Source: Ambulatory Visit | Attending: Cardiovascular Disease | Admitting: Cardiovascular Disease

## 2015-12-13 ENCOUNTER — Encounter (HOSPITAL_COMMUNITY): Payer: Self-pay

## 2015-12-13 DIAGNOSIS — I214 Non-ST elevation (NSTEMI) myocardial infarction: Secondary | ICD-10-CM | POA: Insufficient documentation

## 2015-12-14 ENCOUNTER — Encounter (HOSPITAL_COMMUNITY): Payer: Self-pay

## 2015-12-16 ENCOUNTER — Encounter (HOSPITAL_COMMUNITY): Payer: Self-pay

## 2015-12-20 ENCOUNTER — Encounter (HOSPITAL_COMMUNITY): Payer: Self-pay

## 2015-12-21 ENCOUNTER — Encounter (HOSPITAL_COMMUNITY)
Admission: RE | Admit: 2015-12-21 | Discharge: 2015-12-21 | Disposition: A | Discharge status: Home / Self Care | Payer: Self-pay | Source: Ambulatory Visit | Attending: Cardiovascular Disease | Admitting: Cardiovascular Disease

## 2015-12-23 ENCOUNTER — Encounter (HOSPITAL_COMMUNITY)
Admission: RE | Admit: 2015-12-23 | Discharge: 2015-12-23 | Disposition: A | Discharge status: Home / Self Care | Payer: Self-pay | Source: Ambulatory Visit | Attending: Cardiovascular Disease | Admitting: Cardiovascular Disease

## 2015-12-27 ENCOUNTER — Encounter (HOSPITAL_COMMUNITY): Payer: Self-pay

## 2015-12-28 ENCOUNTER — Encounter (HOSPITAL_COMMUNITY)
Admission: RE | Admit: 2015-12-28 | Discharge: 2015-12-28 | Disposition: A | Discharge status: Home / Self Care | Payer: Self-pay | Source: Ambulatory Visit | Attending: Cardiovascular Disease | Admitting: Cardiovascular Disease

## 2015-12-30 ENCOUNTER — Encounter (HOSPITAL_COMMUNITY): Payer: Self-pay

## 2016-01-03 ENCOUNTER — Encounter (HOSPITAL_COMMUNITY): Payer: Self-pay

## 2016-01-04 ENCOUNTER — Encounter (HOSPITAL_COMMUNITY): Payer: Self-pay

## 2016-01-06 ENCOUNTER — Encounter (HOSPITAL_COMMUNITY)
Admission: RE | Admit: 2016-01-06 | Discharge: 2016-01-06 | Disposition: A | Discharge status: Home / Self Care | Payer: Self-pay | Source: Ambulatory Visit | Attending: Cardiovascular Disease | Admitting: Cardiovascular Disease

## 2016-01-06 DIAGNOSIS — I214 Non-ST elevation (NSTEMI) myocardial infarction: Secondary | ICD-10-CM | POA: Insufficient documentation

## 2016-01-10 ENCOUNTER — Encounter (HOSPITAL_COMMUNITY): Payer: Self-pay

## 2016-01-11 ENCOUNTER — Encounter (HOSPITAL_COMMUNITY): Payer: Self-pay

## 2016-01-13 ENCOUNTER — Encounter (HOSPITAL_COMMUNITY)
Admission: RE | Admit: 2016-01-13 | Discharge: 2016-01-13 | Disposition: A | Discharge status: Home / Self Care | Payer: Self-pay | Source: Ambulatory Visit | Attending: Cardiovascular Disease | Admitting: Cardiovascular Disease

## 2016-01-17 ENCOUNTER — Encounter (HOSPITAL_COMMUNITY): Payer: Self-pay

## 2016-01-18 ENCOUNTER — Encounter (HOSPITAL_COMMUNITY): Payer: Self-pay

## 2016-01-20 ENCOUNTER — Encounter (HOSPITAL_COMMUNITY)
Admission: RE | Admit: 2016-01-20 | Discharge: 2016-01-20 | Disposition: A | Discharge status: Home / Self Care | Payer: Self-pay | Source: Ambulatory Visit | Attending: Cardiovascular Disease | Admitting: Cardiovascular Disease

## 2016-01-24 ENCOUNTER — Encounter (HOSPITAL_COMMUNITY): Payer: Self-pay

## 2016-01-25 ENCOUNTER — Encounter (HOSPITAL_COMMUNITY)
Admission: RE | Admit: 2016-01-25 | Discharge: 2016-01-25 | Disposition: A | Discharge status: Home / Self Care | Payer: Self-pay | Source: Ambulatory Visit | Attending: Cardiovascular Disease | Admitting: Cardiovascular Disease

## 2016-01-27 ENCOUNTER — Encounter (HOSPITAL_COMMUNITY): Payer: Self-pay

## 2016-01-31 ENCOUNTER — Encounter (HOSPITAL_COMMUNITY): Payer: Self-pay

## 2016-02-01 ENCOUNTER — Encounter (HOSPITAL_COMMUNITY)
Admission: RE | Admit: 2016-02-01 | Discharge: 2016-02-01 | Disposition: A | Discharge status: Home / Self Care | Payer: Self-pay | Source: Ambulatory Visit | Attending: Cardiovascular Disease | Admitting: Cardiovascular Disease

## 2016-02-03 ENCOUNTER — Encounter (HOSPITAL_COMMUNITY)
Admission: RE | Admit: 2016-02-03 | Discharge: 2016-02-03 | Disposition: A | Discharge status: Home / Self Care | Payer: Self-pay | Source: Ambulatory Visit | Attending: Cardiovascular Disease | Admitting: Cardiovascular Disease

## 2016-02-07 ENCOUNTER — Encounter (HOSPITAL_COMMUNITY): Payer: Self-pay

## 2016-02-07 DIAGNOSIS — I214 Non-ST elevation (NSTEMI) myocardial infarction: Secondary | ICD-10-CM | POA: Insufficient documentation

## 2016-02-08 ENCOUNTER — Encounter (HOSPITAL_COMMUNITY): Payer: Self-pay

## 2016-02-10 ENCOUNTER — Encounter (HOSPITAL_COMMUNITY): Payer: Self-pay

## 2016-02-14 ENCOUNTER — Encounter (HOSPITAL_COMMUNITY): Payer: Self-pay

## 2016-02-15 ENCOUNTER — Encounter (HOSPITAL_COMMUNITY): Payer: Self-pay

## 2016-02-17 ENCOUNTER — Encounter (HOSPITAL_COMMUNITY): Payer: Self-pay

## 2016-02-21 ENCOUNTER — Encounter (HOSPITAL_COMMUNITY): Payer: Self-pay

## 2016-02-22 ENCOUNTER — Encounter (HOSPITAL_COMMUNITY)
Admission: RE | Admit: 2016-02-22 | Discharge: 2016-02-22 | Disposition: A | Discharge status: Home / Self Care | Payer: Self-pay | Source: Ambulatory Visit | Attending: Cardiovascular Disease | Admitting: Cardiovascular Disease

## 2016-02-24 ENCOUNTER — Encounter (HOSPITAL_COMMUNITY): Payer: Self-pay

## 2016-02-28 ENCOUNTER — Encounter (HOSPITAL_COMMUNITY): Payer: Self-pay

## 2016-02-29 ENCOUNTER — Encounter (HOSPITAL_COMMUNITY): Payer: Self-pay

## 2016-03-02 ENCOUNTER — Encounter (HOSPITAL_COMMUNITY): Payer: Self-pay

## 2016-03-06 ENCOUNTER — Encounter (HOSPITAL_COMMUNITY): Payer: Self-pay | Attending: Cardiovascular Disease

## 2016-03-06 DIAGNOSIS — I214 Non-ST elevation (NSTEMI) myocardial infarction: Secondary | ICD-10-CM | POA: Insufficient documentation

## 2016-03-07 ENCOUNTER — Encounter (HOSPITAL_COMMUNITY): Payer: Self-pay

## 2016-03-09 ENCOUNTER — Encounter (HOSPITAL_COMMUNITY): Payer: Self-pay

## 2016-03-13 ENCOUNTER — Encounter (HOSPITAL_COMMUNITY): Payer: Self-pay

## 2016-03-14 ENCOUNTER — Encounter (HOSPITAL_COMMUNITY): Payer: Self-pay

## 2016-03-16 ENCOUNTER — Encounter (HOSPITAL_COMMUNITY): Payer: Self-pay

## 2016-03-20 ENCOUNTER — Encounter (HOSPITAL_COMMUNITY): Payer: Self-pay

## 2016-03-21 ENCOUNTER — Encounter (HOSPITAL_COMMUNITY): Payer: Self-pay

## 2016-03-23 ENCOUNTER — Encounter (HOSPITAL_COMMUNITY): Payer: Self-pay

## 2016-03-27 ENCOUNTER — Encounter (HOSPITAL_COMMUNITY): Payer: Self-pay

## 2016-03-28 ENCOUNTER — Encounter (HOSPITAL_COMMUNITY): Payer: Self-pay

## 2016-03-30 ENCOUNTER — Encounter (HOSPITAL_COMMUNITY): Payer: Self-pay

## 2016-04-04 ENCOUNTER — Encounter (HOSPITAL_COMMUNITY): Payer: Self-pay

## 2016-04-06 ENCOUNTER — Encounter (HOSPITAL_COMMUNITY)
Admission: RE | Admit: 2016-04-06 | Discharge: 2016-04-06 | Disposition: A | Discharge status: Home / Self Care | Payer: Self-pay | Source: Ambulatory Visit | Attending: Cardiovascular Disease | Admitting: Cardiovascular Disease

## 2016-04-06 DIAGNOSIS — I214 Non-ST elevation (NSTEMI) myocardial infarction: Secondary | ICD-10-CM | POA: Insufficient documentation

## 2016-04-10 ENCOUNTER — Encounter (HOSPITAL_COMMUNITY): Payer: Self-pay

## 2016-04-11 ENCOUNTER — Encounter (HOSPITAL_COMMUNITY): Payer: Self-pay

## 2016-04-13 ENCOUNTER — Encounter (HOSPITAL_COMMUNITY)
Admission: RE | Admit: 2016-04-13 | Discharge: 2016-04-13 | Disposition: A | Discharge status: Home / Self Care | Payer: Self-pay | Source: Ambulatory Visit | Attending: Cardiovascular Disease | Admitting: Cardiovascular Disease

## 2016-04-17 ENCOUNTER — Encounter (HOSPITAL_COMMUNITY): Payer: Self-pay

## 2016-04-18 ENCOUNTER — Encounter (HOSPITAL_COMMUNITY)
Admission: RE | Admit: 2016-04-18 | Discharge: 2016-04-18 | Disposition: A | Discharge status: Home / Self Care | Payer: Self-pay | Source: Ambulatory Visit | Attending: Cardiovascular Disease | Admitting: Cardiovascular Disease

## 2016-04-20 ENCOUNTER — Encounter (HOSPITAL_COMMUNITY): Payer: Self-pay

## 2016-04-24 ENCOUNTER — Encounter (HOSPITAL_COMMUNITY): Payer: Self-pay

## 2016-04-25 ENCOUNTER — Encounter (HOSPITAL_COMMUNITY)
Admission: RE | Admit: 2016-04-25 | Discharge: 2016-04-25 | Disposition: A | Discharge status: Home / Self Care | Payer: Self-pay | Source: Ambulatory Visit | Attending: Cardiovascular Disease | Admitting: Cardiovascular Disease

## 2016-04-27 ENCOUNTER — Encounter (HOSPITAL_COMMUNITY): Payer: Self-pay

## 2016-05-01 ENCOUNTER — Encounter (HOSPITAL_COMMUNITY): Payer: Self-pay

## 2016-05-02 ENCOUNTER — Encounter (HOSPITAL_COMMUNITY): Payer: Self-pay

## 2016-05-04 ENCOUNTER — Encounter (HOSPITAL_COMMUNITY): Payer: Self-pay

## 2016-05-08 ENCOUNTER — Encounter (HOSPITAL_COMMUNITY): Payer: Self-pay

## 2016-05-08 DIAGNOSIS — I214 Non-ST elevation (NSTEMI) myocardial infarction: Secondary | ICD-10-CM | POA: Insufficient documentation

## 2016-05-11 ENCOUNTER — Encounter (HOSPITAL_COMMUNITY): Payer: Self-pay

## 2016-05-15 ENCOUNTER — Encounter (HOSPITAL_COMMUNITY): Payer: Self-pay

## 2016-05-16 ENCOUNTER — Encounter (HOSPITAL_COMMUNITY)
Admission: RE | Admit: 2016-05-16 | Discharge: 2016-05-16 | Disposition: A | Discharge status: Home / Self Care | Payer: Self-pay | Source: Ambulatory Visit | Attending: Cardiovascular Disease | Admitting: Cardiovascular Disease

## 2016-05-18 ENCOUNTER — Encounter (HOSPITAL_COMMUNITY): Payer: Self-pay

## 2016-05-22 ENCOUNTER — Encounter (HOSPITAL_COMMUNITY): Payer: Self-pay

## 2016-05-23 ENCOUNTER — Encounter (HOSPITAL_COMMUNITY): Payer: Self-pay

## 2016-05-25 ENCOUNTER — Encounter (HOSPITAL_COMMUNITY): Payer: Self-pay

## 2016-05-29 ENCOUNTER — Encounter (HOSPITAL_COMMUNITY): Payer: Self-pay

## 2016-05-30 ENCOUNTER — Encounter (HOSPITAL_COMMUNITY)
Admission: RE | Admit: 2016-05-30 | Discharge: 2016-05-30 | Disposition: A | Discharge status: Home / Self Care | Payer: Self-pay | Source: Ambulatory Visit | Attending: Cardiovascular Disease | Admitting: Cardiovascular Disease

## 2016-06-01 ENCOUNTER — Encounter (HOSPITAL_COMMUNITY)
Admission: RE | Admit: 2016-06-01 | Discharge: 2016-06-01 | Disposition: A | Discharge status: Home / Self Care | Payer: Self-pay | Source: Ambulatory Visit | Attending: Cardiovascular Disease | Admitting: Cardiovascular Disease

## 2016-06-05 ENCOUNTER — Encounter (HOSPITAL_COMMUNITY): Payer: Self-pay

## 2016-06-08 ENCOUNTER — Encounter (HOSPITAL_COMMUNITY): Payer: Self-pay

## 2016-06-08 DIAGNOSIS — I2510 Atherosclerotic heart disease of native coronary artery without angina pectoris: Secondary | ICD-10-CM | POA: Insufficient documentation

## 2016-06-13 ENCOUNTER — Encounter (HOSPITAL_COMMUNITY): Payer: Self-pay

## 2016-06-15 ENCOUNTER — Encounter (HOSPITAL_COMMUNITY): Payer: Self-pay

## 2016-06-20 ENCOUNTER — Telehealth: Payer: Self-pay | Admitting: *Deleted

## 2016-06-20 ENCOUNTER — Encounter (HOSPITAL_COMMUNITY): Payer: Self-pay

## 2016-06-20 NOTE — Telephone Encounter (Signed)
Orders for [facility] signed by Dr Berry and faxed to [phone]. 

## 2016-06-22 ENCOUNTER — Encounter (HOSPITAL_COMMUNITY): Payer: Self-pay

## 2016-06-27 ENCOUNTER — Encounter (HOSPITAL_COMMUNITY)
Admission: RE | Admit: 2016-06-27 | Discharge: 2016-06-27 | Disposition: A | Discharge status: Home / Self Care | Payer: Self-pay | Source: Ambulatory Visit | Attending: Cardiovascular Disease | Admitting: Cardiovascular Disease

## 2016-06-29 ENCOUNTER — Encounter (HOSPITAL_COMMUNITY)
Admission: RE | Admit: 2016-06-29 | Discharge: 2016-06-29 | Disposition: A | Discharge status: Home / Self Care | Payer: Self-pay | Source: Ambulatory Visit | Attending: Cardiovascular Disease | Admitting: Cardiovascular Disease

## 2016-07-04 ENCOUNTER — Encounter (HOSPITAL_COMMUNITY): Payer: Self-pay

## 2016-07-06 ENCOUNTER — Encounter (HOSPITAL_COMMUNITY): Payer: Self-pay

## 2016-07-11 ENCOUNTER — Encounter (HOSPITAL_COMMUNITY): Payer: Self-pay

## 2016-07-11 DIAGNOSIS — I2510 Atherosclerotic heart disease of native coronary artery without angina pectoris: Secondary | ICD-10-CM | POA: Insufficient documentation

## 2016-07-13 ENCOUNTER — Encounter (HOSPITAL_COMMUNITY): Payer: Self-pay

## 2016-07-18 ENCOUNTER — Encounter (HOSPITAL_COMMUNITY): Payer: Self-pay

## 2016-07-20 ENCOUNTER — Encounter (HOSPITAL_COMMUNITY): Payer: Self-pay

## 2016-07-25 ENCOUNTER — Encounter (HOSPITAL_COMMUNITY): Payer: Self-pay

## 2016-07-27 ENCOUNTER — Encounter (HOSPITAL_COMMUNITY): Payer: Self-pay

## 2016-08-01 ENCOUNTER — Encounter (HOSPITAL_COMMUNITY)
Admission: RE | Admit: 2016-08-01 | Discharge: 2016-08-01 | Disposition: A | Discharge status: Home / Self Care | Payer: Self-pay | Source: Ambulatory Visit | Attending: Cardiovascular Disease | Admitting: Cardiovascular Disease

## 2016-08-03 ENCOUNTER — Encounter (HOSPITAL_COMMUNITY)
Admission: RE | Admit: 2016-08-03 | Discharge: 2016-08-03 | Disposition: A | Discharge status: Home / Self Care | Payer: Self-pay | Source: Ambulatory Visit | Attending: Cardiovascular Disease | Admitting: Cardiovascular Disease

## 2016-08-08 ENCOUNTER — Encounter (HOSPITAL_COMMUNITY)
Admission: RE | Admit: 2016-08-08 | Discharge: 2016-08-08 | Disposition: A | Discharge status: Home / Self Care | Payer: Self-pay | Source: Ambulatory Visit | Attending: Cardiovascular Disease | Admitting: Cardiovascular Disease

## 2016-08-08 DIAGNOSIS — I2510 Atherosclerotic heart disease of native coronary artery without angina pectoris: Secondary | ICD-10-CM | POA: Insufficient documentation

## 2016-08-10 ENCOUNTER — Encounter (HOSPITAL_COMMUNITY)
Admission: RE | Admit: 2016-08-10 | Discharge: 2016-08-10 | Disposition: A | Discharge status: Home / Self Care | Payer: Self-pay | Source: Ambulatory Visit | Attending: Cardiovascular Disease | Admitting: Cardiovascular Disease

## 2016-08-15 ENCOUNTER — Encounter (HOSPITAL_COMMUNITY)
Admission: RE | Admit: 2016-08-15 | Discharge: 2016-08-15 | Disposition: A | Discharge status: Home / Self Care | Payer: Self-pay | Source: Ambulatory Visit | Attending: Cardiovascular Disease | Admitting: Cardiovascular Disease

## 2016-08-17 ENCOUNTER — Encounter (HOSPITAL_COMMUNITY): Payer: Self-pay

## 2016-08-22 ENCOUNTER — Encounter (HOSPITAL_COMMUNITY): Payer: Self-pay

## 2016-08-24 ENCOUNTER — Encounter (HOSPITAL_COMMUNITY)
Admission: RE | Admit: 2016-08-24 | Discharge: 2016-08-24 | Disposition: A | Discharge status: Home / Self Care | Payer: Self-pay | Source: Ambulatory Visit | Attending: Cardiovascular Disease | Admitting: Cardiovascular Disease

## 2016-08-29 ENCOUNTER — Encounter (HOSPITAL_COMMUNITY)
Admission: RE | Admit: 2016-08-29 | Discharge: 2016-08-29 | Disposition: A | Discharge status: Home / Self Care | Payer: Self-pay | Source: Ambulatory Visit | Attending: Cardiovascular Disease | Admitting: Cardiovascular Disease

## 2016-08-31 ENCOUNTER — Encounter (HOSPITAL_COMMUNITY): Payer: Self-pay

## 2016-09-05 ENCOUNTER — Encounter (HOSPITAL_COMMUNITY)
Admission: RE | Admit: 2016-09-05 | Discharge: 2016-09-05 | Disposition: A | Discharge status: Home / Self Care | Payer: Self-pay | Source: Ambulatory Visit | Attending: Cardiovascular Disease | Admitting: Cardiovascular Disease

## 2016-09-07 ENCOUNTER — Encounter (HOSPITAL_COMMUNITY): Payer: Self-pay

## 2016-09-07 DIAGNOSIS — I2510 Atherosclerotic heart disease of native coronary artery without angina pectoris: Secondary | ICD-10-CM | POA: Insufficient documentation

## 2016-09-12 ENCOUNTER — Encounter (HOSPITAL_COMMUNITY): Payer: Self-pay

## 2016-09-14 ENCOUNTER — Encounter (HOSPITAL_COMMUNITY)
Admission: RE | Admit: 2016-09-14 | Discharge: 2016-09-14 | Disposition: A | Discharge status: Home / Self Care | Payer: Self-pay | Source: Ambulatory Visit | Attending: Cardiovascular Disease | Admitting: Cardiovascular Disease

## 2016-09-19 ENCOUNTER — Encounter (HOSPITAL_COMMUNITY)
Admission: RE | Admit: 2016-09-19 | Discharge: 2016-09-19 | Disposition: A | Discharge status: Home / Self Care | Payer: Self-pay | Source: Ambulatory Visit | Attending: Cardiovascular Disease | Admitting: Cardiovascular Disease

## 2016-09-21 ENCOUNTER — Encounter (HOSPITAL_COMMUNITY): Payer: Self-pay

## 2016-09-26 ENCOUNTER — Encounter (HOSPITAL_COMMUNITY): Payer: Self-pay

## 2016-10-03 ENCOUNTER — Encounter (HOSPITAL_COMMUNITY): Payer: Self-pay

## 2016-10-05 ENCOUNTER — Encounter (HOSPITAL_COMMUNITY): Payer: Self-pay

## 2016-10-10 ENCOUNTER — Encounter (HOSPITAL_COMMUNITY)
Admission: RE | Admit: 2016-10-10 | Discharge: 2016-10-10 | Disposition: A | Discharge status: Home / Self Care | Payer: Self-pay | Source: Ambulatory Visit | Attending: Cardiovascular Disease | Admitting: Cardiovascular Disease

## 2016-10-10 DIAGNOSIS — I2510 Atherosclerotic heart disease of native coronary artery without angina pectoris: Secondary | ICD-10-CM | POA: Insufficient documentation

## 2016-10-12 ENCOUNTER — Encounter (HOSPITAL_COMMUNITY)
Admission: RE | Admit: 2016-10-12 | Discharge: 2016-10-12 | Disposition: A | Discharge status: Home / Self Care | Payer: Self-pay | Source: Ambulatory Visit | Attending: Cardiovascular Disease | Admitting: Cardiovascular Disease

## 2016-10-17 ENCOUNTER — Encounter (HOSPITAL_COMMUNITY): Payer: Self-pay

## 2016-10-19 ENCOUNTER — Encounter (HOSPITAL_COMMUNITY)
Admission: RE | Admit: 2016-10-19 | Discharge: 2016-10-19 | Disposition: A | Discharge status: Home / Self Care | Payer: Self-pay | Source: Ambulatory Visit | Attending: Cardiovascular Disease | Admitting: Cardiovascular Disease

## 2016-10-24 ENCOUNTER — Encounter (HOSPITAL_COMMUNITY)
Admission: RE | Admit: 2016-10-24 | Discharge: 2016-10-24 | Disposition: A | Discharge status: Home / Self Care | Payer: Self-pay | Source: Ambulatory Visit | Attending: Cardiovascular Disease | Admitting: Cardiovascular Disease

## 2016-10-26 ENCOUNTER — Encounter (HOSPITAL_COMMUNITY): Payer: Self-pay

## 2016-10-31 ENCOUNTER — Encounter (HOSPITAL_COMMUNITY): Payer: Self-pay

## 2016-11-02 ENCOUNTER — Encounter (HOSPITAL_COMMUNITY): Payer: Self-pay

## 2016-11-07 ENCOUNTER — Encounter (HOSPITAL_COMMUNITY): Payer: Self-pay

## 2016-11-07 DIAGNOSIS — I2510 Atherosclerotic heart disease of native coronary artery without angina pectoris: Secondary | ICD-10-CM | POA: Insufficient documentation

## 2016-11-09 ENCOUNTER — Encounter (HOSPITAL_COMMUNITY): Payer: Self-pay

## 2016-11-14 ENCOUNTER — Encounter (HOSPITAL_COMMUNITY)
Admission: RE | Admit: 2016-11-14 | Discharge: 2016-11-14 | Disposition: A | Discharge status: Home / Self Care | Payer: Self-pay | Source: Ambulatory Visit | Attending: Cardiovascular Disease | Admitting: Cardiovascular Disease

## 2016-11-16 ENCOUNTER — Encounter (HOSPITAL_COMMUNITY)
Admission: RE | Admit: 2016-11-16 | Discharge: 2016-11-16 | Disposition: A | Discharge status: Home / Self Care | Payer: Self-pay | Source: Ambulatory Visit | Attending: Cardiovascular Disease | Admitting: Cardiovascular Disease

## 2016-11-21 ENCOUNTER — Encounter (HOSPITAL_COMMUNITY): Payer: Self-pay

## 2016-11-23 ENCOUNTER — Encounter (HOSPITAL_COMMUNITY): Payer: Self-pay

## 2016-11-28 ENCOUNTER — Encounter (HOSPITAL_COMMUNITY): Payer: Self-pay

## 2016-11-30 ENCOUNTER — Encounter (HOSPITAL_COMMUNITY): Payer: Self-pay

## 2016-12-05 ENCOUNTER — Encounter (HOSPITAL_COMMUNITY)
Admission: RE | Admit: 2016-12-05 | Discharge: 2016-12-05 | Disposition: A | Discharge status: Home / Self Care | Payer: Self-pay | Source: Ambulatory Visit | Attending: Cardiovascular Disease | Admitting: Cardiovascular Disease

## 2016-12-07 ENCOUNTER — Telehealth: Payer: Self-pay | Admitting: Cardiovascular Disease

## 2016-12-07 NOTE — Telephone Encounter (Signed)
Continuation of Cardiac Rehabilitation Form  Signed by Dr. Gwenlyn Found on 12-05-16 and faxed to number provided

## 2016-12-12 ENCOUNTER — Encounter (HOSPITAL_COMMUNITY)
Admission: RE | Admit: 2016-12-12 | Discharge: 2016-12-12 | Disposition: A | Discharge status: Home / Self Care | Payer: Medicare Other | Source: Ambulatory Visit | Attending: Cardiovascular Disease | Admitting: Cardiovascular Disease

## 2016-12-12 DIAGNOSIS — I2510 Atherosclerotic heart disease of native coronary artery without angina pectoris: Secondary | ICD-10-CM | POA: Insufficient documentation

## 2016-12-14 ENCOUNTER — Encounter (HOSPITAL_COMMUNITY): Payer: Medicare Other

## 2016-12-19 ENCOUNTER — Encounter (HOSPITAL_COMMUNITY): Payer: Medicare Other

## 2016-12-21 ENCOUNTER — Encounter (HOSPITAL_COMMUNITY): Payer: Medicare Other

## 2016-12-26 ENCOUNTER — Encounter (HOSPITAL_COMMUNITY)
Admission: RE | Admit: 2016-12-26 | Discharge: 2016-12-26 | Disposition: A | Discharge status: Home / Self Care | Payer: Medicare Other | Source: Ambulatory Visit | Attending: Cardiovascular Disease | Admitting: Cardiovascular Disease

## 2016-12-28 ENCOUNTER — Encounter (HOSPITAL_COMMUNITY)
Admission: RE | Admit: 2016-12-28 | Discharge: 2016-12-28 | Disposition: A | Discharge status: Home / Self Care | Payer: Medicare Other | Source: Ambulatory Visit | Attending: Cardiovascular Disease | Admitting: Cardiovascular Disease

## 2017-01-02 ENCOUNTER — Encounter (HOSPITAL_COMMUNITY): Payer: Medicare Other

## 2017-01-04 ENCOUNTER — Encounter (HOSPITAL_COMMUNITY): Payer: Medicare Other

## 2017-01-04 DIAGNOSIS — I2510 Atherosclerotic heart disease of native coronary artery without angina pectoris: Secondary | ICD-10-CM | POA: Insufficient documentation

## 2017-01-09 ENCOUNTER — Encounter (HOSPITAL_COMMUNITY)
Admission: RE | Admit: 2017-01-09 | Discharge: 2017-01-09 | Disposition: A | Discharge status: Home / Self Care | Payer: Medicare Other | Source: Ambulatory Visit | Attending: Cardiovascular Disease | Admitting: Cardiovascular Disease

## 2017-01-11 ENCOUNTER — Encounter (HOSPITAL_COMMUNITY)
Admission: RE | Admit: 2017-01-11 | Discharge: 2017-01-11 | Disposition: A | Discharge status: Home / Self Care | Payer: Medicare Other | Source: Ambulatory Visit | Attending: Cardiovascular Disease | Admitting: Cardiovascular Disease

## 2017-01-16 ENCOUNTER — Encounter (HOSPITAL_COMMUNITY): Payer: Medicare Other

## 2017-01-18 ENCOUNTER — Encounter (HOSPITAL_COMMUNITY): Payer: Medicare Other

## 2017-01-23 ENCOUNTER — Encounter (HOSPITAL_COMMUNITY)
Admission: RE | Admit: 2017-01-23 | Discharge: 2017-01-23 | Disposition: A | Discharge status: Home / Self Care | Payer: Medicare Other | Source: Ambulatory Visit | Attending: Cardiovascular Disease | Admitting: Cardiovascular Disease

## 2017-01-25 ENCOUNTER — Encounter (HOSPITAL_COMMUNITY)
Admission: RE | Admit: 2017-01-25 | Discharge: 2017-01-25 | Disposition: A | Discharge status: Home / Self Care | Payer: Medicare Other | Source: Ambulatory Visit | Attending: Cardiovascular Disease | Admitting: Cardiovascular Disease

## 2017-01-30 ENCOUNTER — Encounter (HOSPITAL_COMMUNITY): Payer: Medicare Other

## 2017-02-01 ENCOUNTER — Encounter (HOSPITAL_COMMUNITY)
Admission: RE | Admit: 2017-02-01 | Discharge: 2017-02-01 | Disposition: A | Discharge status: Home / Self Care | Payer: Medicare Other | Source: Ambulatory Visit | Attending: Cardiovascular Disease | Admitting: Cardiovascular Disease

## 2017-02-06 ENCOUNTER — Encounter (HOSPITAL_COMMUNITY): Payer: Medicare Other

## 2017-02-06 DIAGNOSIS — I2510 Atherosclerotic heart disease of native coronary artery without angina pectoris: Secondary | ICD-10-CM | POA: Insufficient documentation

## 2017-02-08 ENCOUNTER — Encounter (HOSPITAL_COMMUNITY)
Admission: RE | Admit: 2017-02-08 | Discharge: 2017-02-08 | Disposition: A | Discharge status: Home / Self Care | Payer: Medicare Other | Source: Ambulatory Visit | Attending: Cardiovascular Disease | Admitting: Cardiovascular Disease

## 2017-02-13 ENCOUNTER — Encounter (HOSPITAL_COMMUNITY)
Admission: RE | Admit: 2017-02-13 | Discharge: 2017-02-13 | Disposition: A | Discharge status: Home / Self Care | Payer: Medicare Other | Source: Ambulatory Visit | Attending: Cardiovascular Disease | Admitting: Cardiovascular Disease

## 2017-02-15 ENCOUNTER — Encounter (HOSPITAL_COMMUNITY): Payer: Medicare Other

## 2017-02-20 ENCOUNTER — Encounter (HOSPITAL_COMMUNITY)
Admission: RE | Admit: 2017-02-20 | Discharge: 2017-02-20 | Disposition: A | Discharge status: Home / Self Care | Payer: Medicare Other | Source: Ambulatory Visit | Attending: Cardiovascular Disease | Admitting: Cardiovascular Disease

## 2017-02-22 ENCOUNTER — Encounter (HOSPITAL_COMMUNITY): Payer: Medicare Other

## 2017-02-27 ENCOUNTER — Encounter (HOSPITAL_COMMUNITY)
Admission: RE | Admit: 2017-02-27 | Discharge: 2017-02-27 | Disposition: A | Discharge status: Home / Self Care | Payer: Medicare Other | Source: Ambulatory Visit | Attending: Cardiovascular Disease | Admitting: Cardiovascular Disease

## 2017-03-01 ENCOUNTER — Encounter (HOSPITAL_COMMUNITY): Payer: Medicare Other

## 2017-03-06 ENCOUNTER — Encounter (HOSPITAL_COMMUNITY)
Admission: RE | Admit: 2017-03-06 | Discharge: 2017-03-06 | Disposition: A | Discharge status: Home / Self Care | Payer: Medicare Other | Source: Ambulatory Visit | Attending: Cardiovascular Disease | Admitting: Cardiovascular Disease

## 2017-03-06 DIAGNOSIS — I2510 Atherosclerotic heart disease of native coronary artery without angina pectoris: Secondary | ICD-10-CM | POA: Insufficient documentation

## 2017-03-08 ENCOUNTER — Encounter (HOSPITAL_COMMUNITY): Payer: Medicare Other

## 2017-03-13 ENCOUNTER — Encounter (HOSPITAL_COMMUNITY): Payer: Medicare Other

## 2017-03-15 ENCOUNTER — Encounter (HOSPITAL_COMMUNITY): Payer: Medicare Other

## 2017-03-20 ENCOUNTER — Encounter (HOSPITAL_COMMUNITY): Payer: Medicare Other

## 2017-03-22 ENCOUNTER — Encounter (HOSPITAL_COMMUNITY): Payer: Medicare Other

## 2017-03-27 ENCOUNTER — Encounter (HOSPITAL_COMMUNITY): Payer: Medicare Other

## 2017-03-29 ENCOUNTER — Encounter (HOSPITAL_COMMUNITY): Payer: Medicare Other

## 2017-04-03 ENCOUNTER — Encounter (HOSPITAL_COMMUNITY): Payer: Medicare Other

## 2017-04-05 ENCOUNTER — Encounter (HOSPITAL_COMMUNITY): Payer: Medicare Other

## 2017-04-10 ENCOUNTER — Encounter (HOSPITAL_COMMUNITY): Payer: Medicare Other

## 2017-04-10 DIAGNOSIS — I2510 Atherosclerotic heart disease of native coronary artery without angina pectoris: Secondary | ICD-10-CM | POA: Insufficient documentation

## 2017-04-12 ENCOUNTER — Encounter (HOSPITAL_COMMUNITY): Payer: Medicare Other

## 2017-04-17 ENCOUNTER — Encounter (HOSPITAL_COMMUNITY): Payer: Medicare Other

## 2017-04-19 ENCOUNTER — Encounter (HOSPITAL_COMMUNITY): Payer: Medicare Other

## 2017-04-24 ENCOUNTER — Encounter (HOSPITAL_COMMUNITY): Payer: Medicare Other

## 2017-04-26 ENCOUNTER — Encounter (HOSPITAL_COMMUNITY): Payer: Medicare Other

## 2017-05-01 ENCOUNTER — Encounter (HOSPITAL_COMMUNITY)
Admission: RE | Admit: 2017-05-01 | Discharge: 2017-05-01 | Disposition: A | Discharge status: Home / Self Care | Payer: Medicare Other | Source: Ambulatory Visit | Attending: Cardiovascular Disease | Admitting: Cardiovascular Disease

## 2017-05-03 ENCOUNTER — Encounter (HOSPITAL_COMMUNITY): Payer: Medicare Other

## 2017-05-08 ENCOUNTER — Encounter (HOSPITAL_COMMUNITY): Payer: Medicare Other

## 2017-05-08 DIAGNOSIS — I2510 Atherosclerotic heart disease of native coronary artery without angina pectoris: Secondary | ICD-10-CM | POA: Insufficient documentation

## 2017-05-10 ENCOUNTER — Encounter (HOSPITAL_COMMUNITY): Payer: Medicare Other

## 2017-05-15 ENCOUNTER — Encounter (HOSPITAL_COMMUNITY)
Admission: RE | Admit: 2017-05-15 | Discharge: 2017-05-15 | Disposition: A | Discharge status: Home / Self Care | Payer: Medicare Other | Source: Ambulatory Visit | Attending: Cardiovascular Disease | Admitting: Cardiovascular Disease

## 2017-05-17 ENCOUNTER — Encounter (HOSPITAL_COMMUNITY)
Admission: RE | Admit: 2017-05-17 | Discharge: 2017-05-17 | Disposition: A | Discharge status: Home / Self Care | Payer: Medicare Other | Source: Ambulatory Visit | Attending: Cardiovascular Disease | Admitting: Cardiovascular Disease

## 2017-05-22 ENCOUNTER — Encounter (HOSPITAL_COMMUNITY): Payer: Medicare Other

## 2017-05-24 ENCOUNTER — Encounter (HOSPITAL_COMMUNITY)
Admission: RE | Admit: 2017-05-24 | Discharge: 2017-05-24 | Disposition: A | Discharge status: Home / Self Care | Payer: Medicare Other | Source: Ambulatory Visit | Attending: Cardiovascular Disease | Admitting: Cardiovascular Disease

## 2017-05-29 ENCOUNTER — Encounter (HOSPITAL_COMMUNITY)
Admission: RE | Admit: 2017-05-29 | Discharge: 2017-05-29 | Disposition: A | Discharge status: Home / Self Care | Payer: Medicare Other | Source: Ambulatory Visit | Attending: Cardiovascular Disease | Admitting: Cardiovascular Disease

## 2017-05-31 ENCOUNTER — Encounter (HOSPITAL_COMMUNITY): Payer: Medicare Other

## 2017-06-05 ENCOUNTER — Encounter (HOSPITAL_COMMUNITY): Payer: Medicare Other

## 2017-06-07 ENCOUNTER — Encounter (HOSPITAL_COMMUNITY): Payer: Medicare Other

## 2017-06-07 DIAGNOSIS — I2510 Atherosclerotic heart disease of native coronary artery without angina pectoris: Secondary | ICD-10-CM | POA: Insufficient documentation

## 2017-06-12 ENCOUNTER — Encounter (HOSPITAL_COMMUNITY): Payer: Medicare Other

## 2017-06-14 ENCOUNTER — Encounter (HOSPITAL_COMMUNITY): Payer: Medicare Other

## 2017-06-19 ENCOUNTER — Encounter (HOSPITAL_COMMUNITY): Payer: Medicare Other

## 2017-06-21 ENCOUNTER — Encounter (HOSPITAL_COMMUNITY): Payer: Medicare Other

## 2017-06-26 ENCOUNTER — Encounter (HOSPITAL_COMMUNITY)
Admission: RE | Admit: 2017-06-26 | Discharge: 2017-06-26 | Disposition: A | Discharge status: Home / Self Care | Payer: Medicare Other | Source: Ambulatory Visit | Attending: Cardiovascular Disease | Admitting: Cardiovascular Disease

## 2017-06-28 ENCOUNTER — Encounter (HOSPITAL_COMMUNITY)
Admission: RE | Admit: 2017-06-28 | Discharge: 2017-06-28 | Disposition: A | Discharge status: Home / Self Care | Payer: Medicare Other | Source: Ambulatory Visit | Attending: Cardiovascular Disease | Admitting: Cardiovascular Disease

## 2017-07-03 ENCOUNTER — Encounter (HOSPITAL_COMMUNITY): Payer: Medicare Other

## 2017-07-05 ENCOUNTER — Encounter (HOSPITAL_COMMUNITY): Payer: Medicare Other

## 2017-07-10 ENCOUNTER — Encounter (HOSPITAL_COMMUNITY)
Admission: RE | Admit: 2017-07-10 | Discharge: 2017-07-10 | Disposition: A | Discharge status: Home / Self Care | Payer: Medicare Other | Source: Ambulatory Visit | Attending: Cardiovascular Disease | Admitting: Cardiovascular Disease

## 2017-07-10 DIAGNOSIS — I2510 Atherosclerotic heart disease of native coronary artery without angina pectoris: Secondary | ICD-10-CM | POA: Insufficient documentation

## 2017-07-31 ENCOUNTER — Encounter (HOSPITAL_COMMUNITY)
Admission: RE | Admit: 2017-07-31 | Discharge: 2017-07-31 | Disposition: A | Discharge status: Home / Self Care | Payer: Medicare Other | Source: Ambulatory Visit | Attending: Cardiovascular Disease | Admitting: Cardiovascular Disease

## 2017-08-07 ENCOUNTER — Encounter (HOSPITAL_COMMUNITY)
Admission: RE | Admit: 2017-08-07 | Discharge: 2017-08-07 | Disposition: A | Discharge status: Home / Self Care | Payer: Medicare Other | Source: Ambulatory Visit | Attending: Cardiovascular Disease | Admitting: Cardiovascular Disease

## 2017-08-07 DIAGNOSIS — I2510 Atherosclerotic heart disease of native coronary artery without angina pectoris: Secondary | ICD-10-CM | POA: Insufficient documentation

## 2017-08-16 ENCOUNTER — Encounter (HOSPITAL_COMMUNITY): Payer: Medicare Other

## 2017-08-21 ENCOUNTER — Encounter (HOSPITAL_COMMUNITY)
Admission: RE | Admit: 2017-08-21 | Discharge: 2017-08-21 | Disposition: A | Discharge status: Home / Self Care | Payer: Medicare Other | Source: Ambulatory Visit | Attending: Cardiovascular Disease | Admitting: Cardiovascular Disease

## 2017-08-23 ENCOUNTER — Encounter (HOSPITAL_COMMUNITY): Payer: Medicare Other

## 2017-08-28 ENCOUNTER — Encounter (HOSPITAL_COMMUNITY)
Admission: RE | Admit: 2017-08-28 | Discharge: 2017-08-28 | Disposition: A | Discharge status: Home / Self Care | Payer: Medicare Other | Source: Ambulatory Visit | Attending: Cardiovascular Disease | Admitting: Cardiovascular Disease

## 2017-08-30 ENCOUNTER — Encounter (HOSPITAL_COMMUNITY)
Admission: RE | Admit: 2017-08-30 | Discharge: 2017-08-30 | Disposition: A | Discharge status: Home / Self Care | Payer: Medicare Other | Source: Ambulatory Visit | Attending: Cardiovascular Disease | Admitting: Cardiovascular Disease

## 2017-09-04 ENCOUNTER — Encounter (HOSPITAL_COMMUNITY)
Admission: RE | Admit: 2017-09-04 | Discharge: 2017-09-04 | Disposition: A | Discharge status: Home / Self Care | Payer: Medicare Other | Source: Ambulatory Visit | Attending: Cardiovascular Disease | Admitting: Cardiovascular Disease

## 2017-09-06 ENCOUNTER — Encounter (HOSPITAL_COMMUNITY): Payer: Medicare Other

## 2017-09-06 DIAGNOSIS — I2510 Atherosclerotic heart disease of native coronary artery without angina pectoris: Secondary | ICD-10-CM | POA: Insufficient documentation

## 2017-09-11 ENCOUNTER — Encounter (HOSPITAL_COMMUNITY): Payer: Medicare Other

## 2017-09-11 DIAGNOSIS — I2510 Atherosclerotic heart disease of native coronary artery without angina pectoris: Secondary | ICD-10-CM | POA: Diagnosis not present

## 2017-09-13 ENCOUNTER — Encounter (HOSPITAL_COMMUNITY): Payer: Medicare Other

## 2017-09-18 ENCOUNTER — Encounter (HOSPITAL_COMMUNITY): Payer: Medicare Other

## 2017-09-20 ENCOUNTER — Encounter (HOSPITAL_COMMUNITY): Payer: Medicare Other

## 2017-09-25 ENCOUNTER — Encounter (HOSPITAL_COMMUNITY): Payer: Medicare Other

## 2017-10-02 ENCOUNTER — Encounter (HOSPITAL_COMMUNITY)
Admission: RE | Admit: 2017-10-02 | Discharge: 2017-10-02 | Disposition: A | Discharge status: Home / Self Care | Payer: Medicare Other | Source: Ambulatory Visit | Attending: Cardiovascular Disease | Admitting: Cardiovascular Disease

## 2017-10-03 ENCOUNTER — Inpatient Hospital Stay (HOSPITAL_COMMUNITY)
Admission: EM | Admit: 2017-10-03 | Discharge: 2017-10-06 | DRG: 069 | Disposition: A | Discharge status: Home / Self Care | Payer: Medicare Other | Attending: Internal Medicine | Admitting: Internal Medicine

## 2017-10-03 ENCOUNTER — Other Ambulatory Visit: Payer: Self-pay

## 2017-10-03 ENCOUNTER — Emergency Department (HOSPITAL_COMMUNITY): Payer: Medicare Other

## 2017-10-03 ENCOUNTER — Encounter (HOSPITAL_COMMUNITY): Payer: Self-pay | Admitting: *Deleted

## 2017-10-03 DIAGNOSIS — R51 Headache: Secondary | ICD-10-CM

## 2017-10-03 DIAGNOSIS — I10 Essential (primary) hypertension: Secondary | ICD-10-CM | POA: Diagnosis present

## 2017-10-03 DIAGNOSIS — I671 Cerebral aneurysm, nonruptured: Secondary | ICD-10-CM | POA: Diagnosis not present

## 2017-10-03 DIAGNOSIS — M5030 Other cervical disc degeneration, unspecified cervical region: Secondary | ICD-10-CM | POA: Diagnosis present

## 2017-10-03 DIAGNOSIS — I252 Old myocardial infarction: Secondary | ICD-10-CM

## 2017-10-03 DIAGNOSIS — I7774 Dissection of vertebral artery: Secondary | ICD-10-CM | POA: Diagnosis not present

## 2017-10-03 DIAGNOSIS — F4310 Post-traumatic stress disorder, unspecified: Secondary | ICD-10-CM | POA: Diagnosis present

## 2017-10-03 DIAGNOSIS — R262 Difficulty in walking, not elsewhere classified: Secondary | ICD-10-CM | POA: Diagnosis present

## 2017-10-03 DIAGNOSIS — F17210 Nicotine dependence, cigarettes, uncomplicated: Secondary | ICD-10-CM | POA: Diagnosis present

## 2017-10-03 DIAGNOSIS — Z833 Family history of diabetes mellitus: Secondary | ICD-10-CM

## 2017-10-03 DIAGNOSIS — I672 Cerebral atherosclerosis: Secondary | ICD-10-CM | POA: Diagnosis present

## 2017-10-03 DIAGNOSIS — D649 Anemia, unspecified: Secondary | ICD-10-CM | POA: Diagnosis present

## 2017-10-03 DIAGNOSIS — Z951 Presence of aortocoronary bypass graft: Secondary | ICD-10-CM

## 2017-10-03 DIAGNOSIS — I63212 Cerebral infarction due to unspecified occlusion or stenosis of left vertebral arteries: Secondary | ICD-10-CM

## 2017-10-03 DIAGNOSIS — E7800 Pure hypercholesterolemia, unspecified: Secondary | ICD-10-CM | POA: Diagnosis present

## 2017-10-03 DIAGNOSIS — Z955 Presence of coronary angioplasty implant and graft: Secondary | ICD-10-CM

## 2017-10-03 DIAGNOSIS — G450 Vertebro-basilar artery syndrome: Secondary | ICD-10-CM | POA: Diagnosis not present

## 2017-10-03 DIAGNOSIS — Z9841 Cataract extraction status, right eye: Secondary | ICD-10-CM

## 2017-10-03 DIAGNOSIS — R410 Disorientation, unspecified: Secondary | ICD-10-CM | POA: Diagnosis not present

## 2017-10-03 DIAGNOSIS — R519 Headache, unspecified: Secondary | ICD-10-CM

## 2017-10-03 DIAGNOSIS — I7771 Dissection of carotid artery: Secondary | ICD-10-CM | POA: Diagnosis not present

## 2017-10-03 DIAGNOSIS — E876 Hypokalemia: Secondary | ICD-10-CM | POA: Diagnosis not present

## 2017-10-03 DIAGNOSIS — R269 Unspecified abnormalities of gait and mobility: Secondary | ICD-10-CM

## 2017-10-03 DIAGNOSIS — I6523 Occlusion and stenosis of bilateral carotid arteries: Secondary | ICD-10-CM | POA: Diagnosis not present

## 2017-10-03 DIAGNOSIS — Z7982 Long term (current) use of aspirin: Secondary | ICD-10-CM

## 2017-10-03 DIAGNOSIS — R2689 Other abnormalities of gait and mobility: Secondary | ICD-10-CM | POA: Diagnosis not present

## 2017-10-03 DIAGNOSIS — Z9842 Cataract extraction status, left eye: Secondary | ICD-10-CM

## 2017-10-03 DIAGNOSIS — Z7902 Long term (current) use of antithrombotics/antiplatelets: Secondary | ICD-10-CM

## 2017-10-03 DIAGNOSIS — E860 Dehydration: Secondary | ICD-10-CM | POA: Diagnosis not present

## 2017-10-03 DIAGNOSIS — E785 Hyperlipidemia, unspecified: Secondary | ICD-10-CM | POA: Diagnosis present

## 2017-10-03 DIAGNOSIS — I110 Hypertensive heart disease with heart failure: Secondary | ICD-10-CM | POA: Diagnosis present

## 2017-10-03 DIAGNOSIS — K219 Gastro-esophageal reflux disease without esophagitis: Secondary | ICD-10-CM | POA: Diagnosis present

## 2017-10-03 DIAGNOSIS — N400 Enlarged prostate without lower urinary tract symptoms: Secondary | ICD-10-CM | POA: Diagnosis present

## 2017-10-03 DIAGNOSIS — I5031 Acute diastolic (congestive) heart failure: Secondary | ICD-10-CM | POA: Diagnosis present

## 2017-10-03 DIAGNOSIS — I255 Ischemic cardiomyopathy: Secondary | ICD-10-CM | POA: Diagnosis present

## 2017-10-03 DIAGNOSIS — I2510 Atherosclerotic heart disease of native coronary artery without angina pectoris: Secondary | ICD-10-CM | POA: Diagnosis present

## 2017-10-03 DIAGNOSIS — Z961 Presence of intraocular lens: Secondary | ICD-10-CM | POA: Diagnosis present

## 2017-10-03 DIAGNOSIS — Z8249 Family history of ischemic heart disease and other diseases of the circulatory system: Secondary | ICD-10-CM

## 2017-10-03 HISTORY — DX: Vertebro-basilar artery syndrome: G45.0

## 2017-10-03 LAB — COMPREHENSIVE METABOLIC PANEL
ALBUMIN: 3.6 g/dL (ref 3.5–5.0)
ALK PHOS: 68 U/L (ref 38–126)
ALT: 20 U/L (ref 17–63)
ANION GAP: 8 (ref 5–15)
AST: 20 U/L (ref 15–41)
BILIRUBIN TOTAL: 0.2 mg/dL — AB (ref 0.3–1.2)
BUN: 23 mg/dL — AB (ref 6–20)
CALCIUM: 9 mg/dL (ref 8.9–10.3)
CO2: 25 mmol/L (ref 22–32)
Chloride: 105 mmol/L (ref 101–111)
Creatinine, Ser: 0.94 mg/dL (ref 0.61–1.24)
GFR calc Af Amer: >60 mL/min (ref >60)
GLUCOSE: 87 mg/dL (ref 65–99)
Potassium: 3.3 mmol/L — ABNORMAL LOW (ref 3.5–5.1)
Sodium: 138 mmol/L (ref 135–145)
TOTAL PROTEIN: 6.5 g/dL (ref 6.5–8.1)

## 2017-10-03 LAB — DIFFERENTIAL
Basophils Absolute: 0 10*3/uL (ref 0.0–0.1)
Basophils Relative: 0 %
EOS ABS: 0.1 10*3/uL (ref 0.0–0.7)
EOS PCT: 1 %
LYMPHS ABS: 2.7 10*3/uL (ref 0.7–4.0)
LYMPHS PCT: 27 %
MONOS PCT: 11 %
Monocytes Absolute: 1.1 10*3/uL — ABNORMAL HIGH (ref 0.1–1.0)
NEUTROS PCT: 61 %
Neutro Abs: 6.3 10*3/uL (ref 1.7–7.7)

## 2017-10-03 LAB — CBC
HEMATOCRIT: 41.3 % (ref 39.0–52.0)
HEMOGLOBIN: 14.3 g/dL (ref 13.0–17.0)
MCH: 33.5 pg (ref 26.0–34.0)
MCHC: 34.6 g/dL (ref 30.0–36.0)
MCV: 96.7 fL (ref 78.0–100.0)
Platelets: 170 10*3/uL (ref 150–400)
RBC: 4.27 MIL/uL (ref 4.22–5.81)
RDW: 13 % (ref 11.5–15.5)
WBC: 10.2 10*3/uL (ref 4.0–10.5)

## 2017-10-03 LAB — I-STAT CHEM 8, ED
BUN: 24 mg/dL — AB (ref 6–20)
CALCIUM ION: 1.11 mmol/L — AB (ref 1.15–1.40)
CREATININE: 0.8 mg/dL (ref 0.61–1.24)
Chloride: 104 mmol/L (ref 101–111)
Glucose, Bld: 88 mg/dL (ref 65–99)
HCT: 43 % (ref 39.0–52.0)
HEMOGLOBIN: 14.6 g/dL (ref 13.0–17.0)
Potassium: 3.6 mmol/L (ref 3.5–5.1)
SODIUM: 143 mmol/L (ref 135–145)
TCO2: 26 mmol/L (ref 22–32)

## 2017-10-03 LAB — I-STAT TROPONIN, ED: TROPONIN I, POC: 0 ng/mL (ref 0.00–0.08)

## 2017-10-03 LAB — PROTIME-INR
INR: 1.02
Prothrombin Time: 13.3 seconds (ref 11.4–15.2)

## 2017-10-03 LAB — APTT: aPTT: 31 seconds (ref 24–36)

## 2017-10-03 MED ORDER — PROCHLORPERAZINE EDISYLATE 5 MG/ML IJ SOLN
10.0000 mg | Freq: Once | INTRAMUSCULAR | Status: AC
  Administered 2017-10-03: 10 mg via INTRAVENOUS
  Filled 2017-10-03: qty 2

## 2017-10-03 MED ORDER — SODIUM CHLORIDE 0.9 % IV BOLUS (SEPSIS)
500.0000 mL | Freq: Once | INTRAVENOUS | Status: AC
  Administered 2017-10-03: 500 mL via INTRAVENOUS

## 2017-10-03 NOTE — ED Triage Notes (Signed)
Staggering gait for 2 weeks  With a headache for 3-4 days confusion at times according to the family

## 2017-10-03 NOTE — ED Provider Notes (Signed)
[facility] EMERGENCY DEPARTMENT Provider Note   CSN: [id number] Arrival date & time: 10/03/17  2952     History   Chief Complaint Chief Complaint  Patient presents with  . Headache    HPI [name] is a 71 y.o. male with a hx of CAD s/p CABG x3 in 2013, current smoker, HTN, BPH, Acute diastolic heart failure, anemia, posttraumatic stress disorder presents to the Emergency Department complaining of intermittent, progressively worsening episodes of gait disturbance, confusion, blurred vision onset proximately 5 days ago.  Patient reports that 3 days ago he developed a sudden onset left-sided headache that has waxed and waned but has not resolved.  He states no history of migraine headache and he does not routinely have headaches.  He also reports several months of severe fatigue.  Patient significant other is at bedside and reports episodes of confusion where he is unable to answer her questions or seems confused about why he has gone someplace.  She reports at times when he tries to walk he looks like "a pinball" bouncing off the walls.  Both significant other and patient denies syncope episodes falls or known head injuries.  Nothing seems to make the symptoms better or worse.  Patient reports that there is no room spinning and none of his symptoms appear to be positional.  Patient denies fevers or chills, chest pain, shortness of breath, syncope, diarrhea, leg swelling.   The history is provided by the patient, a significant other, a relative and medical records. No language interpreter was used.    Past Medical History:  Diagnosis Date  . Anginal pain ([place])   . Arthritis    "in my back and neck" (August 30, 2012)  . CAD, CABG X 09 Nov 2011. re-do CABG X1 09/19/12 12/04/2011  . Coronary artery disease   . DDD (degenerative disc disease), cervical   . Hypercholesterolemia   . Hypertension   . Malaria    "I've had it a couple times in the 1960's; almost died from  it" (Aug 30, 2012)  . NSTEMI (non-ST elevated myocardial infarction) ([place]) 11/2011   anterior  . NSTEMI (non-ST elevated myocardial infarction) ([place]) 30-Aug-2012  . PTSD (post-traumatic stress disorder)   . S/P angioplasty with stent,  10/31/12 VG to OM that was new graft from 11/13 in setting of NSTEMI 05/05/2013    Patient Active Problem List   Diagnosis Date Noted  . Coronary artery disease 02/16/2014  . Abnormal nuclear stress test 05/16/2013  . Angina effort ([place]) 05/05/2013  . S/P angioplasty with stent,  10/31/12 VG to OM that was new graft from 11/13 in setting of NSTEMI, NOW 05/16/13 with New DES stent to OM VG of ostium and distal portion 05/05/2013  . Stress disorder, post traumatic, from experiences in [place], controlled 05/05/2013  . Stable angina ([place]) 04/22/2013  . Dyspnea on exertion 03/25/2013  . Anxiety as acute reaction to exceptional stress 10/30/2012    Class: Question of  . Hx of agent Orange exposure, he is followed at the [facility] 10/24/2012  . Anemia, heme negative 10/06/12 10/06/2012  . Acute coronary syndrome  10/05/2012  . Acute diastolic HF (heart failure) - likely cause of ACS; resolved 10/05/2012  . Dyslipidemia 09/20/2012  . BPH (benign prostatic hyperplasia) 09/20/2012  . Nasal sinus congestion 09/13/2012  . NSTEMI, 3d episode this month 2012-08-30  . Abnormal EKG, marked diffuse downsloping ST depression,ST elevation AVR and V1 12/05/2011  . Acute MI,( only one POC marker drawn-0.9) 12/05/2011  .  Former smoker 12/05/2011  . HTN (hypertension) 12/05/2011  . ICM, EF 40% Jan 2013,  50-55% Oct 2013,  40-45% with apical infarct 10/18/12 12/05/2011  . S/P CABG x 3 11/2011: LIMA TO LAD, SVG TO OM, SVG TO RCA., VG to OM occluded, jeopardized LCX, by cath 08/29/12 -- Re-Do CABG with SVG-OM (09/2012) 12/04/2011    Past Surgical History:  Procedure Laterality Date  . BACK SURGERY    . CARDIAC CATHETERIZATION  11/2011  . CATARACT EXTRACTION W/ INTRAOCULAR LENS  IMPLANT,  BILATERAL  1990's  . CORONARY ANGIOPLASTY  10/2012   stent placed SVG to OM   . CORONARY ARTERY BYPASS GRAFT  12/02/2011   Procedure: CORONARY ARTERY BYPASS GRAFTING (CABG);  Surgeon: Gaye Pollack, MD;  Location: [place] [facility];  Service: Open Heart Surgery;  Laterality: N/A;  . CORONARY ARTERY BYPASS GRAFT  09/19/2012   Procedure: REDO CORONARY ARTERY BYPASS GRAFTING (CABG);  Surgeon: Gaye Pollack, MD;  Location: [facility];  Service: Open Heart Surgery;  Laterality: N/A;  . LEFT HEART CATHETERIZATION WITH CORONARY ANGIOGRAM N/A 12/02/2011   Procedure: LEFT HEART CATHETERIZATION WITH CORONARY ANGIOGRAM;  Surgeon: Lorretta Harp, MD;  Location: [facility] CATH LAB;  Service: Cardiovascular;  Laterality: N/A;  . LEFT HEART CATHETERIZATION WITH CORONARY ANGIOGRAM N/A 08/30/2012   Procedure: LEFT HEART CATHETERIZATION WITH CORONARY ANGIOGRAM;  Surgeon: Troy Sine, MD;  Location: [facility] CATH LAB;  Service: Cardiovascular;  Laterality: N/A;  . LEFT HEART CATHETERIZATION WITH CORONARY ANGIOGRAM N/A 09/11/2012   Procedure: LEFT HEART CATHETERIZATION WITH CORONARY ANGIOGRAM;  Surgeon: Troy Sine, MD;  Location: [facility] CATH LAB;  Service: Cardiovascular;  Laterality: N/A;  . LEFT HEART CATHETERIZATION WITH CORONARY ANGIOGRAM N/A 05/16/2013   Procedure: LEFT HEART CATHETERIZATION WITH CORONARY ANGIOGRAM;  Surgeon: Lorretta Harp, MD;  Location: [facility] CATH LAB;  Service: Cardiovascular;  Laterality: N/A;  . LEFT HEART CATHETERIZATION WITH CORONARY/GRAFT ANGIOGRAM  08/30/2012   Procedure: LEFT HEART CATHETERIZATION WITH Beatrix Fetters;  Surgeon: Troy Sine, MD;  Location: [facility] CATH LAB;  Service: Cardiovascular;;  . LEFT HEART CATHETERIZATION WITH CORONARY/GRAFT ANGIOGRAM N/A 10/04/2012   Procedure: LEFT HEART CATHETERIZATION WITH Beatrix Fetters;  Surgeon: Leonie Man, MD;  Location: [facility] CATH LAB;  Service: Cardiovascular;  Laterality: N/A;  . LEFT HEART CATHETERIZATION WITH CORONARY/GRAFT ANGIOGRAM N/A  10/17/2012   Procedure: LEFT HEART CATHETERIZATION WITH Beatrix Fetters;  Surgeon: Leonie Man, MD;  Location: [facility] CATH LAB;  Service: Cardiovascular;  Laterality: N/A;  . LUMBAR DISC SURGERY  1980's?  . PERCUTANEOUS CORONARY STENT INTERVENTION (PCI-S) N/A 10/31/2012   Procedure: PERCUTANEOUS CORONARY STENT INTERVENTION (PCI-S);  Surgeon: Lorretta Harp, MD;  Location: [facility] CATH LAB;  Service: Cardiovascular;  Laterality: N/A;  . TONSILLECTOMY     "as a kid" (08/29/2012)  . TRANSTHORACIC ECHOCARDIOGRAM  10/18/2012   LV EF 40%- 45%   . US ECHOCARDIOGRAPHY  02/29/2012       Home Medications    Prior to Admission medications   Medication Sig Start Date End Date Taking? Authorizing Provider  amLODipine (NORVASC) 5 MG tablet Take 1 tablet (5 mg total) by mouth daily. 10/25/12  Yes Erlene Quan, PA-C  aspirin EC 325 MG EC tablet Take 1 tablet (325 mg total) by mouth daily. 11/01/12  Yes Brett Canales, PA-C  atorvastatin (LIPITOR) 40 MG tablet Take 20 mg by mouth daily.   Yes [provider]  carvedilol (COREG) 3.125 MG tablet Take 3.125 mg by mouth 2 (two) times  daily with a meal.   Yes [provider]  clopidogrel (PLAVIX) 75 MG tablet Take 1 tablet (75 mg total) by mouth daily. 08/05/14  Yes Lorretta Harp, MD  doxycycline (VIBRA-TABS) 100 MG tablet Take 100 mg by mouth 2 (two) times daily.   Yes [provider]  ferrous sulfate 325 (65 FE) MG tablet Take 1 tablet (325 mg total) by mouth 3 (three) times daily with meals. Patient taking differently: Take 325 mg by mouth 2 (two) times daily with a meal.  10/25/12  Yes Kilroy, Luke K, PA-C  finasteride (PROSCAR) 5 MG tablet Take 5 mg by mouth daily.   Yes [provider]  hydrochlorothiazide (HYDRODIURIL) 25 MG tablet Take 25 mg by mouth daily.   Yes [provider]  ibuprofen (ADVIL,MOTRIN) 400 MG tablet Take 400 mg by mouth every 6 (six) hours as needed for mild pain.   Yes  [provider]  isosorbide mononitrate (IMDUR) 30 MG 24 hr tablet Take 1 tablet (30 mg total) by mouth daily. 04/23/13  Yes Croitoru, Mihai, MD  lidocaine (LIDODERM) 5 % Place 1 patch onto the skin daily. Remove & Discard patch within 12 hours or as directed by MD   Yes [provider]  nitroGLYCERIN (NITROSTAT) 0.4 MG SL tablet Place 1 tablet (0.4 mg total) under the tongue every 5 (five) minutes x 3 doses as needed for chest pain. 10/07/12  Yes Brett Canales, PA-C  omeprazole (PRILOSEC) 20 MG capsule Take 20 mg by mouth 2 (two) times daily.   Yes [provider]  potassium chloride SA (K-DUR,KLOR-CON) 20 MEQ tablet Take 20 mEq by mouth 2 (two) times daily.   Yes [provider]  terazosin (HYTRIN) 10 MG capsule Take 10 mg by mouth at bedtime.   Yes [provider]  traMADol (ULTRAM) 50 MG tablet Take 50-100 mg by mouth 3 (three) times daily.   Yes [provider]  oxyCODONE-acetaminophen (PERCOCET) 10-325 MG per tablet Take 1 tablet by mouth every 6 (six) hours as needed for pain. Patient not taking: Reported on 10/03/2017 03/09/14   Delos Haring, PA-C  tamsulosin (FLOMAX) 0.4 MG CAPS capsule Take 1 capsule (0.4 mg total) by mouth daily. Patient not taking: Reported on 10/03/2017 03/09/14   Delos Haring, PA-C    Family History Family History  Problem Relation Age of Onset  . Heart disease Mother   . Diabetes Mother   . Heart disease Father   . Diabetes Father   . Healthy Sister   . Diabetes Brother   . Diabetes Sister   . Diabetes Brother     Social History Social History   Tobacco Use  . Smoking status: Former Smoker    Packs/day: 0.50    Years: 50.00    Pack years: 25.00    Types: Cigarettes    Last attempt to quit: 12/02/2011    Years since quitting: 5.8  . Smokeless tobacco: Never Used  Substance Use Topics  . Alcohol use: No    Comment: 08/29/2012 "haven't had a drink in > 25 years; never had problem w/it"  . Drug  use: No     Allergies   Patient has no known allergies.   Review of Systems Review of Systems  Constitutional: Positive for fatigue ( for many months). Negative for appetite change, diaphoresis, fever and unexpected weight change.  HENT: Negative for congestion, ear pain and mouth sores.   Eyes: Positive for visual disturbance ( blurred). Negative for  photophobia.  Respiratory: Negative for cough, chest tightness, shortness of breath and wheezing.   Cardiovascular: Positive for palpitations ( occasionally, lasting 15 sec and resolving). Negative for chest pain and leg swelling.  Gastrointestinal: Negative for abdominal pain, constipation, diarrhea, nausea and vomiting.  Endocrine: Negative for polydipsia, polyphagia and polyuria.  Genitourinary: Positive for dysuria (intermittent). Negative for frequency, hematuria and urgency.  Musculoskeletal: Positive for gait problem. Negative for back pain and neck stiffness.  Skin: Negative for rash and wound.  Allergic/Immunologic: Negative for immunocompromised state.  Neurological: Positive for headaches. Negative for syncope and light-headedness.  Hematological: Does not bruise/bleed easily.  Psychiatric/Behavioral: Positive for confusion ( intermittent). Negative for sleep disturbance. The patient is nervous/anxious.      Physical Exam Updated Vital Signs BP (!) 179/86   Pulse (!) 50   Temp 98.2 F (36.8 C)   Resp (!) 1   Ht 5\' 10"  (1.778 m)   Wt 80.7 kg (178 lb)   SpO2 99%   BMI 25.54 kg/m   Physical Exam  Constitutional: He is oriented to person, place, and time. He appears well-developed and well-nourished. No distress.  Awake, alert, nontoxic appearance  HENT:  Head: Normocephalic and atraumatic.  Mouth/Throat: Oropharynx is clear and moist. No oropharyngeal exudate.  Eyes: Conjunctivae and EOM are normal. Pupils are equal, round, and reactive to light. No scleral icterus.  No horizontal, vertical or rotational nystagmus    Neck: Normal range of motion. Neck supple.  Full active and passive ROM without pain No midline or paraspinal tenderness No nuchal rigidity or meningeal signs  Cardiovascular: Normal rate, regular rhythm and intact distal pulses.  Pulmonary/Chest: Effort normal and breath sounds normal. No respiratory distress. He has no wheezes. He has no rales.  Equal chest expansion  Abdominal: Soft. Bowel sounds are normal. He exhibits no mass. There is no tenderness. There is no rebound and no guarding.  Musculoskeletal: Normal range of motion. He exhibits no edema.  Lymphadenopathy:    He has no cervical adenopathy.  Neurological: He is alert and oriented to person, place, and time. No cranial nerve deficit. He exhibits normal muscle tone. Gait ( unsteady, no ataxia) abnormal. Coordination normal. GCS eye subscore is 4. GCS verbal subscore is 5. GCS motor subscore is 6.  Mental Status:  Alert, oriented, thought content appropriate. Speech fluent without evidence of aphasia. Able to follow 2 step commands without difficulty.  Cranial Nerves:  II:  Peripheral visual fields grossly normal, pupils equal, round, reactive to light III,IV, VI: ptosis not present, extra-ocular motions intact bilaterally  V,VII: smile symmetric, facial light touch sensation equal VIII: hearing grossly normal bilaterally  IX,X: midline uvula rise  XI: bilateral shoulder shrug equal and strong XII: midline tongue extension  Motor:  5/5 in upper and lower extremities bilaterally including strong and equal grip strength and dorsiflexion/plantar flexion Sensory: Pinprick and light touch normal in all extremities.  Cerebellar: normal finger-to-nose with bilateral upper extremities Gait: unsteady gait without ataxia or wide based CV: distal pulses palpable throughout   Skin: Skin is warm and dry. No rash noted. He is not diaphoretic.  Psychiatric: His behavior is normal. Judgment and thought content normal. His mood appears  anxious.  Pt tearful, reporting he is very worried about the current situation  Nursing note and vitals reviewed.    ED Treatments / Results  Labs (all labs ordered are listed, but only abnormal results are displayed) Labs Reviewed  DIFFERENTIAL - Abnormal; Notable for the following components:  Result Value   Monocytes Absolute 1.1 (*)    All other components within normal limits  COMPREHENSIVE METABOLIC PANEL - Abnormal; Notable for the following components:   Potassium 3.3 (*)    BUN 23 (*)    Total Bilirubin 0.2 (*)    All other components within normal limits  I-STAT CHEM 8, ED - Abnormal; Notable for the following components:   BUN 24 (*)    Calcium, Ion 1.11 (*)    All other components within normal limits  PROTIME-INR  APTT  CBC  AMMONIA  URINALYSIS, ROUTINE W REFLEX MICROSCOPIC  I-STAT TROPONIN, ED    EKG  EKG Interpretation  Date/Time:  Wednesday October 03 2017 20:11:31 EST Ventricular Rate:  56 PR Interval:  166 QRS Duration: 88 QT Interval:  478 QTC Calculation: 461 R Axis:   40 Text Interpretation:  Sinus bradycardia with occasional Premature ventricular complexes Possible Left atrial enlargement No significant change since last tracing Confirmed by Blanchie Dessert [phone]) on 10/03/2017 10:20:18 PM       Radiology Ct Head Wo Contrast  Result Date: 10/03/2017 CLINICAL DATA:  Headache EXAM: CT HEAD WITHOUT CONTRAST TECHNIQUE: Contiguous axial images were obtained from the base of the skull through the vertex without intravenous contrast. COMPARISON:  None. FINDINGS: Brain: No acute territorial infarction, hemorrhage or intracranial mass is visualized. Mild atrophy. Ventricle size within normal limits Vascular: No hyperdense vessels. Carotid vessel calcification. Calcifications in the left vertebral artery Skull: Normal. Negative for fracture or focal lesion. Sinuses/Orbits: Mucosal thickening in the maxillary and ethmoid sinuses. No acute orbital  abnormality. Other: None IMPRESSION: No CT evidence for acute intracranial abnormality. Electronically Signed   By: Donavan Foil M.D.   On: 10/03/2017 20:54    Procedures Procedures (including critical care time)  Medications Ordered in ED Medications  sodium chloride 0.9 % bolus 500 mL (500 mLs Intravenous New Bag/Given 10/03/17 2336)  prochlorperazine (COMPAZINE) injection 10 mg (10 mg Intravenous Given 10/03/17 2336)     Initial Impression / Assessment and Plan / ED Course  I have reviewed the triage vital signs and the nursing notes.  Pertinent labs & imaging results that were available during my care of the patient were reviewed by me and considered in my medical decision making (see chart for details).  Clinical Course as of Oct 04 54  Wed Oct 03, 2017  2245 Pt reports hx of HTN often with SBPs in the 170s BP: (!) 179/86 [HM]  2329 Discussed with Dr. Hal Hope who will admit  [HM]  Thu Oct 04, 2017  0053 Discussed with Dr. Leonel Ramsay who recommends MRI, MRV.  He will evaluate  [HM]    Clinical Course User Index [HM] Andreya Lacks, Gwenlyn Perking    Resents with intermittent confusion, gait disturbance and headache.  Neurologic exam with gait instability here but no ataxia.  Symptoms are not worse with head movement or position change.  Concern for possible cerebellar etiology versus venous dural thrombosis.  Patient will have MRI, MRV and be admitted for further evaluation and treatment.  The patient was discussed with and seen by Dr. Maryan Rued who agrees with the treatment plan.   Final Clinical Impressions(s) / ED Diagnoses   Final diagnoses:  Left-sided headache  Gait disturbance  Confusion    ED Discharge Orders    None       Agapito Games 10/04/17 0055    Blanchie Dessert, MD 10/07/17 [phone]

## 2017-10-04 ENCOUNTER — Emergency Department (HOSPITAL_COMMUNITY): Payer: Medicare Other

## 2017-10-04 ENCOUNTER — Encounter (HOSPITAL_COMMUNITY): Payer: Medicare Other

## 2017-10-04 ENCOUNTER — Inpatient Hospital Stay (HOSPITAL_COMMUNITY): Payer: Medicare Other

## 2017-10-04 ENCOUNTER — Other Ambulatory Visit (HOSPITAL_COMMUNITY): Payer: Medicare Other

## 2017-10-04 ENCOUNTER — Encounter (HOSPITAL_COMMUNITY): Payer: Self-pay | Admitting: Internal Medicine

## 2017-10-04 DIAGNOSIS — I6523 Occlusion and stenosis of bilateral carotid arteries: Secondary | ICD-10-CM | POA: Diagnosis not present

## 2017-10-04 DIAGNOSIS — Z833 Family history of diabetes mellitus: Secondary | ICD-10-CM | POA: Diagnosis not present

## 2017-10-04 DIAGNOSIS — E860 Dehydration: Secondary | ICD-10-CM | POA: Diagnosis present

## 2017-10-04 DIAGNOSIS — R51 Headache: Secondary | ICD-10-CM | POA: Diagnosis not present

## 2017-10-04 DIAGNOSIS — R410 Disorientation, unspecified: Secondary | ICD-10-CM

## 2017-10-04 DIAGNOSIS — I7774 Dissection of vertebral artery: Secondary | ICD-10-CM

## 2017-10-04 DIAGNOSIS — Z961 Presence of intraocular lens: Secondary | ICD-10-CM | POA: Diagnosis present

## 2017-10-04 DIAGNOSIS — G450 Vertebro-basilar artery syndrome: Secondary | ICD-10-CM | POA: Diagnosis not present

## 2017-10-04 DIAGNOSIS — R262 Difficulty in walking, not elsewhere classified: Secondary | ICD-10-CM

## 2017-10-04 DIAGNOSIS — Z951 Presence of aortocoronary bypass graft: Secondary | ICD-10-CM

## 2017-10-04 DIAGNOSIS — I63212 Cerebral infarction due to unspecified occlusion or stenosis of left vertebral arteries: Secondary | ICD-10-CM | POA: Diagnosis not present

## 2017-10-04 DIAGNOSIS — I2510 Atherosclerotic heart disease of native coronary artery without angina pectoris: Secondary | ICD-10-CM | POA: Diagnosis present

## 2017-10-04 DIAGNOSIS — I255 Ischemic cardiomyopathy: Secondary | ICD-10-CM

## 2017-10-04 DIAGNOSIS — I5031 Acute diastolic (congestive) heart failure: Secondary | ICD-10-CM | POA: Diagnosis present

## 2017-10-04 DIAGNOSIS — Z9842 Cataract extraction status, left eye: Secondary | ICD-10-CM | POA: Diagnosis not present

## 2017-10-04 DIAGNOSIS — I252 Old myocardial infarction: Secondary | ICD-10-CM | POA: Diagnosis not present

## 2017-10-04 DIAGNOSIS — Z9841 Cataract extraction status, right eye: Secondary | ICD-10-CM | POA: Diagnosis not present

## 2017-10-04 DIAGNOSIS — F4310 Post-traumatic stress disorder, unspecified: Secondary | ICD-10-CM | POA: Diagnosis present

## 2017-10-04 DIAGNOSIS — I7771 Dissection of carotid artery: Secondary | ICD-10-CM | POA: Diagnosis present

## 2017-10-04 DIAGNOSIS — Z8249 Family history of ischemic heart disease and other diseases of the circulatory system: Secondary | ICD-10-CM | POA: Diagnosis not present

## 2017-10-04 DIAGNOSIS — M5030 Other cervical disc degeneration, unspecified cervical region: Secondary | ICD-10-CM | POA: Diagnosis present

## 2017-10-04 DIAGNOSIS — I671 Cerebral aneurysm, nonruptured: Secondary | ICD-10-CM | POA: Diagnosis not present

## 2017-10-04 DIAGNOSIS — I110 Hypertensive heart disease with heart failure: Secondary | ICD-10-CM | POA: Diagnosis present

## 2017-10-04 DIAGNOSIS — I10 Essential (primary) hypertension: Secondary | ICD-10-CM | POA: Diagnosis not present

## 2017-10-04 DIAGNOSIS — Z7982 Long term (current) use of aspirin: Secondary | ICD-10-CM | POA: Diagnosis not present

## 2017-10-04 DIAGNOSIS — Z955 Presence of coronary angioplasty implant and graft: Secondary | ICD-10-CM | POA: Diagnosis not present

## 2017-10-04 DIAGNOSIS — E7800 Pure hypercholesterolemia, unspecified: Secondary | ICD-10-CM | POA: Diagnosis present

## 2017-10-04 DIAGNOSIS — I340 Nonrheumatic mitral (valve) insufficiency: Secondary | ICD-10-CM | POA: Diagnosis not present

## 2017-10-04 DIAGNOSIS — M4802 Spinal stenosis, cervical region: Secondary | ICD-10-CM | POA: Diagnosis not present

## 2017-10-04 DIAGNOSIS — I6501 Occlusion and stenosis of right vertebral artery: Secondary | ICD-10-CM | POA: Diagnosis not present

## 2017-10-04 DIAGNOSIS — R269 Unspecified abnormalities of gait and mobility: Secondary | ICD-10-CM | POA: Diagnosis not present

## 2017-10-04 DIAGNOSIS — E785 Hyperlipidemia, unspecified: Secondary | ICD-10-CM | POA: Diagnosis present

## 2017-10-04 DIAGNOSIS — Z7902 Long term (current) use of antithrombotics/antiplatelets: Secondary | ICD-10-CM | POA: Diagnosis not present

## 2017-10-04 LAB — LIPID PANEL
CHOL/HDL RATIO: 4.1 ratio
CHOLESTEROL: 143 mg/dL (ref 0–200)
HDL: 35 mg/dL — ABNORMAL LOW (ref >40)
LDL Cholesterol: 89 mg/dL (ref 0–99)
TRIGLYCERIDES: 97 mg/dL (ref <150)
VLDL: 19 mg/dL (ref 0–40)

## 2017-10-04 LAB — CBC
HEMATOCRIT: 39.5 % (ref 39.0–52.0)
HEMOGLOBIN: 13.1 g/dL (ref 13.0–17.0)
MCH: 32.3 pg (ref 26.0–34.0)
MCHC: 33.2 g/dL (ref 30.0–36.0)
MCV: 97.5 fL (ref 78.0–100.0)
Platelets: 147 10*3/uL — ABNORMAL LOW (ref 150–400)
RBC: 4.05 MIL/uL — AB (ref 4.22–5.81)
RDW: 13 % (ref 11.5–15.5)
WBC: 8.1 10*3/uL (ref 4.0–10.5)

## 2017-10-04 LAB — CREATININE, SERUM
Creatinine, Ser: 0.85 mg/dL (ref 0.61–1.24)
GFR calc Af Amer: >60 mL/min (ref >60)
GFR calc non Af Amer: >60 mL/min (ref >60)

## 2017-10-04 LAB — URINALYSIS, ROUTINE W REFLEX MICROSCOPIC
BILIRUBIN URINE: NEGATIVE
Glucose, UA: NEGATIVE mg/dL
Hgb urine dipstick: NEGATIVE
KETONES UR: NEGATIVE mg/dL
Leukocytes, UA: NEGATIVE
NITRITE: NEGATIVE
PROTEIN: NEGATIVE mg/dL
SPECIFIC GRAVITY, URINE: 1.013 (ref 1.005–1.030)
pH: 7 (ref 5.0–8.0)

## 2017-10-04 LAB — HEMOGLOBIN A1C
Hgb A1c MFr Bld: 5.6 % (ref 4.8–5.6)
Mean Plasma Glucose: 114.02 mg/dL

## 2017-10-04 LAB — AMMONIA: AMMONIA: 29 umol/L (ref 9–35)

## 2017-10-04 MED ORDER — FINASTERIDE 5 MG PO TABS
5.0000 mg | ORAL_TABLET | Freq: Every day | ORAL | Status: DC
  Administered 2017-10-04 – 2017-10-06 (×3): 5 mg via ORAL
  Filled 2017-10-04 (×5): qty 1

## 2017-10-04 MED ORDER — ENOXAPARIN SODIUM 40 MG/0.4ML ~~LOC~~ SOLN
40.0000 mg | Freq: Every day | SUBCUTANEOUS | Status: DC
  Administered 2017-10-04 – 2017-10-05 (×2): 40 mg via SUBCUTANEOUS
  Filled 2017-10-04 (×3): qty 0.4

## 2017-10-04 MED ORDER — AMLODIPINE BESYLATE 5 MG PO TABS
5.0000 mg | ORAL_TABLET | Freq: Every day | ORAL | Status: DC
  Administered 2017-10-04 – 2017-10-06 (×3): 5 mg via ORAL
  Filled 2017-10-04 (×3): qty 1

## 2017-10-04 MED ORDER — LIDOCAINE 5 % EX PTCH
1.0000 | MEDICATED_PATCH | CUTANEOUS | Status: DC
  Administered 2017-10-05: 1 via TRANSDERMAL
  Filled 2017-10-04 (×3): qty 1

## 2017-10-04 MED ORDER — SODIUM CHLORIDE 0.9 % IV SOLN
INTRAVENOUS | Status: AC
  Administered 2017-10-04: 08:00:00 via INTRAVENOUS

## 2017-10-04 MED ORDER — POTASSIUM CHLORIDE CRYS ER 20 MEQ PO TBCR
20.0000 meq | EXTENDED_RELEASE_TABLET | Freq: Two times a day (BID) | ORAL | Status: DC
  Administered 2017-10-04 – 2017-10-06 (×4): 20 meq via ORAL
  Filled 2017-10-04 (×5): qty 1

## 2017-10-04 MED ORDER — FERROUS SULFATE 325 (65 FE) MG PO TABS
325.0000 mg | ORAL_TABLET | Freq: Two times a day (BID) | ORAL | Status: DC
Start: 2017-10-04 — End: 2017-10-06
  Administered 2017-10-04 – 2017-10-06 (×4): 325 mg via ORAL
  Filled 2017-10-04 (×5): qty 1

## 2017-10-04 MED ORDER — LORAZEPAM 2 MG/ML IJ SOLN
1.0000 mg | Freq: Once | INTRAMUSCULAR | Status: AC
  Administered 2017-10-04: 1 mg via INTRAVENOUS
  Filled 2017-10-04: qty 1

## 2017-10-04 MED ORDER — ASPIRIN EC 325 MG PO TBEC
325.0000 mg | DELAYED_RELEASE_TABLET | Freq: Every day | ORAL | Status: DC
  Administered 2017-10-04 – 2017-10-06 (×3): 325 mg via ORAL
  Filled 2017-10-04 (×3): qty 1

## 2017-10-04 MED ORDER — CLOPIDOGREL BISULFATE 75 MG PO TABS
75.0000 mg | ORAL_TABLET | Freq: Every day | ORAL | Status: DC
  Administered 2017-10-04 – 2017-10-06 (×3): 75 mg via ORAL
  Filled 2017-10-04 (×3): qty 1

## 2017-10-04 MED ORDER — PANTOPRAZOLE SODIUM 40 MG PO TBEC
40.0000 mg | DELAYED_RELEASE_TABLET | Freq: Every day | ORAL | Status: DC
  Administered 2017-10-04 – 2017-10-06 (×3): 40 mg via ORAL
  Filled 2017-10-04 (×3): qty 1

## 2017-10-04 MED ORDER — ISOSORBIDE MONONITRATE ER 30 MG PO TB24
30.0000 mg | ORAL_TABLET | Freq: Every day | ORAL | Status: DC
  Administered 2017-10-04 – 2017-10-06 (×3): 30 mg via ORAL
  Filled 2017-10-04 (×3): qty 1

## 2017-10-04 MED ORDER — TERAZOSIN HCL 5 MG PO CAPS
10.0000 mg | ORAL_CAPSULE | Freq: Every day | ORAL | Status: DC
  Administered 2017-10-04 – 2017-10-05 (×2): 10 mg via ORAL
  Filled 2017-10-04 (×2): qty 2

## 2017-10-04 MED ORDER — TRAMADOL HCL 50 MG PO TABS
50.0000 mg | ORAL_TABLET | Freq: Three times a day (TID) | ORAL | Status: DC
Start: 2017-10-04 — End: 2017-10-04

## 2017-10-04 MED ORDER — SODIUM CHLORIDE 0.9 % IV BOLUS (SEPSIS)
500.0000 mL | Freq: Once | INTRAVENOUS | Status: AC
  Administered 2017-10-04: 500 mL via INTRAVENOUS

## 2017-10-04 MED ORDER — ATORVASTATIN CALCIUM 40 MG PO TABS
40.0000 mg | ORAL_TABLET | Freq: Every day | ORAL | Status: DC
  Administered 2017-10-05 – 2017-10-06 (×2): 40 mg via ORAL
  Filled 2017-10-04 (×2): qty 1

## 2017-10-04 MED ORDER — STROKE: EARLY STAGES OF RECOVERY BOOK
Freq: Once | Status: DC
  Filled 2017-10-04: qty 1

## 2017-10-04 MED ORDER — KETOROLAC TROMETHAMINE 15 MG/ML IJ SOLN
15.0000 mg | Freq: Once | INTRAMUSCULAR | Status: AC
  Administered 2017-10-04: 15 mg via INTRAVENOUS
  Filled 2017-10-04: qty 1

## 2017-10-04 MED ORDER — CARVEDILOL 3.125 MG PO TABS
3.1250 mg | ORAL_TABLET | Freq: Two times a day (BID) | ORAL | Status: DC
  Administered 2017-10-04 – 2017-10-06 (×5): 3.125 mg via ORAL
  Filled 2017-10-04 (×6): qty 1

## 2017-10-04 MED ORDER — ACETAMINOPHEN 650 MG RE SUPP: 650.0000 mg | RECTAL | Status: DC | PRN

## 2017-10-04 MED ORDER — MORPHINE SULFATE (PF) 4 MG/ML IV SOLN
1.0000 mg | INTRAVENOUS | Status: DC | PRN
  Administered 2017-10-04: 1 mg via INTRAVENOUS
  Filled 2017-10-04: qty 1

## 2017-10-04 MED ORDER — ATORVASTATIN CALCIUM 20 MG PO TABS
20.0000 mg | ORAL_TABLET | Freq: Every day | ORAL | Status: DC
  Administered 2017-10-04: 20 mg via ORAL
  Filled 2017-10-04: qty 1

## 2017-10-04 MED ORDER — ACETAMINOPHEN 160 MG/5ML PO SOLN: 650.0000 mg | ORAL | Status: DC | PRN

## 2017-10-04 MED ORDER — NITROGLYCERIN 0.4 MG SL SUBL: 0.4000 mg | SUBLINGUAL_TABLET | SUBLINGUAL | Status: DC | PRN

## 2017-10-04 MED ORDER — ACETAMINOPHEN 325 MG PO TABS
650.0000 mg | ORAL_TABLET | ORAL | Status: DC | PRN
  Administered 2017-10-04 – 2017-10-06 (×4): 650 mg via ORAL
  Filled 2017-10-04 (×4): qty 2

## 2017-10-04 MED ORDER — DIPHENHYDRAMINE HCL 50 MG/ML IJ SOLN
12.5000 mg | Freq: Once | INTRAMUSCULAR | Status: AC
  Administered 2017-10-04: 12.5 mg via INTRAVENOUS
  Filled 2017-10-04: qty 1

## 2017-10-04 MED ORDER — METOCLOPRAMIDE HCL 5 MG/ML IJ SOLN
5.0000 mg | Freq: Once | INTRAMUSCULAR | Status: AC
  Administered 2017-10-04: 5 mg via INTRAVENOUS
  Filled 2017-10-04: qty 2

## 2017-10-04 MED ORDER — IOPAMIDOL (ISOVUE-370) INJECTION 76%
INTRAVENOUS | Status: AC
  Administered 2017-10-04: 50 mL
  Filled 2017-10-04: qty 50

## 2017-10-04 NOTE — Progress Notes (Signed)
Patient back from MRI. Jewelry given back to patient.

## 2017-10-04 NOTE — ED Notes (Signed)
Dr. Nevada Crane paged and notified of pt bp 163/80. No addition orders received at this time and instructed to administer bp medications as scheduled

## 2017-10-04 NOTE — ED Notes (Signed)
Hospital bed ordered @ 1015.

## 2017-10-04 NOTE — Progress Notes (Signed)
Pt off unit for MRI. Pt informed take off 3 rings ,necklace and wrist watch prior to MRI. Nurse to give jewelry back to pt upon return from MRI

## 2017-10-04 NOTE — Progress Notes (Signed)
STROKE TEAM PROGRESS NOTE  Admission History: Darrell Warmuth. is a 71 y.o. male with a history of CAD, hypertension, hyperlipidemia who presents with unsteady gait for 2 weeks.  He states that he feels unbalanced whenever he tries to walk.  He denies numbness or weakness.  He denies vertigo.  He does state that he feels like he is not hearing as well out of his left ear as he typically does.  His wife states that he has been consistently falling to the left.  Over the past 3 days, he has noticed that he has a left-sided headache which is at the left occipital area radiating forward and onto his neck.  He does feel that he has had some diplopia, but this is not persistent.  LKW: 2 weeks ago tpa given?: no, outside of window  SUBJECTIVE (INTERVAL HISTORY) No family  is at the bedside. Patient is found laying in bed in NAD  Overall he feels his condition is stable. Voices no new complaints. No new events reported overnight. Denies any worsening of his symptoms. He denies any history of motor vehicle accident, neck injury or significant pain. He does give history of degenerative cervical spine disease but has not had any recent neck imaging studies  OBJECTIVE Lab Results: CBC:  Recent Labs  Lab 10/03/17 2017 10/03/17 2040 10/04/17 0426  WBC 10.2  --  8.1  HGB 14.3 14.6 13.1  HCT 41.3 43.0 39.5  MCV 96.7  --  97.5  PLT 170  --  147*   BMP: Recent Labs  Lab 10/03/17 2017 10/03/17 2040 10/04/17 0426  NA 138 143  --   K 3.3* 3.6  --   CL 105 104  --   CO2 25  --   --   GLUCOSE 87 88  --   BUN 23* 24*  --   CREATININE 0.94 0.80 0.85  CALCIUM 9.0  --   --    Liver Function Tests:  Recent Labs  Lab 10/03/17 2017  AST 20  ALT 20  ALKPHOS 68  BILITOT 0.2*  PROT 6.5  ALBUMIN 3.6   Recent Labs  Lab 10/03/17 2322  AMMONIA 29   Coagulation Studies:  Recent Labs    10/03/17 2017  APTT 31  INR 1.02   Urinalysis:  Recent Labs  Lab 10/04/17 0132  COLORURINE STRAW*   APPEARANCEUR CLEAR  LABSPEC 1.013  PHURINE 7.0  GLUCOSEU NEGATIVE  HGBUR NEGATIVE  BILIRUBINUR NEGATIVE  KETONESUR NEGATIVE  PROTEINUR NEGATIVE  NITRITE NEGATIVE  LEUKOCYTESUR NEGATIVE   PHYSICAL EXAM Temp:  [97.8 F (36.6 C)-98.2 F (36.8 C)] 97.8 F (36.6 C) (11/29 0757) Pulse Rate:  [46-65] 55 (11/29 1215) Resp:  [1-24] 21 (11/29 1215) BP: (129-179)/(62-92) 129/62 (11/29 1215) SpO2:  [94 %-100 %] 99 % (11/29 1215) Weight:  [80.7 kg (178 lb)] 80.7 kg (178 lb) (11/28 2003) General - Well nourished, well developed, in no apparent distress Respiratory - Lungs clear bilaterally. No wheezing. Cardiovascular - Regular rate and rhythm   Neuro: Mental Status: Patient is awake, alert, oriented to person, place, month, year, and situation. Patient is able to give a clear and coherent history. No signs of aphasia or neglect Cranial Nerves: II: Visual Fields are full. Pupils are equal, round, and reactive to light.   III,IV, VI: EOMI without ptosis or diploplia.  V: Facial sensation is symmetric to temperature VII: Facial movement is symmetric.  VIII: hearing is intact to voice X: Uvula elevates symmetrically XI:  Shoulder shrug is symmetric. XII: tongue is midline without atrophy or fasciculations.  Motor: Tone is normal. Bulk is normal. 5/5 strength was present in all four extremities.  Sensory: Sensation is symmetric to light touch and temperature in the arms and legs. Cerebellar: He is slightly slower with finger-nose-finger and heel-knee-shin on the left compared to right  IMAGING: I have personally reviewed the radiological images below and agree with the radiology interpretations.  Ct Head Wo Contrast Result Date: 10/03/2017 IMPRESSION: No CT evidence for acute intracranial abnormality.  Ct Angio Head & Neck W Or Wo Contrast Result Date: 10/04/2017 IMPRESSION: CTA NECK: 1. Gradual loss of LEFT vertebral artery contrast opacification most compatible with  dissection. No significant reconstitution in the neck. 2. Atherosclerosis without hemodynamically significant stenosis. 3. 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 CTA HEAD: 1. Loss of LEFT vertebral artery contrast opacification favoring slow flow given CT HEAD and recent MRI appearance. 2. No emergent large vessel occlusion or severe stenosis. Mild intracranial atherosclerosis. Aortic Atherosclerosis (ICD10-I70.0).  Mr Mrv Head Wo Cm Result Date: 10/04/2017 IMPRESSION: 1. Normal noncontrast MRI of the head for age. 2. Normal noncontrast MRV head.   Echocardiogram: not done                            PENDING  IR Angiogram                                                   PENDING 10/05/17 _____________________________________________________________________ ASSESSMENT: [name]. is a 71 y.o. male with PMH of CAD, HTN, HLD who presents with with progressive gait disturbance, posterior headache/Neck pain x 3+ weeks. Patient describes intermittent episodes of ataxia, lasting 30 -90 minutes, occurring 3-4x/day. MRI negative for stroke. 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  10/04/17: Neuro exam stable with no worsening of symptoms, Patient describes the same symptoms occurring intermittently throughout the day. Will order MRI to evaluate cervical spine and IR Angio in AM to evaluate vertebral artery occlusion. Increased Lipitor home dose to 40 mg. Will continue ASA and Plavix for now.   STROKE:  Suspected Etiology: likely chronic vertebral artery occlusion with sub-optimal compensation from ie: dehydration, Hypo/hyper tension, smoking Resultant Symptoms: Intermittent Ataxia Stroke Risk Factors: carotid stenosis, hyperlipidemia and hypertension Other Stroke Risk Factors: Advanced age, Cigarette smoker, Coronary artery  disease  Outstanding Stroke Work-up Studies: Echocardiogram: not done                                  PENDING IR Angiogram                                                       PENDING 10/05/17   PLAN  10/04/2017: Continue Aspirin/ Plavix/ Statin - increased home dose today MRI Cervical WO IR Angiogram in AM with Dr Patrecia Pour PT/OT/SLP Ongoing aggressive stroke risk factor management Patient counseled to be compliant with her antithrombotic medications  INTRACRANIAL Atherosclerosis &  Stenosis: On DAPT, continue for 3 months and then Plavix alone  FLUID/ELECTROLYTE DISORDERS: Hypo Potassium - Replacement per Medicine team  HYPERTENSION: Stable - Avoid Hypotension or Accelerated HTN Permissive hypertension (OK if <220/120) for 24-48 hours post stroke and then gradually normalized within 5-7 days. Long term BP goal normotensive. May slowly restart home B/P medications after 48 hours Home Meds: Norvasc, Coreg, HCTZ, Imdur, Hytrin  HYPERLIPIDEMIA:    Component Value Date/Time   CHOL 143 10/04/2017 0426   TRIG 97 10/04/2017 0426   HDL 35 (L) 10/04/2017 0426   CHOLHDL 4.1 10/04/2017 0426   VLDL 19 10/04/2017 0426   LDLCALC 89 10/04/2017 0426  Home Meds:  Lipitor 20 mg LDL  goal < 70 Increased to Lipitor to 40 mg daily Continue statin at discharge  R/O DIABETES: Lab Results  Component Value Date   HGBA1C 5.6 10/04/2017  HgbA1c goal < 7.0  TOBACCO ABUSE Current smoker - States quit this week Smoking cessation counseling provided Nicotine patch provided PRN  Other Active Problems: Principal Problem:   Vertebro basilar insufficiency Active Problems:   S/P CABG x 3 11/2011: LIMA TO LAD, SVG TO OM, SVG TO RCA., VG to OM occluded, jeopardized LCX, by cath 08/29/12 -- Re-Do CABG with SVG-OM (09/2012)   HTN (hypertension)   ICM, EF 40% Jan 2013,  50-55% Oct 2013,  40-45% with apical infarct 10/18/12   Vertebral artery dissection (HCC)   Vertebral artery  insufficiency  Hospital day # 0  VTE prophylaxis: Lovenox / SCD's / Heparin /Xarelto Diet : Diet Heart Room service appropriate? Yes; Fluid consistency: Thin   Prior Home Stroke Medications:  aspirin 325 mg daily, clopidogrel 75 mg daily and Lipitor 20 mg  Hospital Current Stroke Medications: ASA 325mg , Plavix 75 mg and Lipitor 40 mg Stroke New Meds Plan: Now on aspirin 325 mg daily, clopidogrel 75 mg daily and Lipitor 40 mg  Discharge Stroke Meds: Please discharge patient on aspirin 325 mg daily, clopidogrel 75 mg daily and Lipitor 40 mg   Disposition: 01-Home or Self Care Therapy Recs:  HOME Follow Recs:  [facility] [place]- PCP in 2-3 weeks Follow up with [facility] in 6 weeks  FAMILY UPDATES: No family at bedside  TEAM UPDATES: Kayleen Memos, DO  Renie O[facility] Stroke Neurology Team 10/04/2017 1:33 PM I have personally examined this patient, reviewed notes, independently viewed imaging studies, participated in medical decision making and plan of care.ROS completed by me personally and pertinent positives fully documented  I have made any additions or clarifications directly to the above note. Agree with note above.  He has presented with two-week history of intermittent transient episodes of vertebrobasilar TIAs and gait ataxia to the left likely occlusive vertebrobasilar disease. Recommend check diagnostic catheters cerebral angiogram to evaluate for any treatable lesion. Check MRI scan cervical spine to look for significant compression. Dual antiplatelet therapy and aggressive risk factor modification. Greater than 50% time during this 25 minute visit was spent on counseling and coordination of care about TIA, vertebral artery occlusion and discussion about evaluation and treatment plan and answering questions.  Antony Contras, MD Medical Director [facility] Pager: [phone] 10/04/2017 2:51 PM   To contact Stroke Continuity  provider, please refer to [email or web address] After hours, contact General Neurology

## 2017-10-04 NOTE — ED Notes (Addendum)
Dr. Kakrakandy at bedside. 

## 2017-10-04 NOTE — Progress Notes (Signed)
PROGRESS NOTE  [name]. DJS:[id number] DOB: 08-17-1946 DOA: 10/03/2017 PCP: [facility] [place]  HPI/Recap of past 24 hours: Mr Pavek is a 71 yo M with [place] significant for CAD s/p CABG, PCI with satent, HTN, HLD, who presented to the ED [facility] with complaints of unsteady gait of 2 weeks duration associated with headaches. likely chronic vertebral artery occlusion with sub-optimal compensation from ie: dehydration, Hypo/hyper tension, smoking.  The patient was seen and examined with his wife at his bedside. Complaints of headaches worsened with light. Denies hx of migraines or headaches. Headache cocktail provided.   Assessment/Plan: Principal Problem:   Vertebro basilar insufficiency Active Problems:   S/P CABG x 3 11/2011: LIMA TO LAD, SVG TO OM, SVG TO RCA., VG to OM occluded, jeopardized LCX, by cath 08/29/12 -- Re-Do CABG with SVG-OM (09/2012)   HTN (hypertension)   ICM, EF 40% Jan 2013,  50-55% Oct 2013,  40-45% with apical infarct 10/18/12   Vertebral artery dissection (HCC)   Vertebral artery insufficiency   Ataxia most likely 2/2 to non compensated chronic vertebral occlusion  -Neurology following -continue asa, clopidogrel, lipitor -MRI to evaluate cervical spine -IR angio in AM to evaluate artery occlusion per neuro -PT/OT evaluate and treat  Intractable headache -headache cocktail -IV toradol, IV benadryl, IV reglan  HLD -LDL 89 -continue statin  HTN -stable -coreg, amlodipine, proscar, terazosin  Hypokalemia, resolved -replete as indicated -bmp am  BPH -proscar, terazosin  Chronic anemia -stable hg 13.1 -ferrous sulfate -cbc  GERD -stable -protonix 40 mg po daily  Tobacco use disorder -Tobacco cessation counseling at bedside  Code Status: Full  Family Communication: wife at bedside  Disposition Plan: will stay another midnight to complete neuro work up and to continue  tyreatment   Consultants:  neuro  Procedures:  None  Antimicrobials:  None  DVT prophylaxis:  Lovenox 40 mg sq daily   Objective: Vitals:   10/04/17 1200 10/04/17 1215 10/04/17 1300 10/04/17 1400  BP: 139/67 129/62 (!) 161/71 (!) 141/63  Pulse: (!) 55 (!) 55 (!) 55 (!) 55  Resp: (!) 24 (!) 21 11 (!) 23  Temp:      TempSrc:      SpO2: 97% 99% 100% 98%  Weight:      Height:        Intake/Output Summary (Last 24 hours) at 10/04/2017 1520 Last data filed at 10/04/2017 1135 Gross per 24 hour  Intake 1000 ml  Output 550 ml  Net 450 ml   Filed Weights   10/03/17 2003  Weight: 80.7 kg (178 lb)    Exam:   General:  60 male Wd WN uncomfortable due to headache  Cardiovascular: RRR no rubs or gallops  Respiratory: CTA no wheezes or rhonchi  Abdomen: Soft NT ND NBS x4   Musculoskeletal: moves all 4 limbs. 2/4 pulses in all 4  Skin: No open lesions noted  Psychiatry: Mood appropriate for  condition and setting   Data Reviewed: CBC: Recent Labs  Lab 10/03/17 2017 10/03/17 2040 10/04/17 0426  WBC 10.2  --  8.1  NEUTROABS 6.3  --   --   HGB 14.3 14.6 13.1  HCT 41.3 43.0 39.5  MCV 96.7  --  97.5  PLT 170  --  885*   Basic Metabolic Panel: Recent Labs  Lab 10/03/17 2017 10/03/17 2040 10/04/17 0426  NA 138 143  --   K 3.3* 3.6  --   CL 105 104  --  CO2 25  --   --   GLUCOSE 87 88  --   BUN 23* 24*  --   CREATININE 0.94 0.80 0.85  CALCIUM 9.0  --   --    GFR: Estimated Creatinine Clearance: 83.5 mL/min (by C-G formula based on SCr of 0.85 mg/dL). Liver Function Tests: Recent Labs  Lab 10/03/17 2017  AST 20  ALT 20  ALKPHOS 68  BILITOT 0.2*  PROT 6.5  ALBUMIN 3.6   No results for input(s): LIPASE, AMYLASE in the last 168 hours. Recent Labs  Lab 10/03/17 2322  AMMONIA 29   Coagulation Profile: Recent Labs  Lab 10/03/17 2017  INR 1.02   Cardiac Enzymes: No results for input(s): CKTOTAL, CKMB, CKMBINDEX, TROPONINI in the  last 168 hours. BNP (last 3 results) No results for input(s): PROBNP in the last 8760 hours. HbA1C: Recent Labs    10/04/17 0426  HGBA1C 5.6   CBG: No results for input(s): GLUCAP in the last 168 hours. Lipid Profile: Recent Labs    10/04/17 0426  CHOL 143  HDL 35*  LDLCALC 89  TRIG 97  CHOLHDL 4.1   Thyroid Function Tests: No results for input(s): TSH, T4TOTAL, FREET4, T3FREE, THYROIDAB in the last 72 hours. Anemia Panel: No results for input(s): VITAMINB12, FOLATE, FERRITIN, TIBC, IRON, RETICCTPCT in the last 72 hours. Urine analysis:    Component Value Date/Time   COLORURINE STRAW (A) 10/04/2017 0132   APPEARANCEUR CLEAR 10/04/2017 0132   LABSPEC 1.013 10/04/2017 0132   PHURINE 7.0 10/04/2017 0132   GLUCOSEU NEGATIVE 10/04/2017 0132   HGBUR NEGATIVE 10/04/2017 0132   BILIRUBINUR NEGATIVE 10/04/2017 0132   KETONESUR NEGATIVE 10/04/2017 0132   PROTEINUR NEGATIVE 10/04/2017 0132   UROBILINOGEN 0.2 03/09/2014 1814   NITRITE NEGATIVE 10/04/2017 0132   LEUKOCYTESUR NEGATIVE 10/04/2017 0132   Sepsis Labs: @LABRCNTIP (procalcitonin:4,lacticidven:4)  )No results found for this or any previous visit (from the past 240 hour(s)).    Studies: Ct Angio Head W Or Wo Contrast  Result Date: 10/04/2017 CLINICAL DATA:  Ataxia, staggering gait for 2 weeks. Headache for 3-4 days. Intermittent confusion. History of hypertension, hypercholesterolemia. EXAM: CT ANGIOGRAPHY HEAD AND NECK TECHNIQUE: Multidetector CT imaging of the head and neck was performed using the standard protocol during bolus administration of intravenous contrast. Multiplanar CT image reconstructions and MIPs were obtained to evaluate the vascular anatomy. Carotid stenosis measurements (when applicable) are obtained utilizing NASCET criteria, using the distal internal carotid diameter as the denominator. CONTRAST:  17mL ISOVUE-370 IOPAMIDOL (ISOVUE-370) INJECTION 76% COMPARISON:  MRI of the head October 04, 2017 at  0158 hours and CT HEAD October 03, 2017. FINDINGS: CTA NECK- Mild motion degraded examination. AORTIC ARCH: Normal appearance of the thoracic arch, normal branch pattern. Moderate calcific atherosclerosis. The origins of the innominate, left Common carotid artery and subclavian artery are widely patent. RIGHT CAROTID SYSTEM: Common carotid artery is widely patent, coursing in a straight line fashion. Shelf-like intimal thickening calcific atherosclerosis with 3 mm outpouching and luminal irregularity proximal internal carotid artery. No hemodynamically significant stenosis by NASCET criteria. LEFT CAROTID SYSTEM: Common carotid artery is widely patent, coursing in a straight line fashion. Moderate eccentric calcific atherosclerosis carotid bifurcation without hemodynamically significant stenosis by NASCET criteria. Normal appearance of the internal carotid artery. VERTEBRAL ARTERIES:Mild stenosis RIGHT vertebral artery origin. Codominant vertebral artery's. There is gradual loss of contrast opacification LEFT P2 segment. SKELETON: No acute osseous process though bone windows have not been submitted. Degenerative change of the cervical spine resulting  in severe LEFT C4-5, moderate to severe RIGHT C5-6 and C6-7 neural foraminal narrowing. OTHER NECK: Soft tissues of the neck are nonacute though, not tailored for evaluation. UPPER CHEST: Included lung apices are clear. No superior mediastinal lymphadenopathy. Status post median sternotomy for CABG. CTA HEAD ANTERIOR CIRCULATION: Patent cervical internal carotid arteries, petrous, cavernous and supra clinoid internal carotid arteries. Patent anterior communicating artery. Patent anterior and middle cerebral arteries, mild luminal irregularity compatible with atherosclerosis. No large vessel occlusion, significant stenosis, contrast extravasation or aneurysm. POSTERIOR CIRCULATION: Complete loss of contrast opacification LEFT vertebral artery, with coarse calcifications  mid LEFT V4 segment. Mild luminal irregularity RIGHT vertebral artery. Patent RIGHT vertebral artery, vertebrobasilar junction and basilar artery, as well as main branch vessels. Patent posterior cerebral arteries, mild luminal irregularity compatible with atherosclerosis. Robust RIGHT posterior communicating artery present. No large vessel occlusion, significant stenosis, contrast extravasation or aneurysm. VENOUS SINUSES: Major dural venous sinuses are patent though not tailored for evaluation on this angiographic examination. ANATOMIC VARIANTS: Hypoplastic RIGHT A1 segment. DELAYED PHASE: No abnormal intracranial enhancement. Symmetric vertebral artery enhancement. MIP images reviewed. IMPRESSION: CTA NECK: 1. Gradual loss of LEFT vertebral artery contrast opacification most compatible with dissection. No significant reconstitution in the neck. 2. Atherosclerosis without hemodynamically significant stenosis. 3. 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 CTA HEAD: 1. Loss of LEFT vertebral artery contrast opacification favoring slow flow given CT HEAD and recent MRI appearance. 2. No emergent large vessel occlusion or severe stenosis. Mild intracranial atherosclerosis. Acute findings discussed with and reconfirmed by Dr.MCNEILL [facility] on 10/04/2017 at 3:32 am. Aortic Atherosclerosis (ICD10-I70.0). Electronically Signed   By: Elon Alas M.D.   On: 10/04/2017 03:30   Ct Head Wo Contrast  Result Date: 10/03/2017 CLINICAL DATA:  Headache EXAM: CT HEAD WITHOUT CONTRAST TECHNIQUE: Contiguous axial images were obtained from the base of the skull through the vertex without intravenous contrast. COMPARISON:  None. FINDINGS: Brain: No acute territorial infarction, hemorrhage or intracranial mass is visualized. Mild atrophy. Ventricle size within normal limits Vascular: No hyperdense vessels.  Carotid vessel calcification. Calcifications in the left vertebral artery Skull: Normal. Negative for fracture or focal lesion. Sinuses/Orbits: Mucosal thickening in the maxillary and ethmoid sinuses. No acute orbital abnormality. Other: None IMPRESSION: No CT evidence for acute intracranial abnormality. Electronically Signed   By: Donavan Foil M.D.   On: 10/03/2017 20:54   Ct Angio Neck W Or Wo Contrast  Result Date: 10/04/2017 CLINICAL DATA:  Ataxia, staggering gait for 2 weeks. Headache for 3-4 days. Intermittent confusion. History of hypertension, hypercholesterolemia. EXAM: CT ANGIOGRAPHY HEAD AND NECK TECHNIQUE: Multidetector CT imaging of the head and neck was performed using the standard protocol during bolus administration of intravenous contrast. Multiplanar CT image reconstructions and MIPs were obtained to evaluate the vascular anatomy. Carotid stenosis measurements (when applicable) are obtained utilizing NASCET criteria, using the distal internal carotid diameter as the denominator. CONTRAST:  26mL ISOVUE-370 IOPAMIDOL (ISOVUE-370) INJECTION 76% COMPARISON:  MRI of the head October 04, 2017 at 0158 hours and CT HEAD October 03, 2017. FINDINGS: CTA NECK- Mild motion degraded examination. AORTIC ARCH: Normal appearance of the thoracic arch, normal branch pattern. Moderate calcific atherosclerosis. The origins of the innominate, left Common carotid artery and subclavian artery are widely patent. RIGHT CAROTID SYSTEM: Common carotid artery is widely patent, coursing in a straight line fashion. Shelf-like intimal thickening calcific atherosclerosis with 3 mm outpouching and luminal irregularity  proximal internal carotid artery. No hemodynamically significant stenosis by NASCET criteria. LEFT CAROTID SYSTEM: Common carotid artery is widely patent, coursing in a straight line fashion. Moderate eccentric calcific atherosclerosis carotid bifurcation without hemodynamically significant stenosis by NASCET  criteria. Normal appearance of the internal carotid artery. VERTEBRAL ARTERIES:Mild stenosis RIGHT vertebral artery origin. Codominant vertebral artery's. There is gradual loss of contrast opacification LEFT P2 segment. SKELETON: No acute osseous process though bone windows have not been submitted. Degenerative change of the cervical spine resulting in severe LEFT C4-5, moderate to severe RIGHT C5-6 and C6-7 neural foraminal narrowing. OTHER NECK: Soft tissues of the neck are nonacute though, not tailored for evaluation. UPPER CHEST: Included lung apices are clear. No superior mediastinal lymphadenopathy. Status post median sternotomy for CABG. CTA HEAD ANTERIOR CIRCULATION: Patent cervical internal carotid arteries, petrous, cavernous and supra clinoid internal carotid arteries. Patent anterior communicating artery. Patent anterior and middle cerebral arteries, mild luminal irregularity compatible with atherosclerosis. No large vessel occlusion, significant stenosis, contrast extravasation or aneurysm. POSTERIOR CIRCULATION: Complete loss of contrast opacification LEFT vertebral artery, with coarse calcifications mid LEFT V4 segment. Mild luminal irregularity RIGHT vertebral artery. Patent RIGHT vertebral artery, vertebrobasilar junction and basilar artery, as well as main branch vessels. Patent posterior cerebral arteries, mild luminal irregularity compatible with atherosclerosis. Robust RIGHT posterior communicating artery present. No large vessel occlusion, significant stenosis, contrast extravasation or aneurysm. VENOUS SINUSES: Major dural venous sinuses are patent though not tailored for evaluation on this angiographic examination. ANATOMIC VARIANTS: Hypoplastic RIGHT A1 segment. DELAYED PHASE: No abnormal intracranial enhancement. Symmetric vertebral artery enhancement. MIP images reviewed. IMPRESSION: CTA NECK: 1. Gradual loss of LEFT vertebral artery contrast opacification most compatible with dissection.  No significant reconstitution in the neck. 2. Atherosclerosis without hemodynamically significant stenosis. 3. 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 CTA HEAD: 1. Loss of LEFT vertebral artery contrast opacification favoring slow flow given CT HEAD and recent MRI appearance. 2. No emergent large vessel occlusion or severe stenosis. Mild intracranial atherosclerosis. Acute findings discussed with and reconfirmed by Dr.MCNEILL [facility] on 10/04/2017 at 3:32 am. Aortic Atherosclerosis (ICD10-I70.0). Electronically Signed   By: Elon Alas M.D.   On: 10/04/2017 03:30   Mr Brain Wo Contrast  Result Date: 10/04/2017 CLINICAL DATA:  Gait disturbance, confusion and blurry vision for 5 days. Three day LEFT-sided headache. History of hypertension, hypercholesterolemia. EXAM: MRI HEAD WITHOUT CONTRAST MRV HEAD WITHOUT CONTRAST TECHNIQUE: Multiplanar, multiecho pulse sequences of the brain and surrounding structures were obtained without intravenous contrast. Angiographic images of the intracranial venous structures were obtained using MRV technique without intravenous contrast. COMPARISON:  CT HEAD October 03, 2017 FINDINGS: MRI HEAD: BRAIN: No reduced diffusion to suggest acute ischemia or hyperacute demyelination. No susceptibility artifact to suggest hemorrhage. The ventricles and sulci are normal for patient's age. No suspicious parenchymal signal, mass or mass effect. No abnormal extra-axial fluid collections. VASCULAR: Normal major intracranial vascular flow voids present at skull base. SKULL AND UPPER CERVICAL SPINE: No abnormal sellar expansion. No suspicious calvarial bone marrow signal. Craniocervical junction maintained. SINUSES/ORBITS: Mild paranasal sinus mucosal thickening without air-fluid levels. Mastoid air cells are well aerated. The included ocular globes and orbital contents  are non-suspicious. Status post bilateral ocular lens implants. OTHER: Patient is edentulous. MRV HEAD: Normal flow related enhancement within the superior sagittal sinus, torcula of the Herophili, bilateral transverse, sigmoid sinuses and included internal jugular veins. Normal flow related enhancement of  the internal cerebral veins. IMPRESSION: 1. Normal noncontrast MRI of the head for age. 2. Normal noncontrast MRV head. Electronically Signed   By: Elon Alas M.D.   On: 10/04/2017 02:41   Mr Mrv Head Wo Cm  Result Date: 10/04/2017 CLINICAL DATA:  Gait disturbance, confusion and blurry vision for 5 days. Three day LEFT-sided headache. History of hypertension, hypercholesterolemia. EXAM: MRI HEAD WITHOUT CONTRAST MRV HEAD WITHOUT CONTRAST TECHNIQUE: Multiplanar, multiecho pulse sequences of the brain and surrounding structures were obtained without intravenous contrast. Angiographic images of the intracranial venous structures were obtained using MRV technique without intravenous contrast. COMPARISON:  CT HEAD October 03, 2017 FINDINGS: MRI HEAD: BRAIN: No reduced diffusion to suggest acute ischemia or hyperacute demyelination. No susceptibility artifact to suggest hemorrhage. The ventricles and sulci are normal for patient's age. No suspicious parenchymal signal, mass or mass effect. No abnormal extra-axial fluid collections. VASCULAR: Normal major intracranial vascular flow voids present at skull base. SKULL AND UPPER CERVICAL SPINE: No abnormal sellar expansion. No suspicious calvarial bone marrow signal. Craniocervical junction maintained. SINUSES/ORBITS: Mild paranasal sinus mucosal thickening without air-fluid levels. Mastoid air cells are well aerated. The included ocular globes and orbital contents are non-suspicious. Status post bilateral ocular lens implants. OTHER: Patient is edentulous. MRV HEAD: Normal flow related enhancement within the superior sagittal sinus, torcula of the Herophili,  bilateral transverse, sigmoid sinuses and included internal jugular veins. Normal flow related enhancement of the internal cerebral veins. IMPRESSION: 1. Normal noncontrast MRI of the head for age. 2. Normal noncontrast MRV head. Electronically Signed   By: Elon Alas M.D.   On: 10/04/2017 02:41    Scheduled Meds: .  stroke: mapping our early stages of recovery book   Does not apply Once  . amLODipine  5 mg Oral Daily  . aspirin  325 mg Oral Daily  . [START ON 10/05/2017] atorvastatin  40 mg Oral Daily  . carvedilol  3.125 mg Oral BID WC  . clopidogrel  75 mg Oral Daily  . diphenhydrAMINE  12.5 mg Intravenous Once  . enoxaparin (LOVENOX) injection  40 mg Subcutaneous Daily  . ferrous sulfate  325 mg Oral BID WC  . finasteride  5 mg Oral Daily  . isosorbide mononitrate  30 mg Oral Daily  . ketorolac  15 mg Intravenous Once  . lidocaine  1 patch Transdermal Q24H  . metoCLOPramide (REGLAN) injection  5 mg Intravenous Once  . pantoprazole  40 mg Oral Daily  . potassium chloride SA  20 mEq Oral BID  . terazosin  10 mg Oral QHS    Continuous Infusions: . sodium chloride 100 mL/hr at 10/04/17 0757     LOS: 0 days     Kayleen Memos, MD [facility] Pager [phone] If 7PM-7AM, please contact night-coverage [email or web address] Password [email or web address] 10/04/2017, 3:20 PM

## 2017-10-04 NOTE — ED Notes (Signed)
Admitting Team at the bedside 

## 2017-10-04 NOTE — ED Notes (Signed)
Heart Healthy diet breakfast tray ordered @ 0800.

## 2017-10-04 NOTE — Consult Note (Signed)
Neurology Consultation Reason for Consult: unsteady gait Referring Physician: Muthersbaugh, H  CC: Unsteady gait  History is obtained from: Patient  HPI: [name]. is a 71 y.o. male with a history of CAD, hypertension, hyperlipidemia who presents with unsteady gait for 2 weeks.  He states that he feels unbalanced whenever he tries to walk.  He denies numbness or weakness.  He denies vertigo.  He does state that he feels like he is not hearing as well out of his left ear as he typically does.  His wife states that he has been consistently falling to the left.  Over the past 3 days, he has noticed that he has a left-sided headache which is at the left occipital area radiating forward and onto his neck.  He does feel that he has had some diplopia, but this is not persistent.   LKW: 2 weeks ago tpa given?: no, outside of window  ROS: A 14 point ROS was performed and is negative except as noted in the HPI.   Past Medical History:  Diagnosis Date  . Anginal pain ([place])   . Arthritis    "in my back and neck" (2012-09-18)  . CAD, CABG X 09 Nov 2011. re-do CABG X1 09/19/12 12/04/2011  . Coronary artery disease   . DDD (degenerative disc disease), cervical   . Hypercholesterolemia   . Hypertension   . Malaria    "I've had it a couple times in the 1960's; almost died from it" (18-Sep-2012)  . NSTEMI (non-ST elevated myocardial infarction) ([place]) 11/2011   anterior  . NSTEMI (non-ST elevated myocardial infarction) ([place]) September 18, 2012  . PTSD (post-traumatic stress disorder)   . S/P angioplasty with stent,  10/31/12 VG to OM that was new graft from 11/13 in setting of NSTEMI 05/05/2013     Family History  Problem Relation Age of Onset  . Heart disease Mother   . Diabetes Mother   . Heart disease Father   . Diabetes Father   . Healthy Sister   . Diabetes Brother   . Diabetes Sister   . Diabetes Brother      Social History:  reports that he quit smoking about 5 years ago. His  smoking use included cigarettes. He has a 25.00 pack-year smoking history. he has never used smokeless tobacco. He reports that he does not drink alcohol or use drugs.   Exam: Current vital signs: BP (!) 179/86   Pulse (!) 50   Temp 98.2 F (36.8 C)   Resp (!) 1   Ht 5\' 10"  (1.778 m)   Wt 80.7 kg (178 lb)   SpO2 99%   BMI 25.54 kg/m  Vital signs in last 24 hours: Temp:  [98.2 F (36.8 C)] 98.2 F (36.8 C) (11/28 1956) Pulse Rate:  [50-58] 50 (11/28 2230) Resp:  [1] 1 (11/28 1956) BP: (144-179)/(80-86) 179/86 (11/28 2230) SpO2:  [99 %-100 %] 99 % (11/28 2230) Weight:  [80.7 kg (178 lb)] 80.7 kg (178 lb) (11/28 2003)   Physical Exam  Constitutional: Appears well-developed and well-nourished.  Psych: Affect appropriate to situation Eyes: No scleral injection HENT: No OP obstrucion Head: Normocephalic.  Cardiovascular: Normal rate and regular rhythm.  Respiratory: Effort normal and breath sounds normal to anterior ascultation GI: Soft.  No distension. There is no tenderness.  Skin: WDI  Neuro: Mental Status: Patient is awake, alert, oriented to person, place, month, year, and situation. Patient is able to give a clear and coherent history. No signs of  aphasia or neglect Cranial Nerves: II: Visual Fields are full. Pupils are equal, round, and reactive to light.   III,IV, VI: EOMI without ptosis or diploplia.  V: Facial sensation is symmetric to temperature VII: Facial movement is symmetric.  VIII: hearing is intact to voice X: Uvula elevates symmetrically XI: Shoulder shrug is symmetric. XII: tongue is midline without atrophy or fasciculations.  Motor: Tone is normal. Bulk is normal. 5/5 strength was present in all four extremities.  Sensory: Sensation is symmetric to light touch and temperature in the arms and legs. Cerebellar: He is slightly slower with finger-nose-finger and heel-knee-shin on the left compared to right   I have reviewed labs in epic and the  results pertinent to this consultation are: CMP-unremarkable  I have reviewed the images obtained: CT head is unremarkable  Impression: 71 year old male with progressive gait disturbance and posterior headache.  I am concerned for possible posterior circulation stroke, and with the headache that he is describing vertebral dissection could be a culprit.  He will need to be worked up further for this.  The progressive nature is a little unusual, however this does occur at times.  The lack of vertigo would argue against peripheral cause.  Recommendations: 1) MRI brain, CTA head and neck 2) further recommendations following the above imaging.   Roland Rack, MD [facility] [phone]  If 7pm- 7am, please page neurology on call as listed in [place].

## 2017-10-04 NOTE — Progress Notes (Signed)
Patient arrived to unit at this time accompanied by family. Safety precautions and orders reviewed with patient/family. TELE applied and confirmed. Pt verbalized that his HA is better at this time. No other distress noted. Will continue to monitor.   Ave Filter, RN

## 2017-10-04 NOTE — H&P (Signed)
History and Physical    [name]. FWY:[id number] DOB: April 15, 1946 DOA: 10/03/2017  PCP: [facility] [place]  Patient coming from: Home.  Chief Complaint: Headache and difficulty walking.  HPI: [name]. is a 71 y.o. male with history of CAD status post CABG status post stenting, hypertension hyperlipidemia presents to the ER because of persistent difficulty walking over the last 2 weeks with headache.  Patient has been having the symptoms for the last 2 weeks initially with balance issues and eventually started developing left-sided headache.  Also has been having decreased hearing on the left ear.  Patient also complains of some blurred vision of the left side.  ED Course: In the ER patient had CT head followed by MRI of the brain and MRV of the brain and eventually had a CT angiogram of the head and neck and was seen by neurologist Dr. Leonel Ramsay.  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  Otherwise he denies any weakness of the upper or lower extremities.  Denies any difficulty swallowing or speaking.  Review of Systems: As per HPI, rest all negative.   Past Medical History:  Diagnosis Date  . Anginal pain ([place])   . Arthritis    "in my back and neck" (Sep 13, 2012)  . CAD, CABG X 09 Nov 2011. re-do CABG X1 09/19/12 12/04/2011  . Coronary artery disease   . DDD (degenerative disc disease), cervical   . Hypercholesterolemia   . Hypertension   . Malaria    "I've had it a couple times in the 1960's; almost died from it" (September 13, 2012)  . NSTEMI (non-ST elevated myocardial infarction) ([place]) 11/2011   anterior  . NSTEMI (non-ST elevated myocardial infarction) ([place]) Sep 13, 2012  . PTSD (post-traumatic stress disorder)   . S/P angioplasty with stent,  10/31/12 VG to OM that was new graft from 11/13 in setting of NSTEMI 05/05/2013    Past Surgical History:  Procedure Laterality  Date  . BACK SURGERY    . CARDIAC CATHETERIZATION  11/2011  . CATARACT EXTRACTION W/ INTRAOCULAR LENS  IMPLANT, BILATERAL  1990's  . CORONARY ANGIOPLASTY  10/2012   stent placed SVG to OM   . CORONARY ARTERY BYPASS GRAFT  12/02/2011   Procedure: CORONARY ARTERY BYPASS GRAFTING (CABG);  Surgeon: Gaye Pollack, MD;  Location: [facility];  Service: Open Heart Surgery;  Laterality: N/A;  . CORONARY ARTERY BYPASS GRAFT  09/19/2012   Procedure: REDO CORONARY ARTERY BYPASS GRAFTING (CABG);  Surgeon: Gaye Pollack, MD;  Location: [facility];  Service: Open Heart Surgery;  Laterality: N/A;  . LEFT HEART CATHETERIZATION WITH CORONARY ANGIOGRAM N/A 12/02/2011   Procedure: LEFT HEART CATHETERIZATION WITH CORONARY ANGIOGRAM;  Surgeon: Lorretta Harp, MD;  Location: [facility] CATH LAB;  Service: Cardiovascular;  Laterality: N/A;  . LEFT HEART CATHETERIZATION WITH CORONARY ANGIOGRAM N/A 08/30/2012   Procedure: LEFT HEART CATHETERIZATION WITH CORONARY ANGIOGRAM;  Surgeon: Troy Sine, MD;  Location: [facility] CATH LAB;  Service: Cardiovascular;  Laterality: N/A;  . LEFT HEART CATHETERIZATION WITH CORONARY ANGIOGRAM N/A 09/11/2012   Procedure: LEFT HEART CATHETERIZATION WITH CORONARY ANGIOGRAM;  Surgeon: Troy Sine, MD;  Location: [facility] CATH LAB;  Service: Cardiovascular;  Laterality: N/A;  . LEFT HEART CATHETERIZATION WITH CORONARY ANGIOGRAM N/A 05/16/2013   Procedure: LEFT HEART CATHETERIZATION WITH CORONARY ANGIOGRAM;  Surgeon: Lorretta Harp, MD;  Location: [facility] CATH LAB;  Service: Cardiovascular;  Laterality: N/A;  .  LEFT HEART CATHETERIZATION WITH CORONARY/GRAFT ANGIOGRAM  08/30/2012   Procedure: LEFT HEART CATHETERIZATION WITH Beatrix Fetters;  Surgeon: Troy Sine, MD;  Location: [facility] CATH LAB;  Service: Cardiovascular;;  . LEFT HEART CATHETERIZATION WITH CORONARY/GRAFT ANGIOGRAM N/A 10/04/2012   Procedure: LEFT HEART CATHETERIZATION WITH Beatrix Fetters;  Surgeon: Leonie Man, MD;  Location: [facility] CATH  LAB;  Service: Cardiovascular;  Laterality: N/A;  . LEFT HEART CATHETERIZATION WITH CORONARY/GRAFT ANGIOGRAM N/A 10/17/2012   Procedure: LEFT HEART CATHETERIZATION WITH Beatrix Fetters;  Surgeon: Leonie Man, MD;  Location: [facility] CATH LAB;  Service: Cardiovascular;  Laterality: N/A;  . LUMBAR DISC SURGERY  1980's?  . PERCUTANEOUS CORONARY STENT INTERVENTION (PCI-S) N/A 10/31/2012   Procedure: PERCUTANEOUS CORONARY STENT INTERVENTION (PCI-S);  Surgeon: Lorretta Harp, MD;  Location: [facility] CATH LAB;  Service: Cardiovascular;  Laterality: N/A;  . TONSILLECTOMY     "as a kid" (08/29/2012)  . TRANSTHORACIC ECHOCARDIOGRAM  10/18/2012   LV EF 40%- 45%   . US ECHOCARDIOGRAPHY  02/29/2012     reports that he quit smoking about 5 years ago. His smoking use included cigarettes. He has a 25.00 pack-year smoking history. he has never used smokeless tobacco. He reports that he does not drink alcohol or use drugs.  No Known Allergies  Family History  Problem Relation Age of Onset  . Heart disease Mother   . Diabetes Mother   . Heart disease Father   . Diabetes Father   . Healthy Sister   . Diabetes Brother   . Diabetes Sister   . Diabetes Brother     Prior to Admission medications   Medication Sig Start Date End Date Taking? Authorizing Provider  amLODipine (NORVASC) 5 MG tablet Take 1 tablet (5 mg total) by mouth daily. 10/25/12  Yes Erlene Quan, PA-C  aspirin EC 325 MG EC tablet Take 1 tablet (325 mg total) by mouth daily. 11/01/12  Yes Brett Canales, PA-C  atorvastatin (LIPITOR) 40 MG tablet Take 20 mg by mouth daily.   Yes [provider]  carvedilol (COREG) 3.125 MG tablet Take 3.125 mg by mouth 2 (two) times daily with a meal.   Yes [provider]  clopidogrel (PLAVIX) 75 MG tablet Take 1 tablet (75 mg total) by mouth daily. 08/05/14  Yes Lorretta Harp, MD  doxycycline (VIBRA-TABS) 100 MG tablet Take 100 mg by mouth 2 (two) times daily.   Yes [provider]  ferrous sulfate 325 (65 FE) MG tablet Take 1 tablet (325 mg total) by mouth 3 (three) times daily with meals. Patient taking differently: Take 325 mg by mouth 2 (two) times daily with a meal.  10/25/12  Yes Kilroy, Luke K, PA-C  finasteride (PROSCAR) 5 MG tablet Take 5 mg by mouth daily.   Yes [provider]  hydrochlorothiazide (HYDRODIURIL) 25 MG tablet Take 25 mg by mouth daily.   Yes [provider]  ibuprofen (ADVIL,MOTRIN) 400 MG tablet Take 400 mg by mouth every 6 (six) hours as needed for mild pain.   Yes [provider]  isosorbide mononitrate (IMDUR) 30 MG 24 hr tablet Take 1 tablet (30 mg total) by mouth daily. 04/23/13  Yes Croitoru, Mihai, MD  lidocaine (LIDODERM) 5 % Place 1 patch onto the skin daily. Remove & Discard patch within 12 hours or as directed by MD   Yes [provider]  nitroGLYCERIN (NITROSTAT) 0.4 MG SL tablet Place 1 tablet (0.4 mg total) under the tongue every 5 (  five) minutes x 3 doses as needed for chest pain. 10/07/12  Yes Brett Canales, PA-C  omeprazole (PRILOSEC) 20 MG capsule Take 20 mg by mouth 2 (two) times daily.   Yes [provider]  potassium chloride SA (K-DUR,KLOR-CON) 20 MEQ tablet Take 20 mEq by mouth 2 (two) times daily.   Yes [provider]  terazosin (HYTRIN) 10 MG capsule Take 10 mg by mouth at bedtime.   Yes [provider]  traMADol (ULTRAM) 50 MG tablet Take 50-100 mg by mouth 3 (three) times daily.   Yes [provider]  oxyCODONE-acetaminophen (PERCOCET) 10-325 MG per tablet Take 1 tablet by mouth every 6 (six) hours as needed for pain. Patient not taking: Reported on 10/03/2017 03/09/14   Delos Haring, PA-C  tamsulosin (FLOMAX) 0.4 MG CAPS capsule Take 1 capsule (0.4 mg total) by mouth daily. Patient not taking: Reported on 10/03/2017 03/09/14   Delos Haring, PA-C    Physical Exam: Vitals:   10/03/17 1956 10/03/17 2003 10/03/17 2230  BP: (!) 144/80   (!) 179/86  Pulse: (!) 58  (!) 50  Resp: (!) 1    Temp: 98.2 F (36.8 C)    SpO2: 100%  99%  Weight:  80.7 kg (178 lb)   Height:  5\' 10"  (1.778 m)       Constitutional: Moderately built and nourished. Vitals:   10/03/17 1956 10/03/17 2003 10/03/17 2230  BP: (!) 144/80  (!) 179/86  Pulse: (!) 58  (!) 50  Resp: (!) 1    Temp: 98.2 F (36.8 C)    SpO2: 100%  99%  Weight:  80.7 kg (178 lb)   Height:  5\' 10"  (1.778 m)    Eyes: Anicteric no pallor. ENMT: No discharge from the ears eyes nose or mouth. Neck: No neck rigidity no mass felt.  No JVD appreciated. Respiratory: No rhonchi or crepitations. Cardiovascular: S1-S2 heard no murmurs appreciated. Abdomen: Soft nontender bowel sounds present. Musculoskeletal: No edema.  No joint effusion. Skin: No rash.  Skin appears warm. Neurologic: Alert awake oriented to time place and person.  Moves all extremities 5 x 5.  No facial asymmetry tongue is midline.  Pupils equal and reacting to light. Psychiatric: Appears normal.  Normal affect.   Labs on Admission: I have personally reviewed following labs and imaging studies  CBC: Recent Labs  Lab 10/03/17 2017 10/03/17 2040  WBC 10.2  --   NEUTROABS 6.3  --   HGB 14.3 14.6  HCT 41.3 43.0  MCV 96.7  --   PLT 170  --    Basic Metabolic Panel: Recent Labs  Lab 10/03/17 2017 10/03/17 2040  NA 138 143  K 3.3* 3.6  CL 105 104  CO2 25  --   GLUCOSE 87 88  BUN 23* 24*  CREATININE 0.94 0.80  CALCIUM 9.0  --    GFR: Estimated Creatinine Clearance: 88.7 mL/min (by C-G formula based on SCr of 0.8 mg/dL). Liver Function Tests: Recent Labs  Lab 10/03/17 2017  AST 20  ALT 20  ALKPHOS 68  BILITOT 0.2*  PROT 6.5  ALBUMIN 3.6   No results for input(s): LIPASE, AMYLASE in the last 168 hours. Recent Labs  Lab 10/03/17 2322  AMMONIA 29   Coagulation Profile: Recent Labs  Lab 10/03/17 2017  INR 1.02   Cardiac Enzymes: No results for input(s): CKTOTAL, CKMB,  CKMBINDEX, TROPONINI in the last 168 hours. BNP (last 3 results) No results for input(s): PROBNP in the  last 8760 hours. HbA1C: No results for input(s): HGBA1C in the last 72 hours. CBG: No results for input(s): GLUCAP in the last 168 hours. Lipid Profile: No results for input(s): CHOL, HDL, LDLCALC, TRIG, CHOLHDL, LDLDIRECT in the last 72 hours. Thyroid Function Tests: No results for input(s): TSH, T4TOTAL, FREET4, T3FREE, THYROIDAB in the last 72 hours. Anemia Panel: No results for input(s): VITAMINB12, FOLATE, FERRITIN, TIBC, IRON, RETICCTPCT in the last 72 hours. Urine analysis:    Component Value Date/Time   COLORURINE STRAW (A) 10/04/2017 0132   APPEARANCEUR CLEAR 10/04/2017 0132   LABSPEC 1.013 10/04/2017 0132   PHURINE 7.0 10/04/2017 0132   GLUCOSEU NEGATIVE 10/04/2017 0132   HGBUR NEGATIVE 10/04/2017 0132   BILIRUBINUR NEGATIVE 10/04/2017 0132   KETONESUR NEGATIVE 10/04/2017 0132   PROTEINUR NEGATIVE 10/04/2017 0132   UROBILINOGEN 0.2 03/09/2014 1814   NITRITE NEGATIVE 10/04/2017 0132   LEUKOCYTESUR NEGATIVE 10/04/2017 0132   Sepsis Labs: @LABRCNTIP (procalcitonin:4,lacticidven:4) )No results found for this or any previous visit (from the past 240 hour(s)).   Radiological Exams on Admission: Ct Angio Head W Or Wo Contrast  Result Date: 10/04/2017 CLINICAL DATA:  Ataxia, staggering gait for 2 weeks. Headache for 3-4 days. Intermittent confusion. History of hypertension, hypercholesterolemia. EXAM: CT ANGIOGRAPHY HEAD AND NECK TECHNIQUE: Multidetector CT imaging of the head and neck was performed using the standard protocol during bolus administration of intravenous contrast. Multiplanar CT image reconstructions and MIPs were obtained to evaluate the vascular anatomy. Carotid stenosis measurements (when applicable) are obtained utilizing NASCET criteria, using the distal internal carotid diameter as the denominator. CONTRAST:  59mL ISOVUE-370 IOPAMIDOL (ISOVUE-370)  INJECTION 76% COMPARISON:  MRI of the head October 04, 2017 at 0158 hours and CT HEAD October 03, 2017. FINDINGS: CTA NECK- Mild motion degraded examination. AORTIC ARCH: Normal appearance of the thoracic arch, normal branch pattern. Moderate calcific atherosclerosis. The origins of the innominate, left Common carotid artery and subclavian artery are widely patent. RIGHT CAROTID SYSTEM: Common carotid artery is widely patent, coursing in a straight line fashion. Shelf-like intimal thickening calcific atherosclerosis with 3 mm outpouching and luminal irregularity proximal internal carotid artery. No hemodynamically significant stenosis by NASCET criteria. LEFT CAROTID SYSTEM: Common carotid artery is widely patent, coursing in a straight line fashion. Moderate eccentric calcific atherosclerosis carotid bifurcation without hemodynamically significant stenosis by NASCET criteria. Normal appearance of the internal carotid artery. VERTEBRAL ARTERIES:Mild stenosis RIGHT vertebral artery origin. Codominant vertebral artery's. There is gradual loss of contrast opacification LEFT P2 segment. SKELETON: No acute osseous process though bone windows have not been submitted. Degenerative change of the cervical spine resulting in severe LEFT C4-5, moderate to severe RIGHT C5-6 and C6-7 neural foraminal narrowing. OTHER NECK: Soft tissues of the neck are nonacute though, not tailored for evaluation. UPPER CHEST: Included lung apices are clear. No superior mediastinal lymphadenopathy. Status post median sternotomy for CABG. CTA HEAD ANTERIOR CIRCULATION: Patent cervical internal carotid arteries, petrous, cavernous and supra clinoid internal carotid arteries. Patent anterior communicating artery. Patent anterior and middle cerebral arteries, mild luminal irregularity compatible with atherosclerosis. No large vessel occlusion, significant stenosis, contrast extravasation or aneurysm. POSTERIOR CIRCULATION: Complete loss of contrast  opacification LEFT vertebral artery, with coarse calcifications mid LEFT V4 segment. Mild luminal irregularity RIGHT vertebral artery. Patent RIGHT vertebral artery, vertebrobasilar junction and basilar artery, as well as main branch vessels. Patent posterior cerebral arteries, mild luminal irregularity compatible with atherosclerosis. Robust RIGHT posterior communicating artery present. No large vessel occlusion, significant stenosis, contrast extravasation or aneurysm. VENOUS  SINUSES: Major dural venous sinuses are patent though not tailored for evaluation on this angiographic examination. ANATOMIC VARIANTS: Hypoplastic RIGHT A1 segment. DELAYED PHASE: No abnormal intracranial enhancement. Symmetric vertebral artery enhancement. MIP images reviewed. IMPRESSION: CTA NECK: 1. Gradual loss of LEFT vertebral artery contrast opacification most compatible with dissection. No significant reconstitution in the neck. 2. Atherosclerosis without hemodynamically significant stenosis. 3. 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 CTA HEAD: 1. Loss of LEFT vertebral artery contrast opacification favoring slow flow given CT HEAD and recent MRI appearance. 2. No emergent large vessel occlusion or severe stenosis. Mild intracranial atherosclerosis. Acute findings discussed with and reconfirmed by Dr.[facility]ILL [facility] on 10/04/2017 at 3:32 am. Aortic Atherosclerosis (ICD10-I70.0). Electronically Signed   By: Elon Alas M.D.   On: 10/04/2017 03:30   Ct Head Wo Contrast  Result Date: 10/03/2017 CLINICAL DATA:  Headache EXAM: CT HEAD WITHOUT CONTRAST TECHNIQUE: Contiguous axial images were obtained from the base of the skull through the vertex without intravenous contrast. COMPARISON:  None. FINDINGS: Brain: No acute territorial infarction, hemorrhage or intracranial mass is visualized. Mild atrophy.  Ventricle size within normal limits Vascular: No hyperdense vessels. Carotid vessel calcification. Calcifications in the left vertebral artery Skull: Normal. Negative for fracture or focal lesion. Sinuses/Orbits: Mucosal thickening in the maxillary and ethmoid sinuses. No acute orbital abnormality. Other: None IMPRESSION: No CT evidence for acute intracranial abnormality. Electronically Signed   By: Donavan Foil M.D.   On: 10/03/2017 20:54   Ct Angio Neck W Or Wo Contrast  Result Date: 10/04/2017 CLINICAL DATA:  Ataxia, staggering gait for 2 weeks. Headache for 3-4 days. Intermittent confusion. History of hypertension, hypercholesterolemia. EXAM: CT ANGIOGRAPHY HEAD AND NECK TECHNIQUE: Multidetector CT imaging of the head and neck was performed using the standard protocol during bolus administration of intravenous contrast. Multiplanar CT image reconstructions and MIPs were obtained to evaluate the vascular anatomy. Carotid stenosis measurements (when applicable) are obtained utilizing NASCET criteria, using the distal internal carotid diameter as the denominator. CONTRAST:  78mL ISOVUE-370 IOPAMIDOL (ISOVUE-370) INJECTION 76% COMPARISON:  MRI of the head October 04, 2017 at 0158 hours and CT HEAD October 03, 2017. FINDINGS: CTA NECK- Mild motion degraded examination. AORTIC ARCH: Normal appearance of the thoracic arch, normal branch pattern. Moderate calcific atherosclerosis. The origins of the innominate, left Common carotid artery and subclavian artery are widely patent. RIGHT CAROTID SYSTEM: Common carotid artery is widely patent, coursing in a straight line fashion. Shelf-like intimal thickening calcific atherosclerosis with 3 mm outpouching and luminal irregularity proximal internal carotid artery. No hemodynamically significant stenosis by NASCET criteria. LEFT CAROTID SYSTEM: Common carotid artery is widely patent, coursing in a straight line fashion. Moderate eccentric calcific atherosclerosis  carotid bifurcation without hemodynamically significant stenosis by NASCET criteria. Normal appearance of the internal carotid artery. VERTEBRAL ARTERIES:Mild stenosis RIGHT vertebral artery origin. Codominant vertebral artery's. There is gradual loss of contrast opacification LEFT P2 segment. SKELETON: No acute osseous process though bone windows have not been submitted. Degenerative change of the cervical spine resulting in severe LEFT C4-5, moderate to severe RIGHT C5-6 and C6-7 neural foraminal narrowing. OTHER NECK: Soft tissues of the neck are nonacute though, not tailored for evaluation. UPPER CHEST: Included lung apices are clear. No superior mediastinal lymphadenopathy. Status post median sternotomy for CABG. CTA HEAD ANTERIOR CIRCULATION: Patent cervical internal carotid arteries, petrous, cavernous and supra clinoid internal carotid arteries. Patent anterior communicating artery. Patent anterior  and middle cerebral arteries, mild luminal irregularity compatible with atherosclerosis. No large vessel occlusion, significant stenosis, contrast extravasation or aneurysm. POSTERIOR CIRCULATION: Complete loss of contrast opacification LEFT vertebral artery, with coarse calcifications mid LEFT V4 segment. Mild luminal irregularity RIGHT vertebral artery. Patent RIGHT vertebral artery, vertebrobasilar junction and basilar artery, as well as main branch vessels. Patent posterior cerebral arteries, mild luminal irregularity compatible with atherosclerosis. Robust RIGHT posterior communicating artery present. No large vessel occlusion, significant stenosis, contrast extravasation or aneurysm. VENOUS SINUSES: Major dural venous sinuses are patent though not tailored for evaluation on this angiographic examination. ANATOMIC VARIANTS: Hypoplastic RIGHT A1 segment. DELAYED PHASE: No abnormal intracranial enhancement. Symmetric vertebral artery enhancement. MIP images reviewed. IMPRESSION: CTA NECK: 1. Gradual loss of  LEFT vertebral artery contrast opacification most compatible with dissection. No significant reconstitution in the neck. 2. Atherosclerosis without hemodynamically significant stenosis. 3. 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 CTA HEAD: 1. Loss of LEFT vertebral artery contrast opacification favoring slow flow given CT HEAD and recent MRI appearance. 2. No emergent large vessel occlusion or severe stenosis. Mild intracranial atherosclerosis. Acute findings discussed with and reconfirmed by Dr.[facility] on 10/04/2017 at 3:32 am. Aortic Atherosclerosis (ICD10-I70.0). Electronically Signed   By: Elon Alas M.D.   On: 10/04/2017 03:30   Mr Brain Wo Contrast  Result Date: 10/04/2017 CLINICAL DATA:  Gait disturbance, confusion and blurry vision for 5 days. Three day LEFT-sided headache. History of hypertension, hypercholesterolemia. EXAM: MRI HEAD WITHOUT CONTRAST MRV HEAD WITHOUT CONTRAST TECHNIQUE: Multiplanar, multiecho pulse sequences of the brain and surrounding structures were obtained without intravenous contrast. Angiographic images of the intracranial venous structures were obtained using MRV technique without intravenous contrast. COMPARISON:  CT HEAD October 03, 2017 FINDINGS: MRI HEAD: BRAIN: No reduced diffusion to suggest acute ischemia or hyperacute demyelination. No susceptibility artifact to suggest hemorrhage. The ventricles and sulci are normal for patient's age. No suspicious parenchymal signal, mass or mass effect. No abnormal extra-axial fluid collections. VASCULAR: Normal major intracranial vascular flow voids present at skull base. SKULL AND UPPER CERVICAL SPINE: No abnormal sellar expansion. No suspicious calvarial bone marrow signal. Craniocervical junction maintained. SINUSES/ORBITS: Mild paranasal sinus mucosal thickening without air-fluid levels.  Mastoid air cells are well aerated. The included ocular globes and orbital contents are non-suspicious. Status post bilateral ocular lens implants. OTHER: Patient is edentulous. MRV HEAD: Normal flow related enhancement within the superior sagittal sinus, torcula of the Herophili, bilateral transverse, sigmoid sinuses and included internal jugular veins. Normal flow related enhancement of the internal cerebral veins. IMPRESSION: 1. Normal noncontrast MRI of the head for age. 2. Normal noncontrast MRV head. Electronically Signed   By: Elon Alas M.D.   On: 10/04/2017 02:41   Mr Mrv Head Wo Cm  Result Date: 10/04/2017 CLINICAL DATA:  Gait disturbance, confusion and blurry vision for 5 days. Three day LEFT-sided headache. History of hypertension, hypercholesterolemia. EXAM: MRI HEAD WITHOUT CONTRAST MRV HEAD WITHOUT CONTRAST TECHNIQUE: Multiplanar, multiecho pulse sequences of the brain and surrounding structures were obtained without intravenous contrast. Angiographic images of the intracranial venous structures were obtained using MRV technique without intravenous contrast. COMPARISON:  CT HEAD October 03, 2017 FINDINGS: MRI HEAD: BRAIN: No reduced diffusion to suggest acute ischemia or hyperacute demyelination. No susceptibility artifact to suggest hemorrhage. The ventricles and sulci are normal for patient's age. No suspicious parenchymal signal, mass or mass effect. No abnormal extra-axial fluid collections. VASCULAR:  Normal major intracranial vascular flow voids present at skull base. SKULL AND UPPER CERVICAL SPINE: No abnormal sellar expansion. No suspicious calvarial bone marrow signal. Craniocervical junction maintained. SINUSES/ORBITS: Mild paranasal sinus mucosal thickening without air-fluid levels. Mastoid air cells are well aerated. The included ocular globes and orbital contents are non-suspicious. Status post bilateral ocular lens implants. OTHER: Patient is edentulous. MRV HEAD: Normal flow  related enhancement within the superior sagittal sinus, torcula of the Herophili, bilateral transverse, sigmoid sinuses and included internal jugular veins. Normal flow related enhancement of the internal cerebral veins. IMPRESSION: 1. Normal noncontrast MRI of the head for age. 2. Normal noncontrast MRV head. Electronically Signed   By: Elon Alas M.D.   On: 10/04/2017 02:41    EKG: Independently reviewed.  Normal sinus rhythm.  Assessment/Plan Principal Problem:   Vertebro basilar insufficiency Active Problems:   S/P CABG x 3 11/2011: LIMA TO LAD, SVG TO OM, SVG TO RCA., VG to OM occluded, jeopardized LCX, by cath 08/29/12 -- Re-Do CABG with SVG-OM (09/2012)   HTN (hypertension)   ICM, EF 40% Jan 2013,  50-55% Oct 2013,  40-45% with apical infarct 10/18/12   Vertebral artery dissection (HCC)   Vertebral artery insufficiency    1. Vertebrobasilar insufficiency with dissection of the left vertebral artery -discussed with Dr. Leonel Ramsay on-call neurologist patient will be continued on antiplatelet agents, statins.  Get physical therapy consult.  Check 2D echo.  Neurochecks.  Pain relief.  Check lipid panel and hemoglobin A1c. 2. CAD status post CABG -continue statins and antiplatelet agents and Coreg.  Denies any chest pain. 3. Hypertension -allow for permissive hypertension.  Patient is on hydralazine Imdur Coreg.  Hold hydrochlorothiazide and gently hydrate. 4. Ischemic cardiomyopathy -appears compensated.  I have reviewed patient's old charts and labs.   DVT prophylaxis: Lovenox. Code Status: Full code. Family Communication: Discussed with patient. Disposition Plan: Home. Consults called: Neurology. Admission status: Inpatient.   Rise Patience MD [facility] Pager [phone].  If 7PM-7AM, please contact night-coverage [email or web address] Password [email or web address]  10/04/2017, 4:06 AM

## 2017-10-04 NOTE — ED Notes (Signed)
Attempted report 

## 2017-10-04 NOTE — Progress Notes (Signed)
Chief Complaint: Patient was seen in consultation today for cerebral arteriogram at the request of Dr. Roland Rack  Referring Physician(s): Dr. Roland Rack  Supervising Physician: Luanne Bras  Patient Status: [facility] - In-pt  History of Present Illness: Darrell Goggins. is a 71 y.o. male admitted after work up for posterior and left sided headaches reveals vertebrobasilar insufficiency with possible dissection. He has had CTA and MR and has been seen by Neurology. IR is asked to perform formal carotid/vertebral/cerebral arteriogram. Chart, imaging, meds, labs, reviewed with and by Dr. Estanislado Pandy. Wife at bedside. Underlying hx pertinent for CAD and prior CABG.  Past Medical History:  Diagnosis Date  . Anginal pain ([place])   . Arthritis    "in my back and neck" (Sep 08, 2012)  . CAD, CABG X 09 Nov 2011. re-do CABG X1 09/19/12 12/04/2011  . Coronary artery disease   . DDD (degenerative disc disease), cervical   . Hypercholesterolemia   . Hypertension   . Malaria    "I've had it a couple times in the 1960's; almost died from it" (09/08/12)  . NSTEMI (non-ST elevated myocardial infarction) ([place]) 11/2011   anterior  . NSTEMI (non-ST elevated myocardial infarction) ([place]) 09-08-2012  . PTSD (post-traumatic stress disorder)   . S/P angioplasty with stent,  10/31/12 VG to OM that was new graft from 11/13 in setting of NSTEMI 05/05/2013    Past Surgical History:  Procedure Laterality Date  . BACK SURGERY    . CARDIAC CATHETERIZATION  11/2011  . CATARACT EXTRACTION W/ INTRAOCULAR LENS  IMPLANT, BILATERAL  1990's  . CORONARY ANGIOPLASTY  10/2012   stent placed SVG to OM   . CORONARY ARTERY BYPASS GRAFT  12/02/2011   Procedure: CORONARY ARTERY BYPASS GRAFTING (CABG);  Surgeon: Gaye Pollack, MD;  Location: [place];  Service: Open Heart Surgery;  Laterality: N/A;  . CORONARY ARTERY BYPASS GRAFT  09/19/2012   Procedure: REDO CORONARY ARTERY BYPASS GRAFTING (CABG);   Surgeon: Gaye Pollack, MD;  Location: [place];  Service: Open Heart Surgery;  Laterality: N/A;  . LEFT HEART CATHETERIZATION WITH CORONARY ANGIOGRAM N/A 12/02/2011   Procedure: LEFT HEART CATHETERIZATION WITH CORONARY ANGIOGRAM;  Surgeon: Lorretta Harp, MD;  Location[facility] CATH LAB;  Service: Cardiovascular;  Laterality: N/A;  . LEFT HEART CATHETERIZATION WITH CORONARY ANGIOGRAM N/A 08/30/2012   Procedure: LEFT HEART CATHETERIZATION WITH CORONARY ANGIOGRAM;  Surgeon: Troy Sine, MD;  Location: [facility] CATH LAB;  Service: Cardiovascular;  Laterality: N/A;  . LEFT HEART CATHETERIZATION WITH CORONARY ANGIOGRAM N/A 09/11/2012   Procedure: LEFT HEART CATHETERIZATION WITH CORONARY ANGIOGRAM;  Surgeon: Troy Sine, MD;  Location: [facility] CATH LAB;  Service: Cardiovascular;  Laterality: N/A;  . LEFT HEART CATHETERIZATION WITH CORONARY ANGIOGRAM N/A 05/16/2013   Procedure: LEFT HEART CATHETERIZATION WITH CORONARY ANGIOGRAM;  Surgeon: Lorretta Harp, MD;  Location[facility] CATH LAB;  Service: Cardiovascular;  Laterality: N/A;  . LEFT HEART CATHETERIZATION WITH CORONARY/GRAFT ANGIOGRAM  08/30/2012   Procedure: LEFT HEART CATHETERIZATION WITH Beatrix Fetters;  Surgeon: Troy Sine, MD;  Location: [facility] CATH LAB;  Service: Cardiovascular;;  . LEFT HEART CATHETERIZATION WITH CORONARY/GRAFT ANGIOGRAM N/A 10/04/2012   Procedure: LEFT HEART CATHETERIZATION WITH Beatrix Fetters;  Surgeon: Leonie Man, MD;  Location: [facility] CATH LAB;  Service: Cardiovascular;  Laterality: N/A;  . LEFT HEART CATHETERIZATION WITH CORONARY/GRAFT ANGIOGRAM N/A 10/17/2012   Procedure: LEFT HEART CATHETERIZATION WITH Beatrix Fetters;  Surgeon: Leonie Man, MD;  Location: [facility] CATH LAB;  Service:  Cardiovascular;  Laterality: N/A;  . LUMBAR DISC SURGERY  1980's?  . PERCUTANEOUS CORONARY STENT INTERVENTION (PCI-S) N/A 10/31/2012   Procedure: PERCUTANEOUS CORONARY STENT INTERVENTION (PCI-S);  Surgeon: Lorretta Harp, MD;   Location: [facility] CATH LAB;  Service: Cardiovascular;  Laterality: N/A;  . TONSILLECTOMY     "as a kid" (08/29/2012)  . TRANSTHORACIC ECHOCARDIOGRAM  10/18/2012   LV EF 40%- 45%   . US ECHOCARDIOGRAPHY  02/29/2012    Allergies: Patient has no known allergies.  Medications: Prior to Admission medications   Medication Sig Start Date End Date Taking? Authorizing Provider  amLODipine (NORVASC) 5 MG tablet Take 1 tablet (5 mg total) by mouth daily. 10/25/12  Yes Erlene Quan, PA-C  aspirin EC 325 MG EC tablet Take 1 tablet (325 mg total) by mouth daily. 11/01/12  Yes Brett Canales, PA-C  atorvastatin (LIPITOR) 40 MG tablet Take 20 mg by mouth daily.   Yes [provider]  carvedilol (COREG) 3.125 MG tablet Take 3.125 mg by mouth 2 (two) times daily with a meal.   Yes [provider]  clopidogrel (PLAVIX) 75 MG tablet Take 1 tablet (75 mg total) by mouth daily. 08/05/14  Yes Lorretta Harp, MD  doxycycline (VIBRA-TABS) 100 MG tablet Take 100 mg by mouth 2 (two) times daily.   Yes [provider]  ferrous sulfate 325 (65 FE) MG tablet Take 1 tablet (325 mg total) by mouth 3 (three) times daily with meals. Patient taking differently: Take 325 mg by mouth 2 (two) times daily with a meal.  10/25/12  Yes Kilroy, Luke K, PA-C  finasteride (PROSCAR) 5 MG tablet Take 5 mg by mouth daily.   Yes [provider]  hydrochlorothiazide (HYDRODIURIL) 25 MG tablet Take 25 mg by mouth daily.   Yes [provider]  ibuprofen (ADVIL,MOTRIN) 400 MG tablet Take 400 mg by mouth every 6 (six) hours as needed for mild pain.   Yes [provider]  isosorbide mononitrate (IMDUR) 30 MG 24 hr tablet Take 1 tablet (30 mg total) by mouth daily. 04/23/13  Yes Croitoru, Mihai, MD  lidocaine (LIDODERM) 5 % Place 1 patch onto the skin daily. Remove & Discard patch within 12 hours or as directed by MD   Yes [provider]  nitroGLYCERIN (NITROSTAT) 0.4 MG SL tablet  Place 1 tablet (0.4 mg total) under the tongue every 5 (five) minutes x 3 doses as needed for chest pain. 10/07/12  Yes Brett Canales, PA-C  omeprazole (PRILOSEC) 20 MG capsule Take 20 mg by mouth 2 (two) times daily.   Yes [provider]  potassium chloride SA (K-DUR,KLOR-CON) 20 MEQ tablet Take 20 mEq by mouth 2 (two) times daily.   Yes [provider]  terazosin (HYTRIN) 10 MG capsule Take 10 mg by mouth at bedtime.   Yes [provider]  traMADol (ULTRAM) 50 MG tablet Take 50-100 mg by mouth 3 (three) times daily.   Yes [provider]  oxyCODONE-acetaminophen (PERCOCET) 10-325 MG per tablet Take 1 tablet by mouth every 6 (six) hours as needed for pain. Patient not taking: Reported on 10/03/2017 03/09/14   Delos Haring, PA-C  tamsulosin (FLOMAX) 0.4 MG CAPS capsule Take 1 capsule (0.4 mg total) by mouth daily. Patient not taking: Reported on 10/03/2017 03/09/14   Delos Haring, PA-C     Family History  Problem Relation Age of Onset  . Heart disease Mother   . Diabetes Mother   . Heart disease  Father   . Diabetes Father   . Healthy Sister   . Diabetes Brother   . Diabetes Sister   . Diabetes Brother     Social History   Socioeconomic History  . Marital status: Married    Spouse name: None  . Number of children: None  . Years of education: None  . Highest education level: None  Social Needs  . Financial resource strain: None  . Food insecurity - worry: None  . Food insecurity - inability: None  . Transportation needs - medical: None  . Transportation needs - non-medical: None  Occupational History  . None  Tobacco Use  . Smoking status: Former Smoker    Packs/day: 0.50    Years: 50.00    Pack years: 25.00    Types: Cigarettes    Last attempt to quit: 12/02/2011    Years since quitting: 5.8  . Smokeless tobacco: Never Used  Substance and Sexual Activity  . Alcohol use: No    Comment: 08/29/2012 "haven't had a drink in > 25 years;  never had problem w/it"  . Drug use: No  . Sexual activity: Yes  Other Topics Concern  . 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 History of Agent Orange exposure, followed at [facility]: A 12 point ROS discussed and pertinent positives are indicated in the HPI above.  All other systems are negative.  Review of Systems  Vital Signs: BP (!) 141/63   Pulse (!) 55   Temp 97.8 F (36.6 C) (Oral)   Resp (!) 23   Ht 5\' 10"  (1.778 m)   Wt 178 lb (80.7 kg)   SpO2 98%   BMI 25.54 kg/m   Physical Exam Awake and alert, c/o mostly posterior headache. No focal deficits. Lungs: CTA Heart: Reg Ext: palpable femoral pulses, legs warm.  Imaging: Ct Angio Head W Or Wo Contrast  Result Date: 10/04/2017 CLINICAL DATA:  Ataxia, staggering gait for 2 weeks. Headache for 3-4 days. Intermittent confusion. History of hypertension, hypercholesterolemia. EXAM: CT ANGIOGRAPHY HEAD AND NECK TECHNIQUE: Multidetector CT imaging of the head and neck was performed using the standard protocol during bolus administration of intravenous contrast. Multiplanar CT image reconstructions and MIPs were obtained to evaluate the vascular anatomy. Carotid stenosis measurements (when applicable) are obtained utilizing NASCET criteria, using the distal internal carotid diameter as the denominator. CONTRAST:  20mL ISOVUE-370 IOPAMIDOL (ISOVUE-370) INJECTION 76% COMPARISON:  MRI of the head October 04, 2017 at 0158 hours and CT HEAD October 03, 2017. FINDINGS: CTA NECK- Mild motion degraded examination. AORTIC ARCH: Normal appearance of the thoracic arch, normal branch pattern. Moderate calcific atherosclerosis. The origins of the innominate, left Common carotid artery and subclavian artery are widely patent. RIGHT CAROTID SYSTEM: Common carotid artery is widely patent, coursing in a straight line fashion. Shelf-like intimal thickening calcific atherosclerosis with 3 mm outpouching and luminal  irregularity proximal internal carotid artery. No hemodynamically significant stenosis by NASCET criteria. LEFT CAROTID SYSTEM: Common carotid artery is widely patent, coursing in a straight line fashion. Moderate eccentric calcific atherosclerosis carotid bifurcation without hemodynamically significant stenosis by NASCET criteria. Normal appearance of the internal carotid artery. VERTEBRAL ARTERIES:Mild stenosis RIGHT vertebral artery origin. Codominant vertebral artery's. There is gradual loss of contrast opacification LEFT P2 segment. SKELETON: No acute osseous process though bone windows have not been submitted. Degenerative change of the cervical spine resulting in severe LEFT C4-5, moderate to severe RIGHT C5-6 and  C6-7 neural foraminal narrowing. OTHER NECK: Soft tissues of the neck are nonacute though, not tailored for evaluation. UPPER CHEST: Included lung apices are clear. No superior mediastinal lymphadenopathy. Status post median sternotomy for CABG. CTA HEAD ANTERIOR CIRCULATION: Patent cervical internal carotid arteries, petrous, cavernous and supra clinoid internal carotid arteries. Patent anterior communicating artery. Patent anterior and middle cerebral arteries, mild luminal irregularity compatible with atherosclerosis. No large vessel occlusion, significant stenosis, contrast extravasation or aneurysm. POSTERIOR CIRCULATION: Complete loss of contrast opacification LEFT vertebral artery, with coarse calcifications mid LEFT V4 segment. Mild luminal irregularity RIGHT vertebral artery. Patent RIGHT vertebral artery, vertebrobasilar junction and basilar artery, as well as main branch vessels. Patent posterior cerebral arteries, mild luminal irregularity compatible with atherosclerosis. Robust RIGHT posterior communicating artery present. No large vessel occlusion, significant stenosis, contrast extravasation or aneurysm. VENOUS SINUSES: Major dural venous sinuses are patent though not tailored for  evaluation on this angiographic examination. ANATOMIC VARIANTS: Hypoplastic RIGHT A1 segment. DELAYED PHASE: No abnormal intracranial enhancement. Symmetric vertebral artery enhancement. MIP images reviewed. IMPRESSION: CTA NECK: 1. Gradual loss of LEFT vertebral artery contrast opacification most compatible with dissection. No significant reconstitution in the neck. 2. Atherosclerosis without hemodynamically significant stenosis. 3. 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 CTA HEAD: 1. Loss of LEFT vertebral artery contrast opacification favoring slow flow given CT HEAD and recent MRI appearance. 2. No emergent large vessel occlusion or severe stenosis. Mild intracranial atherosclerosis. Acute findings discussed with and reconfirmed by Dr.MCNEILL [facility] on 10/04/2017 at 3:32 am. Aortic Atherosclerosis (ICD10-I70.0). Electronically Signed   By: Elon Alas M.D.   On: 10/04/2017 03:30   Ct Head Wo Contrast  Result Date: 10/03/2017 CLINICAL DATA:  Headache EXAM: CT HEAD WITHOUT CONTRAST TECHNIQUE: Contiguous axial images were obtained from the base of the skull through the vertex without intravenous contrast. COMPARISON:  None. FINDINGS: Brain: No acute territorial infarction, hemorrhage or intracranial mass is visualized. Mild atrophy. Ventricle size within normal limits Vascular: No hyperdense vessels. Carotid vessel calcification. Calcifications in the left vertebral artery Skull: Normal. Negative for fracture or focal lesion. Sinuses/Orbits: Mucosal thickening in the maxillary and ethmoid sinuses. No acute orbital abnormality. Other: None IMPRESSION: No CT evidence for acute intracranial abnormality. Electronically Signed   By: Donavan Foil M.D.   On: 10/03/2017 20:54   Ct Angio Neck W Or Wo Contrast  Result Date: 10/04/2017 CLINICAL DATA:  Ataxia, staggering gait for 2 weeks.  Headache for 3-4 days. Intermittent confusion. History of hypertension, hypercholesterolemia. EXAM: CT ANGIOGRAPHY HEAD AND NECK TECHNIQUE: Multidetector CT imaging of the head and neck was performed using the standard protocol during bolus administration of intravenous contrast. Multiplanar CT image reconstructions and MIPs were obtained to evaluate the vascular anatomy. Carotid stenosis measurements (when applicable) are obtained utilizing NASCET criteria, using the distal internal carotid diameter as the denominator. CONTRAST:  54mL ISOVUE-370 IOPAMIDOL (ISOVUE-370) INJECTION 76% COMPARISON:  MRI of the head October 04, 2017 at 0158 hours and CT HEAD October 03, 2017. FINDINGS: CTA NECK- Mild motion degraded examination. AORTIC ARCH: Normal appearance of the thoracic arch, normal branch pattern. Moderate calcific atherosclerosis. The origins of the innominate, left Common carotid artery and subclavian artery are widely patent. RIGHT CAROTID SYSTEM: Common carotid artery is widely patent, coursing in a straight line fashion. Shelf-like intimal thickening calcific atherosclerosis with 3 mm outpouching and luminal irregularity proximal internal carotid artery. No hemodynamically significant stenosis by NASCET  criteria. LEFT CAROTID SYSTEM: Common carotid artery is widely patent, coursing in a straight line fashion. Moderate eccentric calcific atherosclerosis carotid bifurcation without hemodynamically significant stenosis by NASCET criteria. Normal appearance of the internal carotid artery. VERTEBRAL ARTERIES:Mild stenosis RIGHT vertebral artery origin. Codominant vertebral artery's. There is gradual loss of contrast opacification LEFT P2 segment. SKELETON: No acute osseous process though bone windows have not been submitted. Degenerative change of the cervical spine resulting in severe LEFT C4-5, moderate to severe RIGHT C5-6 and C6-7 neural foraminal narrowing. OTHER NECK: Soft tissues of the neck are nonacute  though, not tailored for evaluation. UPPER CHEST: Included lung apices are clear. No superior mediastinal lymphadenopathy. Status post median sternotomy for CABG. CTA HEAD ANTERIOR CIRCULATION: Patent cervical internal carotid arteries, petrous, cavernous and supra clinoid internal carotid arteries. Patent anterior communicating artery. Patent anterior and middle cerebral arteries, mild luminal irregularity compatible with atherosclerosis. No large vessel occlusion, significant stenosis, contrast extravasation or aneurysm. POSTERIOR CIRCULATION: Complete loss of contrast opacification LEFT vertebral artery, with coarse calcifications mid LEFT V4 segment. Mild luminal irregularity RIGHT vertebral artery. Patent RIGHT vertebral artery, vertebrobasilar junction and basilar artery, as well as main branch vessels. Patent posterior cerebral arteries, mild luminal irregularity compatible with atherosclerosis. Robust RIGHT posterior communicating artery present. No large vessel occlusion, significant stenosis, contrast extravasation or aneurysm. VENOUS SINUSES: Major dural venous sinuses are patent though not tailored for evaluation on this angiographic examination. ANATOMIC VARIANTS: Hypoplastic RIGHT A1 segment. DELAYED PHASE: No abnormal intracranial enhancement. Symmetric vertebral artery enhancement. MIP images reviewed. IMPRESSION: CTA NECK: 1. Gradual loss of LEFT vertebral artery contrast opacification most compatible with dissection. No significant reconstitution in the neck. 2. Atherosclerosis without hemodynamically significant stenosis. 3. 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 CTA HEAD: 1. Loss of LEFT vertebral artery contrast opacification favoring slow flow given CT HEAD and recent MRI appearance. 2. No emergent large vessel occlusion or severe stenosis. Mild intracranial atherosclerosis.  Acute findings discussed with and reconfirmed by Dr.MCNEILL [facility] on 10/04/2017 at 3:32 am. Aortic Atherosclerosis (ICD10-I70.0). Electronically Signed   By: Elon Alas M.D.   On: 10/04/2017 03:30   Mr Brain Wo Contrast  Result Date: 10/04/2017 CLINICAL DATA:  Gait disturbance, confusion and blurry vision for 5 days. Three day LEFT-sided headache. History of hypertension, hypercholesterolemia. EXAM: MRI HEAD WITHOUT CONTRAST MRV HEAD WITHOUT CONTRAST TECHNIQUE: Multiplanar, multiecho pulse sequences of the brain and surrounding structures were obtained without intravenous contrast. Angiographic images of the intracranial venous structures were obtained using MRV technique without intravenous contrast. COMPARISON:  CT HEAD October 03, 2017 FINDINGS: MRI HEAD: BRAIN: No reduced diffusion to suggest acute ischemia or hyperacute demyelination. No susceptibility artifact to suggest hemorrhage. The ventricles and sulci are normal for patient's age. No suspicious parenchymal signal, mass or mass effect. No abnormal extra-axial fluid collections. VASCULAR: Normal major intracranial vascular flow voids present at skull base. SKULL AND UPPER CERVICAL SPINE: No abnormal sellar expansion. No suspicious calvarial bone marrow signal. Craniocervical junction maintained. SINUSES/ORBITS: Mild paranasal sinus mucosal thickening without air-fluid levels. Mastoid air cells are well aerated. The included ocular globes and orbital contents are non-suspicious. Status post bilateral ocular lens implants. OTHER: Patient is edentulous. MRV HEAD: Normal flow related enhancement within the superior sagittal sinus, torcula of the Herophili, bilateral transverse, sigmoid sinuses and included internal jugular veins. Normal flow related enhancement of the internal cerebral veins. IMPRESSION: 1. Normal noncontrast MRI of the  head for age. 2. Normal noncontrast MRV head. Electronically Signed   By: Elon Alas M.D.   On:  10/04/2017 02:41   Mr Mrv Head Wo Cm  Result Date: 10/04/2017 CLINICAL DATA:  Gait disturbance, confusion and blurry vision for 5 days. Three day LEFT-sided headache. History of hypertension, hypercholesterolemia. EXAM: MRI HEAD WITHOUT CONTRAST MRV HEAD WITHOUT CONTRAST TECHNIQUE: Multiplanar, multiecho pulse sequences of the brain and surrounding structures were obtained without intravenous contrast. Angiographic images of the intracranial venous structures were obtained using MRV technique without intravenous contrast. COMPARISON:  CT HEAD October 03, 2017 FINDINGS: MRI HEAD: BRAIN: No reduced diffusion to suggest acute ischemia or hyperacute demyelination. No susceptibility artifact to suggest hemorrhage. The ventricles and sulci are normal for patient's age. No suspicious parenchymal signal, mass or mass effect. No abnormal extra-axial fluid collections. VASCULAR: Normal major intracranial vascular flow voids present at skull base. SKULL AND UPPER CERVICAL SPINE: No abnormal sellar expansion. No suspicious calvarial bone marrow signal. Craniocervical junction maintained. SINUSES/ORBITS: Mild paranasal sinus mucosal thickening without air-fluid levels. Mastoid air cells are well aerated. The included ocular globes and orbital contents are non-suspicious. Status post bilateral ocular lens implants. OTHER: Patient is edentulous. MRV HEAD: Normal flow related enhancement within the superior sagittal sinus, torcula of the Herophili, bilateral transverse, sigmoid sinuses and included internal jugular veins. Normal flow related enhancement of the internal cerebral veins. IMPRESSION: 1. Normal noncontrast MRI of the head for age. 2. Normal noncontrast MRV head. Electronically Signed   By: Elon Alas M.D.   On: 10/04/2017 02:41    Labs:  CBC: Recent Labs    10/03/17 2017 10/03/17 2040 10/04/17 0426  WBC 10.2  --  8.1  HGB 14.3 14.6 13.1  HCT 41.3 43.0 39.5  PLT 170  --  147*     COAGS: Recent Labs    10/03/17 2017  INR 1.02  APTT 31    BMP: Recent Labs    10/03/17 2017 10/03/17 2040 10/04/17 0426  NA 138 143  --   K 3.3* 3.6  --   CL 105 104  --   CO2 25  --   --   GLUCOSE 87 88  --   BUN 23* 24*  --   CALCIUM 9.0  --   --   CREATININE 0.94 0.80 0.85  GFRNONAA >60  --  >60  GFRAA >60  --  >60    LIVER FUNCTION TESTS: Recent Labs    10/03/17 2017  BILITOT 0.2*  AST 20  ALT 20  ALKPHOS 68  PROT 6.5  ALBUMIN 3.6    TUMOR MARKERS: No results for input(s): AFPTM, CEA, CA199, CHROMGRNA in the last 8760 hours.  Assessment and Plan: Clinical and radiographic findings concerning for vertebrobasilar insufficiency and dissection. Pt to be admitted. Plan for formal carotid/vertebral/arteriogram tomorrow. NPO p MN. Labs reviewed. Risks and benefits of cerebral angio were discussed with the patient including, but not limited to bleeding, infection, vascular injury or contrast induced renal failure.  This interventional procedure involves the use of X-rays and because of the nature of the planned procedure, it is possible that we will have prolonged use of X-ray fluoroscopy.  Potential radiation risks to you include (but are not limited to) the following: - A slightly elevated risk for cancer  several years later in life. This risk is typically less than 0.5% percent. This risk is low in comparison to the normal incidence of human cancer, which is 33% for women and  50% for men according to the [place]. - Radiation induced injury can include skin redness, resembling a rash, tissue breakdown / ulcers and hair loss (which can be temporary or permanent).   The likelihood of either of these occurring depends on the difficulty of the procedure and whether you are sensitive to radiation due to previous procedures, disease, or genetic conditions.   IF your procedure requires a prolonged use of radiation, you will be notified and  given written instructions for further action.  It is your responsibility to monitor the irradiated area for the 2 weeks following the procedure and to notify your physician if you are concerned that you have suffered a radiation induced injury.    All of the patient's questions were answered, patient is agreeable to proceed.  Consent signed and in chart.    Thank you for this interesting consult.  I greatly enjoyed meeting [name]. and look forward to participating in their care.  A copy of this report was sent to the requesting provider on this date.  Electronically Signed: Ascencion Dike, PA-C 10/04/2017, 2:28 PM   I spent a total of 25 minutes in face to face in clinical consultation, greater than 50% of which was counseling/coordinating care for cerebral angiogram

## 2017-10-05 ENCOUNTER — Other Ambulatory Visit: Payer: Self-pay

## 2017-10-05 ENCOUNTER — Inpatient Hospital Stay (HOSPITAL_COMMUNITY): Payer: Medicare Other

## 2017-10-05 DIAGNOSIS — I340 Nonrheumatic mitral (valve) insufficiency: Secondary | ICD-10-CM

## 2017-10-05 HISTORY — PX: IR ANGIO VERTEBRAL SEL VERTEBRAL BILAT MOD SED: IMG5369

## 2017-10-05 HISTORY — PX: IR ANGIO INTRA EXTRACRAN SEL COM CAROTID INNOMINATE BILAT MOD SED: IMG5360

## 2017-10-05 LAB — CBC
HCT: 40.9 % (ref 39.0–52.0)
HEMOGLOBIN: 14 g/dL (ref 13.0–17.0)
MCH: 33 pg (ref 26.0–34.0)
MCHC: 34.2 g/dL (ref 30.0–36.0)
MCV: 96.5 fL (ref 78.0–100.0)
PLATELETS: 144 10*3/uL — AB (ref 150–400)
RBC: 4.24 MIL/uL (ref 4.22–5.81)
RDW: 12.9 % (ref 11.5–15.5)
WBC: 11.8 10*3/uL — AB (ref 4.0–10.5)

## 2017-10-05 LAB — BASIC METABOLIC PANEL
ANION GAP: 5 (ref 5–15)
BUN: 17 mg/dL (ref 6–20)
CHLORIDE: 108 mmol/L (ref 101–111)
CO2: 26 mmol/L (ref 22–32)
Calcium: 8.7 mg/dL — ABNORMAL LOW (ref 8.9–10.3)
Creatinine, Ser: 0.85 mg/dL (ref 0.61–1.24)
Glucose, Bld: 92 mg/dL (ref 65–99)
POTASSIUM: 3.7 mmol/L (ref 3.5–5.1)
SODIUM: 139 mmol/L (ref 135–145)

## 2017-10-05 LAB — ECHOCARDIOGRAM COMPLETE
Height: 70 in
WEIGHTICAEL: 2848 [oz_av]

## 2017-10-05 MED ORDER — HEPARIN SODIUM (PORCINE) 1000 UNIT/ML IJ SOLN
INTRAMUSCULAR | Status: AC | PRN
  Administered 2017-10-05: 1000 [IU] via INTRAVENOUS

## 2017-10-05 MED ORDER — FENTANYL CITRATE (PF) 100 MCG/2ML IJ SOLN
INTRAMUSCULAR | Status: AC
  Filled 2017-10-05: qty 2

## 2017-10-05 MED ORDER — FENTANYL CITRATE (PF) 100 MCG/2ML IJ SOLN
INTRAMUSCULAR | Status: AC | PRN
  Administered 2017-10-05 (×2): 25 ug via INTRAVENOUS

## 2017-10-05 MED ORDER — IOPAMIDOL (ISOVUE-300) INJECTION 61%
INTRAVENOUS | Status: AC
  Administered 2017-10-05: 80 mL
  Filled 2017-10-05: qty 150

## 2017-10-05 MED ORDER — LIDOCAINE HCL (PF) 1 % IJ SOLN
INTRAMUSCULAR | Status: AC | PRN
  Administered 2017-10-05: 20 mL

## 2017-10-05 MED ORDER — MIDAZOLAM HCL 2 MG/2ML IJ SOLN
INTRAMUSCULAR | Status: AC | PRN
  Administered 2017-10-05: 1 mg via INTRAVENOUS

## 2017-10-05 MED ORDER — MIDAZOLAM HCL 2 MG/2ML IJ SOLN
INTRAMUSCULAR | Status: AC
  Filled 2017-10-05: qty 2

## 2017-10-05 MED ORDER — SODIUM CHLORIDE 0.9 % IV SOLN: INTRAVENOUS | Status: DC

## 2017-10-05 MED ORDER — LIDOCAINE HCL 1 % IJ SOLN
INTRAMUSCULAR | Status: AC
  Filled 2017-10-05: qty 20

## 2017-10-05 MED ORDER — IOPAMIDOL (ISOVUE-300) INJECTION 61%
INTRAVENOUS | Status: AC
  Administered 2017-10-05: 15 mL
  Filled 2017-10-05: qty 50

## 2017-10-05 MED ORDER — HEPARIN SODIUM (PORCINE) 1000 UNIT/ML IJ SOLN
INTRAMUSCULAR | Status: AC
  Filled 2017-10-05: qty 1

## 2017-10-05 NOTE — Care Management Note (Signed)
Case Management Note  Patient Details  Name: [name]. MRN: [id number] Date of Birth: 1946-07-11  Subjective/Objective:      Pt admitted with vertebro basilar insufficiency. He is from home with his spouse.              Action/Plan: Awaiting PT/OT recommendations. CM following for d/c needs, physician orders.   Expected Discharge Date:                  Expected Discharge Plan:     In-House Referral:     Discharge planning Services     Post Acute Care Choice:    Choice offered to:     DME Arranged:    DME Agency:     HH Arranged:    HH Agency:     Status of Service:  In process, will continue to follow  If discussed at Long Length of Stay Meetings, dates discussed:    Additional Comments:  Pollie Friar, RN 10/05/2017, 11:34 AM

## 2017-10-05 NOTE — Progress Notes (Signed)
2D Echocardiogram has been performed.  [name] 10/05/2017, 4:38 PM

## 2017-10-05 NOTE — Sedation Documentation (Signed)
Patient is resting comfortably. 

## 2017-10-05 NOTE — Evaluation (Addendum)
Physical Therapy Evaluation and Discharge  Patient Details Name: [name]. MRN: [id number] DOB: 16-Sep-1946 Today's Date: 10/05/2017   History of Present Illness  [name]. is a 71 y.o. male with PMH of CAD, HTN, HLD who presents with with progressive gait disturbance, posterior headache/Neck pain x 3+ weeks. Patient describes intermittent episodes of ataxia, lasting 30 -90 minutes, occurring 3-4x/day. MRI negative for stroke. 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  Clinical Impression  Patient evaluated by Physical Therapy with no further acute PT needs identified. All education on BE [facility] stroke symptomology has been completed and the patient has no further questions. Pt is mod I for bed mobility and transfers and supervision for ambulation and ascent/descent of 20 steps. See below for any follow-up Physical Therapy or equipment needs. PT is signing off. Thank you for this referral.     Follow Up Recommendations No PT follow up    Equipment Recommendations  None recommended by PT    Recommendations for Other Services       Precautions / Restrictions Precautions Precautions: None Restrictions Weight Bearing Restrictions: No      Mobility  Bed Mobility Overal bed mobility: Modified Independent             General bed mobility comments:  HoB elevated use of bedrail  Transfers Overall transfer level: Modified independent Equipment used: None             General transfer comment: RN in room to assess groin after IR and bed rest, pt anxious to get out of bed, setting off bed alarm, pt required cuing to slow down for assessment before jumping up, good powerup and steadying in upright  Ambulation/Gait Ambulation/Gait assistance: Supervision Ambulation Distance (Feet): 250 Feet Assistive device: None Gait Pattern/deviations: WFL(Within Functional Limits) Gait velocity: increased Gait velocity  interpretation: at or above normal speed for age/gender General Gait Details: steady gait with increased cadence pt reports it is his normal gait  Stairs Stairs: Yes Stairs assistance: Supervision Stair Management: One rail Right;Forwards;Alternating pattern Number of Stairs: 20 General stair comments: strong, steady ascent/descent  Modified Rankin (Stroke Patients Only) Modified Rankin (Stroke Patients Only) Pre-Morbid Rankin Score: No symptoms Modified Rankin: No symptoms     Balance Overall balance assessment: No apparent balance deficits (not formally assessed)                                           Pertinent Vitals/Pain Pain Assessment: No/denies pain    Home Living Family/patient expects to be discharged to:: Private residence Living Arrangements: Spouse/significant other Available Help at Discharge: Family;Available 24 hours/day Type of Home: House Home Access: Ramped entrance     Home Layout: Two level Home Equipment: Grab bars - toilet;Shower seat;Walker - 2 wheels;Cane - single point;Bedside commode;Hand held shower head      Prior Function Level of Independence: Independent         Comments: community, ambulator, driver, independent with ADLs, iADLs     Hand Dominance   Dominant Hand: Right    Extremity/Trunk Assessment   Upper Extremity Assessment Upper Extremity Assessment: Defer to OT evaluation    Lower Extremity Assessment Lower Extremity Assessment: Overall WFL for tasks assessed(5/5 strength in bilateral LE, sensation intact, coordination)       Communication   Communication: No difficulties  Cognition Arousal/Alertness: Awake/alert Behavior During Therapy: Impulsive Overall Cognitive Status: Within Functional Limits for tasks assessed                                        General Comments General comments (skin integrity, edema, etc.): fiance in room with questions about testing directed to  cardiovascular provider and nurse        Assessment/Plan    PT Assessment Patent does not need any further PT services         PT Goals (Current goals can be found in the Care Plan section)  Acute Rehab PT Goals Patient Stated Goal: go home PT Goal Formulation: With patient     AM-PAC PT "6 Clicks" Daily Activity  Outcome Measure Difficulty turning over in bed (including adjusting bedclothes, sheets and blankets)?: None Difficulty moving from lying on back to sitting on the side of the bed? : None Difficulty sitting down on and standing up from a chair with arms (e.g., wheelchair, bedside commode, etc,.)?: None Help needed moving to and from a bed to chair (including a wheelchair)?: None Help needed walking in hospital room?: None Help needed climbing 3-5 steps with a railing? : None 6 Click Score: 24    End of Session Equipment Utilized During Treatment: Gait belt Activity Tolerance: Patient tolerated treatment well Patient left: with family/visitor present;in bed(OT present ) Nurse Communication: Mobility status      Time: 4650-3546 PT Time Calculation (min) (ACUTE ONLY): 17 min   Charges:   PT Evaluation $PT Eval Moderate Complexity: 1 Mod     PT G Codes:        Shahana Capes B. Migdalia Dk PT, DPT Acute Rehabilitation  [phone] Pager [phone]    [place] 10/05/2017, 2:55 PM

## 2017-10-05 NOTE — Sedation Documentation (Signed)
IR tech holding pressure to R groin 

## 2017-10-05 NOTE — Progress Notes (Signed)
MD called to verify patient would not be discharged today.  Pt and wife were under the assumption he would discharge.  Pt was not pleased but agreed to stay.

## 2017-10-05 NOTE — Progress Notes (Signed)
OT Cancellation Note  Patient Details Name: [name]. MRN: [id number] DOB: [birth year]   Cancelled Treatment:    Reason Eval/Treat Not Completed: Patient not medically ready; Pt currently with active bed rest orders. Will follow up for OT eval as schedule permits.   Lou Cal, OT Pager [phone] 10/05/2017   Raymondo Band 10/05/2017, 12:54 PM

## 2017-10-05 NOTE — Sedation Documentation (Signed)
Pressure hold complete, dsg R groin

## 2017-10-05 NOTE — Sedation Documentation (Signed)
Pressure hold to R groin continues

## 2017-10-05 NOTE — Progress Notes (Signed)
STROKE TEAM PROGRESS NOTE  Admission History: Darrell Tramell. is a 71 y.o. male with a history of CAD, hypertension, hyperlipidemia who presents with unsteady gait for 2 weeks.  He states that he feels unbalanced whenever he tries to walk.  He denies numbness or weakness.  He denies vertigo.  He does state that he feels like he is not hearing as well out of his left ear as he typically does.  His wife states that he has been consistently falling to the left.  Over the past 3 days, he has noticed that he has a left-sided headache which is at the left occipital area radiating forward and onto his neck.  He does feel that he has had some diplopia, but this is not persistent.  LKW: 2 weeks ago tpa given?: no, outside of window  SUBJECTIVE (INTERVAL HISTORY) No family  is at the bedside. Patient is found laying in bed in NAD  Overall he feels his condition is improving.  Voices no new complaints. No new events reported overnight. Denies any worsening of his symptoms. Reviewed results with patient: MRI Cervical negative for stenosis. IR Angio shows no need for intervention at this time  OBJECTIVE Lab Results: CBC:  Recent Labs  Lab 10/03/17 2017 10/03/17 2040 10/04/17 0426 10/05/17 0415  WBC 10.2  --  8.1 11.8*  HGB 14.3 14.6 13.1 14.0  HCT 41.3 43.0 39.5 40.9  MCV 96.7  --  97.5 96.5  PLT 170  --  147* 144*   BMP: Recent Labs  Lab 10/03/17 2017 10/03/17 2040 10/04/17 0426 10/05/17 0415  NA 138 143  --  139  K 3.3* 3.6  --  3.7  CL 105 104  --  108  CO2 25  --   --  26  GLUCOSE 87 88  --  92  BUN 23* 24*  --  17  CREATININE 0.94 0.80 0.85 0.85  CALCIUM 9.0  --   --  8.7*   Liver Function Tests:  Recent Labs  Lab 10/03/17 2017  AST 20  ALT 20  ALKPHOS 68  BILITOT 0.2*  PROT 6.5  ALBUMIN 3.6   Recent Labs  Lab 10/03/17 2322  AMMONIA 29   Coagulation Studies:  Recent Labs    10/03/17 2017  APTT 31  INR 1.02   Urinalysis:  Recent Labs  Lab  10/04/17 0132  COLORURINE STRAW*  APPEARANCEUR CLEAR  LABSPEC 1.013  PHURINE 7.0  GLUCOSEU NEGATIVE  HGBUR NEGATIVE  BILIRUBINUR NEGATIVE  KETONESUR NEGATIVE  PROTEINUR NEGATIVE  NITRITE NEGATIVE  LEUKOCYTESUR NEGATIVE   PHYSICAL EXAM Temp:  [98.1 F (36.7 C)-98.9 F (37.2 C)] 98.7 F (37.1 C) (11/30 1015) Pulse Rate:  [49-60] 53 (11/30 1015) Resp:  [14-18] 18 (11/30 1045) BP: (134-180)/(60-81) 167/80 (11/30 1045) SpO2:  [96 %-100 %] 100 % (11/30 1015) General - Well nourished, well developed, in no apparent distress Respiratory - Lungs clear bilaterally. No wheezing. Cardiovascular - Regular rate and rhythm   Neuro: Mental Status: Patient is awake, alert, oriented to person, place, month, year, and situation. Patient is able to give a clear and coherent history. No signs of aphasia or neglect Cranial Nerves: II: Visual Fields are full. Pupils are equal, round, and reactive to light.   III,IV, VI: EOMI without ptosis or diploplia.  V: Facial sensation is symmetric to temperature VII: Facial movement is symmetric.  VIII: hearing is intact to voice X: Uvula elevates symmetrically XI: Shoulder shrug is symmetric. XII: tongue  is midline without atrophy or fasciculations.  Motor: Tone is normal. Bulk is normal. 5/5 strength was present in all four extremities.  Sensory: Sensation is symmetric to light touch and temperature in the arms and legs. Cerebellar: He is slightly slower with finger-nose-finger and heel-knee-shin on the left compared to right  IMAGING: I have personally reviewed the radiological images below and agree with the radiology interpretations.  Ct Head Wo Contrast Result Date: 10/03/2017 IMPRESSION: No CT evidence for acute intracranial abnormality.  Ct Angio Head & Neck W Or Wo Contrast Result Date: 10/04/2017 IMPRESSION: CTA NECK: 1. Gradual loss of LEFT vertebral artery contrast opacification most compatible with dissection. No significant  reconstitution in the neck. 2. Atherosclerosis without hemodynamically significant stenosis. 3. 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 CTA HEAD: 1. Loss of LEFT vertebral artery contrast opacification favoring slow flow given CT HEAD and recent MRI appearance. 2. No emergent large vessel occlusion or severe stenosis. Mild intracranial atherosclerosis. Aortic Atherosclerosis (ICD10-I70.0).  Mr Mrv Head Wo Cm Result Date: 10/04/2017 IMPRESSION: 1. Normal noncontrast MRI of the head for age. 2. Normal noncontrast MRV head.   MRI Cervical IMPRESSION: 1. No canal stenosis or evidence of myelopathy. 2. Multilevel foraminal narrowing with quantification limited by Motion  Echocardiogram:                                        REPORT PENDING  IR Angiogram     IR Angiogram                                                        S/P 4 vessel cerebral arteriogram. RT CFA approach. Findings. 1.Occluded LT VA at C1. 2.Irregular 7.31mm x 6.96mm aneurysm of the  prox RT ICA associated with 10 to 20 % stenosis    _____________________________________________________________________ ASSESSMENT: [name]. is a 71 y.o. male with PMH of CAD, HTN, HLD who presents with with progressive gait disturbance, posterior headache/Neck pain x 3+ weeks. Patient describes intermittent episodes of ataxia, lasting 30 -90 minutes, occurring 3-4x/day. MRI negative for stroke. 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  10/04/17: Neuro exam stable with no worsening of symptoms, Patient describes the same symptoms occurring intermittently throughout the day. Will order MRI to evaluate cervical spine and IR Angio in AM to evaluate vertebral artery occlusion. Increased Lipitor home dose to 40 mg. Will continue ASA and Plavix for now.   He has presented with  two-week history of intermittent transient episodes of vertebrobasilar TIAs and gait ataxia to the left likely occlusive vertebrobasilar disease. Recommend check diagnostic catheters cerebral angiogram to evaluate for any treatable lesion. Check MRI scan cervical spine to look for significant compression. Dual antiplatelet therapy and aggressive risk factor modification.  10/05/17: IR Angiogram today. No need for intervention at this time. MRI Cervical negative for stenosis.  Will need clearance by PT/OT to be discharged home. Feeling better. States he has had no episodes of dizziness since admission. Continue Stroke plan with close PCP follow up.  STROKE:  Suspected Etiology: likely chronic vertebral artery occlusion with sub-optimal compensation from  ie: dehydration, Hypo/hyper tension, smoking Resultant Symptoms: Intermittent Ataxia Stroke Risk Factors: carotid stenosis, hyperlipidemia and hypertension Other Stroke Risk Factors: Advanced age, Cigarette smoker, Coronary artery disease  Outstanding Stroke Work-up Studies: Echocardiogram:                                                REPORT  PENDING  IR Angiogram                                                        S/P 4 vessel cerebral arteriogram. RT CFA approach. Findings. 1.Occluded LT VA at C1. 2.Irregular 7.75mm x 6.54mm aneurysm of the  prox RT ICA associated with 10 to 20 % stenosis No need for surgical intervention at this time. Follow up with Dr Estanislado Pandy in 6 weeks  PLAN  10/05/2017: Continue Aspirin/ Plavix/ Statin  IR Angiogram with Dr Patrecia Pour today - no intervention at this time PT/OT/SLP Ongoing aggressive stroke risk factor management Patient counseled to be compliant with his antithrombotic medications  INTRACRANIAL Atherosclerosis &Stenosis: On DAPT, continue for 3 months and then Plavix alone  FLUID/ELECTROLYTE DISORDERS: Hypo Potassium - Replacement per Medicine team  HYPERTENSION: Stable - Avoid Hypotension  or Accelerated HTN Permissive hypertension (OK if <220/120) for 24-48 hours post stroke and then gradually normalized within 5-7 days. Long term BP goal normotensive. May slowly restart home B/P medications after 48 hours Home Meds: Norvasc, Coreg, HCTZ, Imdur, Hytrin  HYPERLIPIDEMIA:    Component Value Date/Time   CHOL 143 10/04/2017 0426   TRIG 97 10/04/2017 0426   HDL 35 (L) 10/04/2017 0426   CHOLHDL 4.1 10/04/2017 0426   VLDL 19 10/04/2017 0426   LDLCALC 89 10/04/2017 0426  Home Meds:  Lipitor 20 mg LDL  goal < 70 Increased to Lipitor to 40 mg daily Continue statin at discharge  R/O DIABETES: Lab Results  Component Value Date   HGBA1C 5.6 10/04/2017  HgbA1c goal < 7.0  TOBACCO ABUSE Current smoker - States quit this week Smoking cessation counseling provided Nicotine patch provided PRN  Other Active Problems: Principal Problem:   Vertebro basilar insufficiency Active Problems:   S/P CABG x 3 11/2011: LIMA TO LAD, SVG TO OM, SVG TO RCA., VG to OM occluded, jeopardized LCX, by cath 08/29/12 -- Re-Do CABG with SVG-OM (09/2012)   HTN (hypertension)   ICM, EF 40% Jan 2013,  50-55% Oct 2013,  40-45% with apical infarct 10/18/12   Vertebral artery dissection (HCC)   Vertebral artery insufficiency  Hospital day # 1  VTE prophylaxis:  SCD's  Diet : Diet Heart Room service appropriate? Yes; Fluid consistency: Thin   Prior Home Stroke Medications:  aspirin 325 mg daily, clopidogrel 75 mg daily and Lipitor 20 mg  Hospital Current Stroke Medications: ASA 325mg , Plavix 75 mg and Lipitor 40 mg Stroke New Meds Plan: Now on aspirin 325 mg daily, clopidogrel 75 mg daily and Lipitor 40 mg  Discharge Stroke Meds: Please discharge patient on aspirin 325 mg daily, clopidogrel 75 mg daily and Lipitor 40 mg   Disposition: 01-Home or Self Care Therapy Recs:  HOME Follow Recs:  Follow-up Information    Garvin Fila, MD. Schedule an appointment as  soon as possible for a visit in 6  week(s).   Specialties:  Neurology, Radiology Contact information: [address] [place] [address] [place], [place]- PCP in 2-3 weeks Follow up with [facility] in 6 weeks Follow up with Dr Estanislado Pandy in 6 weeks  FAMILY UPDATES: No family at bedside  TEAM UPDATES: Kayleen Memos, DO  Renie Ora Stroke Neurology Team 10/05/2017 2:14 PM I have personally examined this patient, reviewed notes, independently viewed imaging studies, participated in medical decision making and plan of care.ROS completed by me personally and pertinent positives fully documented  I have made any additions or clarifications directly to the above note. Agree with note above.   Antony Contras, MD Medical Director [facility] Pager: [phone] 10/05/2017 3:08 PM  To contact Stroke Continuity provider, please refer to [email or web address] After hours, contact General Neurology

## 2017-10-05 NOTE — Progress Notes (Signed)
PT Cancellation Note  Patient Details Name: [name]. MRN: [id number] DOB: 10/12/1946   Cancelled Treatment:    Reason Eval/Treat Not Completed: (P) Medical issues which prohibited therapy Pt is on bed rest until 2:00 pm s/p IR. PT will follow back for evaluation this afternoon. Thanks,  Dani Gobble. Migdalia Dk PT, DPT Acute Rehabilitation  [phone] Pager [phone]     [place] 10/05/2017, 11:00 AM

## 2017-10-05 NOTE — Procedures (Signed)
S/P 4 vessel cerebral arteriogram. RT CFA approach. Findings. 1.Occluded LT VA at C1. 2.Irregular 7.93mm x 6.52mm aneurysm of the  prox RT ICA associated with 10 to 20 % stenosis

## 2017-10-05 NOTE — Progress Notes (Signed)
PROGRESS NOTE  [name]. YPP:[id number] DOB: Mar 15, 1946 DOA: 10/03/2017 PCP: [facility] [place]  HPI/Recap of past 24 hours: [name] is a 71 yo M with [place] significant for CAD s/p CABG, PCI with satent, HTN, HLD, who presented to the ED [facility] with complaints of unsteady gait of 2 weeks duration associated with headaches. likely chronic vertebral artery occlusion with sub-optimal compensation from ie: dehydration, Hypo/hyper tension, smoking.  Patient was seen and examined at her bedside.  He reports headache has nearly resolved. Headache cocktail was very helpful. No other complaints at this time.    Assessment/Plan: Principal Problem:   Vertebro basilar insufficiency Active Problems:   S/P CABG x 3 11/2011: LIMA TO LAD, SVG TO OM, SVG TO RCA., VG to OM occluded, jeopardized LCX, by cath 08/29/12 -- Re-Do CABG with SVG-OM (09/2012)   HTN (hypertension)   ICM, EF 40% Jan 2013,  50-55% Oct 2013,  40-45% with apical infarct 10/18/12   Vertebral artery dissection (HCC)   Vertebral artery insufficiency   Ataxia most likely 2/2 to non compensated chronic vertebral occlusion  -Neurology following -continue asa, clopidogrel, lipitor -MRI to evaluate cervical spine -IR angio done today 10/05/17 to evaluate artery occlusion per neuro -PT/OT evaluate and treat  Intractable headache -headache cocktail prn -IV toradol, IV benadryl, IV reglan  HLD  -LDL 89 -continue statin  HTN -stable -coreg, amlodipine, proscar, terazosin  Hypokalemia, resolved -replete as indicated -bmp am  BPH -proscar, terazosin  Chronic anemia -stable hg 13.1 -ferrous sulfate -cbc  GERD -stable -protonix 40 mg po daily  Tobacco use disorder -Tobacco cessation counseling at bedside  Code Status: Full  Family Communication: wife at bedside  Disposition Plan: will stay another midnight to complete neuro work up and to continue  tyreatment   Consultants:  Neuro  IR  Procedures:  Cerebral angiogram  Antimicrobials:  None  DVT prophylaxis:  Lovenox 40 mg sq daily   Objective: Vitals:   10/04/17 1725 10/04/17 2107 10/05/17 0127 10/05/17 0542  BP: (!) 148/66 134/60 136/68 (!) 151/63  Pulse: (!) 51 (!) 51 (!) 55 (!) 57  Resp: 17 18 16 16   Temp: 98.1 F (36.7 C) 98.3 F (36.8 C) 98.5 F (36.9 C) 98.9 F (37.2 C)  TempSrc: Oral Oral Oral Oral  SpO2: 98% 98% 99% 97%  Weight:      Height:        Intake/Output Summary (Last 24 hours) at 10/05/2017 0739 Last data filed at 10/04/2017 1830 Gross per 24 hour  Intake 1100 ml  Output 550 ml  Net 550 ml   Filed Weights   10/03/17 2003  Weight: 80.7 kg (178 lb)    Exam:   General:  37 male Wd WN NAD A&O x3  Cardiovascular: RRR no rubs or gallops  Respiratory: CTA no wheezes or rhonchi  Abdomen: Soft NT ND NBS x4   Musculoskeletal: moves all 4 limbs. 2/4 pulses in all 4  Skin: No open lesions noted  Psychiatry: Mood appropriate for  condition and setting   Data Reviewed: CBC: Recent Labs  Lab 10/03/17 2017 10/03/17 2040 10/04/17 0426 10/05/17 0415  WBC 10.2  --  8.1 11.8*  NEUTROABS 6.3  --   --   --   HGB 14.3 14.6 13.1 14.0  HCT 41.3 43.0 39.5 40.9  MCV 96.7  --  97.5 96.5  PLT 170  --  147* 458*   Basic Metabolic Panel: Recent Labs  Lab 10/03/17  2017 10/03/17 2040 10/04/17 0426 10/05/17 0415  NA 138 143  --  139  K 3.3* 3.6  --  3.7  CL 105 104  --  108  CO2 25  --   --  26  GLUCOSE 87 88  --  92  BUN 23* 24*  --  17  CREATININE 0.94 0.80 0.85 0.85  CALCIUM 9.0  --   --  8.7*   GFR: Estimated Creatinine Clearance: 83.5 mL/min (by C-G formula based on SCr of 0.85 mg/dL). Liver Function Tests: Recent Labs  Lab 10/03/17 2017  AST 20  ALT 20  ALKPHOS 68  BILITOT 0.2*  PROT 6.5  ALBUMIN 3.6   No results for input(s): LIPASE, AMYLASE in the last 168 hours. Recent Labs  Lab 10/03/17 2322  AMMONIA 29    Coagulation Profile: Recent Labs  Lab 10/03/17 2017  INR 1.02   Cardiac Enzymes: No results for input(s): CKTOTAL, CKMB, CKMBINDEX, TROPONINI in the last 168 hours. BNP (last 3 results) No results for input(s): PROBNP in the last 8760 hours. HbA1C: Recent Labs    10/04/17 0426  HGBA1C 5.6   CBG: No results for input(s): GLUCAP in the last 168 hours. Lipid Profile: Recent Labs    10/04/17 0426  CHOL 143  HDL 35*  LDLCALC 89  TRIG 97  CHOLHDL 4.1   Thyroid Function Tests: No results for input(s): TSH, T4TOTAL, FREET4, T3FREE, THYROIDAB in the last 72 hours. Anemia Panel: No results for input(s): VITAMINB12, FOLATE, FERRITIN, TIBC, IRON, RETICCTPCT in the last 72 hours. Urine analysis:    Component Value Date/Time   COLORURINE STRAW (A) 10/04/2017 0132   APPEARANCEUR CLEAR 10/04/2017 0132   LABSPEC 1.013 10/04/2017 0132   PHURINE 7.0 10/04/2017 0132   GLUCOSEU NEGATIVE 10/04/2017 0132   HGBUR NEGATIVE 10/04/2017 0132   BILIRUBINUR NEGATIVE 10/04/2017 0132   KETONESUR NEGATIVE 10/04/2017 0132   PROTEINUR NEGATIVE 10/04/2017 0132   UROBILINOGEN 0.2 03/09/2014 1814   NITRITE NEGATIVE 10/04/2017 0132   LEUKOCYTESUR NEGATIVE 10/04/2017 0132   Sepsis Labs: @LABRCNTIP (procalcitonin:4,lacticidven:4)  )No results found for this or any previous visit (from the past 240 hour(s)).    Studies: Darrell Cervical Spine Wo Contrast  Result Date: 10/04/2017 CLINICAL DATA:  Difficulty walking for the last 2 weeks.  Headache. EXAM: MRI CERVICAL SPINE WITHOUT CONTRAST TECHNIQUE: Multiplanar, multisequence Darrell imaging of the cervical spine was performed. No intravenous contrast was administered. COMPARISON:  CTA head neck from earlier today FINDINGS: Alignment: Unremarkable Vertebrae: No fracture, evidence of discitis, or bone lesion. Cord: Normal signal and morphology. Posterior Fossa, vertebral arteries, paraspinal tissues: Absent left vertebral flow void, known from CTA earlier  today Disc levels: C2-3: Unremarkable. C3-4: Disc narrowing with small uncovertebral spurs. Moderate right and mild left foraminal narrowing. Patent spinal canal C4-5: Disc narrowing and bulging. Uncovertebral spurring greater on the left by CT. Left more than right foraminal impingement. C5-6: Disc narrowing and uncovertebral ridging greater on the right. Foraminal impingement greater on the right. Patent spinal canal. C6-7: Disc narrowing and bulging with small uncovertebral spurs. Patent canal and foramina. C7-T1:Disc bulging and uncovertebral spurring with mild to moderate bilateral foraminal narrowing. Patent spinal canal. IMPRESSION: 1. No canal stenosis or evidence of myelopathy. 2. Multilevel foraminal narrowing with quantification limited by motion. Electronically Signed   By: Monte Fantasia M.D.   On: 10/04/2017 20:53    Scheduled Meds: .  stroke: mapping our early stages of recovery book   Does not apply Once  .  amLODipine  5 mg Oral Daily  . aspirin  325 mg Oral Daily  . atorvastatin  40 mg Oral Daily  . carvedilol  3.125 mg Oral BID WC  . clopidogrel  75 mg Oral Daily  . enoxaparin (LOVENOX) injection  40 mg Subcutaneous Daily  . ferrous sulfate  325 mg Oral BID WC  . finasteride  5 mg Oral Daily  . isosorbide mononitrate  30 mg Oral Daily  . lidocaine  1 patch Transdermal Q24H  . pantoprazole  40 mg Oral Daily  . potassium chloride SA  20 mEq Oral BID  . terazosin  10 mg Oral QHS    Continuous Infusions:    LOS: 1 day     Kayleen Memos, MD [facility] Pager [phone] If 7PM-7AM, please contact night-coverage [email or web address] Password [email or web address] 10/05/2017, 7:39 AM

## 2017-10-05 NOTE — Evaluation (Signed)
Occupational Therapy Evaluation Patient Details Name: [name]. MRN: [id number] DOB: 1946-05-28 Today's Date: 10/05/2017    History of Present Illness [name]. is a 71 y.o. male with PMH of CAD, HTN, HLD who presents with with progressive gait disturbance, posterior headache/Neck pain x 3+ weeks. Patient describes intermittent episodes of ataxia, lasting 30 -90 minutes, occurring 3-4x/day. MRI negative for stroke. 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   Clinical Impression   This 71 y/o M presents with the above. At baseline Pt is independent with ADLs and functional mobility. Pt completed room level functional mobility, toilet transfer, and dressing ADLs at supervision level throughout. Pt reports feeling overall back to his baseline regarding ADLs and mobility. Education provided and questions answered throughout. No further acute OT needs identified at this time. Will sign off.     Follow Up Recommendations  No OT follow up;Supervision - Intermittent    Equipment Recommendations  None recommended by OT           Precautions / Restrictions Precautions Precautions: None Restrictions Weight Bearing Restrictions: No      Mobility Bed Mobility Overal bed mobility: Modified Independent             General bed mobility comments: sitting EOB with PT upon arrival   Transfers Overall transfer level: Modified independent Equipment used: None             General transfer comment: supervision for safety     Balance Overall balance assessment: No apparent balance deficits (not formally assessed)                                         ADL either performed or assessed with clinical judgement   ADL Overall ADL's : At baseline                                       General ADL Comments: Pt completed room level functional mobility, toilet transfer, ambulated within  room to obtain clothing from closet for UB/LB dressing - completing all with supervision throughout. Pt reports feeling a little weak due to being in bed while in hospital, but otherwise feels at his baseline.      Vision Baseline Vision/History: No visual deficits Patient Visual Report: No change from baseline Vision Assessment?: No apparent visual deficits                Pertinent Vitals/Pain Pain Assessment: No/denies pain     Hand Dominance Right   Extremity/Trunk Assessment Upper Extremity Assessment Upper Extremity Assessment: Overall WFL for tasks assessed   Lower Extremity Assessment Lower Extremity Assessment: Defer to PT evaluation   Cervical / Trunk Assessment Cervical / Trunk Assessment: Normal   Communication Communication Communication: No difficulties   Cognition Arousal/Alertness: Awake/alert Behavior During Therapy: Impulsive(appears to be somewhat his baseline activity/fast moving ) Overall Cognitive Status: Within Functional Limits for tasks assessed                                     General Comments  fiance in room with questions about testing directed to cardiovascular provider and nurse  Home Living Family/patient expects to be discharged to:: Private residence Living Arrangements: Spouse/significant other Available Help at Discharge: Family;Available 24 hours/day Type of Home: House Home Access: Ramped entrance     Home Layout: Two level Alternate Level Stairs-Number of Steps: 12(also has stair lift ) Alternate Level Stairs-Rails: Right Bathroom Shower/Tub: Walk-in shower;Tub/shower unit(mostly uses walk-in shower )   Bathroom Toilet: Standard Bathroom Accessibility: Yes   Home Equipment: Grab bars - toilet;Shower seat;Walker - 2 wheels;Cane - single point;Bedside commode;Hand held shower head          Prior Functioning/Environment Level of Independence: Independent        Comments: community,  ambulator, driver, independent with ADLs, iADLs        OT Problem List: Decreased activity tolerance            OT Goals(Current goals can be found in the care plan section) Acute Rehab OT Goals Patient Stated Goal: go home OT Goal Formulation: All assessment and education complete, DC therapy                                 AM-PAC PT "6 Clicks" Daily Activity     Outcome Measure Help from another person eating meals?: None Help from another person taking care of personal grooming?: None Help from another person toileting, which includes using toliet, bedpan, or urinal?: None Help from another person bathing (including washing, rinsing, drying)?: None Help from another person to put on and taking off regular upper body clothing?: None Help from another person to put on and taking off regular lower body clothing?: A Little 6 Click Score: 23   End of Session Equipment Utilized During Treatment: Gait belt Nurse Communication: Mobility status;Other (comment)(confirmed okay for Pt to be up in room)  Activity Tolerance: Patient tolerated treatment well Patient left: Other (comment);with family/visitor present(standing in room with fiance')  OT Visit Diagnosis: Other symptoms and signs involving the nervous system (J24.268)                Time: 3419-6222 OT Time Calculation (min): 15 min Charges:  OT General Charges $OT Visit: 1 Visit OT Evaluation $OT Eval Low Complexity: 1 Low G-Codes:     Lou Cal, OT Pager [phone] 10/05/2017   Raymondo Band 10/05/2017, 3:05 PM

## 2017-10-05 NOTE — Progress Notes (Signed)
RECEIVED PT FROM IR IN NO ACUTE DISTRESS. HOB FLAT, ADVISED PT TO REMAIN FLAT UNTIL INSTRUCTED OTHERWISE, URINAL OFFERED. CATH SITE R GROIN INTACT, NO OBVIOUS HEMATOMA. PEDAL PULSES +2 BIL. FOOD OFFERED, DENIES NEED TO VOID.

## 2017-10-06 MED ORDER — CLOPIDOGREL BISULFATE 75 MG PO TABS: 75.0000 mg | ORAL_TABLET | Freq: Every day | ORAL | 0 refills | Status: DC

## 2017-10-06 MED ORDER — ATORVASTATIN CALCIUM 40 MG PO TABS: 40.0000 mg | ORAL_TABLET | Freq: Every day | ORAL | 0 refills | Status: DC

## 2017-10-06 MED ORDER — ASPIRIN 325 MG PO TBEC: 325.0000 mg | DELAYED_RELEASE_TABLET | Freq: Every day | ORAL | 0 refills | Status: DC

## 2017-10-06 MED ORDER — NICOTINE 14 MG/24HR TD PT24: 14.0000 mg | MEDICATED_PATCH | TRANSDERMAL | 0 refills | Status: DC

## 2017-10-06 NOTE — Care Management Note (Signed)
Case Management Note  Patient Details  Name: [name]. MRN: [id number] Date of Birth: April 08, 1946  Subjective/Objective:                 Patient with order to DC to home today. Chart reviewed. No Home Health or Equipment needs, no unacknowledged Case Management consults or medication needs identified at the time of this note. Plan for DC to home.  CM signing off. If new needs arise today prior to discharge, please call Carles Collet RN CM at [phone].   Action/Plan:   Expected Discharge Date:  10/06/17               Expected Discharge Plan:  Home/Self Care  In-House Referral:     Discharge planning Services  CM Consult  Post Acute Care Choice:    Choice offered to:     DME Arranged:    DME Agency:     HH Arranged:    HH Agency:     Status of Service:  Completed, signed off  If discussed at [employer] of Stay Meetings, dates discussed:    Additional Comments:  Carles Collet, RN 10/06/2017, 8:27 AM

## 2017-10-06 NOTE — Progress Notes (Signed)
Patient ready for discharge to home; discharge instructions given and reviewed; Rx's given; patient discharged out via wheelchair; accompanied home by his wife.

## 2017-10-06 NOTE — Discharge Summary (Addendum)
Discharge Summary  [name]. INO:[id number] DOB: December 28, 1945  PCP: [facility]  Admit date: 10/03/2017 Discharge date: 10/06/2017  Time spent: 25 minutes  Recommendations for Outpatient Follow-up:  1. Follow up with your PCP within a week 2. Follow up with neurology within a week 3. Take your medications as prescribed 4. Stop smoking/nicotine patch prescribed  Discharge Diagnoses:  Active Hospital Problems   Diagnosis Date Noted  . Vertebro basilar insufficiency 10/04/2017  . Vertebral artery dissection ([place]) 10/04/2017  . Vertebral artery insufficiency 10/04/2017  . ICM, EF 40% Jan 2013,  50-55% Oct 2013,  40-45% with apical infarct 10/18/12 12/05/2011  . HTN (hypertension) 12/05/2011  . S/P CABG x 3 11/2011: LIMA TO LAD, SVG TO OM, SVG TO RCA., VG to OM occluded, jeopardized LCX, by cath 08/29/12 -- Re-Do CABG with SVG-OM (09/2012) 12/04/2011    Resolved Hospital Problems  No resolved problems to display.    Discharge Condition: Stable  Diet recommendation: Resume previous diet  Vitals:   10/06/17 0134 10/06/17 0503  BP: (!) 161/61 (!) 154/64  Pulse: (!) 50 (!) 57  Resp: 16 16  Temp: 98.6 F (37 C) 98.6 F (37 C)  SpO2: 97% 98%    History of present illness:  Mr Winterton is a 71 yo M with [place] significant for CAD s/p CABG, PCI with satent, HTN, HLD, who presented to the ED [facility] with complaints of unsteady gait of 2 weeks duration associated with headaches. likely chronic vertebral artery occlusion with sub-optimal compensation from ie: dehydration, Hypo/hyper tension, smoking.  Hospital course complicated by occipital headache which resolved after migraine cocktail. Echo done 10/05/17 revealed no sign of intra cardiac thrombus. Patient worked with PT on gait and balance. Rest of stay was benign.   On the day of discharge the patient was hemodynamically stable. Patient was counseled on the importance of tobacco cessation. Given  prescription for nicotine patch. Counseled on medication compliance. Given prescription for atorvastatin, asa, plavix. Advised to follow up with neurology and PCP within a week. Patient un\derstands and agrees to plan.   Hospital Course:  Principal Problem:   Vertebro basilar insufficiency Active Problems:   S/P CABG x 3 11/2011: LIMA TO LAD, SVG TO OM, SVG TO RCA., VG to OM occluded, jeopardized LCX, by cath 08/29/12 -- Re-Do CABG with SVG-OM (09/2012)   HTN (hypertension)   ICM, EF 40% Jan 2013,  50-55% Oct 2013,  40-45% with apical infarct 10/18/12   Vertebral artery dissection ([facility])   Vertebral artery insufficiency          Signed         Ataxia most likely 2/2 to chronic vertebral occlusion, resolved -Neurology following, continue follow up in the outpt setting -continue asa, clopidogrel, lipitor -MRI cervical spine -IR angio 10/05/17 -echo 11/30 no sign of intracardiac thrombus -PT/OT   Intractable headache, resolved -migraine cocktail  -IV toradol, IV benadryl, IV reglan, all once  HLD  -stable -LDL 89 -continue statin  HTN -stable -coreg, amlodipine, proscar, terazosin  Hypokalemia, resolved -replete as indicated -bmp am  BPH -proscar, terazosin  Chronic anemia -stable hg 13.1 -ferrous sulfate  GERD -stable -protonix 40 mg po daily  Tobacco use disorder -Tobacco cessation counseling at bedside -nicotine patch  Code Status: Full  Family Communication: wife at bedside  Disposition Plan: DC to home  Consultants:  Neuro  IR  Procedures:  Cerebral angiogram  Antimicrobials:  None     Objective:  Vitals:   10/04/17 1725 10/04/17 2107 10/05/17 0127 10/05/17 0542  BP: (!) 148/66 134/60 136/68 (!) 151/63  Pulse: (!) 51 (!) 51 (!) 55 (!) 57  Resp: 17 18 16 16   Temp: 98.1 F (36.7 C) 98.3 F (36.8 C) 98.5 F (36.9 C) 98.9 F (37.2 C)  TempSrc: Oral Oral Oral Oral  SpO2: 98% 98% 99% 97%  Weight:       Height:        Intake/Output Summary (Last 24 hours) at 10/05/2017 0739 Last data filed at 10/04/2017 1830    Gross per 24 hour  Intake 1100 ml  Output 550 ml  Net 550 ml      Filed Weights   10/03/17 2003  Weight: 80.7 kg (178 lb)     Exam:   General:  43 male Wd WN NAD A&O x3  Cardiovascular: RRR no rubs or gallops  Respiratory: CTA no wheezes or rhonchi  Abdomen: Soft NT ND NBS x4   Musculoskeletal: moves all 4 limbs. 2/4 pulses in all 4  Skin: No open lesions noted  Psychiatry: Mood appropriate for  condition and setting   Data Reviewed: CBC: LastLabs        Recent Labs  Lab 10/03/17 2017 10/03/17 2040 10/04/17 0426 10/05/17 0415  WBC 10.2  --  8.1 11.8*  NEUTROABS 6.3  --   --   --   HGB 14.3 14.6 13.1 14.0  HCT 41.3 43.0 39.5 40.9  MCV 96.7  --  97.5 96.5  PLT 170  --  147* 144*     Basic Metabolic Panel: LastLabs  Recent Labs  Lab 10/03/17 2017 10/03/17 2040 10/04/17 0426 10/05/17 0415  NA 138 143  --  139  K 3.3* 3.6  --  3.7  CL 105 104  --  108  CO2 25  --   --  26  GLUCOSE 87 88  --  92  BUN 23* 24*  --  17  CREATININE 0.94 0.80 0.85 0.85  CALCIUM 9.0  --   --  8.7*     GFR: Estimated Creatinine Clearance: 83.5 mL/min (by C-G formula based on SCr of 0.85 mg/dL). Liver Function Tests: LastLabs     Recent Labs  Lab 10/03/17 2017  AST 20  ALT 20  ALKPHOS 68  BILITOT 0.2*  PROT 6.5  ALBUMIN 3.6     LastLabs  No results for input(s): LIPASE, AMYLASE in the last 168 hours.   LastLabs     Recent Labs  Lab 10/03/17 2322  AMMONIA 29     Coagulation Profile: LastLabs     Recent Labs  Lab 10/03/17 2017  INR 1.02     Cardiac Enzymes: LastLabs  No results for input(s): CKTOTAL, CKMB, CKMBINDEX, TROPONINI in the last 168 hours.   BNP (last 3 results) RecentLabs(withinlast365days)  No results for input(s): PROBNP in the last 8760 hours.    HbA1C: RecentLabs(last2labs)     Recent Labs    10/04/17 0426  HGBA1C 5.6     CBG: LastLabs  No results for input(s): GLUCAP in the last 168 hours.   Lipid Profile: RecentLabs(last2labs)     Recent Labs    10/04/17 0426  CHOL 143  HDL 35*  LDLCALC 89  TRIG 97  CHOLHDL 4.1     Thyroid Function Tests: RecentLabs(last2labs)  No results for input(s): TSH, T4TOTAL, FREET4, T3FREE, THYROIDAB in the last 72 hours.   Anemia Panel: RecentLabs(last2labs)  No results for input(s): VITAMINB12, FOLATE,  FERRITIN, TIBC, IRON, RETICCTPCT in the last 72 hours.   Urine analysis: Labs(Brief)          Component Value Date/Time   COLORURINE STRAW (A) 10/04/2017 0132   APPEARANCEUR CLEAR 10/04/2017 0132   LABSPEC 1.013 10/04/2017 0132   PHURINE 7.0 10/04/2017 0132   GLUCOSEU NEGATIVE 10/04/2017 0132   HGBUR NEGATIVE 10/04/2017 0132   BILIRUBINUR NEGATIVE 10/04/2017 0132   KETONESUR NEGATIVE 10/04/2017 0132   PROTEINUR NEGATIVE 10/04/2017 0132   UROBILINOGEN 0.2 03/09/2014 1814   NITRITE NEGATIVE 10/04/2017 0132   LEUKOCYTESUR NEGATIVE 10/04/2017 0132     Sepsis Labs: @LABRCNTIP (procalcitonin:4,lacticidven:4)  )No results found for this or any previous visit (from the past 240 hour(s)).    Studies: Mr Cervical Spine Wo Contrast  Result Date: 10/04/2017 CLINICAL DATA:  Difficulty walking for the last 2 weeks.  Headache. EXAM: MRI CERVICAL SPINE WITHOUT CONTRAST TECHNIQUE: Multiplanar, multisequence MR imaging of the cervical spine was performed. No intravenous contrast was administered. COMPARISON:  CTA head neck from earlier today FINDINGS: Alignment: Unremarkable Vertebrae: No fracture, evidence of discitis, or bone lesion. Cord: Normal signal and morphology. Posterior Fossa, vertebral arteries, paraspinal tissues: Absent left vertebral flow void, known from CTA earlier today Disc levels: C2-3: Unremarkable. C3-4: Disc narrowing  with small uncovertebral spurs. Moderate right and mild left foraminal narrowing. Patent spinal canal C4-5: Disc narrowing and bulging. Uncovertebral spurring greater on the left by CT. Left more than right foraminal impingement. C5-6: Disc narrowing and uncovertebral ridging greater on the right. Foraminal impingement greater on the right. Patent spinal canal. C6-7: Disc narrowing and bulging with small uncovertebral spurs. Patent canal and foramina. C7-T1:Disc bulging and uncovertebral spurring with mild to moderate bilateral foraminal narrowing. Patent spinal canal. IMPRESSION: 1. No canal stenosis or evidence of myelopathy. 2. Multilevel foraminal narrowing with quantification limited by motion. Electronically Signed   By: Monte Fantasia M.D.   On: 10/04/2017 20:53    Scheduled Meds: .  stroke: mapping our early stages of recovery book   Does not apply Once  . amLODipine  5 mg Oral Daily  . aspirin  325 mg Oral Daily  . atorvastatin  40 mg Oral Daily  . carvedilol  3.125 mg Oral BID WC  . clopidogrel  75 mg Oral Daily  . enoxaparin (LOVENOX) injection  40 mg Subcutaneous Daily  . ferrous sulfate  325 mg Oral BID WC  . finasteride  5 mg Oral Daily  . isosorbide mononitrate  30 mg Oral Daily  . lidocaine  1 patch Transdermal Q24H  . pantoprazole  40 mg Oral Daily  . potassium chloride SA  20 mEq Oral BID  . terazosin  10 mg Oral QHS  s            Discharge Exam: BP (!) 154/64 (BP Location: Left Arm)   Pulse (!) 57   Temp 98.6 F (37 C) (Oral)   Resp 16   Ht 5\' 10"  (1.778 m)   Wt 80.7 kg (178 lb)   SpO2 98%   BMI 25.54 kg/m    Discharge Instructions You were cared for by a hospitalist during your hospital stay. If you have any questions about your discharge medications or the care you received while you were in the hospital after you are discharged, you can call the unit and asked to speak with the hospitalist on call if the hospitalist that took care of you is not  available. Once you are discharged, your primary care physician will handle  any further medical issues. Please note that NO REFILLS for any discharge medications will be authorized once you are discharged, as it is imperative that you return to your primary care physician (or establish a relationship with a primary care physician if you do not have one) for your aftercare needs so that they can reassess your need for medications and monitor your lab values.  Discharge Instructions    Ambulatory referral to Neurology   Complete by:  As directed    An appointment is requested in approximately: 6 weeks Follow up with stroke clinic (Dr Leonie Man preferred, if not available, then consider Caesar Chestnut, [facility] or Jaynee Eagles whoever is available) at [facility] in about 6-8 weeks. Thanks.     Allergies as of 10/06/2017   No Known Allergies     Medication List    STOP taking these medications   oxyCODONE-acetaminophen 10-325 MG tablet Commonly known as:  PERCOCET   terazosin 10 MG capsule Commonly known as:  HYTRIN     TAKE these medications   amLODipine 5 MG tablet Commonly known as:  NORVASC Take 1 tablet (5 mg total) by mouth daily.   aspirin 325 MG EC tablet Take 1 tablet (325 mg total) by mouth daily.   atorvastatin 40 MG tablet Commonly known as:  LIPITOR Take 1 tablet (40 mg total) by mouth daily. What changed:  how much to take   carvedilol 3.125 MG tablet Commonly known as:  COREG Take 3.125 mg by mouth 2 (two) times daily with a meal.   clopidogrel 75 MG tablet Commonly known as:  PLAVIX Take 1 tablet (75 mg total) by mouth daily.   doxycycline 100 MG tablet Commonly known as:  VIBRA-TABS Take 100 mg by mouth 2 (two) times daily.   ferrous sulfate 325 (65 FE) MG tablet Take 1 tablet (325 mg total) by mouth 3 (three) times daily with meals. What changed:  when to take this   finasteride 5 MG tablet Commonly known as:  PROSCAR Take 5 mg by mouth daily.   hydrochlorothiazide 25  MG tablet Commonly known as:  HYDRODIURIL Take 25 mg by mouth daily.   ibuprofen 400 MG tablet Commonly known as:  ADVIL,MOTRIN Take 400 mg by mouth every 6 (six) hours as needed for mild pain.   isosorbide mononitrate 30 MG 24 hr tablet Commonly known as:  IMDUR Take 1 tablet (30 mg total) by mouth daily.   lidocaine 5 % Commonly known as:  LIDODERM Place 1 patch onto the skin daily. Remove & Discard patch within 12 hours or as directed by MD   nicotine 14 mg/24hr patch Commonly known as:  NICODERM CQ - dosed in mg/24 hours Place 1 patch (14 mg total) onto the skin daily.   nitroGLYCERIN 0.4 MG SL tablet Commonly known as:  NITROSTAT Place 1 tablet (0.4 mg total) under the tongue every 5 (five) minutes x 3 doses as needed for chest pain.   omeprazole 20 MG capsule Commonly known as:  PRILOSEC Take 20 mg by mouth 2 (two) times daily.   potassium chloride SA 20 MEQ tablet Commonly known as:  K-DUR,KLOR-CON Take 20 mEq by mouth 2 (two) times daily.   tamsulosin 0.4 MG Caps capsule Commonly known as:  FLOMAX Take 1 capsule (0.4 mg total) by mouth daily.   traMADol 50 MG tablet Commonly known as:  ULTRAM Take 50-100 mg by mouth 3 (three) times daily.      No Known Allergies Follow-up Information  Garvin Fila, MD. Schedule an appointment as soon as possible for a visit in 6 week(s).   Specialties:  Neurology, Radiology Contact information: [address] [place] [address] [phone]        Luanne Bras, MD. Schedule an appointment as soon as possible for a visit in 6 week(s).   Specialties:  Interventional Radiology, Radiology Contact information: [address] [place] [address] [phone]            The results of significant diagnostics from this hospitalization (including imaging, microbiology, ancillary and laboratory) are listed below for reference.    Significant Diagnostic Studies: Ct Angio Head W  Or Wo Contrast  Result Date: 10/04/2017 CLINICAL DATA:  Ataxia, staggering gait for 2 weeks. Headache for 3-4 days. Intermittent confusion. History of hypertension, hypercholesterolemia. EXAM: CT ANGIOGRAPHY HEAD AND NECK TECHNIQUE: Multidetector CT imaging of the head and neck was performed using the standard protocol during bolus administration of intravenous contrast. Multiplanar CT image reconstructions and MIPs were obtained to evaluate the vascular anatomy. Carotid stenosis measurements (when applicable) are obtained utilizing NASCET criteria, using the distal internal carotid diameter as the denominator. CONTRAST:  6mL ISOVUE-370 IOPAMIDOL (ISOVUE-370) INJECTION 76% COMPARISON:  MRI of the head October 04, 2017 at 0158 hours and CT HEAD October 03, 2017. FINDINGS: CTA NECK- Mild motion degraded examination. AORTIC ARCH: Normal appearance of the thoracic arch, normal branch pattern. Moderate calcific atherosclerosis. The origins of the innominate, left Common carotid artery and subclavian artery are widely patent. RIGHT CAROTID SYSTEM: Common carotid artery is widely patent, coursing in a straight line fashion. Shelf-like intimal thickening calcific atherosclerosis with 3 mm outpouching and luminal irregularity proximal internal carotid artery. No hemodynamically significant stenosis by NASCET criteria. LEFT CAROTID SYSTEM: Common carotid artery is widely patent, coursing in a straight line fashion. Moderate eccentric calcific atherosclerosis carotid bifurcation without hemodynamically significant stenosis by NASCET criteria. Normal appearance of the internal carotid artery. VERTEBRAL ARTERIES:Mild stenosis RIGHT vertebral artery origin. Codominant vertebral artery's. There is gradual loss of contrast opacification LEFT P2 segment. SKELETON: No acute osseous process though bone windows have not been submitted. Degenerative change of the cervical spine resulting in severe LEFT C4-5, moderate to severe  RIGHT C5-6 and C6-7 neural foraminal narrowing. OTHER NECK: Soft tissues of the neck are nonacute though, not tailored for evaluation. UPPER CHEST: Included lung apices are clear. No superior mediastinal lymphadenopathy. Status post median sternotomy for CABG. CTA HEAD ANTERIOR CIRCULATION: Patent cervical internal carotid arteries, petrous, cavernous and supra clinoid internal carotid arteries. Patent anterior communicating artery. Patent anterior and middle cerebral arteries, mild luminal irregularity compatible with atherosclerosis. No large vessel occlusion, significant stenosis, contrast extravasation or aneurysm. POSTERIOR CIRCULATION: Complete loss of contrast opacification LEFT vertebral artery, with coarse calcifications mid LEFT V4 segment. Mild luminal irregularity RIGHT vertebral artery. Patent RIGHT vertebral artery, vertebrobasilar junction and basilar artery, as well as main branch vessels. Patent posterior cerebral arteries, mild luminal irregularity compatible with atherosclerosis. Robust RIGHT posterior communicating artery present. No large vessel occlusion, significant stenosis, contrast extravasation or aneurysm. VENOUS SINUSES: Major dural venous sinuses are patent though not tailored for evaluation on this angiographic examination. ANATOMIC VARIANTS: Hypoplastic RIGHT A1 segment. DELAYED PHASE: No abnormal intracranial enhancement. Symmetric vertebral artery enhancement. MIP images reviewed. IMPRESSION: CTA NECK: 1. Gradual loss of LEFT vertebral artery contrast opacification most compatible with dissection. No significant reconstitution in the neck. 2. Atherosclerosis without hemodynamically significant stenosis. 3. Abnormal morphology proximal RIGHT internal carotid  artery most compatible with old dissection, superimposed atherosclerosis . 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 CTA HEAD: 1. Loss of LEFT vertebral artery contrast opacification favoring slow flow given CT HEAD and recent MRI  appearance. 2. No emergent large vessel occlusion or severe stenosis. Mild intracranial atherosclerosis. Acute findings discussed with and reconfirmed by Dr.MCNEILL [facility] on 10/04/2017 at 3:32 am. Aortic Atherosclerosis (ICD10-I70.0). Electronically Signed   By: Elon Alas M.D.   On: 10/04/2017 03:30   Ct Head Wo Contrast  Result Date: 10/03/2017 CLINICAL DATA:  Headache EXAM: CT HEAD WITHOUT CONTRAST TECHNIQUE: Contiguous axial images were obtained from the base of the skull through the vertex without intravenous contrast. COMPARISON:  None. FINDINGS: Brain: No acute territorial infarction, hemorrhage or intracranial mass is visualized. Mild atrophy. Ventricle size within normal limits Vascular: No hyperdense vessels. Carotid vessel calcification. Calcifications in the left vertebral artery Skull: Normal. Negative for fracture or focal lesion. Sinuses/Orbits: Mucosal thickening in the maxillary and ethmoid sinuses. No acute orbital abnormality. Other: None IMPRESSION: No CT evidence for acute intracranial abnormality. Electronically Signed   By: Donavan Foil M.D.   On: 10/03/2017 20:54   Ct Angio Neck W Or Wo Contrast  Result Date: 10/04/2017 CLINICAL DATA:  Ataxia, staggering gait for 2 weeks. Headache for 3-4 days. Intermittent confusion. History of hypertension, hypercholesterolemia. EXAM: CT ANGIOGRAPHY HEAD AND NECK TECHNIQUE: Multidetector CT imaging of the head and neck was performed using the standard protocol during bolus administration of intravenous contrast. Multiplanar CT image reconstructions and MIPs were obtained to evaluate the vascular anatomy. Carotid stenosis measurements (when applicable) are obtained utilizing NASCET criteria, using the distal internal carotid diameter as the denominator. CONTRAST:  71mL ISOVUE-370 IOPAMIDOL (ISOVUE-370) INJECTION 76% COMPARISON:  MRI of the head October 04, 2017 at 0158 hours and CT HEAD October 03, 2017. FINDINGS: CTA NECK- Mild  motion degraded examination. AORTIC ARCH: Normal appearance of the thoracic arch, normal branch pattern. Moderate calcific atherosclerosis. The origins of the innominate, left Common carotid artery and subclavian artery are widely patent. RIGHT CAROTID SYSTEM: Common carotid artery is widely patent, coursing in a straight line fashion. Shelf-like intimal thickening calcific atherosclerosis with 3 mm outpouching and luminal irregularity proximal internal carotid artery. No hemodynamically significant stenosis by NASCET criteria. LEFT CAROTID SYSTEM: Common carotid artery is widely patent, coursing in a straight line fashion. Moderate eccentric calcific atherosclerosis carotid bifurcation without hemodynamically significant stenosis by NASCET criteria. Normal appearance of the internal carotid artery. VERTEBRAL ARTERIES:Mild stenosis RIGHT vertebral artery origin. Codominant vertebral artery's. There is gradual loss of contrast opacification LEFT P2 segment. SKELETON: No acute osseous process though bone windows have not been submitted. Degenerative change of the cervical spine resulting in severe LEFT C4-5, moderate to severe RIGHT C5-6 and C6-7 neural foraminal narrowing. OTHER NECK: Soft tissues of the neck are nonacute though, not tailored for evaluation. UPPER CHEST: Included lung apices are clear. No superior mediastinal lymphadenopathy. Status post median sternotomy for CABG. CTA HEAD ANTERIOR CIRCULATION: Patent cervical internal carotid arteries, petrous, cavernous and supra clinoid internal carotid arteries. Patent anterior communicating artery. Patent anterior and middle cerebral arteries, mild luminal irregularity compatible with atherosclerosis. No large vessel occlusion, significant stenosis, contrast extravasation or aneurysm. POSTERIOR CIRCULATION: Complete loss of contrast opacification LEFT vertebral artery, with coarse calcifications mid LEFT V4 segment. Mild luminal irregularity RIGHT vertebral  artery. Patent RIGHT vertebral artery, vertebrobasilar junction and basilar artery, as well as main branch vessels. Patent posterior cerebral arteries, mild luminal irregularity compatible with atherosclerosis. Robust  RIGHT posterior communicating artery present. No large vessel occlusion, significant stenosis, contrast extravasation or aneurysm. VENOUS SINUSES: Major dural venous sinuses are patent though not tailored for evaluation on this angiographic examination. ANATOMIC VARIANTS: Hypoplastic RIGHT A1 segment. DELAYED PHASE: No abnormal intracranial enhancement. Symmetric vertebral artery enhancement. MIP images reviewed. IMPRESSION: CTA NECK: 1. Gradual loss of LEFT vertebral artery contrast opacification most compatible with dissection. No significant reconstitution in the neck. 2. Atherosclerosis without hemodynamically significant stenosis. 3. 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 CTA HEAD: 1. Loss of LEFT vertebral artery contrast opacification favoring slow flow given CT HEAD and recent MRI appearance. 2. No emergent large vessel occlusion or severe stenosis. Mild intracranial atherosclerosis. Acute findings discussed with and reconfirmed by Dr.MCNEILL [facility] on 10/04/2017 at 3:32 am. Aortic Atherosclerosis (ICD10-I70.0). Electronically Signed   By: Elon Alas M.D.   On: 10/04/2017 03:30   Mr Brain Wo Contrast  Result Date: 10/04/2017 CLINICAL DATA:  Gait disturbance, confusion and blurry vision for 5 days. Three day LEFT-sided headache. History of hypertension, hypercholesterolemia. EXAM: MRI HEAD WITHOUT CONTRAST MRV HEAD WITHOUT CONTRAST TECHNIQUE: Multiplanar, multiecho pulse sequences of the brain and surrounding structures were obtained without intravenous contrast. Angiographic images of the intracranial venous structures were obtained using MRV technique without  intravenous contrast. COMPARISON:  CT HEAD October 03, 2017 FINDINGS: MRI HEAD: BRAIN: No reduced diffusion to suggest acute ischemia or hyperacute demyelination. No susceptibility artifact to suggest hemorrhage. The ventricles and sulci are normal for patient's age. No suspicious parenchymal signal, mass or mass effect. No abnormal extra-axial fluid collections. VASCULAR: Normal major intracranial vascular flow voids present at skull base. SKULL AND UPPER CERVICAL SPINE: No abnormal sellar expansion. No suspicious calvarial bone marrow signal. Craniocervical junction maintained. SINUSES/ORBITS: Mild paranasal sinus mucosal thickening without air-fluid levels. Mastoid air cells are well aerated. The included ocular globes and orbital contents are non-suspicious. Status post bilateral ocular lens implants. OTHER: Patient is edentulous. MRV HEAD: Normal flow related enhancement within the superior sagittal sinus, torcula of the Herophili, bilateral transverse, sigmoid sinuses and included internal jugular veins. Normal flow related enhancement of the internal cerebral veins. IMPRESSION: 1. Normal noncontrast MRI of the head for age. 2. Normal noncontrast MRV head. Electronically Signed   By: Elon Alas M.D.   On: 10/04/2017 02:41   Mr Cervical Spine Wo Contrast  Result Date: 10/04/2017 CLINICAL DATA:  Difficulty walking for the last 2 weeks.  Headache. EXAM: MRI CERVICAL SPINE WITHOUT CONTRAST TECHNIQUE: Multiplanar, multisequence MR imaging of the cervical spine was performed. No intravenous contrast was administered. COMPARISON:  CTA head neck from earlier today FINDINGS: Alignment: Unremarkable Vertebrae: No fracture, evidence of discitis, or bone lesion. Cord: Normal signal and morphology. Posterior Fossa, vertebral arteries, paraspinal tissues: Absent left vertebral flow void, known from CTA earlier today Disc levels: C2-3: Unremarkable. C3-4: Disc narrowing with small uncovertebral spurs. Moderate  right and mild left foraminal narrowing. Patent spinal canal C4-5: Disc narrowing and bulging. Uncovertebral spurring greater on the left by CT. Left more than right foraminal impingement. C5-6: Disc narrowing and uncovertebral ridging greater on the right. Foraminal impingement greater on the right. Patent spinal canal. C6-7: Disc narrowing and bulging with small uncovertebral spurs. Patent canal and foramina. C7-T1:Disc bulging and uncovertebral spurring with mild to moderate bilateral foraminal narrowing. Patent spinal canal. IMPRESSION: 1. No canal stenosis or evidence of myelopathy. 2. Multilevel foraminal narrowing with quantification limited by  motion. Electronically Signed   By: Monte Fantasia M.D.   On: 10/04/2017 20:53   Mr Mrv Head Wo Cm  Result Date: 10/04/2017 CLINICAL DATA:  Gait disturbance, confusion and blurry vision for 5 days. Three day LEFT-sided headache. History of hypertension, hypercholesterolemia. EXAM: MRI HEAD WITHOUT CONTRAST MRV HEAD WITHOUT CONTRAST TECHNIQUE: Multiplanar, multiecho pulse sequences of the brain and surrounding structures were obtained without intravenous contrast. Angiographic images of the intracranial venous structures were obtained using MRV technique without intravenous contrast. COMPARISON:  CT HEAD October 03, 2017 FINDINGS: MRI HEAD: BRAIN: No reduced diffusion to suggest acute ischemia or hyperacute demyelination. No susceptibility artifact to suggest hemorrhage. The ventricles and sulci are normal for patient's age. No suspicious parenchymal signal, mass or mass effect. No abnormal extra-axial fluid collections. VASCULAR: Normal major intracranial vascular flow voids present at skull base. SKULL AND UPPER CERVICAL SPINE: No abnormal sellar expansion. No suspicious calvarial bone marrow signal. Craniocervical junction maintained. SINUSES/ORBITS: Mild paranasal sinus mucosal thickening without air-fluid levels. Mastoid air cells are well aerated. The  included ocular globes and orbital contents are non-suspicious. Status post bilateral ocular lens implants. OTHER: Patient is edentulous. MRV HEAD: Normal flow related enhancement within the superior sagittal sinus, torcula of the Herophili, bilateral transverse, sigmoid sinuses and included internal jugular veins. Normal flow related enhancement of the internal cerebral veins. IMPRESSION: 1. Normal noncontrast MRI of the head for age. 2. Normal noncontrast MRV head. Electronically Signed   By: Elon Alas M.D.   On: 10/04/2017 02:41    Microbiology: No results found for this or any previous visit (from the past 240 hour(s)).   Labs: Basic Metabolic Panel: Recent Labs  Lab 10/03/17 2017 10/03/17 2040 10/04/17 0426 10/05/17 0415  NA 138 143  --  139  K 3.3* 3.6  --  3.7  CL 105 104  --  108  CO2 25  --   --  26  GLUCOSE 87 88  --  92  BUN 23* 24*  --  17  CREATININE 0.94 0.80 0.85 0.85  CALCIUM 9.0  --   --  8.7*   Liver Function Tests: Recent Labs  Lab 10/03/17 2017  AST 20  ALT 20  ALKPHOS 68  BILITOT 0.2*  PROT 6.5  ALBUMIN 3.6   No results for input(s): LIPASE, AMYLASE in the last 168 hours. Recent Labs  Lab 10/03/17 2322  AMMONIA 29   CBC: Recent Labs  Lab 10/03/17 2017 10/03/17 2040 10/04/17 0426 10/05/17 0415  WBC 10.2  --  8.1 11.8*  NEUTROABS 6.3  --   --   --   HGB 14.3 14.6 13.1 14.0  HCT 41.3 43.0 39.5 40.9  MCV 96.7  --  97.5 96.5  PLT 170  --  147* 144*   Cardiac Enzymes: No results for input(s): CKTOTAL, CKMB, CKMBINDEX, TROPONINI in the last 168 hours. BNP: BNP (last 3 results) No results for input(s): BNP in the last 8760 hours.  ProBNP (last 3 results) No results for input(s): PROBNP in the last 8760 hours.  CBG: No results for input(s): GLUCAP in the last 168 hours.     Signed:  Kayleen Memos, MD [facility] 10/06/2017, 8:55 AM

## 2017-10-08 ENCOUNTER — Encounter (HOSPITAL_COMMUNITY): Payer: Self-pay | Admitting: Interventional Radiology

## 2017-10-08 ENCOUNTER — Other Ambulatory Visit (HOSPITAL_COMMUNITY): Payer: Self-pay | Admitting: Interventional Radiology

## 2017-10-08 DIAGNOSIS — I639 Cerebral infarction, unspecified: Secondary | ICD-10-CM

## 2017-10-09 ENCOUNTER — Encounter (HOSPITAL_COMMUNITY): Payer: Medicare Other | Attending: Cardiovascular Disease

## 2017-10-09 DIAGNOSIS — I2510 Atherosclerotic heart disease of native coronary artery without angina pectoris: Secondary | ICD-10-CM | POA: Insufficient documentation

## 2017-10-10 ENCOUNTER — Encounter: Payer: Self-pay | Admitting: Cardiovascular Disease

## 2017-10-10 ENCOUNTER — Ambulatory Visit (INDEPENDENT_AMBULATORY_CARE_PROVIDER_SITE_OTHER): Payer: Medicare Other | Admitting: Cardiovascular Disease

## 2017-10-10 VITALS — BP 148/64 | HR 48 | Ht 70.0 in | Wt 182.0 lb

## 2017-10-10 DIAGNOSIS — Z951 Presence of aortocoronary bypass graft: Secondary | ICD-10-CM | POA: Diagnosis not present

## 2017-10-10 DIAGNOSIS — Z87891 Personal history of nicotine dependence: Secondary | ICD-10-CM | POA: Diagnosis not present

## 2017-10-10 DIAGNOSIS — I63212 Cerebral infarction due to unspecified occlusion or stenosis of left vertebral arteries: Secondary | ICD-10-CM | POA: Diagnosis not present

## 2017-10-10 DIAGNOSIS — I10 Essential (primary) hypertension: Secondary | ICD-10-CM

## 2017-10-10 DIAGNOSIS — E785 Hyperlipidemia, unspecified: Secondary | ICD-10-CM | POA: Diagnosis not present

## 2017-10-10 MED ORDER — ATORVASTATIN CALCIUM 80 MG PO TABS: 80.0000 mg | ORAL_TABLET | Freq: Every day | ORAL | 6 refills | Status: AC

## 2017-10-10 NOTE — Assessment & Plan Note (Signed)
History of CAD status post bypass surgery by Dr. [name] January 2013. I catheterized him 1/213 revealing left main three-vessel disease with moderate LV dysfunction. He had a LIMA to his LAD, vein to obtuse marginal branch and RCA. He was catheterized again by Dr. Claiborne Billings October 2015 revealing an occluded vein graft to the circumflex with total left main. His LIMA was patent to the LAD as well as his RCA vein graft. He ultimately underwent redo coronary artery bypass grafting 1 to the circumflex obtuse marginal branch in November 2015 with marked improvement. Because of recurrent chest pain I recatheterized him revealing an insertion/and pre-anastomotic lesion which ultimately stented the circumflex complex obtuse marginal branch vein graft 10/31/12 and again recatheterized him 05/16/13 revealing high-grade "in-stent restenosis within the circumflex obtuse marginal branch distal stented segment which I re-intervened on as well as the aorto ostium. Since that time he has remained cardiac stable. He did stop smoking but restarted after his wife passed away up until recently.

## 2017-10-10 NOTE — Assessment & Plan Note (Signed)
History of essential hypertension blood pressure measured today at 148/64. He is on amlodipine, hydrochlorothiazide and carvedilol. Continue current meds are current dose.

## 2017-10-10 NOTE — Progress Notes (Signed)
10/10/2017 [name].   October 05, 1946  [id number]  [place], [facility] Cardiologist: [name] FACP, [place], [place], [place]  HPI:  [name]. is a 71 y.o.  mildly overweight married Caucasian male, father of 51 and grandfather to 7 grandchildren, whom I last saw in the office 08/25/15 He has a history of CAD, status post coronary artery bypass grafting by [name] in January of last year after a catheterization performed by me December 02, 2011, revealed left main 3-vessel disease with moderate LV dysfunction. He had a LIMA to his LAD, a vein to an OM branch and to the RCA. His postoperative course was uncomplicated. He did stop smoking at that time. His other problems include hypertension and hyperlipidemia as well as a family history of heart disease. He had recurrent episodes of unstable angina and ultimately underwent recatheterization by [name] in October of last year revealing occluded vein to the circumflex marginal branch with a total left main. He had a patent LIMA to his LAD and a patent vein to the RCA. It was presumed that his angina and non-STEMIs were related to ischemia in the circumflex territory. He ultimately underwent redo bypass surgery x1 to the circumflex obtuse marginal branch in November and returned several times after that with recurrent angina. Catheterization revealed a kink in the vessel just proximal to the distal anastomosis as well as preanastomotic lesion. I ultimately performed stenting of his newly-placed circumflex marginal vein graft on October 31, 2012, which has resulted in complete resolution of his anginal symptoms. He participated in cardiac rehab. Recently he developed recurrent chest pain. A Myoview stress test performed on 05/13/13 was read as "high risk" is remarkable for significant anterolateral and inferolateral ischemia.  I performed cardiac catheterization on him 05/16/13 revealing high-grade  in-stent restenosis" within the circumflex obtuse marginal branch SVG distal stented segment as well as at the aorto/ostium. I we stented the distal portion of the vein graft and stent at the origin as well as drug-eluting stents.he has remained asymptomatic since I saw him in the office one year ago. Unfortunately his wife a 40 years passed away in 2015/01/21 when I last still grieving. He is accompanied by his new fiancs Salvatore Marvel today.   Since I saw him last he has remained stable from a cardiovascular point of view. He was recently admitted to the hospital 09/25/17 with a vertebrobasilar stroke. He apparently had a cerebral angiogram performed by Dr. Patrecia Pour 10/03/17 revealing occluded vertebral artery on the left at the C1 segment. Apparently there is plan for intervention in the near future.   Current Meds  Medication Sig  . amLODipine (NORVASC) 5 MG tablet Take 1 tablet (5 mg total) by mouth daily.  Marland Kitchen aspirin 325 MG EC tablet Take 1 tablet (325 mg total) by mouth daily.  Marland Kitchen atorvastatin (LIPITOR) 80 MG tablet Take 1 tablet (80 mg total) by mouth daily.  . carvedilol (COREG) 3.125 MG tablet Take 3.125 mg by mouth 2 (two) times daily with a meal.  . clopidogrel (PLAVIX) 75 MG tablet Take 1 tablet (75 mg total) by mouth daily.  Marland Kitchen doxycycline (VIBRA-TABS) 100 MG tablet Take 100 mg by mouth 2 (two) times daily.  . ferrous sulfate 325 (65 FE) MG tablet Take 1 tablet (325 mg total) by mouth 3 (three) times daily with meals. (Patient taking differently: Take 325 mg by mouth 2 (two) times daily with  a meal. )  . finasteride (PROSCAR) 5 MG tablet Take 5 mg by mouth daily.  . hydrochlorothiazide (HYDRODIURIL) 25 MG tablet Take 25 mg by mouth daily.  Marland Kitchen ibuprofen (ADVIL,MOTRIN) 400 MG tablet Take 400 mg by mouth every 6 (six) hours as needed for mild pain.  . isosorbide mononitrate (IMDUR) 30 MG 24 hr tablet Take 1 tablet (30 mg total) by mouth daily.  Marland Kitchen lidocaine (LIDODERM) 5 % Place 1 patch  onto the skin daily. Remove & Discard patch within 12 hours or as directed by MD  . nitroGLYCERIN (NITROSTAT) 0.4 MG SL tablet Place 1 tablet (0.4 mg total) under the tongue every 5 (five) minutes x 3 doses as needed for chest pain.  Marland Kitchen omeprazole (PRILOSEC) 20 MG capsule Take 20 mg by mouth 2 (two) times daily.  . potassium chloride SA (K-DUR,KLOR-CON) 20 MEQ tablet Take 20 mEq by mouth 2 (two) times daily.  Marland Kitchen terazosin (HYTRIN) 10 MG capsule Take 10 mg by mouth at bedtime.  . traMADol (ULTRAM) 50 MG tablet Take 50-100 mg by mouth 3 (three) times daily.  . [DISCONTINUED] atorvastatin (LIPITOR) 40 MG tablet Take 1 tablet (40 mg total) by mouth daily.     No Known Allergies  Social History   Socioeconomic History  . Marital status: Married    Spouse name: Not on file  . Number of children: Not on file  . Years of education: Not on file  . Highest education level: Not on file  Social Needs  . Financial resource strain: Not on file  . Food insecurity - worry: Not on file  . Food insecurity - inability: Not on file  . Transportation needs - medical: Not on file  . Transportation needs - non-medical: Not on file  Occupational History  . Not on file  Tobacco Use  . Smoking status: Former Smoker    Packs/day: 0.50    Years: 50.00    Pack years: 25.00    Types: Cigarettes    Last attempt to quit: 12/02/2011    Years since quitting: 5.8  . Smokeless tobacco: Never Used  Substance and Sexual Activity  . Alcohol use: No    Comment: 08/29/2012 "haven't had a drink in > 25 years; never had problem w/it"  . Drug use: No  . Sexual activity: Yes  Other Topics Concern  . Not on file  Social History Narrative   [place] [phone]. History of Agent Orange exposure, followed at [facility]: General: negative for chills, fever, night sweats or weight changes.  Cardiovascular: negative for chest pain, dyspnea on exertion, edema, orthopnea, palpitations, paroxysmal nocturnal  dyspnea or shortness of breath Dermatological: negative for rash Respiratory: negative for cough or wheezing Urologic: negative for hematuria Abdominal: negative for nausea, vomiting, diarrhea, bright red blood per rectum, melena, or hematemesis Neurologic: negative for visual changes, syncope, or dizziness All other systems reviewed and are otherwise negative except as noted above.    Blood pressure (!) 148/64, pulse (!) 48, height 5\' 10"  (1.778 m), weight 182 lb (82.6 kg).  General appearance: alert and no distress Neck: no adenopathy, no carotid bruit, no JVD, supple, symmetrical, trachea midline and thyroid not enlarged, symmetric, no tenderness/mass/nodules Lungs: clear to auscultation bilaterally Heart: regular rate and rhythm, S1, S2 normal, no murmur, click, rub or gallop Extremities: extremities normal, atraumatic, no cyanosis or edema Pulses: 2+ and symmetric Skin: Skin color, texture, turgor normal. No rashes or lesions Neurologic: Alert  and oriented X 3, normal strength and tone. Normal symmetric reflexes. Normal coordination and gait  EKG not performed today  ASSESSMENT AND PLAN:   S/P CABG x 3 11/2011: LIMA TO LAD, SVG TO OM, SVG TO RCA., VG to OM occluded, jeopardized LCX, by cath 08/29/12 -- Re-Do CABG with SVG-OM (09/2012) History of CAD status post bypass surgery by Dr. [name] January 2013. I catheterized him 1/213 revealing left main three-vessel disease with moderate LV dysfunction. He had a LIMA to his LAD, vein to obtuse marginal branch and RCA. He was catheterized again by Dr. Claiborne Billings October 2015 revealing an occluded vein graft to the circumflex with total left main. His LIMA was patent to the LAD as well as his RCA vein graft. He ultimately underwent redo coronary artery bypass grafting 1 to the circumflex obtuse marginal branch in November 2015 with marked improvement. Because of recurrent chest pain I recatheterized him revealing an insertion/and pre-anastomotic  lesion which ultimately stented the circumflex complex obtuse marginal branch vein graft 10/31/12 and again recatheterized him 05/16/13 revealing high-grade "in-stent restenosis within the circumflex obtuse marginal branch distal stented segment which I re-intervened on as well as the aorto ostium. Since that time he has remained cardiac stable. He did stop smoking but restarted after his wife passed away up until recently.  Former smoker Recently discontinued tobacco abuse  HTN (hypertension) History of essential hypertension blood pressure measured today at 148/64. He is on amlodipine, hydrochlorothiazide and carvedilol. Continue current meds are current dose.  Dyslipidemia History of dyslipidemia on atorvastatin 40 mg with recent lipid lipid profile performed during a recent hospitalization 10/04/17 revealing total cholesterol of 43, LDL 89 and HDL 35. Based on this, I'm going to increase his atorvastatin to 80 mg a day and will recheck.      [name] FACP,FACC,FAHA, [facility] 10/10/2017 10:07 AM

## 2017-10-10 NOTE — Patient Instructions (Signed)
Medication Instructions: Your physician recommends that you continue on your current medications as directed. Please refer to the Current Medication list given to you today.  Increase Atorvastatin to 80 mg daily.  Labwork: Your physician recommends that you return for a FASTING lipid profile and hepatic function panel in 2 months (February 2019).   Follow-Up: Your physician wants you to follow-up in: 6 months with Dr. Gwenlyn Found. You will receive a reminder letter in the mail two months in advance. If you don't receive a letter, please call our office to schedule the follow-up appointment.  If you need a refill on your cardiac medications before your next appointment, please call your pharmacy.

## 2017-10-10 NOTE — Assessment & Plan Note (Signed)
History of dyslipidemia on atorvastatin 40 mg with recent lipid lipid profile performed during a recent hospitalization 10/04/17 revealing total cholesterol of 43, LDL 89 and HDL 35. Based on this, I'm going to increase his atorvastatin to 80 mg a day and will recheck.

## 2017-10-10 NOTE — Assessment & Plan Note (Signed)
Recently discontinued tobacco abuse 

## 2017-10-11 ENCOUNTER — Encounter (HOSPITAL_COMMUNITY): Payer: Medicare Other

## 2017-10-16 ENCOUNTER — Encounter (HOSPITAL_COMMUNITY): Payer: Medicare Other

## 2017-10-18 ENCOUNTER — Encounter (HOSPITAL_COMMUNITY): Payer: Medicare Other

## 2017-10-23 ENCOUNTER — Encounter (HOSPITAL_COMMUNITY): Payer: Medicare Other

## 2017-10-24 ENCOUNTER — Ambulatory Visit (HOSPITAL_COMMUNITY)
Admission: RE | Admit: 2017-10-24 | Discharge: 2017-10-24 | Disposition: A | Discharge status: Home / Self Care | Payer: Medicare Other | Source: Ambulatory Visit | Attending: Interventional Radiology | Admitting: Interventional Radiology

## 2017-10-24 DIAGNOSIS — I639 Cerebral infarction, unspecified: Secondary | ICD-10-CM

## 2017-10-24 DIAGNOSIS — I671 Cerebral aneurysm, nonruptured: Secondary | ICD-10-CM | POA: Diagnosis not present

## 2017-10-24 HISTORY — PX: IR RADIOLOGIST EVAL & MGMT: IMG5224

## 2017-10-25 ENCOUNTER — Encounter (HOSPITAL_COMMUNITY): Payer: Self-pay | Admitting: Interventional Radiology

## 2017-10-25 ENCOUNTER — Encounter (HOSPITAL_COMMUNITY): Payer: Medicare Other

## 2017-11-01 ENCOUNTER — Encounter (HOSPITAL_COMMUNITY): Payer: Medicare Other

## 2017-11-08 ENCOUNTER — Encounter (HOSPITAL_COMMUNITY): Payer: Self-pay | Attending: Cardiovascular Disease

## 2017-11-08 DIAGNOSIS — I2510 Atherosclerotic heart disease of native coronary artery without angina pectoris: Secondary | ICD-10-CM | POA: Insufficient documentation

## 2017-11-13 ENCOUNTER — Encounter (HOSPITAL_COMMUNITY): Payer: Self-pay

## 2017-11-14 ENCOUNTER — Other Ambulatory Visit (HOSPITAL_COMMUNITY): Payer: Self-pay | Admitting: Interventional Radiology

## 2017-11-14 DIAGNOSIS — I671 Cerebral aneurysm, nonruptured: Secondary | ICD-10-CM

## 2017-11-15 ENCOUNTER — Encounter (HOSPITAL_COMMUNITY): Payer: Self-pay

## 2017-11-20 ENCOUNTER — Encounter (HOSPITAL_COMMUNITY): Payer: Self-pay

## 2017-11-22 ENCOUNTER — Encounter (HOSPITAL_COMMUNITY): Payer: Self-pay

## 2017-11-23 ENCOUNTER — Telehealth: Payer: Self-pay | Admitting: Cardiovascular Disease

## 2017-11-23 NOTE — Telephone Encounter (Signed)
New message ° °Pt verbalized that he is returning call for RN °

## 2017-11-23 NOTE — Telephone Encounter (Signed)
Left message to call back  Notes recorded by Lorretta Harp, MD on 11/23/2017 at 1:15 PM EST Not quite at goal for secondary prevention with an LDL of 90 on atorvastatin 80 mg a day. Reinforce heart healthy diet

## 2017-11-26 NOTE — Telephone Encounter (Signed)
Patient directly nototified

## 2017-11-26 NOTE — Telephone Encounter (Signed)
lmtcb

## 2017-11-27 ENCOUNTER — Encounter (HOSPITAL_COMMUNITY): Payer: Self-pay

## 2017-11-28 ENCOUNTER — Other Ambulatory Visit: Payer: Self-pay | Admitting: Radiology

## 2017-11-29 ENCOUNTER — Encounter (HOSPITAL_COMMUNITY): Payer: Self-pay

## 2017-11-29 NOTE — Pre-Procedure Instructions (Signed)
[name].  11/29/2017     No Pharmacies Listed   Your procedure is scheduled on  Monday 12/03/17  Report to [facility] Admitting at 630 A.M.  Call this number if you have problems the morning of surgery:  [phone]   Remember:  Do not eat food or drink liquids after midnight.  Take these medicines the morning of surgery with A SIP OF WATER - AMLODIPINE (NORVASC), ASPIRIN, CARVEDILOL (COREG), PLAVIX, FINASTERIDE (PROSCAR), ISOSORBIDE MONONITRATE (IMDUR), OMEPRAZOLE (PRILOSEC)   Do not wear jewelry, make-up or nail polish.  Do not wear lotions, powders, or perfumes, or deodorant.  Do not shave 48 hours prior to surgery.  Men may shave face and neck.  Do not bring valuables to the hospital.  [facility] is not responsible for any belongings or valuables.  Contacts, dentures or bridgework may not be worn into surgery.  Leave your suitcase in the car.  After surgery it may be brought to your room.  For patients admitted to the hospital, discharge time will be determined by your treatment team.  Patients discharged the day of surgery will not be allowed to drive home.   Name and phone number of your driver:    Special instructions:  [place] - Preparing for Surgery  Before surgery, you can play an important role.  Because skin is not sterile, your skin needs to be as free of germs as possible.  You can reduce the number of germs on you skin by washing with CHG (chlorahexidine gluconate) soap before surgery.  CHG is an antiseptic cleaner which kills germs and bonds with the skin to continue killing germs even after washing.  Please DO NOT use if you have an allergy to CHG or antibacterial soaps.  If your skin becomes reddened/irritated stop using the CHG and inform your nurse when you arrive at Short Stay.  Do not shave (including legs and underarms) for at least 48 hours prior to the first CHG shower.  You may shave your face.  Please follow these  instructions carefully:   1.  Shower with CHG Soap the night before surgery and the                                morning of Surgery.  2.  If you choose to wash your hair, wash your hair first as usual with your       normal shampoo.  3.  After you shampoo, rinse your hair and body thoroughly to remove the                      Shampoo.  4.  Use CHG as you would any other liquid soap.  You can apply chg directly       to the skin and wash gently with scrungie or a clean washcloth.  5.  Apply the CHG Soap to your body ONLY FROM THE NECK DOWN.        Do not use on open wounds or open sores.  Avoid contact with your eyes,       ears, mouth and genitals (private parts).  Wash genitals (private parts)       with your normal soap.  6.  Wash thoroughly, paying special attention to the area where your surgery        will be performed.  7.  Thoroughly rinse your body  with warm water from the neck down.  8.  DO NOT shower/wash with your normal soap after using and rinsing off       the CHG Soap.  9.  Pat yourself dry with a clean towel.            10.  Wear clean pajamas.            11.  Place clean sheets on your bed the night of your first shower and do not        sleep with pets.  Day of Surgery  Do not apply any lotions/deoderants the morning of surgery.  Please wear clean clothes to the hospital/surgery center.    Please read over the following fact sheets that you were given. Pain Booklet

## 2017-11-30 ENCOUNTER — Other Ambulatory Visit: Payer: Self-pay

## 2017-11-30 ENCOUNTER — Other Ambulatory Visit: Payer: Self-pay | Admitting: General Surgery

## 2017-11-30 ENCOUNTER — Encounter (HOSPITAL_COMMUNITY): Payer: Self-pay

## 2017-11-30 ENCOUNTER — Encounter (HOSPITAL_COMMUNITY): Payer: Self-pay | Admitting: Anesthesiology

## 2017-11-30 ENCOUNTER — Encounter (HOSPITAL_COMMUNITY): Payer: Self-pay | Admitting: Emergency Medicine

## 2017-11-30 ENCOUNTER — Encounter (HOSPITAL_COMMUNITY)
Admission: RE | Admit: 2017-11-30 | Discharge: 2017-11-30 | Disposition: A | Discharge status: Home / Self Care | Payer: Medicare Other | Source: Ambulatory Visit | Attending: Interventional Radiology | Admitting: Interventional Radiology

## 2017-11-30 DIAGNOSIS — G450 Vertebro-basilar artery syndrome: Secondary | ICD-10-CM | POA: Insufficient documentation

## 2017-11-30 DIAGNOSIS — Z7902 Long term (current) use of antithrombotics/antiplatelets: Secondary | ICD-10-CM | POA: Diagnosis not present

## 2017-11-30 DIAGNOSIS — Z01818 Encounter for other preprocedural examination: Secondary | ICD-10-CM | POA: Insufficient documentation

## 2017-11-30 DIAGNOSIS — E7800 Pure hypercholesterolemia, unspecified: Secondary | ICD-10-CM | POA: Diagnosis not present

## 2017-11-30 DIAGNOSIS — F4310 Post-traumatic stress disorder, unspecified: Secondary | ICD-10-CM | POA: Diagnosis not present

## 2017-11-30 DIAGNOSIS — I2510 Atherosclerotic heart disease of native coronary artery without angina pectoris: Secondary | ICD-10-CM | POA: Diagnosis not present

## 2017-11-30 DIAGNOSIS — Z951 Presence of aortocoronary bypass graft: Secondary | ICD-10-CM | POA: Diagnosis not present

## 2017-11-30 DIAGNOSIS — Z79899 Other long term (current) drug therapy: Secondary | ICD-10-CM | POA: Insufficient documentation

## 2017-11-30 DIAGNOSIS — I10 Essential (primary) hypertension: Secondary | ICD-10-CM | POA: Insufficient documentation

## 2017-11-30 DIAGNOSIS — I252 Old myocardial infarction: Secondary | ICD-10-CM | POA: Insufficient documentation

## 2017-11-30 DIAGNOSIS — I671 Cerebral aneurysm, nonruptured: Secondary | ICD-10-CM | POA: Insufficient documentation

## 2017-11-30 DIAGNOSIS — Z7982 Long term (current) use of aspirin: Secondary | ICD-10-CM | POA: Insufficient documentation

## 2017-11-30 DIAGNOSIS — Z87891 Personal history of nicotine dependence: Secondary | ICD-10-CM | POA: Diagnosis not present

## 2017-11-30 HISTORY — DX: Unspecified convulsions: R56.9

## 2017-11-30 LAB — COMPREHENSIVE METABOLIC PANEL
ALK PHOS: 83 U/L (ref 38–126)
ALT: 29 U/L (ref 17–63)
ANION GAP: 10 (ref 5–15)
AST: 24 U/L (ref 15–41)
Albumin: 3.8 g/dL (ref 3.5–5.0)
BILIRUBIN TOTAL: 0.7 mg/dL (ref 0.3–1.2)
BUN: 24 mg/dL — AB (ref 6–20)
CO2: 23 mmol/L (ref 22–32)
CREATININE: 0.82 mg/dL (ref 0.61–1.24)
Calcium: 9 mg/dL (ref 8.9–10.3)
Chloride: 104 mmol/L (ref 101–111)
GFR calc Af Amer: >60 mL/min (ref >60)
GFR calc non Af Amer: >60 mL/min (ref >60)
GLUCOSE: 98 mg/dL (ref 65–99)
Potassium: 3.7 mmol/L (ref 3.5–5.1)
SODIUM: 137 mmol/L (ref 135–145)
Total Protein: 6.7 g/dL (ref 6.5–8.1)

## 2017-11-30 LAB — PROTIME-INR
INR: 1
Prothrombin Time: 13.1 seconds (ref 11.4–15.2)

## 2017-11-30 LAB — CBC WITH DIFFERENTIAL/PLATELET
BASOS ABS: 0 10*3/uL (ref 0.0–0.1)
BASOS PCT: 0 %
EOS ABS: 0.1 10*3/uL (ref 0.0–0.7)
Eosinophils Relative: 1 %
HEMATOCRIT: 42.3 % (ref 39.0–52.0)
Hemoglobin: 14.6 g/dL (ref 13.0–17.0)
Lymphocytes Relative: 28 %
Lymphs Abs: 2.2 10*3/uL (ref 0.7–4.0)
MCH: 32.7 pg (ref 26.0–34.0)
MCHC: 34.5 g/dL (ref 30.0–36.0)
MCV: 94.8 fL (ref 78.0–100.0)
MONOS PCT: 10 %
Monocytes Absolute: 0.8 10*3/uL (ref 0.1–1.0)
NEUTROS ABS: 4.6 10*3/uL (ref 1.7–7.7)
NEUTROS PCT: 61 %
Platelets: 179 10*3/uL (ref 150–400)
RBC: 4.46 MIL/uL (ref 4.22–5.81)
RDW: 13.2 % (ref 11.5–15.5)
WBC: 7.6 10*3/uL (ref 4.0–10.5)

## 2017-11-30 LAB — PLATELET INHIBITION P2Y12: Platelet Function  P2Y12: 171 [PRU] — ABNORMAL LOW (ref 194–418)

## 2017-11-30 LAB — APTT: aPTT: 30 seconds (ref 24–36)

## 2017-11-30 NOTE — Progress Notes (Signed)
Anesthesia Chart Review: Patient is a 72 year old male scheduled for RICA aneurysm embolization on 12/03/17 by IR Dr. Luanne Bras. On 10/03/17, he presented with episodes of ataxia, headaches, and confusion. 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 Cerebral a-gram showed RICA aneurysm. Patient's symptoms felt likely "related to occlusion of the left vertebral artery and the right posterior communicating arty bing adequate enough that the patient did not develop an ischemic infarct." RICA aneurysm intervention dicussed to decrease risk of microemboli forming within the aneurysm with subsequent distal emboli.  History includes former smoker, CAD (angina/STEMI s/p emergent CABG LIMA-LAD, SVG-OM, SVG-RCA 12/02/11, redo CABG SVG-OM 09/19/12; NSTEMI s/p DES SVG-OM graft 10/31/12 and 05/16/13), hypercholesterolemia, HTN, malaria ('60's), PTSD, vertebrobasilar insufficiency, "seizure" x1 (per PAT RN, reportedly associated with 10/03/17 admission symptoms, but I don't see this documented in ED, H&P or neurology notes), back surgery.  - PCP is with [facility]. - Cardiologist is Dr. Quay Burow, last visit 10/10/17. He is aware of 10/03/17 vertebrobasilar stroke with plans to intervene with Dr. Estanislado Pandy in the near future.  - Neurologist is with [facility] (saw Dr. Leonie Man in patient).  Meds include amlodipine, ASA 325 mg, Lipitor, Coreg, Plavix, ferrous sulfate, finasteride, Imdur, Lidoderm, Nicoderm, Nitro Sl, Prilosec, terazosin.  BP (!) 119/54   Pulse (!) 55   Temp (!) 36.4 C   Resp 20   Ht 5\' 6"  (1.676 m)   Wt 184 lb 9.6 oz (83.7 kg)   SpO2 100%   BMI 29.80 kg/m   EKG 10/05/17: SB at 52 bpm, minimal voltage criteria for LVH, may be normal variant, non-specific ST abnormality.   Echo 10/05/17: Study Conclusions - Left ventricle: The cavity size was normal. Wall thickness was   increased in a pattern  of mild LVH. Systolic function was normal.   The estimated ejection fraction was in the range of 60% to 65%.   Wall motion was normal; there were no regional wall motion   abnormalities. Features are consistent with a pseudonormal left   ventricular filling pattern, with concomitant abnormal relaxation   and increased filling pressure (grade 2 diastolic dysfunction). - Aortic valve: Trileaflet; mildly thickened, mildly calcified   leaflets. - Mitral valve: There was mild regurgitation. - Left atrium: The atrium was mildly dilated. - Right atrium: The atrium was mildly dilated. Impressions: - EF is now normal. No cardiac source of emboli was indentified.  Cardiac cath 05/16/13 (done for chest pain and high risk stress test 05/13/13):  1. Left main; TOTALLY OCCLUDED AT THE ORIGIN 2. LAD; FILLS BY RETROGRADE FLOW FROM THE ima GRAFT 3. Left circumflex; FILLS BY RETROGRADE FLOW FROM THE lima GRAFT AND CIRCUMFLEX OBTUSE MARGINAL GRAFT.  4. Right coronary artery; patent and dominant competitive flow from the RCA vein graft 5.LIMA TO LAD; widely patent 6. SVG TO PDA and PLA sequentially was widely patent     SVG TO 80% ostial stenosis and 95% distal in-stent restenosis within the SVG to the OM branch 7. Left ventriculography; RAO left ventriculogram was performed using  25 mL of Visipaque dye at 12 mL/second. The overall LVEF estimated  50-55 %  Without wall motion abnormalities IMPRESSION:[name] PCI: Successful PCI and stenting (2.75 mm x 15 mm long Xpedition drug-eluting stent) of the ostium as well as distal portion of the SVG to the OM using drug-eluting stent (2.75 mm x 15 mm long Xpedition drug-eluting stent), Angiomax, and  Effient.  Carotid U/S 09/03/15: Impressions: 1-39% BICA stenosis. Heterogenous plaque bilaterally. Normal subclavian arteries bilaterally. Patent vertebral arteries with antegrade flow. F/U PRN.  4 Vessel Cerebral Arteriogram 10/05/17: Findings. 1.Occluded LT VA at C1. 2.Irregular 7.79mm x 6.69mm aneurysm of the  prox RT ICA associated with 10 to 20 % stenosis  CTA head/neck 10/04/17: IMPRESSION: CTA NECK: 1. Gradual loss of LEFT vertebral artery contrast opacification most compatible with dissection. No significant reconstitution in the neck. 2. Atherosclerosis without hemodynamically significant stenosis. 3. 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 CTA HEAD: 1. Loss of LEFT vertebral artery contrast opacification favoring slow flow given CT HEAD and recent MRI appearance. 2. No emergent large vessel occlusion or severe stenosis. Mild intracranial atherosclerosis.  PFTs 09/03/12: FVC 3.45 (76%), FEV1 2.74 (81%), DLCO unc 26.82 (84%).   Pre-procedure labs noted. Cr 0.82. Glucose 98. CBC, PT/PTT WNL. P2y12 171. A1c 10/04/17 was 5.6%.   Patient with recent cardiology follow-up. Dr. Gwenlyn Found was aware of up-coming IR procedure. No acute CV symptoms reported from PAT visit. If no acute changes then I would anticipate that he can proceed as planned.  [name] [facility] Short Stay Center/Anesthesiology Phone [phone] 11/30/2017 3:05 PM

## 2017-11-30 NOTE — Progress Notes (Signed)
SENT EMAIL TO DR. Estanislado Pandy RE: P2Y12 171 TODAY AND ASKED THAT ORDER BE PLACED IF NEEDS REPEATED ON DOS.

## 2017-12-02 NOTE — Anesthesia Preprocedure Evaluation (Deleted)
Anesthesia Evaluation    Airway        Dental   Pulmonary former smoker,           Cardiovascular hypertension, Pt. on medications and Pt. on home beta blockers + CAD, + Past MI, + CABG and + Peripheral Vascular Disease    09/2017: Study Conclusions  - Left ventricle: The cavity size was normal. Wall thickness was increased in a pattern of mild LVH. Systolic function was normal. The estimated ejection fraction was in the range of 60% to 65%. Wall motion was normal; there were no regional wall motion abnormalities. Features are consistent with a pseudonormal left  ventricular filling pattern, with concomitant abnormal relaxation and increased filling pressure (grade 2 diastolic dysfunction). - Aortic valve: Trileaflet; mildly thickened, mildly calcified   leaflets. - Mitral valve: There was mild regurgitation. - Left atrium: The atrium was mildly dilated. - Right atrium: The atrium was mildly dilated.   Neuro/Psych Seizures -,     GI/Hepatic negative GI ROS, Neg liver ROS,   Endo/Other  negative endocrine ROS  Renal/GU negative Renal ROS     Musculoskeletal   Abdominal   Peds  Hematology negative hematology ROS (+)   Anesthesia Other Findings   Reproductive/Obstetrics                             Lab Results  Component Value Date   WBC 7.6 11/30/2017   HGB 14.6 11/30/2017   HCT 42.3 11/30/2017   MCV 94.8 11/30/2017   PLT 179 11/30/2017   Lab Results  Component Value Date   CREATININE 0.82 11/30/2017   BUN 24 (H) 11/30/2017   NA 137 11/30/2017   K 3.7 11/30/2017   CL 104 11/30/2017   CO2 23 11/30/2017    Anesthesia Physical Anesthesia Plan  ASA: III  Anesthesia Plan: General   Post-op Pain Management:    Induction: Intravenous  PONV Risk Score and Plan:   Airway Management Planned: Oral ETT  Additional Equipment: Arterial line  Intra-op Plan:   Post-operative Plan:  Extubation in OR  Informed Consent:   Plan Discussed with:   Anesthesia Plan Comments:         Anesthesia Quick Evaluation

## 2017-12-03 ENCOUNTER — Encounter (HOSPITAL_COMMUNITY)
Admission: RE | Disposition: A | Discharge status: Home / Self Care | Payer: Self-pay | Source: Ambulatory Visit | Attending: Interventional Radiology

## 2017-12-03 ENCOUNTER — Ambulatory Visit (HOSPITAL_COMMUNITY)
Admission: RE | Admit: 2017-12-03 | Discharge: 2017-12-03 | Disposition: A | Discharge status: Home / Self Care | Payer: Medicare Other | Source: Ambulatory Visit | Attending: Interventional Radiology | Admitting: Interventional Radiology

## 2017-12-03 ENCOUNTER — Encounter (HOSPITAL_COMMUNITY): Payer: Self-pay | Admitting: Certified Registered Nurse Anesthetist

## 2017-12-03 ENCOUNTER — Encounter (HOSPITAL_COMMUNITY): Payer: Self-pay

## 2017-12-03 DIAGNOSIS — I720 Aneurysm of carotid artery: Secondary | ICD-10-CM | POA: Insufficient documentation

## 2017-12-03 HISTORY — PX: RADIOLOGY WITH ANESTHESIA: SHX6223

## 2017-12-03 LAB — PLATELET INHIBITION P2Y12: PLATELET FUNCTION P2Y12: 217 [PRU] (ref 194–418)

## 2017-12-03 SURGERY — IR WITH ANESTHESIA
Anesthesia: General

## 2017-12-03 MED ORDER — SODIUM CHLORIDE 0.9 % IV SOLN: INTRAVENOUS | Status: DC

## 2017-12-03 MED ORDER — CEFAZOLIN SODIUM-DEXTROSE 2-4 GM/100ML-% IV SOLN
2.0000 g | INTRAVENOUS | Status: DC
  Filled 2017-12-03: qty 100

## 2017-12-03 MED ORDER — FENTANYL CITRATE (PF) 100 MCG/2ML IJ SOLN
INTRAMUSCULAR | Status: AC
  Filled 2017-12-03: qty 4

## 2017-12-03 MED ORDER — ASPIRIN EC 325 MG PO TBEC
325.0000 mg | DELAYED_RELEASE_TABLET | ORAL | Status: DC
  Filled 2017-12-03: qty 1

## 2017-12-03 MED ORDER — NIMODIPINE 30 MG PO CAPS
0.0000 mg | ORAL_CAPSULE | ORAL | Status: DC
  Filled 2017-12-03: qty 2

## 2017-12-03 MED ORDER — MIDAZOLAM HCL 2 MG/2ML IJ SOLN
INTRAMUSCULAR | Status: AC
  Filled 2017-12-03: qty 2

## 2017-12-03 MED ORDER — CLOPIDOGREL BISULFATE 75 MG PO TABS
75.0000 mg | ORAL_TABLET | ORAL | Status: DC
  Filled 2017-12-03: qty 1

## 2017-12-03 NOTE — Progress Notes (Signed)
Notified Melinda in IR of p2y12 217 today.  Paged Myriam Jacobson PA on call today.

## 2017-12-03 NOTE — Progress Notes (Signed)
Patient's P2Y12 is 217 today which makes it unsafe to proceed with his intervention due to risk of clotting complications.  Dr. Estanislado Pandy recommends increasing his plavix to BID and seeing how he responds to this, and reschedule him.  If he does not respond to this increase in plavix then he will be transitioned to Brilinta.  The patient and his wife understand why he is being cancelled today.  Henreitta Cea 8:05 AM 12/03/2017

## 2017-12-03 NOTE — Progress Notes (Signed)
Patient's surgery was cancelled today.  No IV was started while patient was here.  Patient left surgical short stay with his wife.

## 2017-12-04 ENCOUNTER — Encounter (HOSPITAL_COMMUNITY): Payer: Self-pay | Admitting: Interventional Radiology

## 2017-12-04 ENCOUNTER — Encounter (HOSPITAL_COMMUNITY): Payer: Self-pay

## 2017-12-06 ENCOUNTER — Encounter: Payer: Self-pay | Admitting: Neurology

## 2017-12-06 ENCOUNTER — Ambulatory Visit (INDEPENDENT_AMBULATORY_CARE_PROVIDER_SITE_OTHER): Payer: Medicare Other | Admitting: Neurology

## 2017-12-06 ENCOUNTER — Encounter (HOSPITAL_COMMUNITY): Payer: Self-pay

## 2017-12-06 VITALS — BP 142/74 | HR 60 | Ht 70.0 in

## 2017-12-06 DIAGNOSIS — I10 Essential (primary) hypertension: Secondary | ICD-10-CM

## 2017-12-06 DIAGNOSIS — G450 Vertebro-basilar artery syndrome: Secondary | ICD-10-CM

## 2017-12-06 DIAGNOSIS — E785 Hyperlipidemia, unspecified: Secondary | ICD-10-CM | POA: Diagnosis not present

## 2017-12-06 NOTE — Progress Notes (Signed)
[facility] [address] [place]. [place] [address] [phone]       OFFICE FOLLOW-UP NOTE  [name]. Date of Birth:  [birth year] Medical Record Number:  [id number]   Reason for Referral:  Vertebral basilar insufficiency   HPI: [name] is being seen today in the office for hospital follow-up for chronic vertebral artery occlusion on 09/25/17. History obtained from patient and chart review. Reviewed all radiology images and labs personally. [name]. is a 72 y.o. male with history of CAD status post CABG status post stenting, hypertension hyperlipidemia presents to the ER because of persistent difficulty walking over the last 2 weeks with headache.  Patient has been having the symptoms for the last 2 weeks initially with balance issues and eventually started developing left-sided headache.  Also has been having decreased hearing on the left ear.  Patient also complains of some blurred vision of the left side.In the ER patient had CT head followed by MRI of the brain and MRV of the brain and eventually had a CT angiogram of the head and neck and was seen by neurologist Dr. Leonel Ramsay.  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  Otherwise he denies any weakness of the upper or lower extremities.  Denies any difficulty swallowing or speaking. It was recommended that pt continue antiplatelet therapy and statins for vertebrobasilar insufficiency with dissection of the left vertebral artery. IR angio was performed on 11/30 and no intervention as needed at that time. MRI cervical was completed and negative for stenosis. Pt was cleared by PT/OT for home d/c. DAPT was started and recommended to continue for 3 months post stroke.  At todays appointment, pts symptoms have completely resolved and has not had any TIA/stroke like symptoms since discharge. Pt has stopped smoking  since discharge. Pt continues to take plavix 75mg  and aspirin 325mg  which he denies side effects such as bleeding or bruising.. Lipitor was increased by cardiologist to 80mg  daily which he is tolerating without side effects. Pt went for aneurysm embolization on 12/03/17 but was cancelled due to P2Y12 at 217. Patient plans for follow up for ICA aneurysm with Dr. Estanislado Pandy.    ROS:   14 system review of systems is positive for weight gain, joint pain, joint swelling, impotence and all other systems negative.   PMH:  Past Medical History:  Diagnosis Date  . Anginal pain ([place])   . Arthritis    "in my back and neck" (2012-09-20)  . CAD, CABG X 09 Nov 2011. re-do CABG X1 09/19/12 12/04/2011  . Coronary artery disease   . DDD (degenerative disc disease), cervical   . Hypercholesterolemia   . Hypertension   . Malaria    "I've had it a couple times in the 1960's; almost died from it" (Sep 20, 2012)  . NSTEMI (non-ST elevated myocardial infarction) ([place]) 11/2011   anterior  . NSTEMI (non-ST elevated myocardial infarction) ([place]) September 20, 2012  . PTSD (post-traumatic stress disorder)   . PTSD (post-traumatic stress disorder)   . S/P angioplasty with stent,  10/31/12 VG to OM that was new graft from 11/13 in setting of NSTEMI 05/05/2013  . Seizures ([place])    1 TIME  . Vertebro basilar insufficiency 10/03/2017    Social History:  Social History   Socioeconomic History  . Marital status: Married    Spouse name: Not on file  . Number of children: Not on file  .  Years of education: Not on file  . Highest education level: Not on file  Social Needs  . Financial resource strain: Not on file  . Food insecurity - worry: Not on file  . Food insecurity - inability: Not on file  . Transportation needs - medical: Not on file  . Transportation needs - non-medical: Not on file  Occupational History  . Not on file  Tobacco Use  . Smoking status: Former Smoker    Packs/day: 0.50    Years: 50.00    Pack  years: 25.00    Types: Cigarettes    Last attempt to quit: 12/02/2011    Years since quitting: 6.0  . Smokeless tobacco: Never Used  . Tobacco comment: NIC PATCH  Substance and Sexual Activity  . Alcohol use: Yes    Comment: RARE  . Drug use: No  . Sexual activity: Yes  Other Topics Concern  . Not on file  Social History Narrative   [place] [phone]. History of Agent Orange exposure, followed at [place]    Medications:   Current Outpatient Medications on File Prior to Visit  Medication Sig Dispense Refill  . amLODipine (NORVASC) 5 MG tablet Take 1 tablet (5 mg total) by mouth daily. 30 tablet 5  . carvedilol (COREG) 3.125 MG tablet Take 3.125 mg by mouth 2 (two) times daily with a meal.    . clopidogrel (PLAVIX) 75 MG tablet Take 1 tablet (75 mg total) by mouth daily. 30 tablet 0  . ferrous sulfate 325 (65 FE) MG tablet Take 1 tablet (325 mg total) by mouth 3 (three) times daily with meals. (Patient taking differently: Take 325 mg by mouth 2 (two) times daily with a meal. ) 30 tablet 5  . finasteride (PROSCAR) 5 MG tablet Take 5 mg by mouth daily.    . hydrochlorothiazide (HYDRODIURIL) 25 MG tablet Take 25 mg by mouth daily.    Marland Kitchen ibuprofen (ADVIL,MOTRIN) 400 MG tablet Take 400 mg by mouth every 6 (six) hours as needed for mild pain.    . isosorbide mononitrate (IMDUR) 30 MG 24 hr tablet Take 1 tablet (30 mg total) by mouth daily. 90 tablet 3  . lidocaine (LIDODERM) 5 % Place 1 patch onto the skin daily as needed (for pain). Remove & Discard patch within 12 hours or as directed by MD     . nicotine (NICODERM CQ - DOSED IN MG/24 HOURS) 21 mg/24hr patch Place 21 mg onto the skin daily.    . nitroGLYCERIN (NITROSTAT) 0.4 MG SL tablet Place 1 tablet (0.4 mg total) under the tongue every 5 (five) minutes x 3 doses as needed for chest pain. 25 tablet 5  . omeprazole (PRILOSEC) 20 MG capsule Take 20 mg by mouth 2 (two) times daily.    Marland Kitchen terazosin (HYTRIN) 10 MG capsule Take 10 mg by mouth at  bedtime.    Marland Kitchen aspirin 325 MG EC tablet Take 1 tablet (325 mg total) by mouth daily. 30 tablet 0  . atorvastatin (LIPITOR) 80 MG tablet Take 1 tablet (80 mg total) by mouth daily. 30 tablet 6   No current facility-administered medications on file prior to visit.     Allergies:  No Known Allergies  Physical Exam General: well developed, well nourished, seated, in no evident distress Head: head normocephalic and atraumatic.   Neck: supple with no carotid or supraclavicular bruits Cardiovascular: regular rate and rhythm, no murmurs; R radial pulse greater than L radial pulse.  Musculoskeletal: no deformity  Skin:  no rash/petichiae Vascular:  Normal pulses all extremities  Neurologic Exam Mental Status: Awake and fully alert. Oriented to place and time. Recent and remote memory intact. Attention span, concentration and fund of knowledge appropriate. Mood and affect appropriate.  Cranial Nerves: Fundoscopic exam reveals sharp disc margins. Pupils equal, briskly reactive to light. Extraocular movements full without nystagmus. Visual fields full to confrontation. Hearing intact. Facial sensation intact. Face, tongue, palate moves normally and symmetrically.  Motor: Normal bulk and tone. Normal strength in all tested extremity muscles. Sensory.: intact to touch , pinprick , position and vibratory sensation.  Coordination: Rapid alternating movements normal in all extremities. Finger-to-nose and heel-to-shin performed accurately bilaterally. Gait and Station: Arises from chair without difficulty. Stance is normal. Gait demonstrates normal stride length and balance . Able to heel, toe and tandem walk without difficulty.  Reflexes: 1+ and symmetric. Toes downgoing.   NIHSS  0 Modified Rankin  0   ASSESSMENT: [name] is a 72 year old caucasian male with recent admission for  Vertebrobasilar TIAs followingchronic vertebral artery occlusion on 09/25/17. Vascular risk factors are CAD s/p CABG,  HTN, and HLD. Incidental finding of right carotid aneurysm etiology unclear no definite evidence to suggest dissection or old injury   PLAN: I had a long d/w patient about his recent stroke, risk for recurrent VB TIA, personally independently reviewed imaging studies and stroke evaluation results and answered questions.Continue aspirin 325 mg daily and clopidogrel 75 mg daily  for secondary stroke prevention and maintain strict control of hypertension with blood pressure goal below 130/90, diabetes with hemoglobin A1c goal below 6.5% and lipids with LDL cholesterol goal below 70 mg/dL. Continue to take lipitor 80mg . I also advised the patient to eat a healthy diet with plenty of whole grains, cereals, fruits and vegetables, exercise regularly and maintain ideal body weight. Continue smoking cessation. Followup in the future with me in 6 months.continue follow-up for right carotid aneurysm but Dr. Estanislado Pandy Greater than 50% of time during this 25 minute visit was spent on counseling,explanation of diagnosisof TIA and carotid aneurysm, planning of further management, discussion with patient and family and coordination of care.  Antony Contras, MD  [facility] [address] [place], [place] [address]  Phone [phone] Fax [phone]

## 2017-12-06 NOTE — Patient Instructions (Signed)
I had a long d/w patient about his recent stroke, risk for recurrent TIA, personally independently reviewed imaging studies and stroke evaluation results and answered questions.Continue aspirin 325 mg daily and clopidogrel 75 mg daily  for secondary stroke prevention and maintain strict control of hypertension with blood pressure goal below 130/90, diabetes with hemoglobin A1c goal below 6.5% and lipids with LDL cholesterol goal below 70 mg/dL. Continue to take lipitor 80mg . I also advised the patient to eat a healthy diet with plenty of whole grains, cereals, fruits and vegetables, exercise regularly and maintain ideal body weight. Continue smoking cessation. Followup in the future with me in 6 months.

## 2017-12-11 ENCOUNTER — Encounter (HOSPITAL_COMMUNITY): Payer: Medicare Other | Attending: Cardiovascular Disease

## 2017-12-11 DIAGNOSIS — I2510 Atherosclerotic heart disease of native coronary artery without angina pectoris: Secondary | ICD-10-CM | POA: Insufficient documentation

## 2017-12-13 ENCOUNTER — Encounter (HOSPITAL_COMMUNITY): Payer: Medicare Other

## 2017-12-18 ENCOUNTER — Encounter (HOSPITAL_COMMUNITY): Payer: Medicare Other

## 2017-12-20 ENCOUNTER — Encounter (HOSPITAL_COMMUNITY): Payer: Medicare Other

## 2017-12-25 ENCOUNTER — Encounter (HOSPITAL_COMMUNITY): Payer: Medicare Other

## 2017-12-27 ENCOUNTER — Other Ambulatory Visit (HOSPITAL_COMMUNITY): Payer: Self-pay | Admitting: Radiology

## 2017-12-27 ENCOUNTER — Encounter (HOSPITAL_COMMUNITY): Payer: Medicare Other

## 2017-12-27 DIAGNOSIS — G450 Vertebro-basilar artery syndrome: Secondary | ICD-10-CM

## 2017-12-27 DIAGNOSIS — I2510 Atherosclerotic heart disease of native coronary artery without angina pectoris: Secondary | ICD-10-CM | POA: Diagnosis not present

## 2017-12-27 LAB — PLATELET INHIBITION P2Y12: Platelet Function  P2Y12: 186 [PRU] — ABNORMAL LOW (ref 194–418)

## 2017-12-28 ENCOUNTER — Telehealth (HOSPITAL_COMMUNITY): Payer: Self-pay | Admitting: *Deleted

## 2017-12-28 NOTE — Telephone Encounter (Signed)
Called and spoke with patient regarding changing medication to Brilinta.  Pt gets medication form the [place].  He asked that I call and get pricing for from [employer].  Called [facility] and spoke with pharmacy.  1 month supply would be $448, 1 week supply would be $114.00.  Called patient he will call his doctor at the [facility] Monday and call Anderson Malta with how to get this medication quicker from the [facility] [place].  Per Dr. Estanislado Pandy wants patient to take Brilinta 90mg  BID and ASA 81mg  daily.  Come in after taking 1 week and get recheck of p2Y12

## 2018-01-01 ENCOUNTER — Encounter (HOSPITAL_COMMUNITY): Payer: Medicare Other

## 2018-01-03 ENCOUNTER — Encounter (HOSPITAL_COMMUNITY): Payer: Medicare Other

## 2018-01-07 ENCOUNTER — Telehealth (HOSPITAL_COMMUNITY): Payer: Self-pay | Admitting: *Deleted

## 2018-01-15 ENCOUNTER — Telehealth (HOSPITAL_COMMUNITY): Payer: Self-pay | Admitting: Radiology

## 2018-01-15 ENCOUNTER — Other Ambulatory Visit (HOSPITAL_COMMUNITY): Payer: Self-pay | Admitting: Radiology

## 2018-01-15 DIAGNOSIS — Z95820 Peripheral vascular angioplasty status with implants and grafts: Secondary | ICD-10-CM

## 2018-01-15 LAB — PLATELET INHIBITION P2Y12: Platelet Function  P2Y12: 131 [PRU] — ABNORMAL LOW (ref 194–418)

## 2018-01-15 NOTE — Telephone Encounter (Signed)
Called [name] and discussed scheduling his aneurysm treatment. [name] will be scheduled for treatment on 01/30/18. He is currently on Plavix 75mg  1 daily and Aspirin 325mg  1 daily. He came in on 01/15/18 for a P2Y12 check. His result was 131. Per Dr. Estanislado Pandy the [name] will remain on those medications and not change to Brilinta. He will the patient an extra Plavix dose the morning of his procedure. [name] was agreeable to this plan of care. JM

## 2018-01-25 ENCOUNTER — Other Ambulatory Visit: Payer: Self-pay | Admitting: Radiology

## 2018-01-25 NOTE — Pre-Procedure Instructions (Signed)
Darrell Mayer Jr.  01/25/2018      [id number] - [place], [place] - [address] [place] [address] Phone: [phone] Fax: [phone]    Your procedure is scheduled on Wednesday, March 27th   Report to [facility] Admitting at 7:00 AM             (posted procedural time 9am - 12noon)   Call this number if you have problems the morning of surgery:  [phone]   Remember:                     7 days prior to surgery, STOP TAKING any Vitamins, Herbal Supplements, Anti-inflammatories, Blood thinners.   Do not eat food or drink liquids after midnight, Tuesday.   Take these medicines the morning of surgery with A SIP OF WATER : Amlodipine, Carvedilol,    Imdur, Omeprazole.   Do not wear jewelry - no rings or watches.  Do not wear lotions, colognes or deodorant.             Men may shave face and neck.  Do not bring valuables to the hospital.  [facility] is not responsible for any belongings or valuables.  Contacts, dentures or bridgework may not be worn into surgery.  Leave your suitcase in the car.  After surgery it may be brought to your room.  For patients admitted to the hospital, discharge time will be determined by your treatment team.  Please read over the following fact sheets that you were given. Pain Booklet and Surgical Site Infection Prevention      [facility]- Preparing For Surgery  Before surgery, you can play an important role. Because skin is not sterile, your skin needs to be as free of germs as possible. You can reduce the number of germs on your skin by washing with CHG (chlorahexidine gluconate) Soap before surgery.  CHG is an antiseptic cleaner which kills germs and bonds with the skin to continue killing germs even after washing.  Please do not use if you have an allergy to CHG or antibacterial soaps. If your skin becomes reddened/irritated stop using the CHG.  Do not shave  (including legs and underarms) for at least 48 hours prior to first CHG shower. It is OK to shave your face.  Please follow these instructions carefully.   1. Shower the NIGHT BEFORE SURGERY and the MORNING OF SURGERY with CHG.   2. If you chose to wash your hair, wash your hair first as usual with your normal shampoo.  3. After you shampoo, rinse your hair and body thoroughly to remove the shampoo.  4. Use CHG as you would any other liquid soap. You can apply CHG directly to the skin and wash gently with a scrungie or a clean washcloth.   5. Apply the CHG Soap to your body ONLY FROM THE NECK DOWN.  Do not use on open wounds or open sores. Avoid contact with your eyes, ears, mouth and genitals (private parts). Wash Face and genitals (private parts)  with your normal soap.  6. Wash thoroughly, paying special attention to the area where your surgery will be performed.  7. Thoroughly rinse your body with warm water from the neck down.  8. DO NOT shower/wash with your normal soap after using and rinsing off the CHG Soap.  9. Pat yourself dry with a CLEAN TOWEL.  10. Wear  CLEAN PAJAMAS to bed the night before surgery, wear comfortable clothes the morning of surgery  11. Place CLEAN SHEETS on your bed the night of your first shower and DO NOT SLEEP WITH PETS.    Day of Surgery: Do not apply any deodorants/lotions. Please wear clean clothes to the hospital/surgery center.

## 2018-01-28 ENCOUNTER — Encounter (HOSPITAL_COMMUNITY)
Admission: RE | Admit: 2018-01-28 | Discharge: 2018-01-28 | Disposition: A | Discharge status: Home / Self Care | Payer: Medicare Other | Source: Ambulatory Visit | Attending: Interventional Radiology | Admitting: Interventional Radiology

## 2018-01-28 ENCOUNTER — Other Ambulatory Visit: Payer: Self-pay

## 2018-01-28 ENCOUNTER — Encounter (HOSPITAL_COMMUNITY): Payer: Self-pay

## 2018-01-28 LAB — CBC WITH DIFFERENTIAL/PLATELET
BASOS ABS: 0 10*3/uL (ref 0.0–0.1)
BASOS PCT: 0 %
Eosinophils Absolute: 0.1 10*3/uL (ref 0.0–0.7)
Eosinophils Relative: 1 %
HCT: 43.7 % (ref 39.0–52.0)
Hemoglobin: 14.8 g/dL (ref 13.0–17.0)
LYMPHS ABS: 1.9 10*3/uL (ref 0.7–4.0)
Lymphocytes Relative: 26 %
MCH: 32.3 pg (ref 26.0–34.0)
MCHC: 33.9 g/dL (ref 30.0–36.0)
MCV: 95.4 fL (ref 78.0–100.0)
MONOS PCT: 13 %
Monocytes Absolute: 0.9 10*3/uL (ref 0.1–1.0)
NEUTROS ABS: 4.3 10*3/uL (ref 1.7–7.7)
NEUTROS PCT: 60 %
PLATELETS: 164 10*3/uL (ref 150–400)
RBC: 4.58 MIL/uL (ref 4.22–5.81)
RDW: 13.7 % (ref 11.5–15.5)
WBC: 7.1 10*3/uL (ref 4.0–10.5)

## 2018-01-28 LAB — COMPREHENSIVE METABOLIC PANEL
ALBUMIN: 3.7 g/dL (ref 3.5–5.0)
ALT: 37 U/L (ref 17–63)
AST: 29 U/L (ref 15–41)
Alkaline Phosphatase: 89 U/L (ref 38–126)
Anion gap: 10 (ref 5–15)
BUN: 21 mg/dL — AB (ref 6–20)
CHLORIDE: 104 mmol/L (ref 101–111)
CO2: 25 mmol/L (ref 22–32)
CREATININE: 1.01 mg/dL (ref 0.61–1.24)
Calcium: 9 mg/dL (ref 8.9–10.3)
GFR calc Af Amer: >60 mL/min (ref >60)
GFR calc non Af Amer: >60 mL/min (ref >60)
Glucose, Bld: 96 mg/dL (ref 65–99)
POTASSIUM: 3.6 mmol/L (ref 3.5–5.1)
SODIUM: 139 mmol/L (ref 135–145)
Total Bilirubin: 0.6 mg/dL (ref 0.3–1.2)
Total Protein: 6.9 g/dL (ref 6.5–8.1)

## 2018-01-28 LAB — APTT: aPTT: 30 seconds (ref 24–36)

## 2018-01-28 LAB — PROTIME-INR
INR: 0.99
Prothrombin Time: 13 seconds (ref 11.4–15.2)

## 2018-01-28 LAB — PLATELET INHIBITION P2Y12: PLATELET FUNCTION P2Y12: 124 [PRU] — AB (ref 194–418)

## 2018-01-28 NOTE — Progress Notes (Signed)
Cardio is Dr. Adora Fridge  LOV 10/2017 Has had cath 2013, 2 CABG in 2013, Stress test 2014, Echo 09/2017 PCP is at the [facility] [place] in [place] 10/2017 He was scheduled to have this done in Jan, cancelled d/t p2y results.  Nothing has changed.

## 2018-01-30 ENCOUNTER — Encounter (HOSPITAL_COMMUNITY)
Admission: AD | Disposition: A | Discharge status: Home / Self Care | Payer: Self-pay | Source: Ambulatory Visit | Attending: Interventional Radiology

## 2018-01-30 ENCOUNTER — Ambulatory Visit (HOSPITAL_COMMUNITY): Payer: Medicare Other | Admitting: Anesthesiology

## 2018-01-30 ENCOUNTER — Ambulatory Visit (HOSPITAL_COMMUNITY)
Admission: RE | Admit: 2018-01-30 | Discharge: 2018-01-30 | Disposition: A | Discharge status: Home / Self Care | Payer: Medicare Other | Source: Ambulatory Visit | Attending: Interventional Radiology | Admitting: Interventional Radiology

## 2018-01-30 ENCOUNTER — Inpatient Hospital Stay (HOSPITAL_COMMUNITY)
Admission: AD | Admit: 2018-01-30 | Discharge: 2018-01-31 | DRG: 254 | Disposition: A | Discharge status: Home / Self Care | Payer: Medicare Other | Source: Ambulatory Visit | Attending: Interventional Radiology | Admitting: Interventional Radiology

## 2018-01-30 ENCOUNTER — Other Ambulatory Visit: Payer: Self-pay

## 2018-01-30 ENCOUNTER — Encounter (HOSPITAL_COMMUNITY): Payer: Self-pay

## 2018-01-30 DIAGNOSIS — E7800 Pure hypercholesterolemia, unspecified: Secondary | ICD-10-CM | POA: Diagnosis present

## 2018-01-30 DIAGNOSIS — I252 Old myocardial infarction: Secondary | ICD-10-CM

## 2018-01-30 DIAGNOSIS — F4310 Post-traumatic stress disorder, unspecified: Secondary | ICD-10-CM | POA: Diagnosis present

## 2018-01-30 DIAGNOSIS — I2510 Atherosclerotic heart disease of native coronary artery without angina pectoris: Secondary | ICD-10-CM | POA: Diagnosis present

## 2018-01-30 DIAGNOSIS — Z7982 Long term (current) use of aspirin: Secondary | ICD-10-CM

## 2018-01-30 DIAGNOSIS — Z87891 Personal history of nicotine dependence: Secondary | ICD-10-CM | POA: Diagnosis not present

## 2018-01-30 DIAGNOSIS — Z951 Presence of aortocoronary bypass graft: Secondary | ICD-10-CM | POA: Diagnosis not present

## 2018-01-30 DIAGNOSIS — I10 Essential (primary) hypertension: Secondary | ICD-10-CM | POA: Diagnosis present

## 2018-01-30 DIAGNOSIS — I671 Cerebral aneurysm, nonruptured: Secondary | ICD-10-CM | POA: Diagnosis present

## 2018-01-30 DIAGNOSIS — Z79899 Other long term (current) drug therapy: Secondary | ICD-10-CM | POA: Diagnosis not present

## 2018-01-30 DIAGNOSIS — I720 Aneurysm of carotid artery: Principal | ICD-10-CM | POA: Diagnosis present

## 2018-01-30 DIAGNOSIS — Z8673 Personal history of transient ischemic attack (TIA), and cerebral infarction without residual deficits: Secondary | ICD-10-CM | POA: Diagnosis not present

## 2018-01-30 DIAGNOSIS — K219 Gastro-esophageal reflux disease without esophagitis: Secondary | ICD-10-CM | POA: Diagnosis present

## 2018-01-30 DIAGNOSIS — Z7902 Long term (current) use of antithrombotics/antiplatelets: Secondary | ICD-10-CM | POA: Diagnosis not present

## 2018-01-30 DIAGNOSIS — I7774 Dissection of vertebral artery: Secondary | ICD-10-CM | POA: Diagnosis not present

## 2018-01-30 HISTORY — PX: RADIOLOGY WITH ANESTHESIA: SHX6223

## 2018-01-30 HISTORY — PX: IR INTRAVSC STENT CERV CAROTID W/O EMB-PROT MOD SED INC ANGIO: IMG2304

## 2018-01-30 LAB — CBC
HCT: 38 % — ABNORMAL LOW (ref 39.0–52.0)
HEMOGLOBIN: 12.9 g/dL — AB (ref 13.0–17.0)
MCH: 32.3 pg (ref 26.0–34.0)
MCHC: 33.9 g/dL (ref 30.0–36.0)
MCV: 95 fL (ref 78.0–100.0)
Platelets: 132 10*3/uL — ABNORMAL LOW (ref 150–400)
RBC: 4 MIL/uL — AB (ref 4.22–5.81)
RDW: 13.9 % (ref 11.5–15.5)
WBC: 7.4 10*3/uL (ref 4.0–10.5)

## 2018-01-30 LAB — MRSA PCR SCREENING: MRSA BY PCR: NEGATIVE

## 2018-01-30 LAB — POCT ACTIVATED CLOTTING TIME
ACTIVATED CLOTTING TIME: 197 s
Activated Clotting Time: 180 seconds

## 2018-01-30 LAB — HEPARIN LEVEL (UNFRACTIONATED): Heparin Unfractionated: <0.1 IU/mL — ABNORMAL LOW (ref 0.30–0.70)

## 2018-01-30 LAB — PLATELET INHIBITION P2Y12: PLATELET FUNCTION P2Y12: 142 [PRU] — AB (ref 194–418)

## 2018-01-30 SURGERY — RADIOLOGY WITH ANESTHESIA
Anesthesia: Monitor Anesthesia Care

## 2018-01-30 MED ORDER — OXYCODONE HCL 5 MG/5ML PO SOLN: 5.0000 mg | Freq: Once | ORAL | Status: DC | PRN

## 2018-01-30 MED ORDER — CEFAZOLIN SODIUM-DEXTROSE 2-4 GM/100ML-% IV SOLN
2.0000 g | INTRAVENOUS | Status: AC
  Administered 2018-01-30: 2 g via INTRAVENOUS
  Filled 2018-01-30: qty 100

## 2018-01-30 MED ORDER — SODIUM CHLORIDE 0.9 % IV SOLN
INTRAVENOUS | Status: DC
  Administered 2018-01-30: 08:00:00 via INTRAVENOUS

## 2018-01-30 MED ORDER — CEFAZOLIN SODIUM-DEXTROSE 2-4 GM/100ML-% IV SOLN
INTRAVENOUS | Status: AC
  Filled 2018-01-30: qty 100

## 2018-01-30 MED ORDER — FENTANYL CITRATE (PF) 100 MCG/2ML IJ SOLN
INTRAMUSCULAR | Status: DC | PRN
  Administered 2018-01-30: 25 ug via INTRAVENOUS

## 2018-01-30 MED ORDER — HEPARIN SODIUM (PORCINE) 1000 UNIT/ML IJ SOLN
INTRAMUSCULAR | Status: DC | PRN
  Administered 2018-01-30: 500 [IU] via INTRAVENOUS
  Administered 2018-01-30: 3000 [IU] via INTRAVENOUS
  Administered 2018-01-30: 500 [IU] via INTRAVENOUS

## 2018-01-30 MED ORDER — HEPARIN (PORCINE) IN NACL 100-0.45 UNIT/ML-% IJ SOLN
700.0000 [IU]/h | INTRAMUSCULAR | Status: DC
  Filled 2018-01-30: qty 250

## 2018-01-30 MED ORDER — ACETAMINOPHEN 160 MG/5ML PO SOLN: 650.0000 mg | ORAL | Status: DC | PRN

## 2018-01-30 MED ORDER — NICARDIPINE HCL IN NACL 20-0.86 MG/200ML-% IV SOLN
INTRAVENOUS | Status: DC | PRN
  Administered 2018-01-30: 2 mg/h via INTRAVENOUS

## 2018-01-30 MED ORDER — CLOPIDOGREL BISULFATE 75 MG PO TABS
75.0000 mg | ORAL_TABLET | Freq: Every day | ORAL | Status: DC
  Administered 2018-01-31: 75 mg via ORAL
  Filled 2018-01-30: qty 1

## 2018-01-30 MED ORDER — CLOPIDOGREL BISULFATE 75 MG PO TABS
ORAL_TABLET | ORAL | Status: AC
  Administered 2018-01-30: 75 mg via ORAL
  Filled 2018-01-30: qty 1

## 2018-01-30 MED ORDER — FINASTERIDE 5 MG PO TABS
5.0000 mg | ORAL_TABLET | Freq: Every day | ORAL | Status: DC
  Administered 2018-01-30: 5 mg via ORAL
  Filled 2018-01-30 (×2): qty 1

## 2018-01-30 MED ORDER — FENTANYL CITRATE (PF) 100 MCG/2ML IJ SOLN: 25.0000 ug | INTRAMUSCULAR | Status: DC | PRN

## 2018-01-30 MED ORDER — GLYCOPYRROLATE 0.2 MG/ML IJ SOLN
INTRAMUSCULAR | Status: DC | PRN
  Administered 2018-01-30: 0.2 mg via INTRAVENOUS

## 2018-01-30 MED ORDER — SODIUM CHLORIDE 0.9 % IV SOLN
INTRAVENOUS | Status: DC | PRN
  Administered 2018-01-30: 08:00:00 via INTRAVENOUS

## 2018-01-30 MED ORDER — ASPIRIN 325 MG PO TABS
325.0000 mg | ORAL_TABLET | Freq: Every day | ORAL | Status: DC
  Administered 2018-01-31: 325 mg via ORAL
  Filled 2018-01-30: qty 1

## 2018-01-30 MED ORDER — EPTIFIBATIDE 20 MG/10ML IV SOLN
INTRAVENOUS | Status: AC
  Filled 2018-01-30: qty 10

## 2018-01-30 MED ORDER — NITROGLYCERIN 1 MG/10 ML FOR IR/CATH LAB
INTRA_ARTERIAL | Status: AC
  Filled 2018-01-30: qty 10

## 2018-01-30 MED ORDER — NIMODIPINE 30 MG PO CAPS
0.0000 mg | ORAL_CAPSULE | ORAL | Status: DC
  Filled 2018-01-30: qty 2

## 2018-01-30 MED ORDER — CARVEDILOL 3.125 MG PO TABS
3.1250 mg | ORAL_TABLET | Freq: Two times a day (BID) | ORAL | Status: DC
  Administered 2018-01-30: 3.125 mg via ORAL
  Filled 2018-01-30 (×2): qty 1

## 2018-01-30 MED ORDER — MIDAZOLAM HCL 5 MG/5ML IJ SOLN
INTRAMUSCULAR | Status: DC | PRN
  Administered 2018-01-30: 1 mg via INTRAVENOUS

## 2018-01-30 MED ORDER — HEPARIN (PORCINE) IN NACL 100-0.45 UNIT/ML-% IJ SOLN
850.0000 [IU]/h | INTRAMUSCULAR | Status: DC
  Filled 2018-01-30: qty 250

## 2018-01-30 MED ORDER — NICARDIPINE HCL IN NACL 20-0.86 MG/200ML-% IV SOLN
0.0000 mg/h | INTRAVENOUS | Status: DC
  Administered 2018-01-30: 2.5 mg/h via INTRAVENOUS
  Administered 2018-01-30: 2 mg/h via INTRAVENOUS
  Filled 2018-01-30 (×2): qty 200

## 2018-01-30 MED ORDER — HYDROCHLOROTHIAZIDE 25 MG PO TABS
25.0000 mg | ORAL_TABLET | Freq: Every day | ORAL | Status: DC
  Administered 2018-01-31: 25 mg via ORAL
  Filled 2018-01-30: qty 1

## 2018-01-30 MED ORDER — ACETAMINOPHEN 650 MG RE SUPP: 650.0000 mg | RECTAL | Status: DC | PRN

## 2018-01-30 MED ORDER — ACETAMINOPHEN 325 MG PO TABS: 650.0000 mg | ORAL_TABLET | ORAL | Status: DC | PRN

## 2018-01-30 MED ORDER — ROCURONIUM BROMIDE 50 MG/5ML IV SOLN
1.0000 mg/kg | Freq: Once | INTRAVENOUS | Status: DC
  Filled 2018-01-30: qty 8.36

## 2018-01-30 MED ORDER — ISOSORBIDE MONONITRATE ER 30 MG PO TB24
30.0000 mg | ORAL_TABLET | Freq: Every day | ORAL | Status: DC
Start: 2018-01-31 — End: 2018-01-31
  Administered 2018-01-31: 30 mg via ORAL
  Filled 2018-01-30: qty 1

## 2018-01-30 MED ORDER — OXYCODONE HCL 5 MG PO TABS: 5.0000 mg | ORAL_TABLET | Freq: Once | ORAL | Status: DC | PRN

## 2018-01-30 MED ORDER — ASPIRIN 81 MG PO CHEW: 324.0000 mg | CHEWABLE_TABLET | Freq: Every day | ORAL | Status: DC

## 2018-01-30 MED ORDER — ONDANSETRON HCL 4 MG/2ML IJ SOLN: 4.0000 mg | Freq: Once | INTRAMUSCULAR | Status: DC | PRN

## 2018-01-30 MED ORDER — NIMODIPINE 30 MG PO CAPS
ORAL_CAPSULE | ORAL | Status: AC
  Filled 2018-01-30: qty 2

## 2018-01-30 MED ORDER — ROCURONIUM BROMIDE 50 MG/5ML IV SOLN
100.0000 mg | Freq: Once | INTRAVENOUS | Status: DC
  Filled 2018-01-30: qty 10

## 2018-01-30 MED ORDER — ASPIRIN EC 325 MG PO TBEC
DELAYED_RELEASE_TABLET | ORAL | Status: AC
  Administered 2018-01-30: 325 mg via ORAL
  Filled 2018-01-30: qty 1

## 2018-01-30 MED ORDER — CLOPIDOGREL BISULFATE 75 MG PO TABS: 75.0000 mg | ORAL_TABLET | Freq: Every day | ORAL | Status: DC

## 2018-01-30 MED ORDER — IOPAMIDOL (ISOVUE-300) INJECTION 61%
INTRAVENOUS | Status: AC
  Filled 2018-01-30: qty 50

## 2018-01-30 MED ORDER — CLOPIDOGREL BISULFATE 75 MG PO TABS
75.0000 mg | ORAL_TABLET | ORAL | Status: AC
  Administered 2018-01-30 (×2): 75 mg via ORAL
  Filled 2018-01-30: qty 1

## 2018-01-30 MED ORDER — HEPARIN (PORCINE) IN NACL 100-0.45 UNIT/ML-% IJ SOLN
INTRAMUSCULAR | Status: AC
  Filled 2018-01-30: qty 250

## 2018-01-30 MED ORDER — TERAZOSIN HCL 5 MG PO CAPS
10.0000 mg | ORAL_CAPSULE | Freq: Every day | ORAL | Status: DC
  Administered 2018-01-30: 10 mg via ORAL
  Filled 2018-01-30: qty 2

## 2018-01-30 MED ORDER — AMLODIPINE BESYLATE 5 MG PO TABS
5.0000 mg | ORAL_TABLET | Freq: Every day | ORAL | Status: DC
  Administered 2018-01-31: 5 mg via ORAL
  Filled 2018-01-30: qty 1

## 2018-01-30 MED ORDER — HEPARIN (PORCINE) IN NACL 100-0.45 UNIT/ML-% IJ SOLN
500.0000 [IU]/h | INTRAMUSCULAR | Status: DC
  Administered 2018-01-30: 500 [IU]/h via INTRAVENOUS

## 2018-01-30 MED ORDER — LIDOCAINE HCL 1 % IJ SOLN
INTRAMUSCULAR | Status: AC
  Filled 2018-01-30: qty 20

## 2018-01-30 MED ORDER — SODIUM CHLORIDE 0.9 % IV SOLN
INTRAVENOUS | Status: DC
  Administered 2018-01-30 (×2): via INTRAVENOUS

## 2018-01-30 MED ORDER — ASPIRIN EC 325 MG PO TBEC
325.0000 mg | DELAYED_RELEASE_TABLET | ORAL | Status: AC
  Administered 2018-01-30: 325 mg via ORAL
  Filled 2018-01-30: qty 1

## 2018-01-30 MED ORDER — CLOPIDOGREL BISULFATE 75 MG PO TABS
ORAL_TABLET | ORAL | Status: AC
Start: 2018-01-30 — End: 2018-01-30
  Administered 2018-01-30: 75 mg via ORAL
  Filled 2018-01-30: qty 1

## 2018-01-30 MED ORDER — PANTOPRAZOLE SODIUM 40 MG PO TBEC
40.0000 mg | DELAYED_RELEASE_TABLET | Freq: Every day | ORAL | Status: DC
Start: 2018-01-31 — End: 2018-01-31
  Administered 2018-01-31: 40 mg via ORAL
  Filled 2018-01-30: qty 1

## 2018-01-30 MED ORDER — IOPAMIDOL (ISOVUE-300) INJECTION 61%
INTRAVENOUS | Status: AC
  Administered 2018-01-30: 70 mL
  Filled 2018-01-30: qty 300

## 2018-01-30 NOTE — Procedures (Signed)
S/P Rt common carotid arteriogram. RT CFA approach. Findings. 1.Placement od RT ICA/CCA stent across prox RT ICA aneurysm /pseudoaneurysm with intra aneurysmal stasis

## 2018-01-30 NOTE — Anesthesia Procedure Notes (Signed)
Procedure Name: MAC Date/Time: 01/30/2018 9:00 AM Performed by: Izora Gala, CRNA Pre-anesthesia Checklist: Patient identified, Emergency Drugs available, Suction available and Patient being monitored Patient Re-evaluated:Patient Re-evaluated prior to induction Oxygen Delivery Method: Nasal cannula Induction Type: IV induction Placement Confirmation: positive ETCO2

## 2018-01-30 NOTE — Sedation Documentation (Signed)
Soft collar applied to patients neck.

## 2018-01-30 NOTE — Transfer of Care (Signed)
Immediate Anesthesia Transfer of Care Note  Patient: [name].  Procedure(s) Performed: EMBOLIZATION (N/A )  Patient Location: PACU  Anesthesia Type:MAC  Level of Consciousness: awake, alert , oriented and patient cooperative  Airway & Oxygen Therapy: Patient Spontanous Breathing  Post-op Assessment: Report given to RN, Post -op Vital signs reviewed and stable, Patient moving all extremities and Patient moving all extremities X 4  Post vital signs: Reviewed and stable  Last Vitals:  Vitals Value Taken Time  BP 129/71 01/30/2018 11:36 AM  Temp 36.6 C 01/30/2018 11:34 AM  Pulse 53 01/30/2018 11:46 AM  Resp 10 01/30/2018 11:46 AM  SpO2 100 % 01/30/2018 11:46 AM  Vitals shown include unvalidated device data.  Last Pain:  Vitals:   01/30/18 1134  TempSrc:   PainSc: (P) 0-No pain         Complications: No apparent anesthesia complications

## 2018-01-30 NOTE — Anesthesia Postprocedure Evaluation (Signed)
Anesthesia Post Note  Patient: [name].  Procedure(s) Performed: EMBOLIZATION (N/A )     Patient location during evaluation: PACU Anesthesia Type: MAC Level of consciousness: awake and alert Pain management: pain level controlled Vital Signs Assessment: post-procedure vital signs reviewed and stable Respiratory status: spontaneous breathing, nonlabored ventilation and respiratory function stable Cardiovascular status: stable and blood pressure returned to baseline Anesthetic complications: no    Last Vitals:  Vitals:   01/30/18 1230 01/30/18 1245  BP:  139/68  Pulse:    Resp:  13  Temp: 36.6 C 36.8 C  SpO2:  98%    Last Pain:  Vitals:   01/30/18 1245  TempSrc: Oral  PainSc: 0-No pain                 Audry Pili

## 2018-01-30 NOTE — H&P (Signed)
Chief Complaint: Patient was seen in consultation today for right internal carotid aneurysm  Supervising Physician: Luanne Bras  Patient Status: [facility] - Out-pt  History of Present Illness: [name]. is a 72 y.o. male with past medical history of arthritis, CAD, NSTEMI, HTN, PTSD, and CVA in November 2018 who is known to neurointerventional radiology after angiogram 10/08/17 which showed: -Angiographically occluded left vertebral artery at the level of C1. -Stump of the occluded left vertebrobasilar junction just proximal to the confluence to form the basilar artery. -Approximately 7.4 mm x 6.1 mm irregular saccular aneurysm involving the proximal right internal carotid artery just distal to the bulb associated with a 20% stenosis by the NASCET criteria.  Patient and his wife met with Dr. Estanislado Pandy in consultation 10/25/17 to discuss findings and possible interventions.  Patient elects to proceed with embolization vs. Stent of the RICA aneurysm.  He presents for procedure today in his usual state of health.  He reports all of his symptoms from stroke in November have resolved and he denies any residual weakness, numbness, tingling, difficuly speaking, difficulty walking, imbalance.  He has no headaches or blurry vision.  No new neurologic symptoms or complaints.  He has been NPO and does not take blood thinners.   Past Medical History:  Diagnosis Date  . Anginal pain ([place])   . Arthritis    "in my back and neck" (19-Sep-2012)  . CAD, CABG X 09 Nov 2011. re-do CABG X1 09/19/12 12/04/2011  . Coronary artery disease   . DDD (degenerative disc disease), cervical   . Hypercholesterolemia   . Hypertension   . Malaria    "I've had it a couple times in the 1960's; almost died from it" (September 19, 2012)  . NSTEMI (non-ST elevated myocardial infarction) ([place]) 11/2011   anterior  . NSTEMI (non-ST elevated myocardial infarction) ([place]) 09/19/2012  . PTSD (post-traumatic stress  disorder)   . PTSD (post-traumatic stress disorder)   . S/P angioplasty with stent,  10/31/12 VG to OM that was new graft from 11/13 in setting of NSTEMI 05/05/2013  . Seizures ([place])    1 TIME  . Vertebro basilar insufficiency 10/03/2017    Past Surgical History:  Procedure Laterality Date  . BACK SURGERY    . CARDIAC CATHETERIZATION  11/2011  . CATARACT EXTRACTION W/ INTRAOCULAR LENS  IMPLANT, BILATERAL  1990's  . CORONARY ANGIOPLASTY  10/2012   stent placed SVG to OM   . CORONARY ARTERY BYPASS GRAFT  12/02/2011   Procedure: CORONARY ARTERY BYPASS GRAFTING (CABG);  Surgeon: Gaye Pollack, MD;  Location: [place];  Service: Open Heart Surgery;  Laterality: N/A;  . CORONARY ARTERY BYPASS GRAFT  09/19/2012   Procedure: REDO CORONARY ARTERY BYPASS GRAFTING (CABG);  Surgeon: Gaye Pollack, MD;  Location: [place];  Service: Open Heart Surgery;  Laterality: N/A;  . IR ANGIO INTRA EXTRACRAN SEL COM CAROTID INNOMINATE BILAT MOD SED  10/05/2017  . IR ANGIO VERTEBRAL SEL VERTEBRAL BILAT MOD SED  10/05/2017  . IR RADIOLOGIST EVAL & MGMT  10/24/2017  . LEFT HEART CATHETERIZATION WITH CORONARY ANGIOGRAM N/A 12/02/2011   Procedure: LEFT HEART CATHETERIZATION WITH CORONARY ANGIOGRAM;  Surgeon: Lorretta Harp, MD;  Location: [facility] CATH LAB;  Service: Cardiovascular;  Laterality: N/A;  . LEFT HEART CATHETERIZATION WITH CORONARY ANGIOGRAM N/A 08/30/2012   Procedure: LEFT HEART CATHETERIZATION WITH CORONARY ANGIOGRAM;  Surgeon: Troy Sine, MD;  Location: [facility] CATH LAB;  Service: Cardiovascular;  Laterality: N/A;  . LEFT HEART  CATHETERIZATION WITH CORONARY ANGIOGRAM N/A 09/11/2012   Procedure: LEFT HEART CATHETERIZATION WITH CORONARY ANGIOGRAM;  Surgeon: Troy Sine, MD;  Location: [facility] CATH LAB;  Service: Cardiovascular;  Laterality: N/A;  . LEFT HEART CATHETERIZATION WITH CORONARY ANGIOGRAM N/A 05/16/2013   Procedure: LEFT HEART CATHETERIZATION WITH CORONARY ANGIOGRAM;  Surgeon: Lorretta Harp, MD;  Location:  [facility] CATH LAB;  Service: Cardiovascular;  Laterality: N/A;  . LEFT HEART CATHETERIZATION WITH CORONARY/GRAFT ANGIOGRAM  08/30/2012   Procedure: LEFT HEART CATHETERIZATION WITH Beatrix Fetters;  Surgeon: Troy Sine, MD;  Location: [facility] CATH LAB;  Service: Cardiovascular;;  . LEFT HEART CATHETERIZATION WITH CORONARY/GRAFT ANGIOGRAM N/A 10/04/2012   Procedure: LEFT HEART CATHETERIZATION WITH Beatrix Fetters;  Surgeon: [place]ie Man, MD;  Location: [facility] CATH LAB;  Service: Cardiovascular;  Laterality: N/A;  . LEFT HEART CATHETERIZATION WITH CORONARY/GRAFT ANGIOGRAM N/A 10/17/2012   Procedure: LEFT HEART CATHETERIZATION WITH Beatrix Fetters;  Surgeon: [place]ie Man, MD;  Location: [facility] CATH LAB;  Service: Cardiovascular;  Laterality: N/A;  . LUMBAR DISC SURGERY  1980's?  . PERCUTANEOUS CORONARY STENT INTERVENTION (PCI-S) N/A 10/31/2012   Procedure: PERCUTANEOUS CORONARY STENT INTERVENTION (PCI-S);  Surgeon: Lorretta Harp, MD;  Location: [facility] CATH LAB;  Service: Cardiovascular;  Laterality: N/A;  . RADIOLOGY WITH ANESTHESIA N/A 12/03/2017   Procedure: embolization;  Surgeon: Luanne Bras, MD;  Location: [place];  Service: Radiology;  Laterality: N/A;  . TONSILLECTOMY     "as a kid" (08/29/2012)  . TRANSTHORACIC ECHOCARDIOGRAM  10/18/2012   LV EF 40%- 45%   . US ECHOCARDIOGRAPHY  02/29/2012    Allergies: Patient has no known allergies.  Medications: Prior to Admission medications   Medication Sig Start Date End Date Taking? Authorizing Provider  amLODipine (NORVASC) 5 MG tablet Take 1 tablet (5 mg total) by mouth daily. 10/25/12  Yes Erlene Quan, PA-C  aspirin 325 MG EC tablet Take 1 tablet (325 mg total) by mouth daily. 10/06/17  Yes Hall, Carole N, DO  atorvastatin (LIPITOR) 80 MG tablet Take 1 tablet (80 mg total) by mouth daily. 10/10/17  Yes Lorretta Harp, MD  carvedilol (COREG) 3.125 MG tablet Take 3.125 mg by mouth 2 (two) times daily with a meal.   Yes  [provider]  clopidogrel (PLAVIX) 75 MG tablet Take 1 tablet (75 mg total) by mouth daily. Patient taking differently: Take 75 mg by mouth 2 (two) times daily.  10/06/17  Yes Irene Pap N, DO  ferrous sulfate 325 (65 FE) MG tablet Take 1 tablet (325 mg total) by mouth 3 (three) times daily with meals. Patient taking differently: Take 325 mg by mouth 2 (two) times daily with a meal.  10/25/12  Yes Kilroy, Luke K, PA-C  finasteride (PROSCAR) 5 MG tablet Take 5 mg by mouth daily.   Yes [provider]  hydrochlorothiazide (HYDRODIURIL) 25 MG tablet Take 25 mg by mouth daily.   Yes [provider]  ibuprofen (ADVIL,MOTRIN) 200 MG tablet Take 400 mg by mouth every 6 (six) hours as needed for mild pain.   Yes [provider]  isosorbide mononitrate (IMDUR) 30 MG 24 hr tablet Take 1 tablet (30 mg total) by mouth daily. 04/23/13  Yes Croitoru, Mihai, MD  nicotine (NICODERM CQ - DOSED IN MG/24 HOURS) 21 mg/24hr patch Place 21 mg onto the skin daily.   Yes [provider]  omeprazole (PRILOSEC) 20 MG capsule Take 20 mg by mouth 2 (two) times daily.   Yes [provider]  potassium chloride SA (K-DUR,KLOR-CON) 20 MEQ tablet Take 20 mEq by mouth 2 (two) times daily.   Yes [provider]  terazosin (HYTRIN) 10 MG capsule Take 10 mg by mouth at bedtime.   Yes [provider]  lidocaine (LIDODERM) 5 % Place 1 patch onto the skin daily as needed (for pain). Remove & Discard patch within 12 hours or as directed by MD     [provider]  nitroGLYCERIN (NITROSTAT) 0.4 MG SL tablet Place 1 tablet (0.4 mg total) under the tongue every 5 (five) minutes x 3 doses as needed for chest pain. 10/07/12   Brett Canales, PA-C     Family History  Problem Relation Age of Onset  . Heart disease Mother   . Diabetes Mother   . Heart disease Father   . Diabetes Father   . Healthy Sister   . Diabetes Brother   . Diabetes Sister   . Diabetes  Brother     Social History   Socioeconomic History  . Marital status: Married    Spouse name: Not on file  . Number of children: Not on file  . Years of education: Not on file  . Highest education level: Not on file  Occupational History  . Not on file  Social Needs  . Financial resource strain: Not on file  . Food insecurity:    Worry: Not on file    Inability: Not on file  . Transportation needs:    Medical: Not on file    Non-medical: Not on file  Tobacco Use  . Smoking status: Former Smoker    Packs/day: 0.50    Years: 50.00    Pack years: 25.00    Types: Cigarettes    Last attempt to quit: 12/02/2011    Years since quitting: 6.1  . Smokeless tobacco: Never Used  . Tobacco comment: NIC PATCH  Substance and Sexual Activity  . Alcohol use: Yes    Comment: RARE  . Drug use: No  . Sexual activity: Yes  Lifestyle  . Physical activity:    Days per week: Not on file    Minutes per session: Not on file  . Stress: Not on file  Relationships  . Social connections:    Talks on phone: Not on file    Gets together: Not on file    Attends religious service: Not on file    Active member of club or organization: Not on file    Attends meetings of clubs or organizations: Not on file    Relationship status: Not on file  Other Topics Concern  . Not on file  Social History Narrative   [place] [phone]. History of Agent Orange exposure, followed at [place] of Systems: A 12 point ROS discussed and pertinent positives are indicated in the HPI above.  All other systems are negative.  Review of Systems  Constitutional: Negative for fatigue and fever.  Respiratory: Negative for cough and shortness of breath.   Cardiovascular: Negative for chest pain.  Gastrointestinal: Negative for abdominal pain.  Neurological: Negative for speech difficulty, weakness, numbness and headaches.  Psychiatric/Behavioral: Negative for behavioral problems and confusion.    Vital  Signs: BP 132/62   Pulse (!) 50   Temp (!) 97.4 F (36.3 C) (Oral)   Resp 18   SpO2 100%   Physical Exam  Constitutional: He is oriented to person, place, and time. He appears well-developed.  Cardiovascular: Regular rhythm and  normal heart sounds.  bradycardia  Pulmonary/Chest: Effort normal and breath sounds normal.  Abdominal: Soft. Bowel sounds are normal.  Neurological: He is alert and oriented to person, place, and time.  Skin: Skin is warm and dry.  Psychiatric: He has a normal mood and affect. His behavior is normal. Judgment and thought content normal.  Nursing note and vitals reviewed.      Imaging: No results found.  Labs:  CBC: Recent Labs    10/04/17 0426 10/05/17 0415 11/30/17 1022 01/28/18 0933  WBC 8.1 11.8* 7.6 7.1  HGB 13.1 14.0 14.6 14.8  HCT 39.5 40.9 42.3 43.7  PLT 147* 144* 179 164    COAGS: Recent Labs    10/03/17 2017 11/30/17 1022 01/28/18 0933  INR 1.02 1.00 0.99  APTT '31 30 30    '$ BMP: Recent Labs    10/03/17 2017 10/03/17 2040 10/04/17 0426 10/05/17 0415 11/30/17 1022 01/28/18 0933  NA 138 143  --  139 137 139  K 3.3* 3.6  --  3.7 3.7 3.6  CL 105 104  --  108 104 104  CO2 25  --   --  '26 23 25  '$ GLUCOSE 87 88  --  92 98 96  BUN 23* 24*  --  17 24* 21*  CALCIUM 9.0  --   --  8.7* 9.0 9.0  CREATININE 0.94 0.80 0.85 0.85 0.82 1.01  GFRNONAA >60  --  >60 >60 >60 >60  GFRAA >60  --  >60 >60 >60 >60    LIVER FUNCTION TESTS: Recent Labs    10/03/17 2017 11/30/17 1022 01/28/18 0933  BILITOT 0.2* 0.7 0.6  AST '20 24 29  '$ ALT 20 29 37  ALKPHOS 68 83 89  PROT 6.5 6.7 6.9  ALBUMIN 3.6 3.8 3.7    TUMOR MARKERS: No results for input(s): AFPTM, CEA, CA199, CHROMGRNA in the last 8760 hours.  Assessment and Plan: Patient with past medical history of CVA in 09/2017 presents with complaint of right internal carotid aneurysm identified by angiogram.  Patient has met in clinic with Dr. Estanislado Pandy to discuss treatment options.  Patient elects to proceed.  Patient presents today in their usual state of health.  He has been NPO and is not currently on blood thinners.  He did not take his Plavix or aspirin this AM. Will give 150 mg Plavix and aspirin '325mg'$ .   Risks and benefits discussed with the patient including, but not limited to bleeding, infection, vascular injury or contrast induced renal failure.  This interventional procedure involves the use of X-rays and because of the nature of the planned procedure, it is possible that we will have prolonged use of X-ray fluoroscopy.  Potential radiation risks to you include (but are not limited to) the following: - A slightly elevated risk for cancer  several years later in life. This risk is typically less than 0.5% percent. This risk is low in comparison to the normal incidence of human cancer, which is 33% for women and 50% for men according to the [place]. - Radiation induced injury can include skin redness, resembling a rash, tissue breakdown / ulcers and hair loss (which can be temporary or permanent).   The likelihood of either of these occurring depends on the difficulty of the procedure and whether you are sensitive to radiation due to previous procedures, disease, or genetic conditions.   IF your procedure requires a prolonged use of radiation, you will be notified and given written instructions for  further action.  It is your responsibility to monitor the irradiated area for the 2 weeks following the procedure and to notify your physician if you are concerned that you have suffered a radiation induced injury.    All of the patient's questions were answered, patient is agreeable to proceed.  Consent signed and in chart.  Thank you for this interesting consult.  I greatly enjoyed meeting Shinichi Anguiano. and look forward to participating in their care.  A copy of this report was sent to the requesting provider on this date.  Electronically  Signed: Docia Barrier, PA 01/30/2018, 8:09 AM   I spent a total of    25 Minutes in face to face in clinical consultation, greater than 50% of which was counseling/coordinating care for RICA aneurysm.

## 2018-01-30 NOTE — Progress Notes (Addendum)
Referring Physician(s): None  Supervising Physician: Luanne Bras  Patient Status:  [name] - In-pt  Chief Complaint: Right ICA aneurysm/pseudoaneurysm s/p revascularization  Subjective:  Right ICA aneurysm/pseudoaneurysm s/p revascularization 01/30/2018. Patient alert laying in bed. Denies headache, weakness, numbness/tingling, vision problems, or speech difficulty. Left groin incision c/d/i.  Allergies: Patient has no known allergies.  Medications: Prior to Admission medications   Medication Sig Start Date End Date Taking? Authorizing Provider  amLODipine (NORVASC) 5 MG tablet Take 1 tablet (5 mg total) by mouth daily. 10/25/12  Yes Erlene Quan, PA-C  aspirin 325 MG EC tablet Take 1 tablet (325 mg total) by mouth daily. 10/06/17  Yes Hall, Carole N, DO  atorvastatin (LIPITOR) 80 MG tablet Take 1 tablet (80 mg total) by mouth daily. 10/10/17  Yes Lorretta Harp, MD  carvedilol (COREG) 3.125 MG tablet Take 3.125 mg by mouth 2 (two) times daily with a meal.   Yes [provider]  clopidogrel (PLAVIX) 75 MG tablet Take 1 tablet (75 mg total) by mouth daily. Patient taking differently: Take 75 mg by mouth 2 (two) times daily.  10/06/17  Yes Irene Pap N, DO  ferrous sulfate 325 (65 FE) MG tablet Take 1 tablet (325 mg total) by mouth 3 (three) times daily with meals. Patient taking differently: Take 325 mg by mouth 2 (two) times daily with a meal.  10/25/12  Yes Kilroy, Luke K, PA-C  finasteride (PROSCAR) 5 MG tablet Take 5 mg by mouth daily.   Yes [provider]  hydrochlorothiazide (HYDRODIURIL) 25 MG tablet Take 25 mg by mouth daily.   Yes [provider]  ibuprofen (ADVIL,MOTRIN) 200 MG tablet Take 400 mg by mouth every 6 (six) hours as needed for mild pain.   Yes [provider]  isosorbide mononitrate (IMDUR) 30 MG 24 hr tablet Take 1 tablet (30 mg total) by mouth daily. 04/23/13  Yes Croitoru, Mihai, MD  nicotine (NICODERM CQ - DOSED  IN MG/24 HOURS) 21 mg/24hr patch Place 21 mg onto the skin daily.   Yes [provider]  omeprazole (PRILOSEC) 20 MG capsule Take 20 mg by mouth 2 (two) times daily.   Yes [provider]  potassium chloride SA (K-DUR,KLOR-CON) 20 MEQ tablet Take 20 mEq by mouth 2 (two) times daily.   Yes [provider]  terazosin (HYTRIN) 10 MG capsule Take 10 mg by mouth at bedtime.   Yes [provider]  lidocaine (LIDODERM) 5 % Place 1 patch onto the skin daily as needed (for pain). Remove & Discard patch within 12 hours or as directed by MD     [provider]  nitroGLYCERIN (NITROSTAT) 0.4 MG SL tablet Place 1 tablet (0.4 mg total) under the tongue every 5 (five) minutes x 3 doses as needed for chest pain. 10/07/12   Brett Canales, PA-C     Vital Signs: BP 135/62   Pulse (!) 52   Temp 98.3 F (36.8 C) (Oral)   Resp 20   Ht 5\' 10"  (1.778 m)   Wt 184 lb 4.9 oz (83.6 kg)   SpO2 96%   BMI 26.44 kg/m   Physical Exam  Constitutional: He is oriented to person, place, and time. He appears well-developed and well-nourished. No distress.  Cardiovascular: Normal rate, regular rhythm and normal heart sounds.  No murmur heard. Pulmonary/Chest: Effort normal and breath sounds normal. He has no wheezes.  Neurological: He is alert and oriented to person, place, and time.  No weakness. Speech clear. PERRL. EOM intact bilaterally. No facial asymmetry. Tongue midline. No pronator drift. Gait not assessed. Motor power symmetric proportional to effort. Fine motor movements symmetric. Distal pulses 2+ bilaterally.  Skin: Skin is warm and dry.  Left groin incision soft without hematoma or active bleeding.  Psychiatric: He has a normal mood and affect. His behavior is normal. Judgment and thought content normal.  Nursing note and vitals reviewed.   Imaging: No results found.  Labs:  CBC: Recent Labs    10/04/17 0426 10/05/17 0415 11/30/17 1022  01/28/18 0933  WBC 8.1 11.8* 7.6 7.1  HGB 13.1 14.0 14.6 14.8  HCT 39.5 40.9 42.3 43.7  PLT 147* 144* 179 164    COAGS: Recent Labs    10/03/17 2017 11/30/17 1022 01/28/18 0933  INR 1.02 1.00 0.99  APTT 31 30 30     BMP: Recent Labs    10/03/17 2017 10/03/17 2040 10/04/17 0426 10/05/17 0415 11/30/17 1022 01/28/18 0933  NA 138 143  --  139 137 139  K 3.3* 3.6  --  3.7 3.7 3.6  CL 105 104  --  108 104 104  CO2 25  --   --  26 23 25   GLUCOSE 87 88  --  92 98 96  BUN 23* 24*  --  17 24* 21*  CALCIUM 9.0  --   --  8.7* 9.0 9.0  CREATININE 0.94 0.80 0.85 0.85 0.82 1.01  GFRNONAA >60  --  >60 >60 >60 >60  GFRAA >60  --  >60 >60 >60 >60    LIVER FUNCTION TESTS: Recent Labs    10/03/17 2017 11/30/17 1022 01/28/18 0933  BILITOT 0.2* 0.7 0.6  AST 20 24 29   ALT 20 29 37  ALKPHOS 68 83 89  PROT 6.5 6.7 6.9  ALBUMIN 3.6 3.8 3.7    Assessment and Plan:  Right ICA aneurysm/pseudoaneurysm s/p revascularization 01/30/2018. Patient recovering well. Continue neuro and BP checks. Groin stable. Continue Plavix 75mg  once a day and Aspirin 325mg  once a day.  Electronically Signed: Earley Abide, PA-C 01/30/2018, 4:34 PM   I spent a total of 15 Minutes at the the patient's bedside AND on the patient's hospital floor or unit, greater than 50% of which was counseling/coordinating care for right ICA aneurysm/pseudoaneurysm s/p revascularization.

## 2018-01-30 NOTE — Progress Notes (Signed)
ANTICOAGULATION CONSULT NOTE - Initial Consult  Pharmacy Consult for heparin Indication: post revascularization  No Known Allergies  Patient Measurements: Height: 5\' 10"  (177.8 cm) Weight: 184 lb 4.9 oz (83.6 kg) IBW/kg (Calculated) : 73 Heparin Dosing Weight: 83.6 kg  Vital Signs: Temp: 98.1 F (36.7 C) (03/27 2000) Temp Source: Oral (03/27 2000) BP: 129/51 (03/27 2245) Pulse Rate: 43 (03/27 2245)  Labs: Recent Labs    01/28/18 0933 01/30/18 2207  HGB 14.8 12.9*  HCT 43.7 38.0*  PLT 164 132*  APTT 30  --   LABPROT 13.0  --   INR 0.99  --   HEPARINUNFRC  --  <0.10*  CREATININE 1.01  --     Estimated Creatinine Clearance: 70.3 mL/min (by C-G formula based on SCr of 1.01 mg/dL).   Medical History: Past Medical History:  Diagnosis Date  . Anginal pain ([place])   . Arthritis    "in my back and neck" (2012-09-02)  . CAD, CABG X 09 Nov 2011. re-do CABG X1 09/19/12 12/04/2011  . Coronary artery disease   . DDD (degenerative disc disease), cervical   . Hypercholesterolemia   . Hypertension   . Malaria    "I've had it a couple times in the 1960's; almost died from it" (Sep 02, 2012)  . NSTEMI (non-ST elevated myocardial infarction) ([place]) 11/2011   anterior  . NSTEMI (non-ST elevated myocardial infarction) ([place]) 09/02/12  . PTSD (post-traumatic stress disorder)   . PTSD (post-traumatic stress disorder)   . S/P angioplasty with stent,  10/31/12 VG to OM that was new graft from 11/13 in setting of NSTEMI 05/05/2013  . Seizures ([place])    1 TIME  . Vertebro basilar insufficiency 10/03/2017    Assessment: 72 yo man on heparin drip s/p revascularization procedure.  Initial heparin level is <0.1 units/ml Goal of Therapy:  Heparin level 0.1-0.25 units/ml Monitor platelets by anticoagulation protocol: Yes   Plan:  Increase heparin to 850 units/hr Heparin will be dced at 0700  Excell Seltzer [place] 01/30/2018,11:12 PM

## 2018-01-30 NOTE — Anesthesia Preprocedure Evaluation (Addendum)
Anesthesia Evaluation  Patient identified by MRN, date of birth, ID band Patient awake    Reviewed: Allergy & Precautions, NPO status , Patient's Chart, lab work & pertinent test results, reviewed documented beta blocker date and time   Airway Mallampati: II  TM Distance: >3 FB Neck ROM: Full    Dental  (+) Edentulous Upper, Edentulous Lower   Pulmonary former smoker,    Pulmonary exam normal breath sounds clear to auscultation       Cardiovascular hypertension, Pt. on medications and Pt. on home beta blockers + CAD, + Past MI, + Cardiac Stents, + CABG and + Peripheral Vascular Disease  Normal cardiovascular exam Rhythm:Regular Rate:Normal  '18 TTE -  Mild LVH. EF 60% to 65%. Grade 2 diastolic dysfunction. Mild MR. Mildly dilated atria b/l.   '14 Cath - 1. Left main; TOTALLY OCCLUDED AT THE ORIGIN 2. LAD; FILLS BY RETROGRADE FLOW FROM THE ima GRAFT 3. Left circumflex; FILLS BY RETROGRADE FLOW FROM THE lima GRAFT AND CIRCUMFLEX OBTUSE MARGINAL GRAFT.  4. Right coronary artery; patent and dominant competitive flow from the RCA vein graft 5.LIMA TO LAD; widely patent 6. SVG TO PDA and PLA sequentially was widely patent SVG TO 80% ostial stenosis and 95% distal in-stent restenosis within the SVG to the OM branch 7. Left ventriculography; RAO left ventriculogram was performed using  25 mL of Visipaque dye at 12 mL/second. The overall LVEF estimated 50-55 % Without wall motion abnormalities PCI: Successful PCI and stenting (2.75 mm x 15 mm long Xpedition drug-eluting stent) of the ostium as well as distal portion of the SVG to the OM using drug-eluting stent (2.75 mm x 15 mm long Xpedition drug-eluting stent), Angiomax, and Effient.    Neuro/Psych Anxiety Vertebrobasilar insufficiency, vertebral artery dissection    GI/Hepatic Neg liver ROS, GERD  Controlled and Medicated,  Endo/Other  negative endocrine ROS  Renal/GU negative  Renal ROS     Musculoskeletal  (+) Arthritis ,   Abdominal   Peds  Hematology negative hematology ROS (+)   Anesthesia Other Findings   Reproductive/Obstetrics                            Lab Results  Component Value Date   WBC 7.1 01/28/2018   HGB 14.8 01/28/2018   HCT 43.7 01/28/2018   MCV 95.4 01/28/2018   PLT 164 01/28/2018   Lab Results  Component Value Date   CREATININE 1.01 01/28/2018   BUN 21 (H) 01/28/2018   NA 139 01/28/2018   K 3.6 01/28/2018   CL 104 01/28/2018   CO2 25 01/28/2018    Anesthesia Physical  Anesthesia Plan  ASA: III  Anesthesia Plan: MAC   Post-op Pain Management:    Induction: Intravenous  PONV Risk Score and Plan: Treatment may vary due to age or medical condition, Ondansetron and Dexamethasone  Airway Management Planned: Natural Airway and Simple Face Mask  Additional Equipment: Arterial line  Intra-op Plan:   Post-operative Plan: Extubation in OR  Informed Consent: I have reviewed the patients History and Physical, chart, labs and discussed the procedure including the risks, benefits and alternatives for the proposed anesthesia with the patient or authorized representative who has indicated his/her understanding and acceptance.   Dental advisory given  Plan Discussed with: CRNA and Anesthesiologist  Anesthesia Plan Comments:        Anesthesia Quick Evaluation

## 2018-01-30 NOTE — Anesthesia Procedure Notes (Signed)
Arterial Line Insertion Start/End3/27/2019 8:30 AM Performed by: Lieutenant Diego, CRNA, CRNA  Patient location: Pre-op. Preanesthetic checklist: pre-op evaluation and timeout performed Lidocaine 1% used for infiltration Right, radial was placed Catheter size: 20 G Hand hygiene performed  and maximum sterile barriers used   Attempts: 1 Procedure performed without using ultrasound guided technique. Following insertion, Biopatch and dressing applied. Post procedure assessment: unchanged  Patient tolerated the procedure well with no immediate complications.

## 2018-01-30 NOTE — Sedation Documentation (Signed)
Manual pressure started to left groin by IR Tech at this time. Patient A&Ox4. NAD noted.

## 2018-01-30 NOTE — Sedation Documentation (Signed)
Manual pressure being applied to left groin by IR Tech.

## 2018-01-30 NOTE — Sedation Documentation (Signed)
Report given to [place], RN in PACU. Site checked and pulses checked again with nurse.

## 2018-01-30 NOTE — Sedation Documentation (Signed)
Dressing applied to left groin. Bedrest to start at this time x4 hours as verbal orders obtained from Dr. Estanislado Pandy.

## 2018-01-31 ENCOUNTER — Encounter (HOSPITAL_COMMUNITY): Payer: Self-pay | Admitting: Interventional Radiology

## 2018-01-31 ENCOUNTER — Other Ambulatory Visit (HOSPITAL_COMMUNITY): Payer: Self-pay | Admitting: Interventional Radiology

## 2018-01-31 DIAGNOSIS — I671 Cerebral aneurysm, nonruptured: Secondary | ICD-10-CM

## 2018-01-31 LAB — BASIC METABOLIC PANEL
Anion gap: 8 (ref 5–15)
BUN: 13 mg/dL (ref 6–20)
CALCIUM: 8.1 mg/dL — AB (ref 8.9–10.3)
CO2: 22 mmol/L (ref 22–32)
CREATININE: 0.6 mg/dL — AB (ref 0.61–1.24)
Chloride: 109 mmol/L (ref 101–111)
GFR calc Af Amer: >60 mL/min (ref >60)
GFR calc non Af Amer: >60 mL/min (ref >60)
GLUCOSE: 96 mg/dL (ref 65–99)
Potassium: 3.2 mmol/L — ABNORMAL LOW (ref 3.5–5.1)
Sodium: 139 mmol/L (ref 135–145)

## 2018-01-31 LAB — CBC WITH DIFFERENTIAL/PLATELET
BASOS PCT: 0 %
Basophils Absolute: 0 10*3/uL (ref 0.0–0.1)
EOS ABS: 0.1 10*3/uL (ref 0.0–0.7)
EOS PCT: 1 %
HCT: 38.7 % — ABNORMAL LOW (ref 39.0–52.0)
Hemoglobin: 13.1 g/dL (ref 13.0–17.0)
LYMPHS ABS: 2.1 10*3/uL (ref 0.7–4.0)
Lymphocytes Relative: 27 %
MCH: 32.3 pg (ref 26.0–34.0)
MCHC: 33.9 g/dL (ref 30.0–36.0)
MCV: 95.3 fL (ref 78.0–100.0)
MONO ABS: 0.5 10*3/uL (ref 0.1–1.0)
MONOS PCT: 7 %
Neutro Abs: 5.1 10*3/uL (ref 1.7–7.7)
Neutrophils Relative %: 65 %
PLATELETS: 137 10*3/uL — AB (ref 150–400)
RBC: 4.06 MIL/uL — ABNORMAL LOW (ref 4.22–5.81)
RDW: 14 % (ref 11.5–15.5)
WBC: 7.7 10*3/uL (ref 4.0–10.5)

## 2018-01-31 LAB — HEPARIN LEVEL (UNFRACTIONATED)

## 2018-01-31 LAB — PLATELET INHIBITION P2Y12: PLATELET FUNCTION P2Y12: 164 [PRU] — AB (ref 194–418)

## 2018-01-31 MED ORDER — CLOPIDOGREL BISULFATE 75 MG PO TABS: 75.0000 mg | ORAL_TABLET | Freq: Every day | ORAL | 0 refills | Status: AC

## 2018-01-31 NOTE — Progress Notes (Signed)
Patient [place] home via car with family.  DC instructions given and fully understood by patient.  Vital signs and assessments were stable.

## 2018-01-31 NOTE — Discharge Instructions (Signed)
Cerebral Angiogram, Care After °Refer to this sheet in the next few weeks. These instructions provide you with information on caring for yourself after your procedure. Your health care provider may also give you more specific instructions. Your treatment has been planned according to current medical practices, but problems sometimes occur. Call your health care provider if you have any problems or questions after your procedure. °What can I expect after the procedure? °After your procedure, it is typical to have the following: °· Bruising at the catheter insertion site that usually fades within 1-2 weeks. °· Blood collecting in the tissue (hematoma) that may be painful to the touch. It should usually decrease in size and tenderness within 1-2 weeks. °· A mild headache. ° °Follow these instructions at home: °· Take medicines only as directed by your health care provider. °· You may shower 24-48 hours after the procedure or as directed by your health care provider. Remove the bandage (dressing) and gently wash the site with plain soap and water. Pat the area dry with a clean towel. Do not rub the site, because this may cause bleeding. °· Do not take baths, swim, or use a hot tub until your health care provider approves. °· Check your insertion site every day for redness, swelling, or drainage. °· Do not apply powder or lotion to the site. °· Do not lift over 10 lb (4.5 kg) for 5 days after your procedure or as directed by your health care provider. °· Ask your health care provider when it is okay to: °? Return to work or school. °? Resume usual physical activities or sports. °? Resume sexual activity. °· Do not drive home if you are discharged the same day as the procedure. Have someone else drive you. °· You may drive 24 hours after the procedure unless otherwise instructed by your health care provider. °· Do not operate machinery or power tools for 24 hours after the procedure or as directed by your health care  provider. °· If your procedure was done as an outpatient procedure, which means that you went home the same day as your procedure, a responsible adult should be with you for the first 24 hours after you arrive home. °· Keep all follow-up visits as directed by your health care provider. This is important. °Contact a health care provider if: °· You have a fever. °· You have chills. °· You have increased bleeding from the catheter insertion site. Hold pressure on the site. °Get help right away if: °· You have vision changes or loss of vision. °· You have numbness or weakness on one side of your body. °· You have difficulty talking, or you have slurred speech or cannot speak (aphasia). °· You feel confused or have difficulty remembering. °· You have unusual pain at the catheter insertion site. °· You have redness, warmth, or swelling at the catheter insertion site. °· You have drainage (other than a small amount of blood on the dressing) from the catheter insertion site. °· The catheter insertion site is bleeding, and the bleeding does not stop after 30 minutes of holding steady pressure on the site. °These symptoms may represent a serious problem that is an emergency. Do not wait to see if the symptoms will go away. Get medical help right away. Call your local emergency services (911 in [place] Do not drive yourself to the hospital. °This information is not intended to replace advice given to you by your health care provider. Make sure you discuss any questions   you have with your health care provider. °Document Released: 03/09/2014 Document Revised: 03/30/2016 Document Reviewed: 11/05/2013 °Elsevier Interactive Patient Education © 2017 [employer] ° °

## 2018-01-31 NOTE — Progress Notes (Signed)
Patient ID: [name]., male   DOB: 07/18/1946, 72 y.o.   MRN: [id number] INR.  Post procedure day 1. S/P prox. RT ICA stent placement for irregular lobulated side wall aneurysm/pseudoaneurysm. No sig neurological  or  systemic complaints..Mild RT sided H/A,Denies N/V,visual ,motor or sensory or coordination difficulties. Tolerating reg food without difficulty. Denies any chest pains ,SOB or abdominal pain. No chills,fever or rigors. On examination. VS BP 130s/70s. HR 60 to 70 SR .PAO2  >95 % RA  Neurological  Exam.  Alert, awake oriented to time ,place and space.Marland Kitchen  Speech and comprehension clear.Marland Kitchen  PEARLA..66mm RT = Lt   EOMS full..No nystagmus or subjective diplopia    Visual Fields.Grossly intact.  No Facial asymmetry.  Tongue Midline..  Motor..           NO drift of outstretched arms..          Power.5/5 Proximally and distally all four extremities..  Fine motor and coordination to finger to nose equal bilaterally.  Gait . Not tested  Romberg. Not tested  Heel to toe.Not tested   Lt groin soft. No hematoma/bleeding. Pulses. DPs and PT palpable bilaterally.  Plan. 1.D/C IVs, Art line ,condom catheter. 2.D/C IV heparin .and IV cardene. 3.Restart home meds ,plus aspirin 325 mg and plavix 75 mg / day. 4.Ambulate with assistance. 6.Increase PO intake. 7.If tolerated D/C to home. D/W patient to maintain hydration with H2O,no stooping or bending or lifting weights above 10 lbs for 2 weeks. Avoid driving for 2 weeks. RTC to clinic in 2 to 3 weeks. Check P2 Y12 before patients leaves.

## 2018-01-31 NOTE — Discharge Summary (Signed)
Patient ID: [name]. MRN: [id number] DOB/AGE: [birth year] 72 y.o.  Admit date: 01/30/2018 Discharge date: 01/31/2018  Supervising Physician: Luanne Bras  Patient Status: [facility]  Admission Diagnoses: Proximal right ICA aneurysm  Discharge Diagnoses:  Active Problems:   Internal carotid aneurysm   Discharged Condition: good  Hospital Course: Admitted 3/27 post prox. RT ICA stent placement for irregular lobulated side wall aneurysm/pseudoaneurysm. Stable after procedure, no immediate complications. POD #1, no overnight issues. Neurologically stable, no events. Seen by Dr. Estanislado Pandy, stable for discharge. Repeat P2Y12 was 164. All medications, instructions and follow up plans reviewed.   Discharge Exam: Blood pressure (!) 137/53, pulse (!) 45, temperature 98.5 F (36.9 C), temperature source Oral, resp. rate 17, height 5\' 10"  (1.778 m), weight 184 lb 4.9 oz (83.6 kg), SpO2 96 %. Neurological  Exam.  Alert, awake oriented to time ,place and space.Marland Kitchen  Speech and comprehension clear.Marland Kitchen  PEARLA..26mm RT = Lt   EOMS full..No nystagmus or subjective diplopia    Visual Fields.Grossly intact.  No Facial asymmetry.  Tongue Midline..  Motor..           NO drift of outstretched arms..          Power.5/5 Proximally and distally all four extremities..  Fine motor and coordination to finger to nose equal bilaterally.  Gait . Not tested  Romberg. Not tested  Heel to toe.Not tested   Lt groin soft. No hematoma/bleeding. Pulses. DPs and PT palpable bilaterally.    Disposition: Discharge disposition: 01-Home or Self Care       Discharge Instructions    Call MD for:  difficulty breathing, headache or visual disturbances   Complete by:  As directed    Call MD for:  persistant nausea and vomiting   Complete by:  As directed    Call MD for:  redness, tenderness, or signs of infection (pain, swelling, redness, odor or green/yellow  discharge around incision site)   Complete by:  As directed    Call MD for:  severe uncontrolled pain   Complete by:  As directed    Call MD for:  temperature >100.4   Complete by:  As directed    Diet - low sodium heart healthy   Complete by:  As directed    Driving Restrictions   Complete by:  As directed    Avoid driving for 1 week   Increase activity slowly   Complete by:  As directed    May shower / Bathe   Complete by:  As directed    May walk up steps   Complete by:  As directed    Remove dressing in 24 hours   Complete by:  As directed      Allergies as of 01/31/2018   No Known Allergies     Medication List    TAKE these medications   amLODipine 5 MG tablet Commonly known as:  NORVASC Take 1 tablet (5 mg total) by mouth daily.   aspirin 325 MG EC tablet Take 1 tablet (325 mg total) by mouth daily.   atorvastatin 80 MG tablet Commonly known as:  LIPITOR Take 1 tablet (80 mg total) by mouth daily.   carvedilol 3.125 MG tablet Commonly known as:  COREG Take 3.125 mg by mouth 2 (two) times daily with a meal.   clopidogrel 75 MG tablet Commonly known as:  PLAVIX Take 1 tablet (75 mg total) by mouth daily. What changed:  when to take this  ferrous sulfate 325 (65 FE) MG tablet Take 1 tablet (325 mg total) by mouth 3 (three) times daily with meals. What changed:  when to take this   finasteride 5 MG tablet Commonly known as:  PROSCAR Take 5 mg by mouth daily.   hydrochlorothiazide 25 MG tablet Commonly known as:  HYDRODIURIL Take 25 mg by mouth daily.   ibuprofen 200 MG tablet Commonly known as:  ADVIL,MOTRIN Take 400 mg by mouth every 6 (six) hours as needed for mild pain.   isosorbide mononitrate 30 MG 24 hr tablet Commonly known as:  IMDUR Take 1 tablet (30 mg total) by mouth daily.   lidocaine 5 % Commonly known as:  LIDODERM Place 1 patch onto the skin daily as needed (for pain). Remove & Discard patch within 12 hours or as directed by MD     nicotine 21 mg/24hr patch Commonly known as:  NICODERM CQ - dosed in mg/24 hours Place 21 mg onto the skin daily.   nitroGLYCERIN 0.4 MG SL tablet Commonly known as:  NITROSTAT Place 1 tablet (0.4 mg total) under the tongue every 5 (five) minutes x 3 doses as needed for chest pain.   omeprazole 20 MG capsule Commonly known as:  PRILOSEC Take 20 mg by mouth 2 (two) times daily.   potassium chloride SA 20 MEQ tablet Commonly known as:  K-DUR,KLOR-CON Take 20 mEq by mouth 2 (two) times daily.   terazosin 10 MG capsule Commonly known as:  HYTRIN Take 10 mg by mouth at bedtime.      Follow-up Information    Luanne Bras, MD. Go in 2 week(s).   Specialties:  Interventional Radiology, Radiology Why:  Office will call you to schedule follow up appointment Contact information: [address] [place] [address] [phone]            Electronically Signed: Ascencion Dike, PA-C 01/31/2018, 10:12 AM   I have spent Less Than 30 Minutes discharging [name].Marland Kitchen

## 2018-02-01 ENCOUNTER — Encounter (HOSPITAL_COMMUNITY): Payer: Self-pay | Admitting: Interventional Radiology

## 2018-02-20 ENCOUNTER — Ambulatory Visit (HOSPITAL_COMMUNITY)
Admission: RE | Admit: 2018-02-20 | Discharge: 2018-02-20 | Disposition: A | Discharge status: Home / Self Care | Payer: Medicare Other | Source: Ambulatory Visit | Attending: Interventional Radiology | Admitting: Interventional Radiology

## 2018-02-20 DIAGNOSIS — I671 Cerebral aneurysm, nonruptured: Secondary | ICD-10-CM

## 2018-02-20 NOTE — Consult Note (Signed)
Chief Complaint: Patient was seen in consultation today for right proximal ICA aneurysm s/p embolization.  Supervising Physician: Luanne Bras  Patient Status: [facility] - Out-pt  History of Present Illness: Darrell Golla. is a 72 y.o. male  Hx vertebrobasilar insufficiency 09/2017, CAD, CABG x3 2013 with redo CABG x1 2013, MI, hypertension, and hypercholesterolemia.  Known to NIR, followed by Dr. Estanislado Pandy.  10/05/2017: cerebral angiogram with Dr. Estanislado Pandy: 1. Angiographically occluded left vertebral artery at the level of C1. 2. Stump of the occluded left vertebrobasilar junction just proximal to the confluence to form the basilar artery. 3. Approximately 7.4 mm x 6.1 mm irregular saccular aneurysm involving the proximal right internal carotid artery just distal to the bulb associated with a 20% stenosis by the NASCET criteria. 4. Mild atherosclerotic ulcerative disease involving the left internal carotid artery at the bulb.  10/24/2017: consulted with Dr. Estanislado Pandy for management of proximal right ICA aneurysm.  01/30/2018: cerebral angiogram with embolization of right proximal ICA aneurysm embolization with Dr. Estanislado Pandy.  Patient presents today for follow-up regarding his recent right proximal ICA aneurysm embolization 01/30/2018. Patient states he has no complaints at this time. Denies headache, weakness, dizziness, numbness/tingling, vision changes, hearing changes, tinnitus, or speech difficulty.  Patient states he is taking Plavix 75 mg once daily and Aspirin 325 mg once daily.  Past Medical History:  Diagnosis Date  . Anginal pain ([place])   . Arthritis    "in my back and neck" (10-Sep-2012)  . CAD, CABG X 09 Nov 2011. re-do CABG X1 09/19/12 12/04/2011  . Coronary artery disease   . DDD (degenerative disc disease), cervical   . Hypercholesterolemia   . Hypertension   . Malaria    "I've had it a couple times in the 1960's; almost died from it" (09-10-12)  .  NSTEMI (non-ST elevated myocardial infarction) ([place]) 11/2011   anterior  . NSTEMI (non-ST elevated myocardial infarction) ([place]) 09/10/12  . PTSD (post-traumatic stress disorder)   . PTSD (post-traumatic stress disorder)   . S/P angioplasty with stent,  10/31/12 VG to OM that was new graft from 11/13 in setting of NSTEMI 05/05/2013  . Seizures ([place])    1 TIME  . Vertebro basilar insufficiency 10/03/2017    Past Surgical History:  Procedure Laterality Date  . BACK SURGERY    . CARDIAC CATHETERIZATION  11/2011  . CATARACT EXTRACTION W/ INTRAOCULAR LENS  IMPLANT, BILATERAL  1990's  . CORONARY ANGIOPLASTY  10/2012   stent placed SVG to OM   . CORONARY ARTERY BYPASS GRAFT  12/02/2011   Procedure: CORONARY ARTERY BYPASS GRAFTING (CABG);  Surgeon: Gaye Pollack, MD;  Location: [place];  Service: Open Heart Surgery;  Laterality: N/A;  . CORONARY ARTERY BYPASS GRAFT  09/19/2012   Procedure: REDO CORONARY ARTERY BYPASS GRAFTING (CABG);  Surgeon: Gaye Pollack, MD;  Location: [place];  Service: Open Heart Surgery;  Laterality: N/A;  . IR ANGIO INTRA EXTRACRAN SEL COM CAROTID INNOMINATE BILAT MOD SED  10/05/2017  . IR ANGIO VERTEBRAL SEL VERTEBRAL BILAT MOD SED  10/05/2017  . IR INTRAVSC STENT CERV CAROTID W/O EMB-PROT MOD SED INC ANGIO  01/30/2018  . IR RADIOLOGIST EVAL & MGMT  10/24/2017  . LEFT HEART CATHETERIZATION WITH CORONARY ANGIOGRAM N/A 12/02/2011   Procedure: LEFT HEART CATHETERIZATION WITH CORONARY ANGIOGRAM;  Surgeon: Lorretta Harp, MD;  Location: [facility] CATH LAB;  Service: Cardiovascular;  Laterality: N/A;  . LEFT HEART CATHETERIZATION WITH CORONARY ANGIOGRAM N/A 08/30/2012   Procedure:  LEFT HEART CATHETERIZATION WITH CORONARY ANGIOGRAM;  Surgeon: Troy Sine, MD;  Location: [facility] CATH LAB;  Service: Cardiovascular;  Laterality: N/A;  . LEFT HEART CATHETERIZATION WITH CORONARY ANGIOGRAM N/A 09/11/2012   Procedure: LEFT HEART CATHETERIZATION WITH CORONARY ANGIOGRAM;  Surgeon: Troy Sine,  MD;  Location: [facility] CATH LAB;  Service: Cardiovascular;  Laterality: N/A;  . LEFT HEART CATHETERIZATION WITH CORONARY ANGIOGRAM N/A 05/16/2013   Procedure: LEFT HEART CATHETERIZATION WITH CORONARY ANGIOGRAM;  Surgeon: Lorretta Harp, MD;  Location: [facility] CATH LAB;  Service: Cardiovascular;  Laterality: N/A;  . LEFT HEART CATHETERIZATION WITH CORONARY/GRAFT ANGIOGRAM  08/30/2012   Procedure: LEFT HEART CATHETERIZATION WITH Beatrix Fetters;  Surgeon: Troy Sine, MD;  Location: [facility] CATH LAB;  Service: Cardiovascular;;  . LEFT HEART CATHETERIZATION WITH CORONARY/GRAFT ANGIOGRAM N/A 10/04/2012   Procedure: LEFT HEART CATHETERIZATION WITH Beatrix Fetters;  Surgeon: Leonie Man, MD;  Location: [facility] CATH LAB;  Service: Cardiovascular;  Laterality: N/A;  . LEFT HEART CATHETERIZATION WITH CORONARY/GRAFT ANGIOGRAM N/A 10/17/2012   Procedure: LEFT HEART CATHETERIZATION WITH Beatrix Fetters;  Surgeon: Leonie Man, MD;  Location: [facility] CATH LAB;  Service: Cardiovascular;  Laterality: N/A;  . LUMBAR DISC SURGERY  1980's?  . PERCUTANEOUS CORONARY STENT INTERVENTION (PCI-S) N/A 10/31/2012   Procedure: PERCUTANEOUS CORONARY STENT INTERVENTION (PCI-S);  Surgeon: Lorretta Harp, MD;  Location: [facility] CATH LAB;  Service: Cardiovascular;  Laterality: N/A;  . RADIOLOGY WITH ANESTHESIA N/A 12/03/2017   Procedure: embolization;  Surgeon: Luanne Bras, MD;  Location: [place];  Service: Radiology;  Laterality: N/A;  . RADIOLOGY WITH ANESTHESIA N/A 01/30/2018   Procedure: EMBOLIZATION;  Surgeon: Luanne Bras, MD;  Location: [place];  Service: Radiology;  Laterality: N/A;  . TONSILLECTOMY     "as a kid" (08/29/2012)  . TRANSTHORACIC ECHOCARDIOGRAM  10/18/2012   LV EF 40%- 45%   . US ECHOCARDIOGRAPHY  02/29/2012    Allergies: Patient has no known allergies.  Medications: Prior to Admission medications   Medication Sig Start Date End Date Taking? Authorizing Provider  amLODipine  (NORVASC) 5 MG tablet Take 1 tablet (5 mg total) by mouth daily. 10/25/12   Erlene Quan, PA-C  aspirin 325 MG EC tablet Take 1 tablet (325 mg total) by mouth daily. 10/06/17   Kayleen Memos, DO  atorvastatin (LIPITOR) 80 MG tablet Take 1 tablet (80 mg total) by mouth daily. 10/10/17   Lorretta Harp, MD  carvedilol (COREG) 3.125 MG tablet Take 3.125 mg by mouth 2 (two) times daily with a meal.    [provider]  clopidogrel (PLAVIX) 75 MG tablet Take 1 tablet (75 mg total) by mouth daily. 01/31/18   Ascencion Dike, PA-C  ferrous sulfate 325 (65 FE) MG tablet Take 1 tablet (325 mg total) by mouth 3 (three) times daily with meals. Patient taking differently: Take 325 mg by mouth 2 (two) times daily with a meal.  10/25/12   Kilroy, Doreene Burke, PA-C  finasteride (PROSCAR) 5 MG tablet Take 5 mg by mouth daily.    [provider]  hydrochlorothiazide (HYDRODIURIL) 25 MG tablet Take 25 mg by mouth daily.    [provider]  ibuprofen (ADVIL,MOTRIN) 200 MG tablet Take 400 mg by mouth every 6 (six) hours as needed for mild pain.    [provider]  isosorbide mononitrate (IMDUR) 30 MG 24 hr tablet Take 1 tablet (30 mg total) by mouth daily. 04/23/13   Croitoru, Mihai, MD  lidocaine (LIDODERM) 5 % Place 1  patch onto the skin daily as needed (for pain). Remove & Discard patch within 12 hours or as directed by MD     [provider]  nicotine (NICODERM CQ - DOSED IN MG/24 HOURS) 21 mg/24hr patch Place 21 mg onto the skin daily.    [provider]  nitroGLYCERIN (NITROSTAT) 0.4 MG SL tablet Place 1 tablet (0.4 mg total) under the tongue every 5 (five) minutes x 3 doses as needed for chest pain. 10/07/12   Brett Canales, PA-C  omeprazole (PRILOSEC) 20 MG capsule Take 20 mg by mouth 2 (two) times daily.    [provider]  potassium chloride SA (K-DUR,KLOR-CON) 20 MEQ tablet Take 20 mEq by mouth 2 (two) times daily.    [provider]    terazosin (HYTRIN) 10 MG capsule Take 10 mg by mouth at bedtime.    [provider]     Family History  Problem Relation Age of Onset  . Heart disease Mother   . Diabetes Mother   . Heart disease Father   . Diabetes Father   . Healthy Sister   . Diabetes Brother   . Diabetes Sister   . Diabetes Brother     Social History   Socioeconomic History  . Marital status: Married    Spouse name: Not on file  . Number of children: Not on file  . Years of education: Not on file  . Highest education level: Not on file  Occupational History  . Not on file  Social Needs  . Financial resource strain: Not on file  . Food insecurity:    Worry: Not on file    Inability: Not on file  . Transportation needs:    Medical: Not on file    Non-medical: Not on file  Tobacco Use  . Smoking status: Former Smoker    Packs/day: 0.50    Years: 50.00    Pack years: 25.00    Types: Cigarettes    Last attempt to quit: 12/02/2011    Years since quitting: 6.2  . Smokeless tobacco: Never Used  . Tobacco comment: NIC PATCH  Substance and Sexual Activity  . Alcohol use: Yes    Comment: RARE  . Drug use: No  . Sexual activity: Yes  Lifestyle  . Physical activity:    Days per week: Not on file    Minutes per session: Not on file  . Stress: Not on file  Relationships  . Social connections:    Talks on phone: Not on file    Gets together: Not on file    Attends religious service: Not on file    Active member of club or organization: Not on file    Attends meetings of clubs or organizations: Not on file    Relationship status: Not on file  Other Topics Concern  . Not on file  Social History Narrative   [place] [phone]. History of Agent Orange exposure, followed at [place] of Systems: A 12 point ROS discussed and pertinent positives are indicated in the HPI above.  All other systems are negative.  Review of Systems  Constitutional: Negative for activity change and  fever.  HENT: Negative for hearing loss and tinnitus.   Eyes: Negative for visual disturbance.  Respiratory: Negative for shortness of breath and wheezing.   Cardiovascular: Negative for chest pain and palpitations.  Neurological: Negative for dizziness, speech difficulty, weakness, numbness and headaches.  Psychiatric/Behavioral: Negative for behavioral  problems and confusion.    Vital Signs: There were no vitals taken for this visit.  Physical Exam  Constitutional: He is oriented to person, place, and time. He appears well-developed and well-nourished. No distress.  Neurological: He is alert and oriented to person, place, and time.  Psychiatric: He has a normal mood and affect. His behavior is normal. Judgment and thought content normal.    Imaging: Ir Sherre Lain Stent Cerv Carotid W/o Emb-prot Mod Sed  Result Date: 02/01/2018 CLINICAL DATA:  Symptomatic right internal carotid artery proximal irregular lobulated aneurysm/pseudoaneurysm. EXAM: INTRAVSC STENT CERV CAROTID W/O EMB-PROT COMPARISON:  Diagnostic catheter arteriogram of 10/05/2017. MEDICATIONS: Heparin 3,500 units IV. Ancef 2 g IV antibiotic was administered within 1 hour of the procedure. ANESTHESIA/SEDATION: Mac anesthesia as per the Department of Anesthesiology at [place]:  Isovue 300 approximately 60 mL. FLUOROSCOPY TIME:  Fluoroscopy Time: 19 minutes 43 seconds (627.9 mGy). COMPLICATIONS: None immediate. TECHNIQUE: Informed written consent was obtained from the patient after a thorough discussion of the procedural risks, benefits and alternatives. All questions were addressed. Maximal Sterile Barrier Technique was utilized including caps, mask, sterile gowns, sterile gloves, sterile drape, hand hygiene and skin antiseptic. A timeout was performed prior to the initiation of the procedure. The left groin was prepped and draped in the usual sterile fashion. Thereafter using modified Seldinger technique,  transfemoral access into the left common femoral artery was obtained without difficulty. Over a 0.035 inch guidewire, a 5 French Pinnacle sheath was inserted. Through this, and also over 0.035 inch guidewire, a 5 [place] JB 1 catheter was advanced to the aortic arch region and selectively positioned in the right common carotid artery. Arteriograms were then performed centered extra cranially and intracranially. FINDINGS: The right common carotid arteriogram demonstrates the right external carotid artery and its major branches to be widely patent. The right internal carotid artery at the bulb demonstrates a smooth narrowing associated with a lobulated irregular aneurysmal outpouching from its posterior wall. This measures approximately 7.4 mm x 6.1 mm. More distally the right internal carotid artery is seen to opacify normally. The petrous, the cavernous and the supraclinoid segments demonstrate wide patency. There is caliber irregularity involving the petrous cavernous junction with a slightly focal wide-based outpouching from the inferior aspect of the proximal cavernous segment. A right posterior communicating artery is seen opacifying the right posterior cerebral distribution. The right middle cerebral artery and the right anterior cerebral artery opacify normally into the capillary and venous phases. ENDOVASCULAR TREATMENT OF THE IRREGULAR SIDEWALL SACCULAR ANEURYSM/PSEUDOANEURYSM ARISING FROM THE PROXIMAL RIGHT INTERNAL CAROTID ARTERY. The diagnostic JB 1 catheter in the right common carotid artery was exchanged over a 0.035 inch 300 cm Rosen exchange guidewire for a 6 French 80 cm Cook shuttle sheath using biplane roadmap technique and constant fluoroscopic guidance. Good aspiration obtained from the hub of the [facility] shuttle sheath. A gentle contrast injection demonstrated no evidence of spasms, dissections or of intraluminal filling defects. This was then connected to continuous heparinized saline infusion.  Over a 0.014 inch Softip Synchro micro guidewire, a SL 10 2 tip microcatheter was then advanced distal to the [employer] sheath. Using a torque device, the micro guidewire was manipulated without difficulty to the distal petrous segment of the right internal carotid artery followed by the microcatheter. The guidewire was removed. Good aspiration obtained from the hub of the microcatheter. A gentle contrast injection demonstrated free antegrade flow distally. This in turn was then exchanged for a 300 cm 0.014  inch Softip Transend exchange micro guidewire using biplane roadmap technique and constant fluoroscopic guidance. The distal end of the exchange micro guidewire had a J configuration to avoid dissections or inducing spasm. A control arteriogram performed through the [facility] shuttle sheath following the removal of the microcatheter demonstrated no significant changes extra cranially or intracranially. Measurements were then performed of the internal carotid artery in its normal proximal segment just distal to the irregular aneurysmal outpouching, and also of the normal segment of the right common carotid artery. It was elected to place a 7/9 x 40 mm Xact stent across the aneurysmal abnormality. The delivery system of this stent was then retrogradely flushed with heparinized saline infusion. Thereafter, using the rapid exchange technique, the delivery system was then advanced over the exchange micro guidewire using constant intermittent fluoroscopy. The proximal and the distal landing zones were verified. The stent was then deployed without difficulty. The delivery system was then retrieved and removed, with the distal wire being maintained in a stable position in the horizontal petrous segment of the right internal carotid artery. A control angiogram performed through the [facility] shuttle sheath demonstrated excellent apposition of the stent distally and proximally with already stagnation and slow clearance of  contrast from the aneurysmal abnormality. Control arteriograms were then performed at 15, 30 and 40 minutes post stent deployment. These continued to demonstrate excellent flow through the stented segment of the right internal carotid artery/common carotid artery. Intracranially there was marginally improved hemodynamic contrast in the right middle and the right anterior cerebral artery distributions. No evidence of intraluminal filling defect, or of occlusion was noted. Clinically, the patient appeared stable neurologically and hemodynamically. Throughout the procedure, the patient's ACT was maintained in the region of approximately 200 seconds. Following the final control arteriogram, the exchange micro guidewire was then gently retrieved under constant fluoroscopic guidance to ensure no entanglement or movement of the stent. None was observed. The [facility] shuttle sheath was then retrieved into the abdominal aorta and exchanged over a J-tip guidewire for a 6 [place] Pinnacle sheath. This in turn was then successfully removed with the external application of manual compression for hemostasis. Following this the left groin appeared soft without evidence of a hematoma or bleeding. The distal pulses in both feet were palpable unchanged from prior to the procedure. The patient was then transported to the neuro ICU. Overnight, the patient remained stable neurologically and hemodynamically. The following day, the IV heparin was stopped. The patient was switched to Plavix 75 mg a day, and aspirin 325 mg a day. He was started on his home meds. The left groin appeared soft with no change in the distal pulses. The patient was then encouraged to eat and ambulate. The patient was to be discharged should there be no complications with the above. The patient was asked to maintain adequate hydration once at home with tap water. He was advised to avoid stooping, bending or lifting weights above 10 pounds for a couple of weeks, and  avoid driving. Questions were answered to his satisfaction. He was advised to call 911 should he develop symptoms of motor weakness, numbness, tingling, incoordination, facial droop or visual or speech difficulties. A platelet inhibition test will be obtained prior to discharge. IMPRESSION: Status post endovascular treatment of an irregular lobulated aneurysmal dilatation of the proximal right internal carotid artery with the placement of a stent as described. PLAN: Follow-up in the clinic in 2 weeks time. Electronically Signed   By: Luanne Bras M.D.   On:  01/31/2018 09:44    Labs:  CBC: Recent Labs    11/30/17 1022 01/28/18 0933 01/30/18 2207 01/31/18 0517  WBC 7.6 7.1 7.4 7.7  HGB 14.6 14.8 12.9* 13.1  HCT 42.3 43.7 38.0* 38.7*  PLT 179 164 132* 137*    COAGS: Recent Labs    10/03/17 2017 11/30/17 1022 01/28/18 0933  INR 1.02 1.00 0.99  APTT 31 30 30     BMP: Recent Labs    10/05/17 0415 11/30/17 1022 01/28/18 0933 01/31/18 0517  NA 139 137 139 139  K 3.7 3.7 3.6 3.2*  CL 108 104 104 109  CO2 26 23 25 22   GLUCOSE 92 98 96 96  BUN 17 24* 21* 13  CALCIUM 8.7* 9.0 9.0 8.1*  CREATININE 0.85 0.82 1.01 0.60*  GFRNONAA >60 >60 >60 >60  GFRAA >60 >60 >60 >60    LIVER FUNCTION TESTS: Recent Labs    10/03/17 2017 11/30/17 1022 01/28/18 0933  BILITOT 0.2* 0.7 0.6  AST 20 24 29   ALT 20 29 37  ALKPHOS 68 83 89  PROT 6.5 6.7 6.9  ALBUMIN 3.6 3.8 3.7    TUMOR MARKERS: No results for input(s): AFPTM, CEA, CA199, CHROMGRNA in the last 8760 hours.  Assessment and Plan:  Right proximal ICA aneurysm s/p embolization 01/30/2018. Imaging from procedure 01/30/2018 reviewed with patient. Explained to patient that continued medical management is the best course of action at this time. This involves continued Plavix and Aspirin use along with image monitoring.  Plan for follow-up with carotid ultrasound in 3-4 months. Instructed patient to continue taking Plavix 75  mg once daily and Aspirin 325 mg once daily.  All questions answered and concerns addressed. Patient conveys understanding and agrees with plan.  Thank you for this interesting consult.  I greatly enjoyed meeting Darrell Grantz. and look forward to participating in their care.  A copy of this report was sent to the requesting provider on this date.  Electronically Signed: Earley Abide, PA-C 02/20/2018, 1:22 PM   I spent a total of 30 minutes in face to face in clinical consultation, greater than 50% of which was counseling/coordinating care for right proximal ICA aneurysm s/p embolization.

## 2018-05-07 ENCOUNTER — Ambulatory Visit (INDEPENDENT_AMBULATORY_CARE_PROVIDER_SITE_OTHER): Payer: Medicare Other | Admitting: Cardiovascular Disease

## 2018-05-07 ENCOUNTER — Encounter: Payer: Self-pay | Admitting: Cardiovascular Disease

## 2018-05-07 VITALS — BP 142/68 | HR 50 | Ht 70.0 in | Wt 180.0 lb

## 2018-05-07 DIAGNOSIS — Z951 Presence of aortocoronary bypass graft: Secondary | ICD-10-CM

## 2018-05-07 DIAGNOSIS — I255 Ischemic cardiomyopathy: Secondary | ICD-10-CM

## 2018-05-07 DIAGNOSIS — E785 Hyperlipidemia, unspecified: Secondary | ICD-10-CM

## 2018-05-07 DIAGNOSIS — I10 Essential (primary) hypertension: Secondary | ICD-10-CM

## 2018-05-07 NOTE — Assessment & Plan Note (Signed)
History of CAD status post coronary artery bypass grafting by Dr. [name] January 2013.  This was done after I catheterized him revealing three-vessel disease with moderate LV dysfunction.  He had a LIMA to his LAD, vein to marginal branch and RCA.  He was catheterized again by Dr. Claiborne Billings October 2015 revealing an occluded vein graft to the circumflex with a total left main the LIMA was patent as was the RCA vein graft.  He ultimately underwent redo bypass grafting x1 with a vein to a distal marginal branch November 2015 with marked improvement however because of recurrent chest pain I recatheterized him revealing pre-anastomotic lesion which I stented which markedly improved his symptoms.  He is been asymptomatic since.

## 2018-05-07 NOTE — Patient Instructions (Signed)

## 2018-05-07 NOTE — Assessment & Plan Note (Signed)
History of ischemic cardiomyopathy in the past with his most recent echo performed 10/05/2017 revealing normal LV systolic function.  He is completely asymptomatic.

## 2018-05-07 NOTE — Assessment & Plan Note (Signed)
History of dyslipidemia on statin therapy 

## 2018-05-07 NOTE — Assessment & Plan Note (Signed)
History of essential hypertension her blood pressure measured today 142/68.  He is on amlodipine and hydrochlorothiazide.  Continue current meds at current dosing.

## 2018-05-07 NOTE — Progress Notes (Signed)
05/07/2018 [name].   06-18-1946  [id number]  [facility], [facility] Cardiologist: [name] FACP, [place], [place], [place]  HPI:  [name]. is a 72 y.o.  mildly overweight married Caucasian male, father of 3 and grandfather to 7 grandchildren, whom I last saw in the office  10/10/2017 He has a history of CAD, status post coronary artery bypass grafting by [name] in January of last year after a catheterization performed by me December 02, 2011, revealed left main 3-vessel disease with moderate LV dysfunction. He had a LIMA to his LAD, a vein to an OM branch and to the RCA. His postoperative course was uncomplicated. He did stop smoking at that time. His other problems include hypertension and hyperlipidemia as well as a family history of heart disease. He had recurrent episodes of unstable angina and ultimately underwent recatheterization by [name] in October of last year revealing occluded vein to the circumflex marginal branch with a total left main. He had a patent LIMA to his LAD and a patent vein to the RCA. It was presumed that his angina and non-STEMIs were related to ischemia in the circumflex territory. He ultimately underwent redo bypass surgery x1 to the circumflex obtuse marginal branch in November and returned several times after that with recurrent angina. Catheterization revealed a kink in the vessel just proximal to the distal anastomosis as well as preanastomotic lesion. I ultimately performed stenting of his newly-placed circumflex marginal vein graft on October 31, 2012, which has resulted in complete resolution of his anginal symptoms. He participated in cardiac rehab. Recently he developed recurrent chest pain. A Myoview stress test performed on 05/13/13 was read as "high risk" is remarkable for significant anterolateral and inferolateral ischemia.  I performed cardiac catheterization on him 05/16/13 revealing high-grade  in-stent restenosis" within the circumflex obtuse marginal branch SVG distal stented segment as well as at the aorto/ostium. I we stented the distal portion of the vein graft and stent at the origin as well as drug-eluting stents.he has remained asymptomatic since I saw him in the office one year ago. Unfortunately his wife a 40 years passed away in 01/26/15 when I last still grieving. He is accompanied by his new fiancs Salvatore Marvel today.   Since I saw him last he has remained stable from a cardiovascular point of view. He was recently admitted to the hospital 09/25/17 with a vertebrobasilar stroke. He apparently had a cerebral angiogram performed by Dr. Patrecia Pour 10/03/17 revealing occluded vertebral artery on the left at the C1 segment. Apparently there is plan for intervention.  Since I saw him in the office 6 months ago he is remained stable denying chest pain or shortness of breath.      Current Meds  Medication Sig  . amLODipine (NORVASC) 5 MG tablet Take 1 tablet (5 mg total) by mouth daily.  Marland Kitchen aspirin 325 MG EC tablet Take 1 tablet (325 mg total) by mouth daily.  Marland Kitchen atorvastatin (LIPITOR) 80 MG tablet Take 1 tablet (80 mg total) by mouth daily.  . carvedilol (COREG) 3.125 MG tablet Take 3.125 mg by mouth 2 (two) times daily with a meal.  . clopidogrel (PLAVIX) 75 MG tablet Take 1 tablet (75 mg total) by mouth daily.  . ferrous sulfate 325 (65 FE) MG tablet Take 1 tablet (325 mg total) by mouth 3 (three) times daily with meals. (Patient taking differently: Take 325 mg by  mouth 2 (two) times daily with a meal. )  . finasteride (PROSCAR) 5 MG tablet Take 5 mg by mouth daily.  . hydrochlorothiazide (HYDRODIURIL) 25 MG tablet Take 25 mg by mouth daily.  Marland Kitchen ibuprofen (ADVIL,MOTRIN) 200 MG tablet Take 400 mg by mouth every 6 (six) hours as needed for mild pain.  . isosorbide mononitrate (IMDUR) 30 MG 24 hr tablet Take 1 tablet (30 mg total) by mouth daily.  Marland Kitchen lidocaine (LIDODERM) 5 %  Place 1 patch onto the skin daily as needed (for pain). Remove & Discard patch within 12 hours or as directed by MD   . nicotine (NICODERM CQ - DOSED IN MG/24 HOURS) 21 mg/24hr patch Place 21 mg onto the skin daily.  . nitroGLYCERIN (NITROSTAT) 0.4 MG SL tablet Place 1 tablet (0.4 mg total) under the tongue every 5 (five) minutes x 3 doses as needed for chest pain.  Marland Kitchen omeprazole (PRILOSEC) 20 MG capsule Take 20 mg by mouth 2 (two) times daily.  . potassium chloride SA (K-DUR,KLOR-CON) 20 MEQ tablet Take 20 mEq by mouth 2 (two) times daily.  Marland Kitchen terazosin (HYTRIN) 10 MG capsule Take 10 mg by mouth at bedtime.     No Known Allergies  Social History   Socioeconomic History  . Marital status: Married    Spouse name: Not on file  . Number of children: Not on file  . Years of education: Not on file  . Highest education level: Not on file  Occupational History  . Not on file  Social Needs  . Financial resource strain: Not on file  . Food insecurity:    Worry: Not on file    Inability: Not on file  . Transportation needs:    Medical: Not on file    Non-medical: Not on file  Tobacco Use  . Smoking status: Former Smoker    Packs/day: 0.50    Years: 50.00    Pack years: 25.00    Types: Cigarettes    Last attempt to quit: 12/02/2011    Years since quitting: 6.4  . Smokeless tobacco: Never Used  . Tobacco comment: NIC PATCH  Substance and Sexual Activity  . Alcohol use: Yes    Comment: RARE  . Drug use: No  . Sexual activity: Yes  Lifestyle  . Physical activity:    Days per week: Not on file    Minutes per session: Not on file  . Stress: Not on file  Relationships  . Social connections:    Talks on phone: Not on file    Gets together: Not on file    Attends religious service: Not on file    Active member of club or organization: Not on file    Attends meetings of clubs or organizations: Not on file    Relationship status: Not on file  . Intimate partner violence:    Fear of  current or ex partner: Not on file    Emotionally abused: Not on file    Physically abused: Not on file    Forced sexual activity: Not on file  Other Topics Concern  . Not on file  Social History Narrative   [place] [phone]. History of Agent Orange exposure, followed at [facility]: General: negative for chills, fever, night sweats or weight changes.  Cardiovascular: negative for chest pain, dyspnea on exertion, edema, orthopnea, palpitations, paroxysmal nocturnal dyspnea or shortness of breath Dermatological: negative for rash Respiratory: negative for cough or  wheezing Urologic: negative for hematuria Abdominal: negative for nausea, vomiting, diarrhea, bright red blood per rectum, melena, or hematemesis Neurologic: negative for visual changes, syncope, or dizziness All other systems reviewed and are otherwise negative except as noted above.    Blood pressure (!) 142/68, pulse (!) 50, height 5\' 10"  (1.778 m), weight 180 lb (81.6 kg).  General appearance: alert and no distress Neck: no adenopathy, no carotid bruit, no JVD, supple, symmetrical, trachea midline and thyroid not enlarged, symmetric, no tenderness/mass/nodules Lungs: clear to auscultation bilaterally Heart: regular rate and rhythm, S1, S2 normal, no murmur, click, rub or gallop Extremities: extremities normal, atraumatic, no cyanosis or edema Pulses: 2+ and symmetric Skin: Skin color, texture, turgor normal. No rashes or lesions Neurologic: Alert and oriented X 3, normal strength and tone. Normal symmetric reflexes. Normal coordination and gait  EKG not performed today  ASSESSMENT AND PLAN:   S/P CABG x 3 11/2011: LIMA TO LAD, SVG TO OM, SVG TO RCA., VG to OM occluded, jeopardized LCX, by cath 08/29/12 -- Re-Do CABG with SVG-OM (09/2012) History of CAD status post coronary artery bypass grafting by Dr. [name] January 2013.  This was done after I catheterized him revealing three-vessel disease with  moderate LV dysfunction.  He had a LIMA to his LAD, vein to marginal branch and RCA.  He was catheterized again by Dr. Claiborne Billings October 2015 revealing an occluded vein graft to the circumflex with a total left main the LIMA was patent as was the RCA vein graft.  He ultimately underwent redo bypass grafting x1 with a vein to a distal marginal branch November 2015 with marked improvement however because of recurrent chest pain I recatheterized him revealing pre-anastomotic lesion which I stented which markedly improved his symptoms.  He is been asymptomatic since.  HTN (hypertension) History of essential hypertension her blood pressure measured today 142/68.  He is on amlodipine and hydrochlorothiazide.  Continue current meds at current dosing.  Dyslipidemia History of dyslipidemia on statin therapy.  ICM, EF 40% Jan 2013,  50-55% Oct 2013,  40-45% with apical infarct 10/18/12 History of ischemic cardiomyopathy in the past with his most recent echo performed 10/05/2017 revealing normal LV systolic function.  He is completely asymptomatic.      [name] FACP,FACC,FAHA, [facility] 05/07/2018 10:22 AM

## 2018-05-16 NOTE — Telephone Encounter (Signed)
.  .  .        xx

## 2018-05-16 NOTE — Telephone Encounter (Signed)
No note

## 2018-05-16 NOTE — Telephone Encounter (Signed)
Phone call attempt 

## 2018-05-27 ENCOUNTER — Telehealth (HOSPITAL_COMMUNITY): Payer: Self-pay

## 2018-05-27 NOTE — Telephone Encounter (Signed)
Called to schedule us carotid, no answer, left vm. AW  

## 2018-06-04 NOTE — Progress Notes (Signed)
[facility] [address] [place]. [place] [address] [phone]       OFFICE FOLLOW-UP NOTE  [name]. Date of Birth:  June 22, 1946 Medical Record Number:  [id number]   [name] is being seen today in the office for hospital follow-up for chronic vertebral artery occlusion on 09/25/17. History obtained from patient and chart review. Reviewed all radiology images and labs personally.   HPI: [name]. is a 72 y.o. male with history of CAD status post CABG status post stenting, hypertension hyperlipidemia presents to the ER because of persistent difficulty walking over the last 2 weeks with headache.  Patient has been having the symptoms for the last 2 weeks initially with balance issues and eventually started developing left-sided headache.  Also has been having decreased hearing on the left ear.  Patient also complains of some blurred vision of the left side.In the ER patient had CT head followed by MRI of the brain and MRV of the brain and eventually had a CT angiogram of the head and neck and was seen by neurologist Dr. Leonel Green.  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  Otherwise he denies any weakness of the upper or lower extremities.  Denies any difficulty swallowing or speaking. It was recommended that pt continue antiplatelet therapy and statins for vertebrobasilar insufficiency with dissection of the left vertebral artery. IR angio was performed on 11/30 and no intervention as needed at that time. MRI cervical was completed and negative for stenosis. Pt was cleared by PT/OT for home d/c. DAPT was started and recommended to continue for 3 months post stroke.   12/06/17 visit: At todays appointment, pts symptoms have completely resolved and has not had any TIA/stroke like symptoms since discharge. Pt has stopped smoking since discharge. Pt continues to take  plavix 75mg  and aspirin 325mg  which he denies side effects such as bleeding or bruising.. Lipitor was increased by cardiologist to 80mg  daily which he is tolerating without side effects. Pt went for aneurysm embolization on 12/03/17 but was cancelled due to P2Y12 at 217. Patient plans for follow up for ICA aneurysm with Dr. Estanislado Green.   06/05/2018 update: Patient is being seen today for routine follow-up and overall is doing well from a stroke standpoint.  He did have right ICA stent placement for irregular lobulated sidewall aneurysm/pseudoaneurysm on 01/30/2018.  Patient did have follow-up 3 weeks after procedure with Dr. Estanislado Green and overall is doing well.  Patient continues to do all previous activities without complications.  Continues to take aspirin 325 for CAD and Plavix for secondary stroke prevention with mild bruising but no bleeding.  Continues to take Lipitor without side effects of myalgias.  Blood pressure today satisfactory 125/70 and patient does continue to monitor this intermittently.  Denies new or worsening stroke/TIA symptoms.    ROS:   14 system review of systems is positive for no complaints and all other systems negative.   PMH:  Past Medical History:  Diagnosis Date  . Anginal pain ([place])   . Arthritis    "in my back and neck" (09-16-12)  . CAD, CABG X 09 Nov 2011. re-do CABG X1 09/19/12 12/04/2011  . Coronary artery disease   . DDD (degenerative disc disease), cervical   . Hypercholesterolemia   . Hypertension   . Malaria    "I've had it a couple times in the 1960's; almost died from it" (09-16-2012)  .  NSTEMI (non-ST elevated myocardial infarction) ([place]) 11/2011   anterior  . NSTEMI (non-ST elevated myocardial infarction) ([place]) 08/29/2012  . PTSD (post-traumatic stress disorder)   . PTSD (post-traumatic stress disorder)   . S/P angioplasty with stent,  10/31/12 VG to OM that was new graft from 11/13 in setting of NSTEMI 05/05/2013  . Seizures ([place])    1 TIME  .  Vertebro basilar insufficiency 10/03/2017    Social History:  Social History   Socioeconomic History  . Marital status: Married    Spouse name: Not on file  . Number of children: Not on file  . Years of education: Not on file  . Highest education level: Not on file  Occupational History  . Not on file  Social Needs  . Financial resource strain: Not on file  . Food insecurity:    Worry: Not on file    Inability: Not on file  . Transportation needs:    Medical: Not on file    Non-medical: Not on file  Tobacco Use  . Smoking status: Former Smoker    Packs/day: 0.50    Years: 50.00    Pack years: 25.00    Types: Cigarettes    Last attempt to quit: 12/02/2011    Years since quitting: 6.5  . Smokeless tobacco: Never Used  . Tobacco comment: NIC PATCH  Substance and Sexual Activity  . Alcohol use: Yes    Comment: RARE  . Drug use: No  . Sexual activity: Yes  Lifestyle  . Physical activity:    Days per week: Not on file    Minutes per session: Not on file  . Stress: Not on file  Relationships  . Social connections:    Talks on phone: Not on file    Gets together: Not on file    Attends religious service: Not on file    Active member of club or organization: Not on file    Attends meetings of clubs or organizations: Not on file    Relationship status: Not on file  . Intimate partner violence:    Fear of current or ex partner: Not on file    Emotionally abused: Not on file    Physically abused: Not on file    Forced sexual activity: Not on file  Other Topics Concern  . Not on file  Social History Narrative   [place] [phone]. History of Agent Orange exposure, followed at [place]    Medications:   Current Outpatient Medications on File Prior to Visit  Medication Sig Dispense Refill  . amLODipine (NORVASC) 5 MG tablet Take 1 tablet (5 mg total) by mouth daily. 30 tablet 5  . aspirin 325 MG EC tablet Take 1 tablet (325 mg total) by mouth daily. 30 tablet 0  .  atorvastatin (LIPITOR) 80 MG tablet Take 1 tablet (80 mg total) by mouth daily. 30 tablet 6  . carvedilol (COREG) 3.125 MG tablet Take 3.125 mg by mouth 2 (two) times daily with a meal.    . clopidogrel (PLAVIX) 75 MG tablet Take 1 tablet (75 mg total) by mouth daily. 30 tablet 0  . ferrous sulfate 325 (65 FE) MG tablet Take 1 tablet (325 mg total) by mouth 3 (three) times daily with meals. (Patient taking differently: Take 325 mg by mouth 2 (two) times daily with a meal. ) 30 tablet 5  . finasteride (PROSCAR) 5 MG tablet Take 5 mg by mouth daily.    . hydrochlorothiazide (HYDRODIURIL) 25 MG  tablet Take 25 mg by mouth daily.    Marland Kitchen ibuprofen (ADVIL,MOTRIN) 200 MG tablet Take 400 mg by mouth every 6 (six) hours as needed for mild pain.    . isosorbide mononitrate (IMDUR) 30 MG 24 hr tablet Take 1 tablet (30 mg total) by mouth daily. 90 tablet 3  . lidocaine (LIDODERM) 5 % Place 1 patch onto the skin daily as needed (for pain). Remove & Discard patch within 12 hours or as directed by MD     . nicotine (NICODERM CQ - DOSED IN MG/24 HOURS) 21 mg/24hr patch Place 21 mg onto the skin daily.    . nitroGLYCERIN (NITROSTAT) 0.4 MG SL tablet Place 1 tablet (0.4 mg total) under the tongue every 5 (five) minutes x 3 doses as needed for chest pain. 25 tablet 5  . omeprazole (PRILOSEC) 20 MG capsule Take 20 mg by mouth 2 (two) times daily.    . potassium chloride SA (K-DUR,KLOR-CON) 20 MEQ tablet Take 20 mEq by mouth 2 (two) times daily.    Marland Kitchen terazosin (HYTRIN) 10 MG capsule Take 10 mg by mouth at bedtime.     No current facility-administered medications on file prior to visit.     Allergies:  No Known Allergies  Physical Exam General: well developed, well nourished, pleasant elderly Caucasian male, seated, in no evident distress Head: head normocephalic and atraumatic.   Neck: supple with no carotid or supraclavicular bruits Cardiovascular: regular rate and rhythm, no murmurs Musculoskeletal: no  deformity Skin:  no rash/petichiae Vascular:  Normal pulses all extremities  Neurologic Exam Mental Status: Awake and fully alert. Oriented to place and time. Recent and remote memory intact. Attention span, concentration and fund of knowledge appropriate. Mood and affect appropriate.  Cranial Nerves: Fundoscopic exam reveals sharp disc margins. Pupils equal, briskly reactive to light. Extraocular movements full without nystagmus. Visual fields full to confrontation. Hearing intact. Facial sensation intact. Face, tongue, palate moves normally and symmetrically.  Motor: Normal bulk and tone. Normal strength in all tested extremity muscles. Sensory.: intact to touch , pinprick , position and vibratory sensation.  Coordination: Rapid alternating movements normal in all extremities. Finger-to-nose and heel-to-shin performed accurately bilaterally. Gait and Station: Arises from chair without difficulty. Stance is normal. Gait demonstrates normal stride length and balance . Able to heel, toe and tandem walk without difficulty.  Reflexes: 1+ and symmetric. Toes downgoing.     ASSESSMENT: [name] is a 72 year old caucasian male with recent admission for  Vertebrobasilar TIAs followingchronic vertebral artery occlusion on 09/25/17. Vascular risk factors are CAD s/p CABG, HTN, and HLD. Incidental finding of right carotid aneurysm etiology unclear no definite evidence to suggest dissection or old injury.  On 01/30/2018, patient underwent right ICA stent placement for a regular lobulated sidewall aneurysm/pseudoaneurysm in which he tolerated well without complications.  Patient returns for routine follow-up visit and overall doing well.   PLAN: -Continue aspirin 325 mg daily and clopidogrel 75 mg daily  and Lipitor for secondary stroke prevention -Patient questioning whether full dose of aspirin as needed and was advised to follow-up with cardiologist in regards to this -F/u with PCP regarding your HLD  and HTN management -f/u with cardiology as scheduled for chronic cardiac conditions -continue to monitor BP at home -Advised to continue to stay active and maintain a healthy diet -Maintain strict control of hypertension with blood pressure goal below 130/90, diabetes with hemoglobin A1c goal below 6.5% and cholesterol with LDL cholesterol (bad cholesterol) goal below 70  mg/dL. I also advised the patient to eat a healthy diet with plenty of whole grains, cereals, fruits and vegetables, exercise regularly and maintain ideal body weight.  Follow up as needed as patient is stable from stroke standpoint or call earlier if needed   Greater than 50% of time during this 25 minute visit was spent on counseling,explanation of diagnosisof TIA and carotid aneurysm, planning of further management, discussion with patient and family and coordination of care.   Venancio Poisson, AGNP-BC  [facility] [address] [place], [place] [address]  Phone [phone] Fax [phone] Note: This document was prepared with digital dictation and possible smart phrase technology. Any transcriptional errors that result from this process are unintentional.

## 2018-06-05 ENCOUNTER — Ambulatory Visit (INDEPENDENT_AMBULATORY_CARE_PROVIDER_SITE_OTHER): Payer: Medicare Other | Admitting: Adult Health

## 2018-06-05 ENCOUNTER — Encounter: Payer: Self-pay | Admitting: Adult Health

## 2018-06-05 VITALS — BP 125/70 | HR 49 | Ht 70.0 in | Wt 179.0 lb

## 2018-06-05 DIAGNOSIS — Z9889 Other specified postprocedural states: Secondary | ICD-10-CM

## 2018-06-05 DIAGNOSIS — I10 Essential (primary) hypertension: Secondary | ICD-10-CM

## 2018-06-05 DIAGNOSIS — E785 Hyperlipidemia, unspecified: Secondary | ICD-10-CM

## 2018-06-05 DIAGNOSIS — G450 Vertebro-basilar artery syndrome: Secondary | ICD-10-CM

## 2018-06-05 NOTE — Patient Instructions (Signed)
Continue aspirin 325 mg daily and clopidogrel 75 mg daily  and lipitor  for secondary stroke prevention  Continue to stay active and maintain a health diet  Continue to follow up with PCP regarding cholesterol and blood pressure management   Continue to follow up with cardiology for chronic conditions  Continue to monitor blood pressure at home  Maintain strict control of hypertension with blood pressure goal below 130/90, diabetes with hemoglobin A1c goal below 6.5% and cholesterol with LDL cholesterol (bad cholesterol) goal below 70 mg/dL. I also advised the patient to eat a healthy diet with plenty of whole grains, cereals, fruits and vegetables, exercise regularly and maintain ideal body weight.  Followup in the future with as needed or call earlier if needed         Thank you for coming to see [place] at [facility], [facility], [facility]. I hope we have been able to provide you high quality care today.  You may receive a patient satisfaction survey over the next few weeks. We would appreciate your feedback and comments so that we may continue to improve ourselves and the health of our patients.

## 2018-06-06 ENCOUNTER — Telehealth: Payer: Self-pay

## 2018-06-06 NOTE — Telephone Encounter (Signed)
-----   Message from Darrell Poisson, NP sent at 06/05/2018  4:32 PM EDT ----- Please call patient to advise that he can start low dose asa 81mg  per cardiology. Thank you.

## 2018-06-06 NOTE — Telephone Encounter (Signed)
Rn call patient that per Janett Billow NP, the cardiologist wants him to start low dosage aspirin 81mg . Pt stated he was on 325 aspirin. Pt verbalized the reasoning of lowering aspirin dose to 81mg .

## 2018-06-20 NOTE — Progress Notes (Signed)
I agree with the above plan 

## 2018-06-26 ENCOUNTER — Other Ambulatory Visit (HOSPITAL_COMMUNITY): Payer: Self-pay | Admitting: Interventional Radiology

## 2018-06-26 DIAGNOSIS — I729 Aneurysm of unspecified site: Secondary | ICD-10-CM

## 2018-07-01 ENCOUNTER — Telehealth (HOSPITAL_COMMUNITY): Payer: Self-pay

## 2018-07-01 NOTE — Telephone Encounter (Signed)
Called to cancel US carotid. [facility] is [facility] and will have to send in another request for approval. Left message for pt to return call. AW

## 2018-07-02 ENCOUNTER — Ambulatory Visit (HOSPITAL_COMMUNITY): Admission: RE | Admit: 2018-07-02 | Payer: Non-veteran care | Source: Ambulatory Visit

## 2018-07-02 ENCOUNTER — Encounter (HOSPITAL_COMMUNITY): Payer: Self-pay

## 2018-07-05 ENCOUNTER — Ambulatory Visit (HOSPITAL_COMMUNITY): Payer: Non-veteran care

## 2018-11-01 ENCOUNTER — Encounter (HOSPITAL_COMMUNITY): Payer: Self-pay

## 2018-11-01 ENCOUNTER — Emergency Department (HOSPITAL_COMMUNITY)
Admission: EM | Admit: 2018-11-01 | Discharge: 2018-11-01 | Disposition: A | Discharge status: Home / Self Care | Payer: Non-veteran care | Attending: Emergency Medicine | Admitting: Emergency Medicine

## 2018-11-01 ENCOUNTER — Other Ambulatory Visit: Payer: Self-pay

## 2018-11-01 ENCOUNTER — Emergency Department (HOSPITAL_COMMUNITY): Payer: Non-veteran care

## 2018-11-01 DIAGNOSIS — I252 Old myocardial infarction: Secondary | ICD-10-CM | POA: Diagnosis not present

## 2018-11-01 DIAGNOSIS — Z7982 Long term (current) use of aspirin: Secondary | ICD-10-CM | POA: Diagnosis not present

## 2018-11-01 DIAGNOSIS — N281 Cyst of kidney, acquired: Secondary | ICD-10-CM | POA: Diagnosis not present

## 2018-11-01 DIAGNOSIS — Z955 Presence of coronary angioplasty implant and graft: Secondary | ICD-10-CM | POA: Diagnosis not present

## 2018-11-01 DIAGNOSIS — I10 Essential (primary) hypertension: Secondary | ICD-10-CM | POA: Insufficient documentation

## 2018-11-01 DIAGNOSIS — Z87891 Personal history of nicotine dependence: Secondary | ICD-10-CM | POA: Insufficient documentation

## 2018-11-01 DIAGNOSIS — Z951 Presence of aortocoronary bypass graft: Secondary | ICD-10-CM | POA: Insufficient documentation

## 2018-11-01 DIAGNOSIS — I2510 Atherosclerotic heart disease of native coronary artery without angina pectoris: Secondary | ICD-10-CM | POA: Insufficient documentation

## 2018-11-01 DIAGNOSIS — Z79899 Other long term (current) drug therapy: Secondary | ICD-10-CM | POA: Insufficient documentation

## 2018-11-01 DIAGNOSIS — K5792 Diverticulitis of intestine, part unspecified, without perforation or abscess without bleeding: Secondary | ICD-10-CM | POA: Insufficient documentation

## 2018-11-01 DIAGNOSIS — Z7901 Long term (current) use of anticoagulants: Secondary | ICD-10-CM | POA: Diagnosis not present

## 2018-11-01 DIAGNOSIS — R109 Unspecified abdominal pain: Secondary | ICD-10-CM | POA: Diagnosis present

## 2018-11-01 DIAGNOSIS — K7689 Other specified diseases of liver: Secondary | ICD-10-CM | POA: Diagnosis not present

## 2018-11-01 LAB — CBC
HCT: 45 % (ref 39.0–52.0)
Hemoglobin: 15.4 g/dL (ref 13.0–17.0)
MCH: 32 pg (ref 26.0–34.0)
MCHC: 34.2 g/dL (ref 30.0–36.0)
MCV: 93.4 fL (ref 80.0–100.0)
PLATELETS: 218 10*3/uL (ref 150–400)
RBC: 4.82 MIL/uL (ref 4.22–5.81)
RDW: 13.2 % (ref 11.5–15.5)
WBC: 15.1 10*3/uL — ABNORMAL HIGH (ref 4.0–10.5)
nRBC: 0 % (ref 0.0–0.2)

## 2018-11-01 LAB — COMPREHENSIVE METABOLIC PANEL
ALBUMIN: 3.5 g/dL (ref 3.5–5.0)
ALK PHOS: 64 U/L (ref 38–126)
ALT: 28 U/L (ref 0–44)
ANION GAP: 12 (ref 5–15)
AST: 23 U/L (ref 15–41)
BILIRUBIN TOTAL: 1.3 mg/dL — AB (ref 0.3–1.2)
BUN: 16 mg/dL (ref 8–23)
CALCIUM: 9.1 mg/dL (ref 8.9–10.3)
CO2: 24 mmol/L (ref 22–32)
Chloride: 100 mmol/L (ref 98–111)
Creatinine, Ser: 0.89 mg/dL (ref 0.61–1.24)
GFR calc Af Amer: >60 mL/min (ref >60)
GFR calc non Af Amer: >60 mL/min (ref >60)
GLUCOSE: 113 mg/dL — AB (ref 70–99)
Potassium: 3.7 mmol/L (ref 3.5–5.1)
Sodium: 136 mmol/L (ref 135–145)
TOTAL PROTEIN: 7.1 g/dL (ref 6.5–8.1)

## 2018-11-01 LAB — LIPASE, BLOOD: Lipase: 18 U/L (ref 11–51)

## 2018-11-01 MED ORDER — IOHEXOL 300 MG/ML  SOLN
100.0000 mL | Freq: Once | INTRAMUSCULAR | Status: AC | PRN
  Administered 2018-11-01: 100 mL via INTRAVENOUS

## 2018-11-01 MED ORDER — MORPHINE SULFATE (PF) 4 MG/ML IV SOLN
4.0000 mg | Freq: Once | INTRAVENOUS | Status: AC
  Administered 2018-11-01: 4 mg via INTRAVENOUS
  Filled 2018-11-01: qty 1

## 2018-11-01 MED ORDER — ONDANSETRON HCL 4 MG/2ML IJ SOLN
4.0000 mg | Freq: Once | INTRAMUSCULAR | Status: AC
  Administered 2018-11-01: 4 mg via INTRAVENOUS
  Filled 2018-11-01: qty 2

## 2018-11-01 MED ORDER — METRONIDAZOLE 500 MG PO TABS: 500.0000 mg | ORAL_TABLET | Freq: Three times a day (TID) | ORAL | 0 refills | Status: AC

## 2018-11-01 MED ORDER — SODIUM CHLORIDE 0.9 % IV BOLUS
500.0000 mL | Freq: Once | INTRAVENOUS | Status: AC
  Administered 2018-11-01: 500 mL via INTRAVENOUS

## 2018-11-01 MED ORDER — CIPROFLOXACIN HCL 500 MG PO TABS: 500.0000 mg | ORAL_TABLET | Freq: Two times a day (BID) | ORAL | 0 refills | Status: AC

## 2018-11-01 NOTE — ED Triage Notes (Signed)
Pt reports lower abdominal pain that has been ongoing X2 days. Reports nausea but denies vomiting or diarrhea.

## 2018-11-01 NOTE — ED Provider Notes (Signed)
[facility] EMERGENCY DEPARTMENT Provider Note   CSN: [id number] Arrival date & time: 11/01/18  1148     History   Chief Complaint Chief Complaint  Patient presents with  . Abdominal Pain    HPI [name]. is a 72 y.o. male.  72 y.o male with a PMH of CAD, HTN, DDD, NSTEMI presents to the ED with a chief complaint of abdominal pain x 3 days. He describes this pain as sharp originating on the left lower quadrant and now has radiated across his lower abdomen. He also endorses some nausea. He has not tried any therapy for relieve in symptoms. He also reports anorexia for the past 3 days. According to wife patient, did not leave his bed yesterday which unlike for him.Hsi last bowel movement was yesterday and it was regular. He denies any chest pain, fever, shortness of breath, vomiting or diarrhea.   The history is provided by the patient and the spouse.  Abdominal Pain   Associated symptoms include nausea. Pertinent negatives include fever, diarrhea, vomiting, constipation, dysuria and myalgias.    Past Medical History:  Diagnosis Date  . Anginal pain ([place])   . Arthritis    "in my back and neck" (09-21-12)  . CAD, CABG X 09 Nov 2011. re-do CABG X1 09/19/12 12/04/2011  . Coronary artery disease   . DDD (degenerative disc disease), cervical   . Hypercholesterolemia   . Hypertension   . Malaria    "I've had it a couple times in the 1960's; almost died from it" (September 21, 2012)  . NSTEMI (non-ST elevated myocardial infarction) ([place]) 11/2011   anterior  . NSTEMI (non-ST elevated myocardial infarction) ([place]) 09-21-12  . PTSD (post-traumatic stress disorder)   . PTSD (post-traumatic stress disorder)   . S/P angioplasty with stent,  10/31/12 VG to OM that was new graft from 11/13 in setting of NSTEMI 05/05/2013  . Seizures ([place])    1 TIME  . Vertebro basilar insufficiency 10/03/2017    Patient Active Problem List   Diagnosis Date Noted  . Internal carotid  aneurysm 01/30/2018  . Vertebro basilar insufficiency 10/04/2017  . 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 Vertebral artery insufficiency 10/04/2017  . Coronary artery disease 02/16/2014  . Abnormal nuclear stress test 05/16/2013  . Angina effort 05/05/2013  . S/P angioplasty with stent,  10/31/12 VG to OM that was new graft from 11/13 in setting of NSTEMI, NOW 05/16/13 with New DES stent to OM VG of ostium and distal portion 05/05/2013  . Stress disorder, post traumatic, from experiences in [place], controlled 05/05/2013  . Stable angina ([place]) 04/22/2013  . Dyspnea on exertion 03/25/2013  . Anxiety as acute reaction to exceptional stress 10/30/2012    Class: Question of  . Hx of agent Orange exposure, he is followed at the [facility] 10/24/2012  . Anemia, heme negative 10/06/12 10/06/2012  . Acute coronary syndrome  10/05/2012  . Acute diastolic HF (heart failure) - likely cause of ACS; resolved 10/05/2012  . Dyslipidemia 09/20/2012  . BPH (benign prostatic hyperplasia) 09/20/2012  . Nasal sinus congestion 09/13/2012  . NSTEMI, 3d episode this month 09/21/2012  . Abnormal EKG, marked diffuse downsloping ST depression,ST elevation AVR and V1 12/05/2011  . Acute MI,( only one POC marker drawn-0.9) 12/05/2011  . Former smoker 12/05/2011  . HTN (hypertension) 12/05/2011  . ICM, EF 40% Jan 2013,  50-55% Oct 2013,  40-45% with apical infarct 10/18/12 12/05/2011  . S/P CABG x 3 11/2011: LIMA  TO LAD, SVG TO OM, SVG TO RCA., VG to OM occluded, jeopardized LCX, by cath 08/29/12 -- Re-Do CABG with SVG-OM (09/2012) 12/04/2011    Past Surgical History:  Procedure Laterality Date  . BACK SURGERY    . CARDIAC CATHETERIZATION  11/2011  . CATARACT EXTRACTION W/ INTRAOCULAR LENS  IMPLANT, BILATERAL  1990's  . CORONARY ANGIOPLASTY  10/2012   stent placed SVG to OM   . CORONARY ARTERY BYPASS GRAFT  12/02/2011   Procedure: CORONARY ARTERY BYPASS GRAFTING (CABG);  Surgeon: Gaye Pollack, MD;  Location:  [facility];  Service: Open Heart Surgery;  Laterality: N/A;  . CORONARY ARTERY BYPASS GRAFT  09/19/2012   Procedure: REDO CORONARY ARTERY BYPASS GRAFTING (CABG);  Surgeon: Gaye Pollack, MD;  Location: [facility];  Service: Open Heart Surgery;  Laterality: N/A;  . IR ANGIO INTRA EXTRACRAN SEL COM CAROTID INNOMINATE BILAT MOD SED  10/05/2017  . IR ANGIO VERTEBRAL SEL VERTEBRAL BILAT MOD SED  10/05/2017  . IR INTRAVSC STENT CERV CAROTID W/O EMB-PROT MOD SED INC ANGIO  01/30/2018  . IR RADIOLOGIST EVAL & MGMT  10/24/2017  . LEFT HEART CATHETERIZATION WITH CORONARY ANGIOGRAM N/A 12/02/2011   Procedure: LEFT HEART CATHETERIZATION WITH CORONARY ANGIOGRAM;  Surgeon: Lorretta Harp, MD;  Location: [facility] CATH LAB;  Service: Cardiovascular;  Laterality: N/A;  . LEFT HEART CATHETERIZATION WITH CORONARY ANGIOGRAM N/A 08/30/2012   Procedure: LEFT HEART CATHETERIZATION WITH CORONARY ANGIOGRAM;  Surgeon: Troy Sine, MD;  Location: [facility] CATH LAB;  Service: Cardiovascular;  Laterality: N/A;  . LEFT HEART CATHETERIZATION WITH CORONARY ANGIOGRAM N/A 09/11/2012   Procedure: LEFT HEART CATHETERIZATION WITH CORONARY ANGIOGRAM;  Surgeon: Troy Sine, MD;  Location: [facility] CATH LAB;  Service: Cardiovascular;  Laterality: N/A;  . LEFT HEART CATHETERIZATION WITH CORONARY ANGIOGRAM N/A 05/16/2013   Procedure: LEFT HEART CATHETERIZATION WITH CORONARY ANGIOGRAM;  Surgeon: Lorretta Harp, MD;  Location[facility] CATH LAB;  Service: Cardiovascular;  Laterality: N/A;  . LEFT HEART CATHETERIZATION WITH CORONARY/GRAFT ANGIOGRAM  08/30/2012   Procedure: LEFT HEART CATHETERIZATION WITH Beatrix Fetters;  Surgeon: Troy Sine, MD;  Location: [facility] CATH LAB;  Service: Cardiovascular;;  . LEFT HEART CATHETERIZATION WITH CORONARY/GRAFT ANGIOGRAM N/A 10/04/2012   Procedure: LEFT HEART CATHETERIZATION WITH Beatrix Fetters;  Surgeon: Leonie Man, MD;  Location: [facility] CATH LAB;  Service: Cardiovascular;  Laterality: N/A;  . LEFT HEART  CATHETERIZATION WITH CORONARY/GRAFT ANGIOGRAM N/A 10/17/2012   Procedure: LEFT HEART CATHETERIZATION WITH Beatrix Fetters;  Surgeon: Leonie Man, MD;  Location: [facility] CATH LAB;  Service: Cardiovascular;  Laterality: N/A;  . LUMBAR DISC SURGERY  1980's?  . PERCUTANEOUS CORONARY STENT INTERVENTION (PCI-S) N/A 10/31/2012   Procedure: PERCUTANEOUS CORONARY STENT INTERVENTION (PCI-S);  Surgeon: Lorretta Harp, MD;  Location: [facility] CATH LAB;  Service: Cardiovascular;  Laterality: N/A;  . RADIOLOGY WITH ANESTHESIA N/A 12/03/2017   Procedure: embolization;  Surgeon: Luanne Bras, MD;  Location: [facility];  Service: Radiology;  Laterality: N/A;  . RADIOLOGY WITH ANESTHESIA N/A 01/30/2018   Procedure: EMBOLIZATION;  Surgeon: Luanne Bras, MD;  Location: [facility];  Service: Radiology;  Laterality: N/A;  . TONSILLECTOMY     "as a kid" (08/29/2012)  . TRANSTHORACIC ECHOCARDIOGRAM  10/18/2012   LV EF 40%- 45%   . US ECHOCARDIOGRAPHY  02/29/2012        Home Medications    Prior to Admission medications   Medication Sig Start Date End Date Taking? Authorizing Provider  amLODipine (NORVASC) 5 MG tablet Take  1 tablet (5 mg total) by mouth daily. 10/25/12   Erlene Quan, PA-C  aspirin 325 MG EC tablet Take 1 tablet (325 mg total) by mouth daily. 10/06/17   Kayleen Memos, DO  atorvastatin (LIPITOR) 80 MG tablet Take 1 tablet (80 mg total) by mouth daily. 10/10/17   Lorretta Harp, MD  carvedilol (COREG) 3.125 MG tablet Take 3.125 mg by mouth 2 (two) times daily with a meal.    [provider]  ciprofloxacin (CIPRO) 500 MG tablet Take 1 tablet (500 mg total) by mouth 2 (two) times daily for 10 days. 11/01/18 11/11/18  Janeece Fitting, PA-C  clopidogrel (PLAVIX) 75 MG tablet Take 1 tablet (75 mg total) by mouth daily. 01/31/18   Ascencion Dike, PA-C  ferrous sulfate 325 (65 FE) MG tablet Take 1 tablet (325 mg total) by mouth 3 (three) times daily with meals. Patient taking differently: Take  325 mg by mouth 2 (two) times daily with a meal.  10/25/12   Kilroy, Doreene Burke, PA-C  finasteride (PROSCAR) 5 MG tablet Take 5 mg by mouth daily.    [provider]  hydrochlorothiazide (HYDRODIURIL) 25 MG tablet Take 25 mg by mouth daily.    [provider]  ibuprofen (ADVIL,MOTRIN) 200 MG tablet Take 400 mg by mouth every 6 (six) hours as needed for mild pain.    [provider]  isosorbide mononitrate (IMDUR) 30 MG 24 hr tablet Take 1 tablet (30 mg total) by mouth daily. 04/23/13   Croitoru, Mihai, MD  lidocaine (LIDODERM) 5 % Place 1 patch onto the skin daily as needed (for pain). Remove & Discard patch within 12 hours or as directed by MD     [provider]  metroNIDAZOLE (FLAGYL) 500 MG tablet Take 1 tablet (500 mg total) by mouth 3 (three) times daily for 10 days. 11/01/18 11/11/18  Janeece Fitting, PA-C  nitroGLYCERIN (NITROSTAT) 0.4 MG SL tablet Place 1 tablet (0.4 mg total) under the tongue every 5 (five) minutes x 3 doses as needed for chest pain. 10/07/12   Brett Canales, PA-C  omeprazole (PRILOSEC) 20 MG capsule Take 20 mg by mouth 2 (two) times daily.    [provider]  potassium chloride SA (K-DUR,KLOR-CON) 20 MEQ tablet Take 20 mEq by mouth 2 (two) times daily.    [provider]  terazosin (HYTRIN) 10 MG capsule Take 10 mg by mouth at bedtime.    [provider]    Family History Family History  Problem Relation Age of Onset  . Heart disease Mother   . Diabetes Mother   . Heart disease Father   . Diabetes Father   . Healthy Sister   . Diabetes Brother   . Diabetes Sister   . Diabetes Brother     Social History Social History   Tobacco Use  . Smoking status: Former Smoker    Packs/day: 0.50    Years: 50.00    Pack years: 25.00    Types: Cigarettes    Last attempt to quit: 12/02/2011    Years since quitting: 6.9  . Smokeless tobacco: Never Used  . Tobacco comment: NIC PATCH  Substance Use Topics  . Alcohol  use: Yes    Comment: RARE  . Drug use: No     Allergies   Patient has no known allergies.   Review of Systems Review of Systems  Constitutional: Negative for fever.  HENT: Negative for rhinorrhea and sore throat.   Respiratory: Negative  for shortness of breath.   Cardiovascular: Negative for chest pain.  Gastrointestinal: Positive for abdominal pain (lower) and nausea. Negative for blood in stool, constipation, diarrhea and vomiting.  Genitourinary: Negative for dysuria and flank pain.  Musculoskeletal: Negative for back pain and myalgias.  All other systems reviewed and are negative.    Physical Exam Updated Vital Signs BP (!) 148/65   Pulse (!) 57   Temp 98.5 F (36.9 C) (Oral)   Resp 13   SpO2 99%   Physical Exam Vitals signs and nursing note reviewed.  Constitutional:      Appearance: He is well-developed. He is not ill-appearing, toxic-appearing or diaphoretic.     Comments: Uncomfortably lying on bed resting.  HENT:     Head: Normocephalic and atraumatic.  Eyes:     General: No scleral icterus.    Pupils: Pupils are equal, round, and reactive to light.  Neck:     Musculoskeletal: Normal range of motion.  Cardiovascular:     Heart sounds: Normal heart sounds.  Pulmonary:     Effort: Pulmonary effort is normal.     Breath sounds: Normal breath sounds. No wheezing.  Chest:     Chest wall: No tenderness.  Abdominal:     General: Bowel sounds are decreased. There is distension.     Palpations: Abdomen is soft.     Tenderness: There is abdominal tenderness in the right lower quadrant, suprapubic area and left lower quadrant. There is guarding. There is no rebound. Positive signs include Murphy's sign.  Musculoskeletal:        General: No tenderness or deformity.  Skin:    General: Skin is warm and dry.  Neurological:     Mental Status: He is alert and oriented to person, place, and time.      ED Treatments / Results  Labs (all labs ordered are  listed, but only abnormal results are displayed) Labs Reviewed  COMPREHENSIVE METABOLIC PANEL - Abnormal; Notable for the following components:      Result Value   Glucose, Bld 113 (*)    Total Bilirubin 1.3 (*)    All other components within normal limits  CBC - Abnormal; Notable for the following components:   WBC 15.1 (*)    All other components within normal limits  LIPASE, BLOOD  URINALYSIS, ROUTINE W REFLEX MICROSCOPIC    EKG None  Radiology Ct Abdomen Pelvis W Contrast  Result Date: 11/01/2018 CLINICAL DATA:  LOWER abdominal pain ongoing for 2 days. Nausea without vomiting or diarrhea. EXAM: CT ABDOMEN AND PELVIS WITH CONTRAST TECHNIQUE: Multidetector CT imaging of the abdomen and pelvis was performed using the standard protocol following bolus administration of intravenous contrast. CONTRAST:  164mL OMNIPAQUE IOHEXOL 300 MG/ML  SOLN COMPARISON:  CT of the abdomen and pelvis on 03/09/2014 FINDINGS: Lower chest: The heart is mildly enlarged. Status post median sternotomy. Hepatobiliary: Small low-attenuation lesions within the liver are consistent with cysts. The gallbladder is present. Pancreas: Unremarkable. No pancreatic ductal dilatation or surrounding inflammatory changes. Spleen: Normal in size without focal abnormality. Adrenals/Urinary Tract: In appearance LEFT adrenal gland. 2.6 centimeter low-attenuation lesion within the RIGHT adrenal gland is stable since 2006 and consistent with benign adenoma. Numerous low-attenuation lesions within the kidneys are consistent with cysts. There is no hydronephrosis. The ureters are normal in appearance. The bladder and visualized portion of the urethra are normal. Stomach/Bowel: Small hiatal hernia. The stomach is otherwise normal. Small bowel loops are normal in appearance. The appendix is normal  in caliber. The tip of the appendix extends into the LEFT LOWER QUADRANT and is adjacent to inflammatory changes. There is significant wall  thickening and associated inflammation in the sigmoid colon, consistent with acute diverticulitis. No evidence for perforation or abscess. No free intraperitoneal air. Moderate stool within the rectal vault. Vascular/Lymphatic: There is dense atherosclerotic calcification of the abdominal aorta, not associated with aneurysm. Reproductive: Prostate is enlarged. Other: None Musculoskeletal: There is moderate degenerative change within the LOWER thoracic and lumbar spine. Stable well-circumscribed low-attenuation lesion within the LEFT iliac wing, adjacent to the SI joint appears stable over numerous prior studies and is consistent with benign process. IMPRESSION: 1. Findings consistent with acute sigmoid diverticulitis. No abscess or perforation. 2. Mild cardiomegaly. 3. Bilateral renal and hepatic cysts. 4.  Aortic atherosclerosis.  (ICD10-I70.0) 5. Benign RIGHT adrenal adenoma. 6. Small hiatal hernia. Electronically Signed   By: Nolon Nations M.D.   On: 11/01/2018 15:40    Procedures Procedures (including critical care time)  Medications Ordered in ED Medications  sodium chloride 0.9 % bolus 500 mL (0 mLs Intravenous Stopped 11/01/18 1551)  ondansetron (ZOFRAN) injection 4 mg (4 mg Intravenous Given 11/01/18 1329)  morphine 4 MG/ML injection 4 mg (4 mg Intravenous Given 11/01/18 1331)  iohexol (OMNIPAQUE) 300 MG/ML solution 100 mL (100 mLs Intravenous Contrast Given 11/01/18 1515)     Initial Impression / Assessment and Plan / ED Course  I have reviewed the triage vital signs and the nursing notes.  Pertinent labs & imaging results that were available during my care of the patient were reviewed by me and considered in my medical decision making (see chart for details).     Patient with abdominal pain x 3 days, previous history of diverticulitis. CBC showed slight leukocytosis, hemoglobin is stable. CMP showed no electrolyte abnormality. Bili is 1.3, Patient describes the pain along lower  abdomen. Due to white count and previous history of diverticulitis will obtain a CT Abdomen and pelvis to r/o diverticulitis vs appendicitis.   CT Abdomen and pelvis showed: 1. Findings consistent with acute sigmoid diverticulitis. No abscess  or perforation.  2. Mild cardiomegaly.  3. Bilateral renal and hepatic cysts.  4. Aortic atherosclerosis. (ICD10-I70.0)  5. Benign RIGHT adrenal adenoma.  6. Small hiatal hernia.     He will be provided with outpatient antibiotics, along with pain management.  Final Clinical Impressions(s) / ED Diagnoses   Final diagnoses:  Diverticulitis    ED Discharge Orders         Ordered    metroNIDAZOLE (FLAGYL) 500 MG tablet  3 times daily     11/01/18 1552    ciprofloxacin (CIPRO) 500 MG tablet  2 times daily     11/01/18 1552           Janeece Fitting, PA-C 11/01/18 1557    Mesner, Corene Cornea, MD 11/01/18 1659

## 2018-11-01 NOTE — ED Notes (Signed)
Pt aware of need for urine sample, urinal at bedside 

## 2018-11-01 NOTE — ED Notes (Signed)
Pt still not able to provide urine sample, getting IV fluids at this time, urinal at bedside

## 2018-11-01 NOTE — Discharge Instructions (Addendum)
I have prescribed medication to treat your diverticulitis, please take both antibiotics as instructed for the next 10 days. Please return to the ED if your symptoms worsen, fever, or rectal bleeding.

## 2018-11-02 ENCOUNTER — Emergency Department (HOSPITAL_COMMUNITY)
Admission: EM | Admit: 2018-11-02 | Discharge: 2018-11-02 | Disposition: A | Discharge status: Home / Self Care | Payer: Non-veteran care | Attending: Emergency Medicine | Admitting: Emergency Medicine

## 2018-11-02 DIAGNOSIS — I10 Essential (primary) hypertension: Secondary | ICD-10-CM | POA: Insufficient documentation

## 2018-11-02 DIAGNOSIS — I252 Old myocardial infarction: Secondary | ICD-10-CM | POA: Diagnosis not present

## 2018-11-02 DIAGNOSIS — I110 Hypertensive heart disease with heart failure: Secondary | ICD-10-CM | POA: Diagnosis not present

## 2018-11-02 DIAGNOSIS — Z951 Presence of aortocoronary bypass graft: Secondary | ICD-10-CM | POA: Insufficient documentation

## 2018-11-02 DIAGNOSIS — K5792 Diverticulitis of intestine, part unspecified, without perforation or abscess without bleeding: Secondary | ICD-10-CM | POA: Diagnosis not present

## 2018-11-02 DIAGNOSIS — I5030 Unspecified diastolic (congestive) heart failure: Secondary | ICD-10-CM | POA: Insufficient documentation

## 2018-11-02 DIAGNOSIS — I2510 Atherosclerotic heart disease of native coronary artery without angina pectoris: Secondary | ICD-10-CM | POA: Insufficient documentation

## 2018-11-02 DIAGNOSIS — Z87891 Personal history of nicotine dependence: Secondary | ICD-10-CM | POA: Diagnosis not present

## 2018-11-02 DIAGNOSIS — R1032 Left lower quadrant pain: Secondary | ICD-10-CM | POA: Diagnosis present

## 2018-11-02 LAB — COMPREHENSIVE METABOLIC PANEL
ALBUMIN: 3.2 g/dL — AB (ref 3.5–5.0)
ALT: 26 U/L (ref 0–44)
AST: 19 U/L (ref 15–41)
Alkaline Phosphatase: 62 U/L (ref 38–126)
Anion gap: 9 (ref 5–15)
BUN: 22 mg/dL (ref 8–23)
CHLORIDE: 100 mmol/L (ref 98–111)
CO2: 27 mmol/L (ref 22–32)
CREATININE: 0.87 mg/dL (ref 0.61–1.24)
Calcium: 8.8 mg/dL — ABNORMAL LOW (ref 8.9–10.3)
GFR calc Af Amer: >60 mL/min (ref >60)
GLUCOSE: 111 mg/dL — AB (ref 70–99)
POTASSIUM: 3.3 mmol/L — AB (ref 3.5–5.1)
Sodium: 136 mmol/L (ref 135–145)
Total Bilirubin: 0.8 mg/dL (ref 0.3–1.2)
Total Protein: 6.6 g/dL (ref 6.5–8.1)

## 2018-11-02 LAB — CBC WITH DIFFERENTIAL/PLATELET
ABS IMMATURE GRANULOCYTES: 0.12 10*3/uL — AB (ref 0.00–0.07)
BASOS ABS: 0 10*3/uL (ref 0.0–0.1)
BASOS PCT: 0 %
EOS ABS: 0 10*3/uL (ref 0.0–0.5)
Eosinophils Relative: 0 %
HCT: 41.1 % (ref 39.0–52.0)
Hemoglobin: 13.9 g/dL (ref 13.0–17.0)
Immature Granulocytes: 1 %
LYMPHS ABS: 0.9 10*3/uL (ref 0.7–4.0)
Lymphocytes Relative: 6 %
MCH: 32 pg (ref 26.0–34.0)
MCHC: 33.8 g/dL (ref 30.0–36.0)
MCV: 94.7 fL (ref 80.0–100.0)
MONOS PCT: 11 %
Monocytes Absolute: 1.6 10*3/uL — ABNORMAL HIGH (ref 0.1–1.0)
NEUTROS ABS: 12.3 10*3/uL — AB (ref 1.7–7.7)
NRBC: 0 % (ref 0.0–0.2)
Neutrophils Relative %: 82 %
PLATELETS: 194 10*3/uL (ref 150–400)
RBC: 4.34 MIL/uL (ref 4.22–5.81)
RDW: 13.3 % (ref 11.5–15.5)
WBC: 15 10*3/uL — ABNORMAL HIGH (ref 4.0–10.5)

## 2018-11-02 LAB — URINALYSIS, ROUTINE W REFLEX MICROSCOPIC
GLUCOSE, UA: NEGATIVE mg/dL
Nitrite: POSITIVE — AB
PH: 5.5 (ref 5.0–8.0)
Protein, ur: 30 mg/dL — AB

## 2018-11-02 LAB — URINALYSIS, MICROSCOPIC (REFLEX): Squamous Epithelial / HPF: NONE SEEN (ref 0–5)

## 2018-11-02 MED ORDER — PROMETHAZINE HCL 25 MG/ML IJ SOLN
12.5000 mg | Freq: Once | INTRAMUSCULAR | Status: AC
  Administered 2018-11-02: 12.5 mg via INTRAVENOUS
  Filled 2018-11-02: qty 1

## 2018-11-02 MED ORDER — PROMETHAZINE HCL 25 MG PO TABS: 25.0000 mg | ORAL_TABLET | Freq: Four times a day (QID) | ORAL | 0 refills | Status: DC | PRN

## 2018-11-02 MED ORDER — LACTATED RINGERS IV BOLUS
1000.0000 mL | Freq: Once | INTRAVENOUS | Status: AC
  Administered 2018-11-02: 1000 mL via INTRAVENOUS

## 2018-11-02 MED ORDER — OXYCODONE-ACETAMINOPHEN 5-325 MG PO TABS: 1.0000 | ORAL_TABLET | Freq: Four times a day (QID) | ORAL | 0 refills | Status: DC | PRN

## 2018-11-02 MED ORDER — METRONIDAZOLE IN NACL 5-0.79 MG/ML-% IV SOLN
500.0000 mg | Freq: Once | INTRAVENOUS | Status: AC
  Administered 2018-11-02: 500 mg via INTRAVENOUS
  Filled 2018-11-02: qty 100

## 2018-11-02 MED ORDER — HYDROMORPHONE HCL 1 MG/ML IJ SOLN
1.0000 mg | Freq: Once | INTRAMUSCULAR | Status: AC
  Administered 2018-11-02: 1 mg via INTRAVENOUS
  Filled 2018-11-02: qty 1

## 2018-11-02 MED ORDER — DOCUSATE SODIUM 100 MG PO CAPS: 100.0000 mg | ORAL_CAPSULE | Freq: Two times a day (BID) | ORAL | 0 refills | Status: DC

## 2018-11-02 MED ORDER — OXYCODONE-ACETAMINOPHEN 5-325 MG PO TABS
1.0000 | ORAL_TABLET | Freq: Once | ORAL | Status: AC
  Administered 2018-11-02: 1 via ORAL
  Filled 2018-11-02: qty 1

## 2018-11-02 MED ORDER — CIPROFLOXACIN IN D5W 400 MG/200ML IV SOLN
400.0000 mg | Freq: Once | INTRAVENOUS | Status: AC
  Administered 2018-11-02: 400 mg via INTRAVENOUS
  Filled 2018-11-02: qty 200

## 2018-11-02 NOTE — ED Triage Notes (Signed)
Pt endorses LLQ pain x 2 days. Seen here yesterday and told he had diverticulosis and d/c home. Pt still having pain and states now that he thinks he has blood in his urine. VSS

## 2018-11-02 NOTE — ED Notes (Signed)
Got patient undressed on the monitor patient is resting with call bell in reach 

## 2018-11-02 NOTE — ED Notes (Signed)
Pt verbalized understanding of discharge instructions, prescriptions, and follow-up care.

## 2018-11-02 NOTE — ED Provider Notes (Signed)
Emergency Department Provider Note   I have reviewed the triage vital signs and the nursing notes.   HISTORY  Chief Complaint Abdominal Pain   HPI [name]. is a 72 y.o. male who presents to the emergency department today with continued abdominal pain.  Patient states that yesterday he was here for abdominal pain was found a diverticulitis start antibiotics however the pain is persisted and he was not discharged with any pain medication.  Patient states that the pain seems to be about the same.  Morphine yesterday did not seem to help at all.  He is also had worsening nausea with no vomiting.  Not even tried to eat or drink anything since then.  Has had dark and decreased urine as well. No other associated or modifying symptoms.    Past Medical History:  Diagnosis Date  . Anginal pain ([place])   . Arthritis    "in my back and neck" (2012/09/10)  . CAD, CABG X 09 Nov 2011. re-do CABG X1 09/19/12 12/04/2011  . Coronary artery disease   . DDD (degenerative disc disease), cervical   . Hypercholesterolemia   . Hypertension   . Malaria    "I've had it a couple times in the 1960's; almost died from it" (09-10-2012)  . NSTEMI (non-ST elevated myocardial infarction) ([place]) 11/2011   anterior  . NSTEMI (non-ST elevated myocardial infarction) ([place]) 09-10-2012  . PTSD (post-traumatic stress disorder)   . PTSD (post-traumatic stress disorder)   . S/P angioplasty with stent,  10/31/12 VG to OM that was new graft from 11/13 in setting of NSTEMI 05/05/2013  . Seizures ([place])    1 TIME  . Vertebro basilar insufficiency 10/03/2017    Patient Active Problem List   Diagnosis Date Noted  . Internal carotid aneurysm 01/30/2018  . Vertebro basilar insufficiency 10/04/2017  . Vertebral artery dissection ([place]) 10/04/2017  . Vertebral artery insufficiency 10/04/2017  . Coronary artery disease 02/16/2014  . Abnormal nuclear stress test 05/16/2013  . Angina effort 05/05/2013  . S/P  angioplasty with stent,  10/31/12 VG to OM that was new graft from 11/13 in setting of NSTEMI, NOW 05/16/13 with New DES stent to OM VG of ostium and distal portion 05/05/2013  . Stress disorder, post traumatic, from experiences in [place], controlled 05/05/2013  . Stable angina ([place]) 04/22/2013  . Dyspnea on exertion 03/25/2013  . Anxiety as acute reaction to exceptional stress 10/30/2012    Class: Question of  . Hx of agent Orange exposure, he is followed at the [facility] 10/24/2012  . Anemia, heme negative 10/06/12 10/06/2012  . Acute coronary syndrome  10/05/2012  . Acute diastolic HF (heart failure) - likely cause of ACS; resolved 10/05/2012  . Dyslipidemia 09/20/2012  . BPH (benign prostatic hyperplasia) 09/20/2012  . Nasal sinus congestion 09/13/2012  . NSTEMI, 3d episode this month 09-10-2012  . Abnormal EKG, marked diffuse downsloping ST depression,ST elevation AVR and V1 12/05/2011  . Acute MI,( only one POC marker drawn-0.9) 12/05/2011  . Former smoker 12/05/2011  . HTN (hypertension) 12/05/2011  . ICM, EF 40% Jan 2013,  50-55% Oct 2013,  40-45% with apical infarct 10/18/12 12/05/2011  . S/P CABG x 3 11/2011: LIMA TO LAD, SVG TO OM, SVG TO RCA., VG to OM occluded, jeopardized LCX, by cath 09-10-2012 -- Re-Do CABG with SVG-OM (09/2012) 12/04/2011    Past Surgical History:  Procedure Laterality Date  . BACK SURGERY    . CARDIAC CATHETERIZATION  11/2011  . CATARACT  EXTRACTION W/ INTRAOCULAR LENS  IMPLANT, BILATERAL  1990's  . CORONARY ANGIOPLASTY  10/2012   stent placed SVG to OM   . CORONARY ARTERY BYPASS GRAFT  12/02/2011   Procedure: CORONARY ARTERY BYPASS GRAFTING (CABG);  Surgeon: Gaye Pollack, MD;  Location: [place];  Service: Open Heart Surgery;  Laterality: N/A;  . CORONARY ARTERY BYPASS GRAFT  09/19/2012   Procedure: REDO CORONARY ARTERY BYPASS GRAFTING (CABG);  Surgeon: Gaye Pollack, MD;  Location: [place];  Service: Open Heart Surgery;  Laterality: N/A;  . IR ANGIO INTRA  EXTRACRAN SEL COM CAROTID INNOMINATE BILAT MOD SED  10/05/2017  . IR ANGIO VERTEBRAL SEL VERTEBRAL BILAT MOD SED  10/05/2017  . IR INTRAVSC STENT CERV CAROTID W/O EMB-PROT MOD SED INC ANGIO  01/30/2018  . IR RADIOLOGIST EVAL & MGMT  10/24/2017  . LEFT HEART CATHETERIZATION WITH CORONARY ANGIOGRAM N/A 12/02/2011   Procedure: LEFT HEART CATHETERIZATION WITH CORONARY ANGIOGRAM;  Surgeon: Lorretta Harp, MD;  Location: [facility] CATH LAB;  Service: Cardiovascular;  Laterality: N/A;  . LEFT HEART CATHETERIZATION WITH CORONARY ANGIOGRAM N/A 08/30/2012   Procedure: LEFT HEART CATHETERIZATION WITH CORONARY ANGIOGRAM;  Surgeon: Troy Sine, MD;  Location: [facility] CATH LAB;  Service: Cardiovascular;  Laterality: N/A;  . LEFT HEART CATHETERIZATION WITH CORONARY ANGIOGRAM N/A 09/11/2012   Procedure: LEFT HEART CATHETERIZATION WITH CORONARY ANGIOGRAM;  Surgeon: Troy Sine, MD;  Location: [facility] CATH LAB;  Service: Cardiovascular;  Laterality: N/A;  . LEFT HEART CATHETERIZATION WITH CORONARY ANGIOGRAM N/A 05/16/2013   Procedure: LEFT HEART CATHETERIZATION WITH CORONARY ANGIOGRAM;  Surgeon: Lorretta Harp, MD;  Location: [facility] CATH LAB;  Service: Cardiovascular;  Laterality: N/A;  . LEFT HEART CATHETERIZATION WITH CORONARY/GRAFT ANGIOGRAM  08/30/2012   Procedure: LEFT HEART CATHETERIZATION WITH Beatrix Fetters;  Surgeon: Troy Sine, MD;  Location: [facility] CATH LAB;  Service: Cardiovascular;;  . LEFT HEART CATHETERIZATION WITH CORONARY/GRAFT ANGIOGRAM N/A 10/04/2012   Procedure: LEFT HEART CATHETERIZATION WITH Beatrix Fetters;  Surgeon: Leonie Man, MD;  Location: [facility] CATH LAB;  Service: Cardiovascular;  Laterality: N/A;  . LEFT HEART CATHETERIZATION WITH CORONARY/GRAFT ANGIOGRAM N/A 10/17/2012   Procedure: LEFT HEART CATHETERIZATION WITH Beatrix Fetters;  Surgeon: Leonie Man, MD;  Location: [facility] CATH LAB;  Service: Cardiovascular;  Laterality: N/A;  . LUMBAR DISC SURGERY  1980's?  .  PERCUTANEOUS CORONARY STENT INTERVENTION (PCI-S) N/A 10/31/2012   Procedure: PERCUTANEOUS CORONARY STENT INTERVENTION (PCI-S);  Surgeon: Lorretta Harp, MD;  Location: [facility] CATH LAB;  Service: Cardiovascular;  Laterality: N/A;  . RADIOLOGY WITH ANESTHESIA N/A 12/03/2017   Procedure: embolization;  Surgeon: Luanne Bras, MD;  Location: [place];  Service: Radiology;  Laterality: N/A;  . RADIOLOGY WITH ANESTHESIA N/A 01/30/2018   Procedure: EMBOLIZATION;  Surgeon: Luanne Bras, MD;  Location: [place];  Service: Radiology;  Laterality: N/A;  . TONSILLECTOMY     "as a kid" (08/29/2012)  . TRANSTHORACIC ECHOCARDIOGRAM  10/18/2012   LV EF 40%- 45%   . US ECHOCARDIOGRAPHY  02/29/2012    Current Outpatient Rx  . Order #: [id number] Class: Normal  . Order #: [id number] Class: Print  . Order #: [id number] Class: Print  . Order #: [id number] Class: Historical Med  . Order #: [id number] Class: Normal  . Order #: [id number] Class: No Print  . Order #: [id number] Class: Print  . Order #: [id number] Class: Normal  . Order #: [id number] Class: Historical Med  . Order #: [id number] Class: Historical Med  . Order #: [id number] Class: Historical Med  . Order #: [id number] Class: Print  .  Order #: [id number] Class: Historical Med  . Order #: [id number] Class: Normal  . Order #: [id number] Class: Normal  . Order #: [id number] Class: Historical Med  . Order #: [id number] Class: Print  . Order #: [id number] Class: Historical Med  . Order #: [id number] Class: Print  . Order #: [id number] Class: Historical Med    Allergies Patient has no known allergies.  Family History  Problem Relation Age of Onset  . Heart disease Mother   . Diabetes Mother   . Heart disease Father   . Diabetes Father   . Healthy Sister   . Diabetes Brother   . Diabetes Sister   . Diabetes Brother     Social History Social History   Tobacco Use  . Smoking status: Former Smoker    Packs/day: 0.50    Years: 50.00    Pack years: 25.00    Types: Cigarettes     Last attempt to quit: 12/02/2011    Years since quitting: 6.9  . Smokeless tobacco: Never Used  . Tobacco comment: NIC PATCH  Substance Use Topics  . Alcohol use: Yes    Comment: RARE  . Drug use: No    Review of Systems  All other systems negative except as documented in the HPI. All pertinent positives and negatives as reviewed in the HPI. ____________________________________________   PHYSICAL EXAM:  VITAL SIGNS: ED Triage Vitals  Enc Vitals Group     BP 11/02/18 1032 130/67     Pulse Rate 11/02/18 1032 70     Resp 11/02/18 1032 18     Temp 11/02/18 1032 98.2 F (36.8 C)     Temp Source 11/02/18 1032 Oral     SpO2 11/02/18 1032 100 %     Weight 11/02/18 1032 175 lb (79.4 kg)     Height 11/02/18 1032 5\' 10"  (1.778 m)     Head Circumference --      Peak Flow --      Pain Score 11/02/18 1130 10     Pain Loc --      Pain Edu? --      Excl. in [place]? --     Constitutional: Alert and oriented. Well appearing and in no acute distress. Eyes: Conjunctivae are normal. PERRL. EOMI. Head: Atraumatic. Nose: No congestion/rhinnorhea. Mouth/Throat: Mucous membranes are moist.  Oropharynx non-erythematous. Neck: No stridor.  No meningeal signs.   Cardiovascular: Normal rate, regular rhythm. Good peripheral circulation. Grossly normal heart sounds.   Respiratory: Normal respiratory effort.  No retractions. Lungs CTAB. Gastrointestinal: ttp in LLQ. No rebound. No guarding. No distention.  Musculoskeletal: No lower extremity tenderness nor edema. No gross deformities of extremities. Neurologic:  Normal speech and language. No gross focal neurologic deficits are appreciated.  Skin:  Skin is warm, dry and intact. No rash noted.   ____________________________________________   LABS (all labs ordered are listed, but only abnormal results are displayed)  Labs Reviewed  CBC WITH DIFFERENTIAL/PLATELET - Abnormal; Notable for the following components:      Result Value   WBC 15.0 (*)     Neutro Abs 12.3 (*)    Monocytes Absolute 1.6 (*)    Abs Immature Granulocytes 0.12 (*)    All other components within normal limits  COMPREHENSIVE METABOLIC PANEL - Abnormal; Notable for the following components:   Potassium 3.3 (*)    Glucose, Bld 111 (*)    Calcium 8.8 (*)    Albumin 3.2 (*)    All other components within normal limits  URINALYSIS, ROUTINE W REFLEX  MICROSCOPIC - Abnormal; Notable for the following components:   Color, Urine YELLOW (*)    APPearance CLEAR (*)    Specific Gravity, Urine >1.030 (*)    Hgb urine dipstick LARGE (*)    Bilirubin Urine SMALL (*)    Ketones, ur TRACE (*)    Protein, ur 30 (*)    Nitrite POSITIVE (*)    Leukocytes, UA TRACE (*)    All other components within normal limits  URINALYSIS, MICROSCOPIC (REFLEX) - Abnormal; Notable for the following components:   Bacteria, UA RARE (*)    All other components within normal limits   ____________________________________________  RADIOLOGY  Ct Abdomen Pelvis W Contrast  Result Date: 11/01/2018 CLINICAL DATA:  LOWER abdominal pain ongoing for 2 days. Nausea without vomiting or diarrhea. EXAM: CT ABDOMEN AND PELVIS WITH CONTRAST TECHNIQUE: Multidetector CT imaging of the abdomen and pelvis was performed using the standard protocol following bolus administration of intravenous contrast. CONTRAST:  177mL OMNIPAQUE IOHEXOL 300 MG/ML  SOLN COMPARISON:  CT of the abdomen and pelvis on 03/09/2014 FINDINGS: Lower chest: The heart is mildly enlarged. Status post median sternotomy. Hepatobiliary: Small low-attenuation lesions within the liver are consistent with cysts. The gallbladder is present. Pancreas: Unremarkable. No pancreatic ductal dilatation or surrounding inflammatory changes. Spleen: Normal in size without focal abnormality. Adrenals/Urinary Tract: In appearance LEFT adrenal gland. 2.6 centimeter low-attenuation lesion within the RIGHT adrenal gland is stable since 2006 and consistent with benign  adenoma. Numerous low-attenuation lesions within the kidneys are consistent with cysts. There is no hydronephrosis. The ureters are normal in appearance. The bladder and visualized portion of the urethra are normal. Stomach/Bowel: Small hiatal hernia. The stomach is otherwise normal. Small bowel loops are normal in appearance. The appendix is normal in caliber. The tip of the appendix extends into the LEFT LOWER QUADRANT and is adjacent to inflammatory changes. There is significant wall thickening and associated inflammation in the sigmoid colon, consistent with acute diverticulitis. No evidence for perforation or abscess. No free intraperitoneal air. Moderate stool within the rectal vault. Vascular/Lymphatic: There is dense atherosclerotic calcification of the abdominal aorta, not associated with aneurysm. Reproductive: Prostate is enlarged. Other: None Musculoskeletal: There is moderate degenerative change within the LOWER thoracic and lumbar spine. Stable well-circumscribed low-attenuation lesion within the LEFT iliac wing, adjacent to the SI joint appears stable over numerous prior studies and is consistent with benign process. IMPRESSION: 1. Findings consistent with acute sigmoid diverticulitis. No abscess or perforation. 2. Mild cardiomegaly. 3. Bilateral renal and hepatic cysts. 4.  Aortic atherosclerosis.  (ICD10-I70.0) 5. Benign RIGHT adrenal adenoma. 6. Small hiatal hernia. Electronically Signed   By: Nolon Nations M.D.   On: 11/01/2018 15:40    ____________________________________________   PROCEDURES  Procedure(s) performed:   Procedures   ____________________________________________   INITIAL IMPRESSION / ASSESSMENT AND PLAN / ED COURSE  Patient with ongoing pain from diverticulitis likely undertreated.  Will go ahead and start treating appropriately and give some IV antibiotics and nausea medications with the intent to discharge on same.  Patient with significant provement in his  pain still have some tenderness in his left lower quadrant but no evidence of peritonitis to suggest rupture or abscess.  His white blood cell count is similar to what it was yesterday but is only had 2 doses of antibiotics I do not expect that this is going to improve that quickly.  Will need close PCP follow up. Rx for pain/nausea meds given for home, will return if any  acute worsening in his symptoms occur or not improving after 2 to 3 days.  Patient is nontoxic-appearing and tolerating p.o. at time of discharge.  Pertinent labs & imaging results that were available during my care of the patient were reviewed by me and considered in my medical decision making (see chart for details).  ____________________________________________  FINAL CLINICAL IMPRESSION(S) / ED DIAGNOSES  Final diagnoses:  Diverticulitis     MEDICATIONS GIVEN DURING THIS VISIT:  Medications  HYDROmorphone (DILAUDID) injection 1 mg (1 mg Intravenous Given 11/02/18 1234)  lactated ringers bolus 1,000 mL (0 mLs Intravenous Stopped 11/02/18 1428)  promethazine (PHENERGAN) injection 12.5 mg (12.5 mg Intravenous Given 11/02/18 1235)  ciprofloxacin (CIPRO) IVPB 400 mg (0 mg Intravenous Stopped 11/02/18 1359)  metroNIDAZOLE (FLAGYL) IVPB 500 mg (0 mg Intravenous Stopped 11/02/18 1501)  oxyCODONE-acetaminophen (PERCOCET/ROXICET) 5-325 MG per tablet 1 tablet (1 tablet Oral Given 11/02/18 1435)     NEW OUTPATIENT MEDICATIONS STARTED DURING THIS VISIT:  New Prescriptions   DOCUSATE SODIUM (COLACE) 100 MG CAPSULE    Take 1 capsule (100 mg total) by mouth every 12 (twelve) hours.   OXYCODONE-ACETAMINOPHEN (PERCOCET) 5-325 MG TABLET    Take 1-2 tablets by mouth every 6 (six) hours as needed for severe pain.   PROMETHAZINE (PHENERGAN) 25 MG TABLET    Take 1 tablet (25 mg total) by mouth every 6 (six) hours as needed for nausea or vomiting.    Note:  This note was prepared with assistance of Dragon voice recognition software.  Occasional wrong-word or sound-a-like substitutions may have occurred due to the inherent limitations of voice recognition software.   Merrily Pew, MD 11/02/18 [phone]

## 2019-04-15 ENCOUNTER — Telehealth: Payer: Self-pay | Admitting: *Deleted

## 2019-04-15 NOTE — Telephone Encounter (Signed)
A message left,re: follow up visit. 

## 2020-01-09 ENCOUNTER — Encounter: Payer: Self-pay | Admitting: General Practice

## 2020-07-06 ENCOUNTER — Ambulatory Visit: Payer: Medicare Other | Admitting: Cardiovascular Disease

## 2020-07-20 ENCOUNTER — Ambulatory Visit (INDEPENDENT_AMBULATORY_CARE_PROVIDER_SITE_OTHER): Payer: Medicare PPO | Admitting: Cardiovascular Disease

## 2020-07-20 ENCOUNTER — Ambulatory Visit: Payer: Medicare Other | Admitting: Cardiovascular Disease

## 2020-07-20 ENCOUNTER — Encounter: Payer: Self-pay | Admitting: Cardiovascular Disease

## 2020-07-20 ENCOUNTER — Other Ambulatory Visit: Payer: Self-pay

## 2020-07-20 VITALS — BP 128/60 | HR 51 | Ht 70.0 in | Wt 173.8 lb

## 2020-07-20 DIAGNOSIS — I10 Essential (primary) hypertension: Secondary | ICD-10-CM | POA: Diagnosis not present

## 2020-07-20 DIAGNOSIS — E785 Hyperlipidemia, unspecified: Secondary | ICD-10-CM | POA: Diagnosis not present

## 2020-07-20 DIAGNOSIS — Z951 Presence of aortocoronary bypass graft: Secondary | ICD-10-CM | POA: Diagnosis not present

## 2020-07-20 DIAGNOSIS — I255 Ischemic cardiomyopathy: Secondary | ICD-10-CM

## 2020-07-20 LAB — HEPATIC FUNCTION PANEL
ALT: 23 IU/L (ref 0–44)
AST: 21 IU/L (ref 0–40)
Albumin: 3.8 g/dL (ref 3.7–4.7)
Alkaline Phosphatase: 90 IU/L (ref 44–121)
Bilirubin Total: 0.6 mg/dL (ref 0.0–1.2)
Bilirubin, Direct: 0.19 mg/dL (ref 0.00–0.40)
Total Protein: 6.6 g/dL (ref 6.0–8.5)

## 2020-07-20 LAB — LIPID PANEL
Chol/HDL Ratio: 2.9 ratio (ref 0.0–5.0)
Cholesterol, Total: 125 mg/dL (ref 100–199)
HDL: 43 mg/dL (ref >39)
LDL Chol Calc (NIH): 67 mg/dL (ref 0–99)
Triglycerides: 73 mg/dL (ref 0–149)
VLDL Cholesterol Cal: 15 mg/dL (ref 5–40)

## 2020-07-20 NOTE — Assessment & Plan Note (Signed)
History of ischemic cardiomyopathy remotely the EF in the 35 to 40% range however more recently echo performed 10/05/2017 revealed normal EF.  He is asymptomatic.

## 2020-07-20 NOTE — Progress Notes (Signed)
07/20/2020 [name].   1946/07/11  [id number]  [facility], [facility]ologist: [name] FACP, [place], [place], [place]  HPI:  [name]. is a 74 y.o.   mildly overweight married Caucasian male, father of 30 and grandfather to 7 grandchildren, whom I last saw in the office  05/07/2018.  He has a history of CAD, status post coronary artery bypass grafting by [name] in January of last year after a catheterization performed by me December 02, 2011, revealed left main 3-vessel disease with moderate LV dysfunction. He had a LIMA to his LAD, a vein to an OM branch and to the RCA. His postoperative course was uncomplicated. He did stop smoking at that time. His other problems include hypertension and hyperlipidemia as well as a family history of heart disease. He had recurrent episodes of unstable angina and ultimately underwent recatheterization by [name] in October of last year revealing occluded vein to the circumflex marginal branch with a total left main. He had a patent LIMA to his LAD and a patent vein to the RCA. It was presumed that his angina and non-STEMIs were related to ischemia in the circumflex territory. He ultimately underwent redo bypass surgery x1 to the circumflex obtuse marginal branch in November and returned several times after that with recurrent angina. Catheterization revealed a kink in the vessel just proximal to the distal anastomosis as well as preanastomotic lesion. I ultimately performed stenting of his newly-placed circumflex marginal vein graft on October 31, 2012, which has resulted in complete resolution of his anginal symptoms. He participated in cardiac rehab. Recently he developed recurrent chest pain. A Myoview stress test performed on 05/13/13 was read as "high risk" is remarkable for significant anterolateral and inferolateral ischemia.  I performed cardiac catheterization on him 05/16/13 revealing  high-grade in-stent restenosis" within the circumflex obtuse marginal branch SVG distal stented segment as well as at the aorto/ostium. I we stented the distal portion of the vein graft and stent at the origin as well as drug-eluting stents.he has remained asymptomatic since I saw him in the office one year ago. Unfortunately his wife a 21 years passed awayin February 2016 when I last still grieving. He is accompanied by his new fiancs [name].   He was admitted to the hospital 09/25/17 with a vertebrobasilar stroke. He apparently had a cerebral angiogram performed by Dr. Patrecia Pour 10/03/17 revealing occluded vertebral artery on the left at the C1 segment. Apparently there is plan for intervention.  Since I saw him in the office 2 years ago he is remained stable.  He denies chest pain or shortness of breath.    Current Meds  Medication Sig   amLODipine (NORVASC) 10 MG tablet Take 10 mg by mouth daily.   aspirin EC 81 MG tablet Take 81 mg by mouth daily. Swallow whole.   atorvastatin (LIPITOR) 80 MG tablet Take 1 tablet (80 mg total) by mouth daily.   carvedilol (COREG) 3.125 MG tablet Take 3.125 mg by mouth 2 (two) times daily with a meal.   clopidogrel (PLAVIX) 75 MG tablet Take 1 tablet (75 mg total) by mouth daily.   ferrous sulfate 325 (65 FE) MG tablet Take 1 tablet (325 mg total) by mouth 3 (three) times daily with meals. (Patient taking differently: Take 325 mg by mouth 2 (two) times daily with a meal. )   finasteride (PROSCAR) 5 MG tablet Take 5 mg by  mouth daily.   hydrochlorothiazide (HYDRODIURIL) 25 MG tablet Take 25 mg by mouth daily.   ibuprofen (ADVIL,MOTRIN) 200 MG tablet Take 400 mg by mouth every 6 (six) hours as needed for mild pain.   isosorbide mononitrate (IMDUR) 30 MG 24 hr tablet Take 1 tablet (30 mg total) by mouth daily.   lidocaine (LIDODERM) 5 % Place 1 patch onto the skin daily as needed (for pain). Remove & Discard patch within 12 hours or as  directed by MD    nitroGLYCERIN (NITROSTAT) 0.4 MG SL tablet Place 1 tablet (0.4 mg total) under the tongue every 5 (five) minutes x 3 doses as needed for chest pain.   omeprazole (PRILOSEC) 20 MG capsule Take 20 mg by mouth 2 (two) times daily.   terazosin (HYTRIN) 10 MG capsule Take 10 mg by mouth at bedtime.     No Known Allergies  Social History   Socioeconomic History   Marital status: Married    Spouse name: Not on file   Number of children: Not on file   Years of education: Not on file   Highest education level: Not on file  Occupational History   Not on file  Tobacco Use   Smoking status: Former Smoker    Packs/day: 0.50    Years: 50.00    Pack years: 25.00    Types: Cigarettes    Quit date: 12/02/2011    Years since quitting: 8.6   Smokeless tobacco: Never Used   Tobacco comment: NIC PATCH  Vaping Use   Vaping Use: Never used  Substance and Sexual Activity   Alcohol use: Yes    Comment: RARE   Drug use: No   Sexual activity: Yes  Other Topics Concern   Not on file  Social History Narrative   [place] [phone]. History of Agent Orange exposure, followed at [facility] Strain:    Difficulty of Paying Living Expenses: Not on file  Food Insecurity:    Worried About Charity fundraiser in the Last Year: Not on file   [employer] of Food in the Last Year: Not on file  Transportation Needs:    Lack of Transportation (Medical): Not on file   Lack of Transportation (Non-Medical): Not on file  Physical Activity:    Days of Exercise per Week: Not on file   Minutes of Exercise per Session: Not on file  Stress:    Feeling of Stress : Not on file  Social Connections:    Frequency of Communication with Friends and Family: Not on file   Frequency of Social Gatherings with Friends and Family: Not on file   Attends Religious Services: Not on file   Active Member of Clubs or Organizations: Not on  file   Attends Archivist Meetings: Not on file   Marital Status: Not on file  Intimate Partner Violence:    Fear of Current or Ex-Partner: Not on file   Emotionally Abused: Not on file   Physically Abused: Not on file   Sexually Abused: Not on file     Review of Systems: General: negative for chills, fever, night sweats or weight changes.  Cardiovascular: negative for chest pain, dyspnea on exertion, edema, orthopnea, palpitations, paroxysmal nocturnal dyspnea or shortness of breath Dermatological: negative for rash Respiratory: negative for cough or wheezing Urologic: negative for hematuria Abdominal: negative for nausea, vomiting, diarrhea, bright red blood per rectum, melena, or hematemesis Neurologic: negative  for visual changes, syncope, or dizziness All other systems reviewed and are otherwise negative except as noted above.    Blood pressure 128/60, pulse (!) 51, height 5\' 10"  (1.778 m), weight 173 lb 12.8 oz (78.8 kg), SpO2 99 %.  General appearance: alert and no distress Neck: no adenopathy, no carotid bruit, no JVD, supple, symmetrical, trachea midline and thyroid not enlarged, symmetric, no tenderness/mass/nodules Lungs: clear to auscultation bilaterally Heart: regular rate and rhythm, S1, S2 normal, no murmur, click, rub or gallop Extremities: extremities normal, atraumatic, no cyanosis or edema Pulses: 2+ and symmetric Skin: Skin color, texture, turgor normal. No rashes or lesions Neurologic: Alert and oriented X 3, normal strength and tone. Normal symmetric reflexes. Normal coordination and gait  EKG sinus bradycardia 51 with voltage criteria for LVH.  I personally reviewed this EKG.  ASSESSMENT AND PLAN:   S/P CABG x 3 11/2011: LIMA TO LAD, SVG TO OM, SVG TO RCA., VG to OM occluded, jeopardized LCX, by cath 08/29/12 -- Re-Do CABG with SVG-OM (09/2012) History of CAD status post CABG January 2013 after catheterization by myself 12/02/2011 revealed  left main/three-vessel disease with moderate LV dysfunction.  He had LIMA to his LAD, vein to an OM branch and to the RCA.  He was recath after that by Dr. Claiborne Billings revealing a patent LIMA to the LAD and a patent vein to the RCA RCA.  The circumflex vein graft was occluded.  He underwent redo CABG x1 with a vein to the circumflex obtuse marginal branch and returned several times after that with recurrent angina.  Catheterization by myself revealed kink in the vein graft just prior to the distal anastomosis which I stented and restented again for in-stent restenosis 05/16/2013.  Has been asymptomatic since.  HTN (hypertension) History of essential hypertension with blood pressure measured today 128/60.  He is on hydrochlorothiazide and carvedilol.  Dyslipidemia History of hyperlipidemia on statin therapy.  We will recheck a lipid liver profile this morning.  ICM, EF 40% Jan 2013,  50-55% Oct 2013,  40-45% with apical infarct 10/18/12 History of ischemic cardiomyopathy remotely the EF in the 35 to 40% range however more recently echo performed 10/05/2017 revealed normal EF.  He is asymptomatic.      [name] [place], [facility] 07/20/2020

## 2020-07-20 NOTE — Assessment & Plan Note (Signed)
History of CAD status post CABG January 2013 after catheterization by myself 12/02/2011 revealed left main/three-vessel disease with moderate LV dysfunction.  He had LIMA to his LAD, vein to an OM branch and to the RCA.  He was recath after that by Dr. Claiborne Billings revealing a patent LIMA to the LAD and a patent vein to the RCA RCA.  The circumflex vein graft was occluded.  He underwent redo CABG x1 with a vein to the circumflex obtuse marginal branch and returned several times after that with recurrent angina.  Catheterization by myself revealed kink in the vein graft just prior to the distal anastomosis which I stented and restented again for in-stent restenosis 05/16/2013.  Has been asymptomatic since.

## 2020-07-20 NOTE — Assessment & Plan Note (Signed)
History of essential hypertension with blood pressure measured today 128/60.  He is on hydrochlorothiazide and carvedilol.

## 2020-07-20 NOTE — Assessment & Plan Note (Signed)
History of hyperlipidemia on statin therapy.  We will recheck a lipid liver profile this morning. 

## 2020-07-20 NOTE — Patient Instructions (Signed)
Medication Instructions:  Your physician recommends that you continue on your current medications as directed. Please refer to the Current Medication list given to you today.  *If you need a refill on your cardiac medications before your next appointment, please call your pharmacy*   Lab Work: Lipid, Hepatic today  If you have labs (blood work) drawn today and your tests are completely normal, you will receive your results only by:  [place] (if you have MyChart) OR  A paper copy in the mail If you have any lab test that is abnormal or we need to change your treatment, we will call you to review the results.  Follow-Up: At [facility], you and your health needs are our priority.  As part of our continuing mission to provide you with exceptional heart care, we have created designated Provider Care Teams.  These Care Teams include your primary Cardiologist (physician) and Advanced Practice Providers (APPs -  Physician Assistants and Nurse Practitioners) who all work together to provide you with the care you need, when you need it.  We recommend signing up for the patient portal called "MyChart".  Sign up information is provided on this After Visit Summary.  MyChart is used to connect with patients for Virtual Visits (Telemedicine).  Patients are able to view lab/test results, encounter notes, upcoming appointments, etc.  Non-urgent messages can be sent to your provider as well.   To learn more about what you can do with MyChart, go to [email or web address].    Your next appointment:   12 month(s)  The format for your next appointment:   In Person  Provider:   Quay Burow, MD

## 2021-05-02 ENCOUNTER — Encounter (HOSPITAL_BASED_OUTPATIENT_CLINIC_OR_DEPARTMENT_OTHER): Payer: Self-pay | Admitting: Urology

## 2021-05-02 ENCOUNTER — Emergency Department (HOSPITAL_BASED_OUTPATIENT_CLINIC_OR_DEPARTMENT_OTHER)
Admission: EM | Admit: 2021-05-02 | Discharge: 2021-05-02 | Disposition: A | Discharge status: Home / Self Care | Payer: No Typology Code available for payment source | Attending: Emergency Medicine | Admitting: Emergency Medicine

## 2021-05-02 ENCOUNTER — Other Ambulatory Visit: Payer: Self-pay

## 2021-05-02 DIAGNOSIS — I110 Hypertensive heart disease with heart failure: Secondary | ICD-10-CM | POA: Diagnosis not present

## 2021-05-02 DIAGNOSIS — E876 Hypokalemia: Secondary | ICD-10-CM | POA: Insufficient documentation

## 2021-05-02 DIAGNOSIS — Z951 Presence of aortocoronary bypass graft: Secondary | ICD-10-CM | POA: Diagnosis not present

## 2021-05-02 DIAGNOSIS — Z87891 Personal history of nicotine dependence: Secondary | ICD-10-CM | POA: Diagnosis not present

## 2021-05-02 DIAGNOSIS — Z79899 Other long term (current) drug therapy: Secondary | ICD-10-CM | POA: Diagnosis not present

## 2021-05-02 DIAGNOSIS — Z7902 Long term (current) use of antithrombotics/antiplatelets: Secondary | ICD-10-CM | POA: Diagnosis not present

## 2021-05-02 DIAGNOSIS — I2510 Atherosclerotic heart disease of native coronary artery without angina pectoris: Secondary | ICD-10-CM | POA: Insufficient documentation

## 2021-05-02 DIAGNOSIS — Z7982 Long term (current) use of aspirin: Secondary | ICD-10-CM | POA: Diagnosis not present

## 2021-05-02 DIAGNOSIS — I5031 Acute diastolic (congestive) heart failure: Secondary | ICD-10-CM | POA: Diagnosis not present

## 2021-05-02 DIAGNOSIS — R630 Anorexia: Secondary | ICD-10-CM | POA: Diagnosis not present

## 2021-05-02 DIAGNOSIS — Z8616 Personal history of COVID-19: Secondary | ICD-10-CM | POA: Insufficient documentation

## 2021-05-02 DIAGNOSIS — R531 Weakness: Secondary | ICD-10-CM

## 2021-05-02 LAB — URINALYSIS, ROUTINE W REFLEX MICROSCOPIC
Bilirubin Urine: NEGATIVE
Glucose, UA: 100 mg/dL — AB
Ketones, ur: NEGATIVE mg/dL
Leukocytes,Ua: NEGATIVE
Nitrite: NEGATIVE
Protein, ur: 30 mg/dL — AB
Specific Gravity, Urine: 1.02 (ref 1.005–1.030)
pH: 6.5 (ref 5.0–8.0)

## 2021-05-02 LAB — COMPREHENSIVE METABOLIC PANEL
ALT: 39 U/L (ref 0–44)
AST: 33 U/L (ref 15–41)
Albumin: 3 g/dL — ABNORMAL LOW (ref 3.5–5.0)
Alkaline Phosphatase: 71 U/L (ref 38–126)
Anion gap: 10 (ref 5–15)
BUN: 21 mg/dL (ref 8–23)
CO2: 25 mmol/L (ref 22–32)
Calcium: 8.3 mg/dL — ABNORMAL LOW (ref 8.9–10.3)
Chloride: 101 mmol/L (ref 98–111)
Creatinine, Ser: 0.91 mg/dL (ref 0.61–1.24)
GFR, Estimated: >60 mL/min (ref >60)
Glucose, Bld: 155 mg/dL — ABNORMAL HIGH (ref 70–99)
Potassium: 2.5 mmol/L — CL (ref 3.5–5.1)
Sodium: 136 mmol/L (ref 135–145)
Total Bilirubin: 0.9 mg/dL (ref 0.3–1.2)
Total Protein: 6.6 g/dL (ref 6.5–8.1)

## 2021-05-02 LAB — CBC WITH DIFFERENTIAL/PLATELET
Abs Immature Granulocytes: 0.03 10*3/uL (ref 0.00–0.07)
Basophils Absolute: 0 10*3/uL (ref 0.0–0.1)
Basophils Relative: 0 %
Eosinophils Absolute: 0 10*3/uL (ref 0.0–0.5)
Eosinophils Relative: 1 %
HCT: 39.1 % (ref 39.0–52.0)
Hemoglobin: 13.7 g/dL (ref 13.0–17.0)
Immature Granulocytes: 1 %
Lymphocytes Relative: 18 %
Lymphs Abs: 1.1 10*3/uL (ref 0.7–4.0)
MCH: 31.8 pg (ref 26.0–34.0)
MCHC: 35 g/dL (ref 30.0–36.0)
MCV: 90.7 fL (ref 80.0–100.0)
Monocytes Absolute: 0.6 10*3/uL (ref 0.1–1.0)
Monocytes Relative: 10 %
Neutro Abs: 4.5 10*3/uL (ref 1.7–7.7)
Neutrophils Relative %: 70 %
Platelets: 204 10*3/uL (ref 150–400)
RBC: 4.31 MIL/uL (ref 4.22–5.81)
RDW: 13.7 % (ref 11.5–15.5)
WBC: 6.3 10*3/uL (ref 4.0–10.5)
nRBC: 0 % (ref 0.0–0.2)

## 2021-05-02 LAB — URINALYSIS, MICROSCOPIC (REFLEX)

## 2021-05-02 LAB — MAGNESIUM: Magnesium: 1.3 mg/dL — ABNORMAL LOW (ref 1.7–2.4)

## 2021-05-02 MED ORDER — POTASSIUM CHLORIDE CRYS ER 20 MEQ PO TBCR: 20.0000 meq | EXTENDED_RELEASE_TABLET | Freq: Two times a day (BID) | ORAL | 0 refills | Status: AC

## 2021-05-02 MED ORDER — MAG-OXIDE 200 MG PO TABS: 200.0000 mg | ORAL_TABLET | Freq: Two times a day (BID) | ORAL | 0 refills | Status: AC

## 2021-05-02 MED ORDER — POTASSIUM CHLORIDE CRYS ER 20 MEQ PO TBCR
40.0000 meq | EXTENDED_RELEASE_TABLET | Freq: Once | ORAL | Status: AC
  Administered 2021-05-02: 40 meq via ORAL
  Filled 2021-05-02: qty 2

## 2021-05-02 MED ORDER — MAGNESIUM SULFATE 2 GM/50ML IV SOLN
2.0000 g | Freq: Once | INTRAVENOUS | Status: AC
  Administered 2021-05-02: 2 g via INTRAVENOUS
  Filled 2021-05-02: qty 50

## 2021-05-02 MED ORDER — POTASSIUM CHLORIDE 10 MEQ/100ML IV SOLN
10.0000 meq | INTRAVENOUS | Status: AC
  Administered 2021-05-02 (×2): 10 meq via INTRAVENOUS
  Filled 2021-05-02 (×2): qty 100

## 2021-05-02 MED ORDER — SODIUM CHLORIDE 0.9 % IV BOLUS
1000.0000 mL | Freq: Once | INTRAVENOUS | Status: AC
  Administered 2021-05-02: 1000 mL via INTRAVENOUS

## 2021-05-02 NOTE — Discharge Instructions (Addendum)
STOP your HYDROCHLOROTHIAZIDE.

## 2021-05-02 NOTE — ED Provider Notes (Signed)
[facility] DEPARTMENT Provider Note   CSN: [id number] Arrival date & time: 05/02/21  0930     History Chief Complaint  Patient presents with   Covid Positive   Weight Loss    [name]. is a 75 y.o. male.  HPI 75 year old male presents with generalized weakness, fatigue, and weight loss.  He had COVID at the beginning of this month.  He tested positive on 6/15 because he was not getting better.  He is lost an estimated 30 pounds over the month.  He is having a lot of decreased appetite and while he is trying to force himself to eat food he is not eating as much.  He feels like he is drinking enough fluids.  No vomiting or diarrhea currently.  Minimal headache right now but never has had a significant headache.  Cough seems to be better.  No chest pain or shortness of breath.  He gets very weak whenever he walks and seems to tire out easily.  It does not hurt to urinate but he has noticed increased urinary frequency since this started.  Past Medical History:  Diagnosis Date   Anginal pain ([place])    Arthritis    "in my back and neck" (Sep 25, 2012)   CAD, CABG X 09 Nov 2011. re-do CABG X1 09/19/12 12/04/2011   Coronary artery disease    DDD (degenerative disc disease), cervical    Hypercholesterolemia    Hypertension    Malaria    "I've had it a couple times in the 1960's; almost died from it" (09-25-12)   NSTEMI (non-ST elevated myocardial infarction) ([place]) 11/2011   anterior   NSTEMI (non-ST elevated myocardial infarction) ([place]) Sep 25, 2012   PTSD (post-traumatic stress disorder)    PTSD (post-traumatic stress disorder)    S/P angioplasty with stent,  10/31/12 VG to OM that was new graft from 11/13 in setting of NSTEMI 05/05/2013   Seizures ([place])    1 TIME   Vertebro basilar insufficiency 10/03/2017    Patient Active Problem List   Diagnosis Date Noted   Internal carotid aneurysm 01/30/2018   Vertebro basilar insufficiency 10/04/2017   Vertebral  artery dissection (HCC) 10/04/2017   Vertebral artery insufficiency 10/04/2017   Coronary artery disease 02/16/2014   Abnormal nuclear stress test 05/16/2013   Angina effort 05/05/2013   S/P angioplasty with stent,  10/31/12 VG to OM that was new graft from 11/13 in setting of NSTEMI, NOW 05/16/13 with New DES stent to OM VG of ostium and distal portion 05/05/2013   Stress disorder, post traumatic, from experiences in [place], controlled 05/05/2013   Stable angina ([place]) 04/22/2013   Dyspnea on exertion 03/25/2013   Anxiety as acute reaction to exceptional stress 10/30/2012    Class: Question of   Hx of agent Orange exposure, he is followed [facility] 10/24/2012   Anemia, heme negative 10/06/12 10/06/2012   Acute coronary syndrome  71/04/2693   Acute diastolic HF (heart failure) - likely cause of ACS; resolved 10/05/2012   Dyslipidemia 09/20/2012   BPH (benign prostatic hyperplasia) 09/20/2012   Nasal sinus congestion 09/13/2012   NSTEMI, 3d episode this month 09-25-2012   Abnormal EKG, marked diffuse downsloping ST depression,ST elevation AVR and V1 12/05/2011   Acute MI,( only one POC marker drawn-0.9) 12/05/2011   Former smoker 12/05/2011   HTN (hypertension) 12/05/2011   ICM, EF 40% Jan 2013,  50-55% Oct 2013,  40-45% with apical infarct 10/18/12 12/05/2011   S/P CABG x  3 11/2011: LIMA TO LAD, SVG TO OM, SVG TO RCA., VG to OM occluded, jeopardized LCX, by cath 08/29/12 -- Re-Do CABG with SVG-OM (09/2012) 12/04/2011    Past Surgical History:  Procedure Laterality Date   BACK SURGERY     CARDIAC CATHETERIZATION  11/2011   CATARACT EXTRACTION W/ INTRAOCULAR LENS  IMPLANT, BILATERAL  1990's   CORONARY ANGIOPLASTY  10/2012   stent placed SVG to OM    CORONARY ARTERY BYPASS GRAFT  12/02/2011   Procedure: CORONARY ARTERY BYPASS GRAFTING (CABG);  Surgeon: Gaye Pollack, MD;  Location: [place];  Service: Open Heart Surgery;  Laterality: N/A;   CORONARY ARTERY BYPASS GRAFT  09/19/2012    Procedure: REDO CORONARY ARTERY BYPASS GRAFTING (CABG);  Surgeon: Gaye Pollack, MD;  Location: [place];  Service: Open Heart Surgery;  Laterality: N/A;   IR ANGIO INTRA EXTRACRAN SEL COM CAROTID INNOMINATE BILAT MOD SED  10/05/2017   IR ANGIO VERTEBRAL SEL VERTEBRAL BILAT MOD SED  10/05/2017   IR INTRAVSC STENT CERV CAROTID W/O EMB-PROT MOD SED INC ANGIO  01/30/2018   IR RADIOLOGIST EVAL & MGMT  10/24/2017   LEFT HEART CATHETERIZATION WITH CORONARY ANGIOGRAM N/A 12/02/2011   Procedure: LEFT HEART CATHETERIZATION WITH CORONARY ANGIOGRAM;  Surgeon: Lorretta Harp, MD;  Location: [facility] CATH LAB;  Service: Cardiovascular;  Laterality: N/A;   LEFT HEART CATHETERIZATION WITH CORONARY ANGIOGRAM N/A 08/30/2012   Procedure: LEFT HEART CATHETERIZATION WITH CORONARY ANGIOGRAM;  Surgeon: Troy Sine, MD;  Location: [facility] CATH LAB;  Service: Cardiovascular;  Laterality: N/A;   LEFT HEART CATHETERIZATION WITH CORONARY ANGIOGRAM N/A 09/11/2012   Procedure: LEFT HEART CATHETERIZATION WITH CORONARY ANGIOGRAM;  Surgeon: Troy Sine, MD;  Location: [facility] CATH LAB;  Service: Cardiovascular;  Laterality: N/A;   LEFT HEART CATHETERIZATION WITH CORONARY ANGIOGRAM N/A 05/16/2013   Procedure: LEFT HEART CATHETERIZATION WITH CORONARY ANGIOGRAM;  Surgeon: Lorretta Harp, MD;  Location: [facility] CATH LAB;  Service: Cardiovascular;  Laterality: N/A;   LEFT HEART CATHETERIZATION WITH CORONARY/GRAFT ANGIOGRAM  08/30/2012   Procedure: LEFT HEART CATHETERIZATION WITH Beatrix Fetters;  Surgeon: Troy Sine, MD;  Location: [facility] CATH LAB;  Service: Cardiovascular;;   LEFT HEART CATHETERIZATION WITH CORONARY/GRAFT ANGIOGRAM N/A 10/04/2012   Procedure: LEFT HEART CATHETERIZATION WITH Beatrix Fetters;  Surgeon: Leonie Man, MD;  Location: [facility] CATH LAB;  Service: Cardiovascular;  Laterality: N/A;   LEFT HEART CATHETERIZATION WITH CORONARY/GRAFT ANGIOGRAM N/A 10/17/2012   Procedure: LEFT HEART CATHETERIZATION WITH  Beatrix Fetters;  Surgeon: Leonie Man, MD;  Location: [facility] CATH LAB;  Service: Cardiovascular;  Laterality: N/A;   LUMBAR DISC SURGERY  1980's?   PERCUTANEOUS CORONARY STENT INTERVENTION (PCI-S) N/A 10/31/2012   Procedure: PERCUTANEOUS CORONARY STENT INTERVENTION (PCI-S);  Surgeon: Lorretta Harp, MD;  Location: [facility] CATH LAB;  Service: Cardiovascular;  Laterality: N/A;   RADIOLOGY WITH ANESTHESIA N/A 12/03/2017   Procedure: embolization;  Surgeon: Luanne Bras, MD;  Location: [place];  Service: Radiology;  Laterality: N/A;   RADIOLOGY WITH ANESTHESIA N/A 01/30/2018   Procedure: EMBOLIZATION;  Surgeon: Luanne Bras, MD;  Location: [facility];  Service: Radiology;  Laterality: N/A;   TONSILLECTOMY     "as a kid" (08/29/2012)   TRANSTHORACIC ECHOCARDIOGRAM  10/18/2012   LV EF 40%- 45%    US ECHOCARDIOGRAPHY  02/29/2012       Family History  Problem Relation Age of Onset   Heart disease Mother    Diabetes Mother    Heart disease Father  Diabetes Father    Healthy Sister    Diabetes Brother    Diabetes Sister    Diabetes Brother     Social History   Tobacco Use   Smoking status: Former    Packs/day: 0.50    Years: 50.00    Pack years: 25.00    Types: Cigarettes    Quit date: 12/02/2011    Years since quitting: 9.4   Smokeless tobacco: Never   Tobacco comments:    NIC PATCH  Vaping Use   Vaping Use: Never used  Substance Use Topics   Alcohol use: Yes    Comment: RARE   Drug use: No    Home Medications Prior to Admission medications   Medication Sig Start Date End Date Taking? Authorizing Provider  Magnesium Oxide (MAG-OXIDE) 200 MG TABS Take 1 tablet (200 mg total) by mouth in the morning and at bedtime for 5 days. 05/02/21 05/07/21 Yes Sherwood Gambler, MD  potassium chloride SA (KLOR-CON) 20 MEQ tablet Take 1 tablet (20 mEq total) by mouth 2 (two) times daily for 5 days. 05/02/21 05/07/21 Yes Sherwood Gambler, MD  amLODipine (NORVASC) 10 MG tablet Take 10 mg  by mouth daily.    [provider]  aspirin EC 81 MG tablet Take 81 mg by mouth daily. Swallow whole.    [provider]  atorvastatin (LIPITOR) 80 MG tablet Take 1 tablet (80 mg total) by mouth daily. 10/10/17   Lorretta Harp, MD  carvedilol (COREG) 3.125 MG tablet Take 3.125 mg by mouth 2 (two) times daily with a meal.    [provider]  clopidogrel (PLAVIX) 75 MG tablet Take 1 tablet (75 mg total) by mouth daily. 01/31/18   Ascencion Dike, PA-C  ferrous sulfate 325 (65 FE) MG tablet Take 1 tablet (325 mg total) by mouth 3 (three) times daily with meals. Patient taking differently: Take 325 mg by mouth 2 (two) times daily with a meal.  10/25/12   Kilroy, Doreene Burke, PA-C  finasteride (PROSCAR) 5 MG tablet Take 5 mg by mouth daily.    [provider]  ibuprofen (ADVIL,MOTRIN) 200 MG tablet Take 400 mg by mouth every 6 (six) hours as needed for mild pain.    [provider]  isosorbide mononitrate (IMDUR) 30 MG 24 hr tablet Take 1 tablet (30 mg total) by mouth daily. 04/23/13   Croitoru, Mihai, MD  lidocaine (LIDODERM) 5 % Place 1 patch onto the skin daily as needed (for pain). Remove & Discard patch within 12 hours or as directed by MD     [provider]  nitroGLYCERIN (NITROSTAT) 0.4 MG SL tablet Place 1 tablet (0.4 mg total) under the tongue every 5 (five) minutes x 3 doses as needed for chest pain. 10/07/12   Brett Canales, PA-C  omeprazole (PRILOSEC) 20 MG capsule Take 20 mg by mouth 2 (two) times daily.    [provider]  terazosin (HYTRIN) 10 MG capsule Take 10 mg by mouth at bedtime.    [provider]    Allergies    Patient has no known allergies.  Review of Systems   Review of Systems  Constitutional:  Positive for fatigue. Negative for fever.  Respiratory:  Negative for shortness of breath.   Cardiovascular:  Negative for chest pain.  Gastrointestinal:  Negative for abdominal pain.  Genitourinary:  Positive  for frequency. Negative for dysuria.  Neurological:  Positive for weakness and headaches.  All other systems reviewed and are negative.  Physical Exam Updated Vital Signs BP 140/67 (BP Location: Right Arm)   Pulse (!) 50   Temp (!) 97.5 F (36.4 C) (Oral)   Resp 16   Ht 5\' 10"  (1.778 m)   Wt 73.1 kg   SpO2 100%   BMI 23.12 kg/m   Physical Exam Vitals and nursing note reviewed.  Constitutional:      General: He is not in acute distress.    Appearance: He is well-developed. He is not ill-appearing or diaphoretic.  HENT:     Head: Normocephalic and atraumatic.     Right Ear: External ear normal.     Left Ear: External ear normal.     Nose: Nose normal.     Mouth/Throat:     Mouth: Mucous membranes are dry.  Eyes:     General:        Right eye: No discharge.        Left eye: No discharge.     Extraocular Movements: Extraocular movements intact.     Pupils: Pupils are equal, round, and reactive to light.  Cardiovascular:     Rate and Rhythm: Normal rate and regular rhythm.     Heart sounds: Normal heart sounds.  Pulmonary:     Effort: Pulmonary effort is normal.     Breath sounds: Normal breath sounds.  Abdominal:     Palpations: Abdomen is soft.     Tenderness: There is no abdominal tenderness.  Musculoskeletal:     Cervical back: Neck supple.  Skin:    General: Skin is warm and dry.  Neurological:     Mental Status: He is alert.     Comments: CN 3-12 grossly intact. 5/5 strength in all 4 extremities. Grossly normal sensation. Normal finger to nose.   Psychiatric:        Mood and Affect: Mood is not anxious.    ED Results / Procedures / Treatments   Labs (all labs ordered are listed, but only abnormal results are displayed) Labs Reviewed  COMPREHENSIVE METABOLIC PANEL - Abnormal; Notable for the following components:      Result Value   Potassium 2.5 (*)    Glucose, Bld 155 (*)    Calcium 8.3 (*)    Albumin 3.0 (*)    All other components within normal  limits  URINALYSIS, ROUTINE W REFLEX MICROSCOPIC - Abnormal; Notable for the following components:   Color, Urine ORANGE (*)    Glucose, UA 100 (*)    Hgb urine dipstick MODERATE (*)    Protein, ur 30 (*)    All other components within normal limits  MAGNESIUM - Abnormal; Notable for the following components:   Magnesium 1.3 (*)    All other components within normal limits  URINALYSIS, MICROSCOPIC (REFLEX) - Abnormal; Notable for the following components:   Bacteria, UA RARE (*)    All other components within normal limits  CBC WITH DIFFERENTIAL/PLATELET    EKG EKG Interpretation  Date/Time:  Monday May 02 2021 11:08:23 EDT Ventricular Rate:  55 PR Interval:  105 QRS Duration: 118 QT Interval:  464 QTC Calculation: 444 R Axis:   45 Text Interpretation: Sinus rhythm Short PR interval Nonspecific intraventricular conduction delay  nonspecific ST changes similar to 2018 Confirmed by Sherwood Gambler [id number]) on 05/02/2021 11:12:59 AM  Radiology No results found.  Procedures Procedures   Medications Ordered in ED Medications  potassium chloride SA (KLOR-CON) CR tablet 40 mEq (40 mEq Oral Given 05/02/21 1133)  potassium chloride 10 mEq  in 100 mL IVPB (0 mEq Intravenous Stopped 05/02/21 1353)  sodium chloride 0.9 % bolus 1,000 mL (0 mLs Intravenous Stopped 05/02/21 1312)  magnesium sulfate IVPB 2 g 50 mL (0 g Intravenous Stopped 05/02/21 1258)    ED Course  I have reviewed the triage vital signs and the nursing notes.  Pertinent labs & imaging results that were available during my care of the patient were reviewed by me and considered in my medical decision making (see chart for details).    MDM Rules/Calculators/A&P                          Patient presents with weight loss and generalized weakness.  It seems that his decreased p.o. intake is likely contributing to the hyponatremia in combination with him being on HCTZ.  He has concomitant hypomagnesemia.  Otherwise, he has  normal strength and neuro testing.  He was given fluids, potassium and magnesium replacement.  We discussed stopping the HCTZ and being given electrolyte supplementation.  Otherwise, he feels well enough for discharge home.  No emergent ECG findings with the electrolyte disturbances.  We discussed trying supplements like Ensure and following up with his PCP. Final Clinical Impression(s) / ED Diagnoses Final diagnoses:  Hypokalemia  Hypomagnesemia  Generalized weakness    Rx / DC Orders ED Discharge Orders          Ordered    Magnesium Oxide (MAG-OXIDE) 200 MG TABS  2 times daily        05/02/21 1342    potassium chloride SA (KLOR-CON) 20 MEQ tablet  2 times daily        05/02/21 1342             Sherwood Gambler, MD 05/02/21 1418

## 2021-05-02 NOTE — ED Triage Notes (Signed)
Positive Covid test on 04/20/21, states weight loss of 35 lbs this month, states weakness, no appetite, denies n/v.

## 2021-05-02 NOTE — ED Notes (Signed)
Patient reports weight loss/loss of appetite/ fatigue over last month since diagnosis of covid on 6-15.  Patient reports he normally weighs 180 lbs.  Denies cough, shortness of breath, nausea, vomiting, diarrhea.
